# Patient Record
Sex: Male | Born: 1935 | Race: White | Hispanic: No | Marital: Married | Smoking: Former smoker
Health system: Southern US, Community
[De-identification: ages and names within clinical notes are randomized; demographics above are authoritative.]

## PROBLEM LIST (undated history)

## (undated) DIAGNOSIS — I2699 Other pulmonary embolism without acute cor pulmonale: Secondary | ICD-10-CM

## (undated) DIAGNOSIS — J449 Chronic obstructive pulmonary disease, unspecified: Secondary | ICD-10-CM

## (undated) DIAGNOSIS — E119 Type 2 diabetes mellitus without complications: Secondary | ICD-10-CM

## (undated) DIAGNOSIS — I2510 Atherosclerotic heart disease of native coronary artery without angina pectoris: Secondary | ICD-10-CM

## (undated) DIAGNOSIS — I82409 Acute embolism and thrombosis of unspecified deep veins of unspecified lower extremity: Secondary | ICD-10-CM

## (undated) DIAGNOSIS — N289 Disorder of kidney and ureter, unspecified: Secondary | ICD-10-CM

## (undated) DIAGNOSIS — E785 Hyperlipidemia, unspecified: Secondary | ICD-10-CM

## (undated) DIAGNOSIS — I10 Essential (primary) hypertension: Secondary | ICD-10-CM

## (undated) DIAGNOSIS — E663 Overweight: Secondary | ICD-10-CM

## (undated) DIAGNOSIS — R413 Other amnesia: Secondary | ICD-10-CM

## (undated) DIAGNOSIS — K219 Gastro-esophageal reflux disease without esophagitis: Secondary | ICD-10-CM

## (undated) DIAGNOSIS — R42 Dizziness and giddiness: Secondary | ICD-10-CM

## (undated) DIAGNOSIS — C3400 Malignant neoplasm of unspecified main bronchus: Secondary | ICD-10-CM

## (undated) DIAGNOSIS — E538 Deficiency of other specified B group vitamins: Secondary | ICD-10-CM

## (undated) DIAGNOSIS — Z9289 Personal history of other medical treatment: Secondary | ICD-10-CM

## (undated) HISTORY — DX: Overweight: E66.3

## (undated) HISTORY — PX: CORONARY ANGIOPLASTY WITH STENT PLACEMENT: SHX49

## (undated) HISTORY — DX: Atherosclerotic heart disease of native coronary artery without angina pectoris: I25.10

## (undated) HISTORY — DX: Malignant neoplasm of unspecified main bronchus: C34.00

## (undated) HISTORY — DX: Gastro-esophageal reflux disease without esophagitis: K21.9

## (undated) HISTORY — PX: CATARACT EXTRACTION W/ INTRAOCULAR LENS  IMPLANT, BILATERAL: SHX1307

## (undated) HISTORY — DX: Other amnesia: R41.3

## (undated) HISTORY — DX: Deficiency of other specified B group vitamins: E53.8

## (undated) HISTORY — DX: Hyperlipidemia, unspecified: E78.5

## (undated) HISTORY — DX: Essential (primary) hypertension: I10

## (undated) HISTORY — DX: Disorder of kidney and ureter, unspecified: N28.9

## (undated) HISTORY — DX: Chronic obstructive pulmonary disease, unspecified: J44.9

## (undated) HISTORY — DX: Dizziness and giddiness: R42

## (undated) SURGERY — EGD (ESOPHAGOGASTRODUODENOSCOPY)
Anesthesia: Moderate Sedation | Laterality: Left

---

## 1998-01-13 ENCOUNTER — Ambulatory Visit (HOSPITAL_COMMUNITY): Admission: RE | Admit: 1998-01-13 | Discharge: 1998-01-13 | Payer: Self-pay | Admitting: Family Medicine

## 2001-11-09 ENCOUNTER — Encounter: Payer: Self-pay | Admitting: Interventional Cardiology

## 2001-11-10 ENCOUNTER — Inpatient Hospital Stay (HOSPITAL_COMMUNITY): Admission: AD | Admit: 2001-11-10 | Discharge: 2001-11-11 | Payer: Self-pay | Admitting: Interventional Cardiology

## 2002-07-20 ENCOUNTER — Ambulatory Visit (HOSPITAL_COMMUNITY): Admission: RE | Admit: 2002-07-20 | Discharge: 2002-07-20 | Payer: Self-pay | Admitting: Interventional Cardiology

## 2004-09-11 ENCOUNTER — Ambulatory Visit: Payer: Self-pay | Admitting: Internal Medicine

## 2004-09-11 ENCOUNTER — Encounter: Payer: Self-pay | Admitting: Endocrinology

## 2004-09-11 LAB — CONVERTED CEMR LAB: PSA: 3.94 ng/mL

## 2004-09-13 ENCOUNTER — Ambulatory Visit (HOSPITAL_COMMUNITY): Admission: RE | Admit: 2004-09-13 | Discharge: 2004-09-13 | Payer: Self-pay | Admitting: Internal Medicine

## 2004-09-13 ENCOUNTER — Ambulatory Visit: Payer: Self-pay | Admitting: Pulmonary Disease

## 2004-10-09 ENCOUNTER — Ambulatory Visit: Payer: Self-pay | Admitting: Internal Medicine

## 2004-10-16 ENCOUNTER — Encounter: Admission: RE | Admit: 2004-10-16 | Discharge: 2005-01-14 | Payer: Self-pay | Admitting: Internal Medicine

## 2005-02-05 ENCOUNTER — Ambulatory Visit: Payer: Self-pay | Admitting: Internal Medicine

## 2005-02-12 ENCOUNTER — Ambulatory Visit: Payer: Self-pay | Admitting: Internal Medicine

## 2005-04-30 ENCOUNTER — Ambulatory Visit: Payer: Self-pay | Admitting: Internal Medicine

## 2005-05-09 ENCOUNTER — Ambulatory Visit: Payer: Self-pay | Admitting: Internal Medicine

## 2005-08-15 ENCOUNTER — Ambulatory Visit: Payer: Self-pay | Admitting: Internal Medicine

## 2006-01-10 ENCOUNTER — Ambulatory Visit: Payer: Self-pay | Admitting: Internal Medicine

## 2006-02-13 ENCOUNTER — Ambulatory Visit: Payer: Self-pay | Admitting: Internal Medicine

## 2006-03-27 ENCOUNTER — Ambulatory Visit: Payer: Self-pay | Admitting: Internal Medicine

## 2006-04-30 ENCOUNTER — Ambulatory Visit: Payer: Self-pay | Admitting: Internal Medicine

## 2006-06-24 ENCOUNTER — Ambulatory Visit: Payer: Self-pay | Admitting: Internal Medicine

## 2006-08-06 ENCOUNTER — Ambulatory Visit: Payer: Self-pay | Admitting: Internal Medicine

## 2006-08-06 LAB — CONVERTED CEMR LAB
ALT: 18 units/L (ref 0–40)
AST: 23 units/L (ref 0–37)
Alkaline Phosphatase: 78 units/L (ref 39–117)
BUN: 16 mg/dL (ref 6–23)
CO2: 28 meq/L (ref 19–32)
Calcium: 9.5 mg/dL (ref 8.4–10.5)
Chloride: 105 meq/L (ref 96–112)
Cholesterol: 252 mg/dL (ref 0–200)
Creatinine, Ser: 1.7 mg/dL — ABNORMAL HIGH (ref 0.4–1.5)
Direct LDL: 186.8 mg/dL
GFR calc Af Amer: 51 mL/min
GFR calc non Af Amer: 42 mL/min
Glucose, Bld: 123 mg/dL — ABNORMAL HIGH (ref 70–99)
HDL: 36.6 mg/dL — ABNORMAL LOW (ref >39.0)
Hgb A1c MFr Bld: 6.5 % — ABNORMAL HIGH (ref 4.6–6.0)
Potassium: 4.5 meq/L (ref 3.5–5.1)
Sodium: 140 meq/L (ref 135–145)
Total CHOL/HDL Ratio: 6.9
Triglycerides: 150 mg/dL — ABNORMAL HIGH (ref 0–149)
VLDL: 30 mg/dL (ref 0–40)

## 2006-11-06 ENCOUNTER — Ambulatory Visit: Payer: Self-pay | Admitting: Internal Medicine

## 2006-11-06 LAB — CONVERTED CEMR LAB
ALT: 14 units/L (ref 0–40)
AST: 17 units/L (ref 0–37)
Albumin: 3.7 g/dL (ref 3.5–5.2)
Alkaline Phosphatase: 77 units/L (ref 39–117)
Bilirubin, Direct: 0.2 mg/dL (ref 0.0–0.3)
Cholesterol: 135 mg/dL (ref 0–200)
HDL: 37.4 mg/dL — ABNORMAL LOW (ref >39.0)
LDL Cholesterol: 78 mg/dL (ref 0–99)
Total Bilirubin: 0.9 mg/dL (ref 0.3–1.2)
Total CHOL/HDL Ratio: 3.6
Total Protein: 6.9 g/dL (ref 6.0–8.3)
Triglycerides: 96 mg/dL (ref 0–149)
VLDL: 19 mg/dL (ref 0–40)

## 2007-02-04 ENCOUNTER — Encounter: Payer: Self-pay | Admitting: Endocrinology

## 2007-02-04 DIAGNOSIS — I2510 Atherosclerotic heart disease of native coronary artery without angina pectoris: Secondary | ICD-10-CM

## 2007-02-04 DIAGNOSIS — J439 Emphysema, unspecified: Secondary | ICD-10-CM

## 2007-02-04 DIAGNOSIS — E785 Hyperlipidemia, unspecified: Secondary | ICD-10-CM

## 2007-02-04 DIAGNOSIS — I10 Essential (primary) hypertension: Secondary | ICD-10-CM

## 2007-02-04 DIAGNOSIS — J45909 Unspecified asthma, uncomplicated: Secondary | ICD-10-CM | POA: Insufficient documentation

## 2007-02-04 DIAGNOSIS — E119 Type 2 diabetes mellitus without complications: Secondary | ICD-10-CM

## 2007-02-04 DIAGNOSIS — N259 Disorder resulting from impaired renal tubular function, unspecified: Secondary | ICD-10-CM | POA: Insufficient documentation

## 2007-03-12 ENCOUNTER — Ambulatory Visit: Payer: Self-pay | Admitting: Internal Medicine

## 2007-03-12 LAB — CONVERTED CEMR LAB
Calcium: 9.3 mg/dL (ref 8.4–10.5)
Cholesterol: 140 mg/dL (ref 0–200)
Eosinophils Absolute: 0.4 10*3/uL (ref 0.0–0.6)
Eosinophils Relative: 5 % (ref 0.0–5.0)
GFR calc Af Amer: 48 mL/min
GFR calc non Af Amer: 40 mL/min
Glucose, Bld: 126 mg/dL — ABNORMAL HIGH (ref 70–99)
HDL: 33.9 mg/dL — ABNORMAL LOW (ref >39.0)
Lymphocytes Relative: 19.3 % (ref 12.0–46.0)
MCV: 95.2 fL (ref 78.0–100.0)
Microalb Creat Ratio: 9.2 mg/g (ref 0.0–30.0)
Neutro Abs: 5.2 10*3/uL (ref 1.4–7.7)
Platelets: 217 10*3/uL (ref 150–400)
Sodium: 144 meq/L (ref 135–145)
TSH: 1.11 microintl units/mL (ref 0.35–5.50)
Triglycerides: 126 mg/dL (ref 0–149)
WBC: 7.5 10*3/uL (ref 4.5–10.5)

## 2007-03-25 ENCOUNTER — Ambulatory Visit: Payer: Self-pay | Admitting: Cardiology

## 2007-04-02 ENCOUNTER — Ambulatory Visit: Payer: Self-pay

## 2007-04-13 ENCOUNTER — Ambulatory Visit: Payer: Self-pay | Admitting: Internal Medicine

## 2007-05-19 ENCOUNTER — Inpatient Hospital Stay (HOSPITAL_COMMUNITY): Admission: RE | Admit: 2007-05-19 | Discharge: 2007-05-21 | Payer: Self-pay | Admitting: Surgery

## 2007-05-27 ENCOUNTER — Encounter: Payer: Self-pay | Admitting: Internal Medicine

## 2007-07-20 ENCOUNTER — Ambulatory Visit: Payer: Self-pay | Admitting: Internal Medicine

## 2007-07-20 DIAGNOSIS — R42 Dizziness and giddiness: Secondary | ICD-10-CM | POA: Insufficient documentation

## 2007-07-25 ENCOUNTER — Encounter: Payer: Self-pay | Admitting: Internal Medicine

## 2007-07-28 ENCOUNTER — Telehealth: Payer: Self-pay | Admitting: Internal Medicine

## 2007-08-03 ENCOUNTER — Encounter: Admission: RE | Admit: 2007-08-03 | Discharge: 2007-08-03 | Payer: Self-pay | Admitting: Internal Medicine

## 2007-08-06 ENCOUNTER — Encounter: Admission: RE | Admit: 2007-08-06 | Discharge: 2007-08-25 | Payer: Self-pay | Admitting: Internal Medicine

## 2007-08-11 ENCOUNTER — Ambulatory Visit: Payer: Self-pay | Admitting: Internal Medicine

## 2007-08-19 ENCOUNTER — Encounter: Payer: Self-pay | Admitting: Internal Medicine

## 2007-08-20 ENCOUNTER — Encounter: Payer: Self-pay | Admitting: Internal Medicine

## 2007-08-24 ENCOUNTER — Encounter: Payer: Self-pay | Admitting: Internal Medicine

## 2007-10-12 ENCOUNTER — Encounter: Payer: Self-pay | Admitting: Internal Medicine

## 2007-10-27 ENCOUNTER — Encounter: Payer: Self-pay | Admitting: Internal Medicine

## 2007-12-09 ENCOUNTER — Ambulatory Visit: Payer: Self-pay | Admitting: Internal Medicine

## 2007-12-09 LAB — CONVERTED CEMR LAB
BUN: 27 mg/dL — ABNORMAL HIGH (ref 6–23)
Calcium: 10 mg/dL (ref 8.4–10.5)
Cholesterol: 160 mg/dL (ref 0–200)
Creatinine, Ser: 1.7 mg/dL — ABNORMAL HIGH (ref 0.4–1.5)
GFR calc Af Amer: 51 mL/min
Glucose, Bld: 149 mg/dL — ABNORMAL HIGH (ref 70–99)
HDL: 38.5 mg/dL — ABNORMAL LOW (ref >39.0)
Triglycerides: 127 mg/dL (ref 0–149)
VLDL: 25 mg/dL (ref 0–40)

## 2007-12-15 ENCOUNTER — Ambulatory Visit: Payer: Self-pay | Admitting: Internal Medicine

## 2007-12-24 ENCOUNTER — Encounter: Payer: Self-pay | Admitting: Internal Medicine

## 2007-12-25 ENCOUNTER — Telehealth (INDEPENDENT_AMBULATORY_CARE_PROVIDER_SITE_OTHER): Payer: Self-pay | Admitting: *Deleted

## 2008-01-06 ENCOUNTER — Encounter: Payer: Self-pay | Admitting: Internal Medicine

## 2008-01-28 ENCOUNTER — Ambulatory Visit: Payer: Self-pay | Admitting: Internal Medicine

## 2008-01-28 DIAGNOSIS — R413 Other amnesia: Secondary | ICD-10-CM

## 2008-01-29 ENCOUNTER — Ambulatory Visit: Payer: Self-pay | Admitting: Internal Medicine

## 2008-02-01 ENCOUNTER — Telehealth: Payer: Self-pay | Admitting: Internal Medicine

## 2008-02-03 ENCOUNTER — Encounter: Payer: Self-pay | Admitting: Internal Medicine

## 2008-02-08 ENCOUNTER — Telehealth: Payer: Self-pay | Admitting: Internal Medicine

## 2008-02-17 ENCOUNTER — Ambulatory Visit: Payer: Self-pay | Admitting: Internal Medicine

## 2008-02-17 DIAGNOSIS — D510 Vitamin B12 deficiency anemia due to intrinsic factor deficiency: Secondary | ICD-10-CM

## 2008-02-24 ENCOUNTER — Ambulatory Visit: Payer: Self-pay | Admitting: Internal Medicine

## 2008-02-24 LAB — CONVERTED CEMR LAB
Chloride: 115 meq/L — ABNORMAL HIGH (ref 96–112)
GFR calc Af Amer: 51 mL/min
GFR calc non Af Amer: 42 mL/min
Potassium: 4.8 meq/L (ref 3.5–5.1)

## 2008-02-25 ENCOUNTER — Telehealth: Payer: Self-pay | Admitting: Internal Medicine

## 2008-02-29 ENCOUNTER — Ambulatory Visit: Payer: Self-pay | Admitting: Internal Medicine

## 2008-03-09 ENCOUNTER — Ambulatory Visit: Payer: Self-pay | Admitting: Internal Medicine

## 2008-03-10 ENCOUNTER — Telehealth: Payer: Self-pay | Admitting: Internal Medicine

## 2008-03-15 ENCOUNTER — Ambulatory Visit: Payer: Self-pay | Admitting: Internal Medicine

## 2008-03-15 LAB — CONVERTED CEMR LAB
CO2: 24 meq/L (ref 19–32)
Calcium: 9.5 mg/dL (ref 8.4–10.5)
Chloride: 116 meq/L — ABNORMAL HIGH (ref 96–112)
Creatinine,U: 166.9 mg/dL
GFR calc non Af Amer: 40 mL/min
Microalb, Ur: 0.6 mg/dL (ref 0.0–1.9)
Sodium: 142 meq/L (ref 135–145)

## 2008-03-16 ENCOUNTER — Telehealth: Payer: Self-pay | Admitting: Internal Medicine

## 2008-03-16 DIAGNOSIS — E875 Hyperkalemia: Secondary | ICD-10-CM

## 2008-03-22 ENCOUNTER — Ambulatory Visit: Payer: Self-pay | Admitting: Internal Medicine

## 2008-03-22 ENCOUNTER — Telehealth: Payer: Self-pay | Admitting: Internal Medicine

## 2008-03-22 LAB — CONVERTED CEMR LAB
Calcium: 9.5 mg/dL (ref 8.4–10.5)
Creatinine, Ser: 1.8 mg/dL — ABNORMAL HIGH (ref 0.4–1.5)
GFR calc Af Amer: 48 mL/min
Glucose, Bld: 143 mg/dL — ABNORMAL HIGH (ref 70–99)
Sodium: 142 meq/L (ref 135–145)

## 2008-04-06 ENCOUNTER — Ambulatory Visit: Payer: Self-pay | Admitting: Cardiology

## 2008-04-06 LAB — CONVERTED CEMR LAB
ALT: 35 units/L (ref 0–53)
AST: 28 units/L (ref 0–37)
Alkaline Phosphatase: 89 units/L (ref 39–117)
Bilirubin, Direct: 0.2 mg/dL (ref 0.0–0.3)
Direct LDL: 85.4 mg/dL
Total Bilirubin: 0.8 mg/dL (ref 0.3–1.2)
Total CHOL/HDL Ratio: 3.5
Triglycerides: 227 mg/dL (ref 0–149)

## 2008-04-19 ENCOUNTER — Ambulatory Visit: Payer: Self-pay | Admitting: Internal Medicine

## 2008-04-21 ENCOUNTER — Ambulatory Visit: Payer: Self-pay | Admitting: Cardiology

## 2008-04-21 LAB — CONVERTED CEMR LAB
HDL: 38.5 mg/dL — ABNORMAL LOW (ref >39.0)
LDL Cholesterol: 83 mg/dL (ref 0–99)
Total CHOL/HDL Ratio: 3.7
VLDL: 20 mg/dL (ref 0–40)

## 2008-06-28 ENCOUNTER — Encounter: Payer: Self-pay | Admitting: Internal Medicine

## 2008-07-11 ENCOUNTER — Ambulatory Visit: Payer: Self-pay | Admitting: Internal Medicine

## 2008-07-11 LAB — CONVERTED CEMR LAB
ALT: 20 units/L (ref 0–53)
BUN: 30 mg/dL — ABNORMAL HIGH (ref 6–23)
CO2: 25 meq/L (ref 19–32)
Chloride: 113 meq/L — ABNORMAL HIGH (ref 96–112)
Cholesterol: 153 mg/dL (ref 0–200)
Creatinine, Ser: 1.8 mg/dL — ABNORMAL HIGH (ref 0.4–1.5)
Glucose, Bld: 128 mg/dL — ABNORMAL HIGH (ref 70–99)
Potassium: 4.7 meq/L (ref 3.5–5.1)
Triglycerides: 109 mg/dL (ref 0–149)

## 2008-07-15 HISTORY — PX: INGUINAL HERNIA REPAIR: SUR1180

## 2008-07-22 ENCOUNTER — Ambulatory Visit: Payer: Self-pay | Admitting: Internal Medicine

## 2008-08-03 ENCOUNTER — Encounter: Payer: Self-pay | Admitting: Internal Medicine

## 2008-08-03 LAB — HM DIABETES EYE EXAM: HM Diabetic Eye Exam: NORMAL

## 2008-08-16 ENCOUNTER — Encounter: Payer: Self-pay | Admitting: Internal Medicine

## 2008-11-15 ENCOUNTER — Ambulatory Visit: Payer: Self-pay | Admitting: Internal Medicine

## 2008-11-15 LAB — CONVERTED CEMR LAB
Cholesterol: 149 mg/dL (ref 0–200)
LDL Cholesterol: 88 mg/dL (ref 0–99)
Triglycerides: 113 mg/dL (ref 0.0–149.0)
VLDL: 22.6 mg/dL (ref 0.0–40.0)

## 2008-11-21 ENCOUNTER — Ambulatory Visit: Payer: Self-pay | Admitting: Internal Medicine

## 2008-11-21 DIAGNOSIS — R5383 Other fatigue: Secondary | ICD-10-CM

## 2008-11-21 DIAGNOSIS — K219 Gastro-esophageal reflux disease without esophagitis: Secondary | ICD-10-CM

## 2008-11-21 DIAGNOSIS — R5381 Other malaise: Secondary | ICD-10-CM

## 2009-02-06 ENCOUNTER — Encounter (INDEPENDENT_AMBULATORY_CARE_PROVIDER_SITE_OTHER): Payer: Self-pay | Admitting: *Deleted

## 2009-03-08 ENCOUNTER — Encounter (INDEPENDENT_AMBULATORY_CARE_PROVIDER_SITE_OTHER): Payer: Self-pay | Admitting: *Deleted

## 2009-03-14 ENCOUNTER — Ambulatory Visit: Payer: Self-pay | Admitting: Internal Medicine

## 2009-03-14 LAB — CONVERTED CEMR LAB
ALT: 15 units/L (ref 0–53)
Albumin: 4.1 g/dL (ref 3.5–5.2)
Alkaline Phosphatase: 92 units/L (ref 39–117)
Basophils Relative: 1 % (ref 0–1)
Eosinophils Absolute: 0.3 10*3/uL (ref 0.0–0.7)
Eosinophils Relative: 4 % (ref 0–5)
HCT: 41.8 % (ref 39.0–52.0)
Indirect Bilirubin: 0.5 mg/dL (ref 0.0–0.9)
Lymphs Abs: 1.3 10*3/uL (ref 0.7–4.0)
MCHC: 33.7 g/dL (ref 30.0–36.0)
MCV: 92.5 fL (ref 78.0–100.0)
Monocytes Relative: 11 % (ref 3–12)
Neutrophils Relative %: 65 % (ref 43–77)
Platelets: 179 10*3/uL (ref 150–400)
Total Protein: 6.9 g/dL (ref 6.0–8.3)
WBC: 6.7 10*3/uL (ref 4.0–10.5)

## 2009-03-24 ENCOUNTER — Ambulatory Visit: Payer: Self-pay | Admitting: Internal Medicine

## 2009-03-28 ENCOUNTER — Encounter: Payer: Self-pay | Admitting: Internal Medicine

## 2009-05-26 ENCOUNTER — Telehealth: Payer: Self-pay | Admitting: Internal Medicine

## 2009-07-18 ENCOUNTER — Ambulatory Visit: Payer: Self-pay | Admitting: Internal Medicine

## 2009-07-18 LAB — CONVERTED CEMR LAB
AST: 15 units/L (ref 0–37)
Albumin: 4 g/dL (ref 3.5–5.2)
Alkaline Phosphatase: 103 units/L (ref 39–117)
BUN: 23 mg/dL (ref 6–23)
Bilirubin, Direct: 0.1 mg/dL (ref 0.0–0.3)
CO2: 20 meq/L (ref 19–32)
Calcium: 9.6 mg/dL (ref 8.4–10.5)
Chloride: 111 meq/L (ref 96–112)
Creatinine, Ser: 1.65 mg/dL — ABNORMAL HIGH (ref 0.40–1.50)
Glucose, Bld: 114 mg/dL — ABNORMAL HIGH (ref 70–99)
Indirect Bilirubin: 0.5 mg/dL (ref 0.0–0.9)
LDL Cholesterol: 100 mg/dL — ABNORMAL HIGH (ref 0–99)
Total Bilirubin: 0.6 mg/dL (ref 0.3–1.2)

## 2009-08-01 ENCOUNTER — Ambulatory Visit: Payer: Self-pay | Admitting: Internal Medicine

## 2009-08-02 ENCOUNTER — Encounter: Payer: Self-pay | Admitting: Cardiology

## 2009-08-02 ENCOUNTER — Ambulatory Visit: Payer: Self-pay | Admitting: Cardiology

## 2009-08-02 DIAGNOSIS — R0602 Shortness of breath: Secondary | ICD-10-CM

## 2009-11-02 ENCOUNTER — Encounter: Payer: Self-pay | Admitting: Internal Medicine

## 2009-11-02 LAB — CONVERTED CEMR LAB
Chloride: 110 meq/L (ref 96–112)
Creatinine, Ser: 2.01 mg/dL — ABNORMAL HIGH (ref 0.40–1.50)
Creatinine, Urine: 191.4 mg/dL
Hgb A1c MFr Bld: 7.1 % — ABNORMAL HIGH (ref <5.7)
Microalb Creat Ratio: 3.9 mg/g (ref 0.0–30.0)
Potassium: 4.8 meq/L (ref 3.5–5.3)
Sodium: 139 meq/L (ref 135–145)

## 2009-11-05 ENCOUNTER — Telehealth: Payer: Self-pay | Admitting: Internal Medicine

## 2009-11-09 ENCOUNTER — Ambulatory Visit: Payer: Self-pay | Admitting: Internal Medicine

## 2009-11-09 ENCOUNTER — Ambulatory Visit: Payer: Self-pay | Admitting: Radiology

## 2009-11-09 ENCOUNTER — Ambulatory Visit (HOSPITAL_BASED_OUTPATIENT_CLINIC_OR_DEPARTMENT_OTHER): Admission: RE | Admit: 2009-11-09 | Discharge: 2009-11-09 | Payer: Self-pay | Admitting: Internal Medicine

## 2009-11-10 ENCOUNTER — Telehealth: Payer: Self-pay | Admitting: Internal Medicine

## 2009-11-13 ENCOUNTER — Encounter (INDEPENDENT_AMBULATORY_CARE_PROVIDER_SITE_OTHER): Payer: Self-pay | Admitting: *Deleted

## 2009-11-23 ENCOUNTER — Encounter: Payer: Self-pay | Admitting: Internal Medicine

## 2009-11-23 LAB — CONVERTED CEMR LAB
CO2: 27 meq/L (ref 19–32)
Calcium: 9.3 mg/dL (ref 8.4–10.5)
Potassium: 4.9 meq/L (ref 3.5–5.3)
Sodium: 140 meq/L (ref 135–145)

## 2009-11-24 ENCOUNTER — Encounter: Payer: Self-pay | Admitting: Internal Medicine

## 2009-12-07 ENCOUNTER — Encounter: Payer: Self-pay | Admitting: Internal Medicine

## 2009-12-07 ENCOUNTER — Ambulatory Visit: Payer: Self-pay

## 2009-12-13 ENCOUNTER — Telehealth: Payer: Self-pay | Admitting: Internal Medicine

## 2010-01-30 LAB — CONVERTED CEMR LAB
CO2: 25 meq/L (ref 19–32)
Chloride: 104 meq/L (ref 96–112)
Creatinine, Ser: 1.83 mg/dL — ABNORMAL HIGH (ref 0.40–1.50)
Glucose, Bld: 129 mg/dL — ABNORMAL HIGH (ref 70–99)

## 2010-02-09 ENCOUNTER — Ambulatory Visit: Payer: Self-pay | Admitting: Internal Medicine

## 2010-02-13 ENCOUNTER — Telehealth: Payer: Self-pay | Admitting: Internal Medicine

## 2010-02-27 ENCOUNTER — Telehealth: Payer: Self-pay | Admitting: Internal Medicine

## 2010-03-02 ENCOUNTER — Encounter: Payer: Self-pay | Admitting: Internal Medicine

## 2010-03-05 ENCOUNTER — Encounter: Payer: Self-pay | Admitting: Internal Medicine

## 2010-03-09 ENCOUNTER — Ambulatory Visit: Payer: Self-pay | Admitting: Internal Medicine

## 2010-03-22 ENCOUNTER — Ambulatory Visit: Payer: Self-pay | Admitting: Cardiology

## 2010-03-22 ENCOUNTER — Ambulatory Visit: Payer: Self-pay

## 2010-03-22 ENCOUNTER — Encounter: Payer: Self-pay | Admitting: Internal Medicine

## 2010-03-22 ENCOUNTER — Ambulatory Visit (HOSPITAL_COMMUNITY): Admission: RE | Admit: 2010-03-22 | Discharge: 2010-03-22 | Payer: Self-pay | Admitting: Internal Medicine

## 2010-04-13 ENCOUNTER — Ambulatory Visit: Payer: Self-pay | Admitting: Internal Medicine

## 2010-04-16 ENCOUNTER — Telehealth: Payer: Self-pay | Admitting: Internal Medicine

## 2010-04-16 ENCOUNTER — Encounter: Payer: Self-pay | Admitting: Internal Medicine

## 2010-07-12 ENCOUNTER — Encounter: Payer: Self-pay | Admitting: Internal Medicine

## 2010-07-12 LAB — CONVERTED CEMR LAB
Calcium: 9.2 mg/dL (ref 8.4–10.5)
Glucose, Bld: 126 mg/dL — ABNORMAL HIGH (ref 70–99)
Hgb A1c MFr Bld: 7.1 % — ABNORMAL HIGH (ref <5.7)
Sodium: 141 meq/L (ref 135–145)

## 2010-07-20 ENCOUNTER — Ambulatory Visit
Admission: RE | Admit: 2010-07-20 | Discharge: 2010-07-20 | Payer: Self-pay | Source: Home / Self Care | Attending: Internal Medicine | Admitting: Internal Medicine

## 2010-08-12 LAB — CONVERTED CEMR LAB
Folate: 14.1 ng/mL
Vitamin B-12: 236 pg/mL (ref 211–911)

## 2010-08-14 NOTE — Miscellaneous (Signed)
Summary: [facility]  [facility]   Imported By: Lanelle Bal 03/09/2010 10:27:57  _____________________________________________________________________  External Attachment:    Type:   Image     Comment:   External Document

## 2010-08-14 NOTE — Miscellaneous (Signed)
Summary: Zostavax  Clinical Lists Changes  Observations: Added new observation of ZOSTAVAX: Zostavax (03/02/2010 16:22)      Immunization History:  Zostavax History:    Zostavax # 1:  zostavax (03/02/2010)

## 2010-08-14 NOTE — Letter (Signed)
Summary: Primary Care Consult Scheduled Letter  [place] [facility]  [address]   [place], [place] [address]   Phone: [phone]  Fax: [phone]      11/13/2009 MRN: [id number]  Gregory Farrell [address] [place], [place]  [address]    Dear Mr. [name],      We have scheduled an appointment for you.  At the recommendation of  Dr._YOO, we have scheduled you a DOPPLER @ [facility] on MAY 9,2011 at 8:30AM .  Their address [address], [place] The office phone number is [phone].  If this appointment day and time is not convenient for you, please feel free to call the office of the doctor you are being referred to at the number listed above and reschedule the appointment.    It is important for you to keep your scheduled appointments. We are here to make sure you are given good patient care. If you have questions or you have made changes to your appointment, please notify us at  [phone], ask for HELEN.    Thank you, Darral Dash Patient Care Coordinator [place] [facility]

## 2010-08-14 NOTE — Progress Notes (Signed)
Summary: BD Short Pen needles  Phone Note From Pharmacy Call back at [phone]   Caller: [facility] Summary of Call: [facility] and left [facility] message stating they do not stock the Relion short pen needles and wanted to know if it would okay to substitute the needles with BD short pen needles Initial call taken by: Glendell Docker CMA,  February 13, 2010 5:18 PM  Follow-up for Phone Call        Phone call completed Follow-up by: Glendell Docker CMA,  February 13, 2010 5:19 PM    New/Updated Medications: BD PEN NEEDLE SHORT U/F 31G X 8 MM MISC (INSULIN PEN NEEDLE) use for insulin injection two times a day Prescriptions: BD PEN NEEDLE SHORT U/F 31G X 8 MM MISC (INSULIN PEN NEEDLE) use for insulin injection two times a day  #100 x 1   Entered by:   Glendell Docker CMA   Authorized by:   D. Thomos Lemons DO   Signed by:   Glendell Docker CMA on 02/13/2010   Method used:   Electronically to        [facility] (retail)       [address]       [place], [place]  [address]       Ph: [phone]       Fax: [phone]   RxID:   [id number]

## 2010-08-14 NOTE — Assessment & Plan Note (Signed)
Summary: 3 month fu/dt   Vital Signs:  Patient profile:   77 year old male Weight:      195 pounds BMI:     28.90 O2 Sat:      95 % on Room air Temp:     97.8 degrees F oral Pulse rate:   86 / minute Pulse rhythm:   regular Resp:     16 per minute BP sitting:   110 / 70  (right arm) Cuff size:   large  Vitals Entered By: Glendell Docker CMA (February 09, 2010 3:51 PM)  O2 Flow:  Room air CC: Rm 3- Follow up disease management, Type 2 diabetes mellitus follow-up Is Patient Diabetic? Yes Did you bring your meter with you today? No Pain Assessment Patient in pain? no       Does patient need assistance? Functional Status Cook/clean   Primary Care Provider:  DThomos Lemons DO  CC:  Rm 3- Follow up disease management and Type 2 diabetes mellitus follow-up.  History of Present Illness:  Type 2 Diabetes Mellitus Follow-Up      This is a 77 year old man who presents for Type 2 diabetes mellitus follow-up.  The patient reports weight gain.  The patient denies the following symptoms: chest pain.  Since the last visit the patient reports poor dietary compliance, compliance with medications,  and not exercising regularly.  he notes difficulty with appetite control.    Preventive Screening-Counseling & Management  Alcohol-Tobacco     Smoking Status: quit  Allergies (verified): No Known Drug Allergies  Past History:  Past Medical History: Current Problems:  HYPERTENSION (ICD-401.9) HYPERLIPIDEMIA (ICD-272.4)  CORONARY ARTERY DISEASE (ICD-414.00) COPD (ICD-496)   RENAL INSUFFICIENCY (ICD-588.9) GERD (ICD-530.81) FATIGUE, CHRONIC (ICD-780.79) B12 DEFICIENCY (ICD-266.2) MEMORY LOSS (ICD-780.93) VERTIGO (ICD-780.4) DIABETES MELLITUS, TYPE II (ICD-250.00) ASTHMA (ICD-493.90)   Positional vertigo    Polyneuropathy - diabetes  Past Surgical History: PTCA/stent          Family History: Family History of CAD Male 1st degree relative <60 Family History Breast cancer 1st  degree relative <50 Family History Diabetes 1st degree relative         Social History:  Married Alcohol use-no   Former Smoker - smokeed  approximately 1 pack per day x 10 to 15 years  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   Lungs:  normal respiratory effort and normal breath sounds.   Heart:  normal rate, regular rhythm, and no gallop.   Extremities:  No lower extremity edema    Impression & Recommendations:  Problem # 1:  DIABETES MELLITUS, TYPE II (ICD-250.00) Assessment Deteriorated pt having difficulty with appetite control.  change to byetta  The following medications were removed from the medication list:    Januvia 50 Mg Tabs (Sitagliptin phosphate) ..... One by mouth qd His updated medication list for this problem includes:    Low-dose Aspirin 81 Mg Tabs (Aspirin) ..... One by mouth once daily    Lisinopril 10 Mg Tabs (Lisinopril) ..... One by mouth once daily    Byetta 5 Mcg Pen 5 Mcg/0.62ml Soln (Exenatide) ..... Inject two times a day before meals  Labs Reviewed: Creat: 1.83 (01/30/2010)     Last Eye Exam: normal (08/03/2008) Reviewed HgBA1c results: 7.3 (01/30/2010)  7.1 (11/02/2009)  Complete Medication List: 1)  Low-dose Aspirin 81 Mg Tabs (Aspirin) .... One by mouth once daily  2)  Amlodipine Besylate 10 Mg Tabs (Amlodipine besylate) .... One by mouth once daily 3)  Crestor 20 Mg Tabs (Rosuvastatin calcium) .... One by mouth once daily 4)  Omeprazole 20 Mg Tbec (Omeprazole) .... Take 1 tablet by mouth once a day 5)  Lisinopril 10 Mg Tabs (Lisinopril) .... One by mouth once daily 6)  Byetta 5 Mcg Pen 5 Mcg/0.52ml Soln (Exenatide) .... Inject two times a day before meals 7)  Bd Pen Needle Short U/f 31g X 8 Mm Misc (Insulin pen needle) .... Use for insulin injection two times a day  Patient Instructions: 1)  Please schedule a follow-up appointment in 1  month. Prescriptions: RELION PEN NEEDLES 31G X 8 MM MISC (INSULIN PEN NEEDLE) use two times a day as directed  #100 x 1   Entered and Authorized by:   D. Thomos Lemons DO   Signed by:   D. Thomos Lemons DO on 02/09/2010   Method used:   Electronically to        [facility] (retail)       [address]       [place], [place]  [address]       Ph: [phone]       Fax: [phone]   RxID:   [id number] CRESTOR 20 MG TABS (ROSUVASTATIN CALCIUM) one by mouth once daily  #30 x 5   Entered and Authorized by:   D. Thomos Lemons DO   Signed by:   D. Thomos Lemons DO on 02/09/2010   Method used:   Electronically to        [facility] (retail)       [address]       [place], [place]  [address]       Ph: [phone]       Fax: [phone]   RxID:   [id number]   Current Allergies (reviewed today): No known allergies

## 2010-08-14 NOTE — Miscellaneous (Signed)
Summary: Orders Update  Clinical Lists Changes  Orders: Added new Test order of Renal Artery Duplex (Renal Artery Duplex) - Signed 

## 2010-08-14 NOTE — Miscellaneous (Signed)
Summary: Orders Update  Clinical Lists Changes  Orders: Added new Test order of T-Basic Metabolic Panel ([id number]) - Signed  

## 2010-08-14 NOTE — Assessment & Plan Note (Signed)
Summary: 3 month follow up/mhf   Vital Signs:  Patient profile:   77 year old male Height:      69 inches Weight:      185 pounds BMI:     27.42 O2 Sat:      97 % on Room air Pulse rate:   97 / minute BP sitting:   118 / 70  (left arm) Cuff size:   regular  Vitals Entered By: Payton Spark CMA (November 09, 2009 4:05 PM)  O2 Flow:  Room air CC: f/u. Pt is taking lisinopril.   Primary Care Provider:  Dondra Spry DO  CC:  f/u. Pt is taking lisinopril.Marland Kitchen  History of Present Illness: 77 y/o white male with hx of DM II, htn and CRI for f/u.  recent Cr slightly worse than baseline 1 to 2 episodes of loose stools per week no dizziness normal urination he stopped acetazolmide as directed  DM II - stable.  A1c 7.1.   good compliance  Current Medications (verified): 1)  Low-Dose Aspirin 81 Mg  Tabs (Aspirin) .... One By Mouth Once Daily 2)  Norvasc 10 Mg  Tabs (Amlodipine Besylate) .... One Half Tab  By Mouth Once Daily 3)  Simvastatin 40 Mg Tabs (Simvastatin) .... One By Mouth Qpm 4)  Onetouch Ultra Test   Strp (Glucose Blood) .... For Once Daily Testing Ac Am Meal 5)  Onetouch Ultra Test   Strp (Glucose Blood) .... Test Blood Sugar Once Daily 6)  Januvia 50 Mg  Tabs (Sitagliptin Phosphate) .... One By Mouth Qd 7)  Omeprazole 20 Mg Tbec (Omeprazole) .... Take 1 Tablet By Mouth Once A Day 8)  Lisinopril 20 Mg Tabs (Lisinopril) .... Take 1/2 Tab By Mouth Once Daily  Allergies (verified): No Known Drug Allergies  Past History:  Past Medical History: Current Problems:  HYPERTENSION (ICD-401.9) HYPERLIPIDEMIA (ICD-272.4)  CORONARY ARTERY DISEASE (ICD-414.00) COPD (ICD-496)  RENAL INSUFFICIENCY (ICD-588.9) GERD (ICD-530.81) FATIGUE, CHRONIC (ICD-780.79) B12 DEFICIENCY (ICD-266.2) MEMORY LOSS (ICD-780.93) VERTIGO (ICD-780.4) DIABETES MELLITUS, TYPE II (ICD-250.00) ASTHMA (ICD-493.90)   Positional vertigo    Polyneuropathy - diabetes  Past Surgical  History: PTCA/stent         Family History: Family History of CAD Male 1st degree relative <60 Family History Breast cancer 1st degree relative <50 Family History Diabetes 1st degree relative        Social History:  Married Alcohol use-no  Former Smoker - smokeed  approximately 1 pack per day x 10 to 15 years  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    Physical Exam  General:  alert, well-developed, and well-nourished.   Lungs:  normal respiratory effort and normal breath sounds.   Heart:  normal rate, regular rhythm, and no gallop.   Extremities:  No lower extremity edema   Diabetes Management Exam:    Foot Exam (with socks and/or shoes not present):       Inspection:          Left foot: normal          Right foot: normal   Impression & Recommendations:  Problem # 1:  RENAL INSUFFICIENCY (ICD-588.9) 77 y/o with hx of CRI presumed secondary to Htn and DM II , had recent slight worsening of Cr.  no sign of dehydration.  DC acetazolamide check renal u/s  Orders: Ultrasound (Ultrasound)  Problem # 2:  DIABETES  MELLITUS, TYPE II (ICD-250.00) stable.  Maintain current medication regimen.  His updated medication list for this problem includes:    Low-dose Aspirin 81 Mg Tabs (Aspirin) ..... One by mouth once daily    Januvia 50 Mg Tabs (Sitagliptin phosphate) ..... One by mouth qd    Lisinopril 10 Mg Tabs (Lisinopril) ..... One by mouth once daily  Future Orders: T- Hemoglobin A1C ([id number]) ... 02/08/2010  Labs Reviewed: Creat: 2.01 (11/02/2009)     Last Eye Exam: normal (08/03/2008) Reviewed HgBA1c results: 7.1 (11/02/2009)  6.8 (07/18/2009)  Problem # 3:  HYPERTENSION (ICD-401.9) Pt will take 1/2 dose of lisinopril and 1/2 dose of amlodipine His updated medication list for this problem includes:    Amlodipine Besylate 10 Mg Tabs (Amlodipine besylate) .....  One by mouth once daily    Lisinopril 10 Mg Tabs (Lisinopril) ..... One by mouth once daily  Future Orders: T-Basic Metabolic Panel [id number]) ... 11/23/2009 T-Basic Metabolic Panel [id number]) ... 02/08/2010  Complete Medication List: 1)  Low-dose Aspirin 81 Mg Tabs (Aspirin) .... One by mouth once daily 2)  Amlodipine Besylate 10 Mg Tabs (Amlodipine besylate) .... One by mouth once daily 3)  Simvastatin 40 Mg Tabs (Simvastatin) .... One by mouth qpm 4)  Onetouch Ultra Test Strp (Glucose blood) .... For once daily testing ac am meal 5)  Onetouch Ultra Test Strp (Glucose blood) .... Test blood sugar once daily 6)  Januvia 50 Mg Tabs (Sitagliptin phosphate) .... One by mouth qd 7)  Omeprazole 20 Mg Tbec (Omeprazole) .... Take 1 tablet by mouth once a day 8)  Lisinopril 10 Mg Tabs (Lisinopril) .... One by mouth once daily   Patient Instructions: 1)  Please schedule a follow-up appointment in 3 months. Prescriptions: JANUVIA 50 MG  TABS (SITAGLIPTIN PHOSPHATE) one by mouth qd  #90 x 3   Entered and Authorized by:   D. Thomos Lemons DO   Signed by:   D. Thomos Lemons DO on 11/09/2009   Method used:   Electronically to        [facility] (retail)       [address]       [place], [place]  [address]       Ph: [phone]       Fax: [phone]   RxID:   [id number] SIMVASTATIN 40 MG TABS (SIMVASTATIN) one by mouth qpm  #90 x 3   Entered and Authorized by:   D. Thomos Lemons DO   Signed by:   D. Thomos Lemons DO on 11/09/2009   Method used:   Electronically to        [facility] (retail)       [address]       [place], [place]  [address]       Ph: [phone]       Fax: [phone]   RxID:   [id number] AMLODIPINE BESYLATE 10 MG TABS (AMLODIPINE BESYLATE) one by mouth once daily  #90 x 3   Entered and Authorized by:   D. Thomos Lemons DO   Signed by:   D. Thomos Lemons DO on 11/09/2009   Method used:    Electronically to        [facility] (retail)       [address]       [place], [place]  [address]       Ph: [phone]       Fax: [phone]   RxID:   [id number] LISINOPRIL 10 MG TABS (LISINOPRIL) one by mouth once daily  #90 x 3   Entered and Authorized by:   D. Thomos Lemons DO   Signed by:   D. Thomos Lemons DO on 11/09/2009   Method used:   Electronically to        [facility] (retail)       [address]       [place], [place]  [address]       Ph: [phone]       Fax: [phone]   RxID:   [id number]

## 2010-08-14 NOTE — Miscellaneous (Signed)
Summary: Lab Orders  Clinical Lists Changes  Orders: Added new Test order of T-Basic Metabolic Panel [id number]) - Signed Added new Test order of T- Hemoglobin A1C ([id number]) - Signed Added new Test order of T-Urine Microalbumin w/creat. ratio [id number]) - Signed Added new Test order of T-Vitamin B12 [id number]) - Signed

## 2010-08-14 NOTE — Medication Information (Signed)
Summary: Victoza Approved/[facility]  [facility]   Imported By: Sherian Rein 04/25/2010 14:51:09  _____________________________________________________________________  External Attachment:    Type:   Image     Comment:   External Document

## 2010-08-14 NOTE — Progress Notes (Signed)
Summary: Lab Results  Phone Note Outgoing Call   Summary of Call: call pt - kidney function is slightly worse.   I advise pt stop zestril and acetazolimide Initial call taken by: D. Thomos Lemons DO,  November 05, 2009 4:24 PM  Follow-up for Phone Call        LM to [facility] on home # Diane Tomerlin  November 06, 2009 8:36 AM  SW pt, he states he is not taking zestril Diane Tomerlin  November 06, 2009 11:22 AM  Additional Follow-up for Phone Call Additional follow up Details #1::        is pt taking acetazolamide? has pt been taking otc nsaids? Additional Follow-up by: D. Thomos Lemons DO,  November 06, 2009 2:31 PM    Additional Follow-up for Phone Call Additional follow up Details #2::    call placed to patient at [phone] for medication clarification, no anwer, voice message left to return call Follow-up by: Glendell Docker CMA,  November 06, 2009 2:59 PM  Additional Follow-up for Phone Call Additional follow up Details #3:: Details for Additional Follow-up Action Taken: Pt called back, pls call him at [phone] Diane Tomerlin  November 06, 2009 3:27 PM  call returned to patient at [phone], wife states patient is at work, she has provided the work number [phone], call placed to patients work number, patient states that he has been taking the Acetazolamide, however he has not been taking any anti inflammatory medication. He was advised to stop taking the Acetazolamide per Dr Artist Pais. Patient has a follow up appointment on 11/09/2009 @ 3:45p Glendell Docker CMA  November 07, 2009 9:37 AM

## 2010-08-14 NOTE — Progress Notes (Signed)
Summary: renal u/s  Phone Note Outgoing Call   Summary of Call: call pt - renal doppler showed normal renal arteries.  I will discuss renal u/s study in detail at next OV Initial call taken by: D. Thomos Lemons DO,  December 13, 2009 12:24 PM  Follow-up for Phone Call        Pt advised of results per Dr. Olegario Messier instructions.  Mervin Kung CMA  December 13, 2009 4:19 PM

## 2010-08-14 NOTE — Assessment & Plan Note (Signed)
Summary: 1 MONTH FU/DT   Vital Signs:  Patient profile:   77 year old male Height:      69 inches Weight:      188.75 pounds BMI:     27.97 Temp:     97.7 degrees F oral Pulse rate:   78 / minute Pulse rhythm:   regular Resp:     18 per minute BP sitting:   130 / 67  (left arm) Cuff size:   regular  Vitals Entered By: Glendell Docker CMA (April 13, 2010 3:44 PM) CC: 1 month follow up Is Patient Diabetic? Yes Did you bring your meter with you today? Yes Pain Assessment Patient in pain? no      Comments Victoza is working well, wouldl like to continue taking   Primary Care Provider:  D. Thomos Lemons DO  CC:  1 month follow up.  History of Present Illness:  Type 1 Diabetes Mellitus Follow-Up      This is a 77 year old man who presents for Type 1 diabetes mellitus follow-up.  The patient denies polyuria, polydipsia, self managed hypoglycemia, hypoglycemia requiring help, and weight gain.  The patient denies the following symptoms: chest pain.  Since the last visit the patient reports good dietary compliance and compliance with medications.  good response to victoza.    Preventive Screening-Counseling & Management  Alcohol-Tobacco     Smoking Status: quit  Allergies: 1)  Byetta 5 Mcg Pen (Exenatide)  Past History:  Past Medical History: Current Problems:  HYPERTENSION (ICD-401.9)  HYPERLIPIDEMIA (ICD-272.4)  CAD    COPD (ICD-496)   RENAL INSUFFICIENCY (ICD-588.9) GERD (ICD-530.81) FATIGUE, CHRONIC (ICD-780.79) B12 DEFICIENCY (ICD-266.2) MEMORY LOSS (ICD-780.93) VERTIGO (ICD-780.4) DIABETES MELLITUS, TYPE II (ICD-250.00) ASTHMA (ICD-493.90)   Positional vertigo    Polyneuropathy - diabetes  Past Surgical History: PTCA/stent            Family History: Family History of CAD Male 1st degree relative <60 Family History Breast cancer 1st degree relative <50 Family History Diabetes 1st degree relative           Social History:  Married  Alcohol  use-no     Former Smoker - smoked  approximately 1 pack per day x 10 to 15 years  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   Neck:  supple and no masses.  no carotid bruits.   Lungs:  normal respiratory effort and normal breath sounds.   Heart:  normal rate, regular rhythm, and no gallop.     Impression & Recommendations:  Problem # 1:  DIABETES MELLITUS, TYPE II (ICD-250.00) Assessment Improved good reponse to victoza.  Maintain current medication regimen.  His updated medication list for this problem includes:    Low-dose Aspirin 81 Mg Tabs (Aspirin) ..... One by mouth once daily    Lisinopril 10 Mg Tabs (Lisinopril) ..... One by mouth once daily    Victoza 18 Mg/65ml Soln (Liraglutide) .Marland Kitchen... 1.2 mg once daily  Labs Reviewed: Creat: 1.83 (01/30/2010)     Last Eye Exam: normal (08/03/2008) Reviewed HgBA1c results: 7.3 (01/30/2010)  7.1 (11/02/2009)  Complete Medication List: 1)  Low-dose Aspirin 81 Mg Tabs (Aspirin) .... One by mouth once daily 2)  Amlodipine Besylate 10 Mg Tabs (Amlodipine besylate) .... One by mouth once daily 3)  Crestor 20 Mg Tabs (Rosuvastatin calcium) .... One by mouth once daily 4)  Omeprazole 20 Mg Tbec (  Omeprazole) .... Take 1 tablet by mouth once a day 5)  Lisinopril 10 Mg Tabs (Lisinopril) .... One by mouth once daily 6)  Bd Pen Needle Short U/f 31g X 8 Mm Misc (Insulin pen needle) .... Use for insulin injection two times a day 7)  Victoza 18 Mg/52ml Soln (Liraglutide) .... 1.2 mg once daily  Patient Instructions: 1)  Please schedule a follow-up appointment in 3 months. 2)  BMP prior to visit, ICD-9: 401.9 3)  HbgA1C prior to visit, ICD-9: 250.00 4)  Please return for lab work one (1) week before your next appointment.  Prescriptions: VICTOZA 18 MG/3ML SOLN (LIRAGLUTIDE) 1.2 mg once daily  #1 x 3   Entered and Authorized by:   D. Thomos Lemons DO    Signed by:   D. Thomos Lemons DO on 04/13/2010   Method used:   Electronically to        [facility] (retail)       [address]       [place], [place]  [address]       Ph: [phone]       Fax: [phone]   RxID:   [id number]     Current Allergies (reviewed today): BYETTA 5 MCG PEN (EXENATIDE)

## 2010-08-14 NOTE — Assessment & Plan Note (Signed)
Summary: [facility]  Medications Added OMEPRAZOLE 20 MG TBEC (OMEPRAZOLE) Take 1 tablet by mouth once a day      Allergies Added: NKDA  Visit Type:  Follow-up Primary Provider:  Dondra Spry Farrell  CC:  Sob.  History of Present Illness: Gregory Farrell is a very pleasant  gentleman who I have followed in the past for coronary artery disease.  His last catheterization was performed in January 2004, his ejection fraction was 65%.  There was a 30-50% left main.  The Cypher stent in the LAD was widely patent.  There was an eccentric area of stenosis of 50-70% in the LAD that was unchanged from previous.  The distal obtuse marginal at its origin from a bifurcation with his second obtuse marginal was totally occluded, both filled via left-to-left collaterals and there was also right-to-left collaterals.  The previous OM at the site of PCI was total.  The right coronary artery had a 50% lesion.  His last Myoview was performed on April 02, 2007.  At that time, his ejection fraction was 65%.  There was very mild ischemia in the inferior wall and probable soft tissue attenuation as well.  Also note, we did perform an abdominal ultrasound on April 02, 2007, that showed no significant aneurysm.  Since I saw him in September of 2009  he does have dyspnea at the more extreme activities, but not with routine activities.  The dyspnea is relieved with rest. It is not associated with chest pain.  There was no orthopnea, PND, pedal edema, palpitations, presyncope, syncope or exertional chest pain.   Current Medications (verified): 1)  Low-Dose Aspirin 81 Mg  Tabs (Aspirin) .... One By Mouth Once Daily 2)  Norvasc 10 Mg  Tabs (Amlodipine Besylate) .... One Half Tab  By Mouth Once Daily 3)  Simvastatin 40 Mg Tabs (Simvastatin) .... One By Mouth Qpm 4)  Onetouch Ultra Test   Strp (Glucose Blood) .... For Once Daily Testing Ac Am Meal 5)  Onetouch Ultra Test   Strp (Glucose Blood) .... Test Blood Sugar Once  Daily 6)  Zestril 20 Mg  Tabs (Lisinopril) .... Take 1/2  Tablet By Mouth Once A Day 7)  Januvia 50 Mg  Tabs (Sitagliptin Phosphate) .... One By Mouth Qd 8)  Acetazolamide 125 Mg  Tabs (Acetazolamide) .... One By Mouth Two Times A Day 9)  Omeprazole 20 Mg Tbec (Omeprazole) .... Take 1 Tablet By Mouth Once A Day  Allergies (verified): No Known Drug Allergies  Past History:  Past Medical History: Current Problems:  HYPERTENSION (ICD-401.9) HYPERLIPIDEMIA (ICD-272.4) CORONARY ARTERY DISEASE (ICD-414.00) COPD (ICD-496)  RENAL INSUFFICIENCY (ICD-588.9) GERD (ICD-530.81) FATIGUE, CHRONIC (ICD-780.79) B12 DEFICIENCY (ICD-266.2) MEMORY LOSS (ICD-780.93) VERTIGO (ICD-780.4) DIABETES MELLITUS, TYPE II (ICD-250.00) ASTHMA (ICD-493.90)   Positional vertigo    Polyneuropathy - diabetes  Social History: Reviewed history from 08/01/2009 and no changes required.  Married Alcohol use-no Former Smoker - smokeed  approximately 1 pack per day x 10 to 15 years  Retired - worked in Teaching laboratory technician for [employer]       Review of Systems       no fevers or chills, productive cough, hemoptysis, dysphasia, odynophagia, melena, hematochezia, dysuria, hematuria, rash, seizure activity, orthopnea, PND, pedal edema, claudication. Remaining systems are negative.   Vital Signs:  Patient profile:   77 year old male Height:      69 inches Weight:      189.25 pounds BMI:     28.05 Pulse rate:  74 / minute Pulse rhythm:   regular Resp:     18 per minute BP sitting:   110 / 64  (left arm) Cuff size:   regular  Vitals Entered By: Vikki Ports (August 02, 2009 12:30 PM)  Physical Exam  General:  Well-developed well-nourished in no acute distress.  Skin is warm and dry.  HEENT is normal.  Neck is supple. No thyromegaly.  Chest is clear to auscultation with normal expansion.  Cardiovascular exam is regular rate and rhythm.  Abdominal exam nontender or distended. No masses  palpated. Extremities show no edema. neuro grossly intact    EKG  Procedure date:  08/02/2009  Findings:      Sinus rhythm at a rate of 74. Right bundle branch block. Possible prior lateral infarct. First degree AV block.  Impression & Recommendations:  Problem # 1:  DYSPNEA (ICD-786.05) This may be deconditioning. However it has been almost 2-1/2 years since his last functional study. We'll plan a stress Myoview for risk stratification. His updated medication list for this problem includes:    Low-dose Aspirin 81 Mg Tabs (Aspirin) ..... One by mouth once daily    Norvasc 10 Mg Tabs (Amlodipine besylate) ..... One half tab  by mouth once daily    Zestril 20 Mg Tabs (Lisinopril) .Marland Kitchen... Take 1/2  tablet by mouth once a day    Acetazolamide 125 Mg Tabs (Acetazolamide) ..... One by mouth two times a day  Problem # 2:  HYPERTENSION (ICD-401.9) Blood pressure controlled on present medications. Will continue. Renal function is monitored by his primary care. His updated medication list for this problem includes:    Low-dose Aspirin 81 Mg Tabs (Aspirin) ..... One by mouth once daily    Norvasc 10 Mg Tabs (Amlodipine besylate) ..... One half tab  by mouth once daily    Zestril 20 Mg Tabs (Lisinopril) .Marland Kitchen... Take 1/2  tablet by mouth once a day    Acetazolamide 125 Mg Tabs (Acetazolamide) ..... One by mouth two times a day  Problem # 3:  HYPERLIPIDEMIA (ICD-272.4) Continue statin. Note recent LDL not at goal (100). However due to financial concerns he would prefer to remain on Zocor. Recent liver functions normal. His updated medication list for this problem includes:    Simvastatin 40 Mg Tabs (Simvastatin) ..... One by mouth qpm  Problem # 4:  CORONARY ARTERY DISEASE (ICD-414.00) Continue aspirin, ACE inhibitor and statin. His updated medication list for this problem includes:    Low-dose Aspirin 81 Mg Tabs (Aspirin) ..... One by mouth once daily    Norvasc 10 Mg Tabs (Amlodipine  besylate) ..... One half tab  by mouth once daily    Zestril 20 Mg Tabs (Lisinopril) .Marland Kitchen... Take 1/2  tablet by mouth once a day  Orders: Nuclear Stress Test (Nuc Stress Test)  Problem # 5:  RENAL INSUFFICIENCY (ICD-588.9) Renal function monitored by his primary care.  Problem # 6:  COPD (ICD-496)  Problem # 7:  GERD (ICD-530.81)  His updated medication list for this problem includes:    Omeprazole 20 Mg Tbec (Omeprazole) .Marland Kitchen... Take 1 tablet by mouth once a day  Problem # 8:  DIABETES MELLITUS, TYPE II (ICD-250.00)  His updated medication list for this problem includes:    Low-dose Aspirin 81 Mg Tabs (Aspirin) ..... One by mouth once daily    Zestril 20 Mg Tabs (Lisinopril) .Marland Kitchen... Take 1/2  tablet by mouth once a day    Januvia 50 Mg Tabs (Sitagliptin phosphate) .Marland KitchenMarland KitchenMarland KitchenMarland Kitchen  One by mouth qd  Patient Instructions: 1)  Your physician recommends that you schedule a follow-up appointment in: ONE YEAR 2)  Your physician has requested that you have an LEXISCAN myoview.  For further information please visit [email or web address]  Please follow instruction sheet, as given.    VITAL SIGNS:  Patient Profile:   77 Years Old Male Height:     69 inches Weight:      189.25 pounds BMI:     28.05 BP sitting:   110 / 64 Pulse rate:   74 Pulse rhythm:   regular Resp:     18 per minute

## 2010-08-14 NOTE — Progress Notes (Signed)
Summary: Victoza approved for 1 yr by [facility]  Phone Note Outgoing Call   Call placed by: Darral Dash,  April 16, 2010 3:01 PM Call placed to: [facility] Summary of Call: Called [facility] for Prior Auth   on [place] ,Fred[place] approved med for 21yr faxed form to [employer]  Initial call taken by: Darral Dash,  April 16, 2010 3:03 PM

## 2010-08-14 NOTE — Progress Notes (Signed)
  Phone Note Other Incoming   Summary of Call: pt requesting new rx for shingles vaccine Initial call taken by: D. Thomos Lemons DO,  February 27, 2010 1:52 PM    New/Updated Medications: ZOSTAVAX 83419 UNT/0.65ML SOLR (ZOSTER VACCINE LIVE) administer vaccine x 1 Prescriptions: ZOSTAVAX 62229 UNT/0.65ML SOLR (ZOSTER VACCINE LIVE) administer vaccine x 1  #1 x 0   Entered and Authorized by:   D. Thomos Lemons DO   Signed by:   D. Thomos Lemons DO on 02/27/2010   Method used:   Print then Give to Patient   RxID:   [id number]

## 2010-08-14 NOTE — Assessment & Plan Note (Signed)
Summary: 1 month fu/dt   Vital Signs:  Patient profile:   77 year old male Height:      69 inches Weight:      196.50 pounds BMI:     29.12 O2 Sat:      96 % on Room air Temp:     98.0 degrees F oral Pulse rate:   76 / minute Pulse rhythm:   regular Resp:     16 per minute BP sitting:   100 / 60  (right arm) Cuff size:   large  Vitals Entered By: Glendell Docker CMA (March 09, 2010 3:06 PM)  O2 Flow:  Room air CC: 1 Month Follow up  Is Patient Diabetic? Yes Pain Assessment Patient in pain? no        Primary Care Provider:  Dondra Spry DO  CC:  1 Month Follow up .  History of Present Illness: 77 y/o white male with diabetes and wt gain for f/u he tried byetta but had to stop due to severe nausea  pt also c/o dyspnea. gets winded with walking short distances no chest pain    Preventive Screening-Counseling & Management  Alcohol-Tobacco     Smoking Status: quit  Allergies (verified): 1)  Byetta 5 Mcg Pen (Exenatide)  Past History:  Past Medical History: Current Problems:  HYPERTENSION (ICD-401.9)  HYPERLIPIDEMIA (ICD-272.4) CAD  COPD (ICD-496)   RENAL INSUFFICIENCY (ICD-588.9) GERD (ICD-530.81) FATIGUE, CHRONIC (ICD-780.79) B12 DEFICIENCY (ICD-266.2) MEMORY LOSS (ICD-780.93) VERTIGO (ICD-780.4) DIABETES MELLITUS, TYPE II (ICD-250.00) ASTHMA (ICD-493.90)   Positional vertigo    Polyneuropathy - diabetes  Family History: Family History of CAD Male 1st degree relative <60 Family History Breast cancer 1st degree relative <50 Family History Diabetes 1st degree relative          Social History:  Married Alcohol use-no    Former Smoker - smoked  approximately 1 pack per day x 10 to 15 years  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   Lungs:  normal respiratory effort and normal breath sounds.   Heart:  normal rate, regular rhythm, and no  gallop.   Extremities:  No lower extremity edema    Impression & Recommendations:  Problem # 1:  DYSPNEA (ICD-786.05) 77 y/o with hx of CAD with cypher stent to LAD reports worsening dyspnea.   his Myoview was performed on April 02, 2007.  At that time, his ejection fraction was 65%.   no chest pain.  question diastolic dysfuction.  check 2 D Echo Orders: Echo Referral (Echo)  Problem # 2:  DIABETES MELLITUS, TYPE II (ICD-250.00) he could not tolerate byetta.  trial of victoza The following medications were removed from the medication list:    Byetta 5 Mcg Pen 5 Mcg/0.84ml Soln (Exenatide) ..... Inject two times a day before meals His updated medication list for this problem includes:    Low-dose Aspirin 81 Mg Tabs (Aspirin) ..... One by mouth once daily    Lisinopril 10 Mg Tabs (Lisinopril) ..... One by mouth once daily    Victoza 18 Mg/73ml Soln (Liraglutide) ..... Marland Kitchen6 mg once daily  Labs Reviewed: Creat: 1.83 (01/30/2010)     Last Eye Exam: normal (08/03/2008) Reviewed HgBA1c results: 7.3 (01/30/2010)  7.1 (11/02/2009)  Complete Medication List: 1)  Low-dose Aspirin 81 Mg Tabs (Aspirin) .... One by mouth once daily 2)  Amlodipine Besylate 10 Mg Tabs (Amlodipine besylate) .Marland KitchenMarland KitchenMarland Kitchen  One by mouth once daily 3)  Crestor 20 Mg Tabs (Rosuvastatin calcium) .... One by mouth once daily 4)  Omeprazole 20 Mg Tbec (Omeprazole) .... Take 1 tablet by mouth once a day 5)  Lisinopril 10 Mg Tabs (Lisinopril) .... One by mouth once daily 6)  Bd Pen Needle Short U/f 31g X 8 Mm Misc (Insulin pen needle) .... Use for insulin injection two times a day 7)  Victoza 18 Mg/76ml Soln (Liraglutide) .... Marland Kitchen6 mg once daily  Other Orders: Influenza Vaccine MCR ([id number])  Patient Instructions: 1)  Please schedule a follow-up appointment in 2 months.  Current Allergies (reviewed today): BYETTA 5 MCG PEN (EXENATIDE)   Immunizations Administered:  Influenza Vaccine # 1:    Vaccine Type: Fluvax MCR     Site: left deltoid    Mfr: GlaxoSmithKline    Dose: 0.5 ml    Route: IM    Given by: Glendell Docker CMA    Exp. Date: 01/12/2011    Lot #: [id number]    VIS given: 02/06/2010  Flu Vaccine Consent Questions:    Do you have a history of severe allergic reactions to this vaccine? no    Any prior history of allergic reactions to egg and/or gelatin? no    Do you have a sensitivity to the preservative Thimersol? no    Do you have a past history of Guillan-Barre Syndrome? no    Do you currently have an acute febrile illness? no    Have you ever had a severe reaction to latex? no    Vaccine information given and explained to patient? yes

## 2010-08-14 NOTE — Progress Notes (Signed)
Summary: Results  Phone Note Outgoing Call   Summary of Call: call pt - renal ultrasound shows smaller right kidney.  this may indicate right renal artery stenosis (blockage).  I suggest renal artery doppler.  see order Initial call taken by: D. Thomos Lemons DO,  November 10, 2009 6:00 PM  Follow-up for Phone Call        Patient notified. Follow-up by: Lucious Groves,  Nov 13, 2009 12:06 PM

## 2010-08-14 NOTE — Assessment & Plan Note (Signed)
Summary: 4 mon f/u/hea   Vital Signs:  Patient profile:   77 year old male Weight:      187.50 pounds BMI:     27.79 O2 Sat:      99 % on Room air Temp:     97.7 degrees F oral Pulse rate:   80 / minute Pulse rhythm:   regular Resp:     18 per minute BP sitting:   124 / 70  (right arm) Cuff size:   regular  Vitals Entered By: Glendell Docker CMA (August 01, 2009 3:51 PM)  O2 Flow:  Room air  Primary Care Provider:  D. Thomos Lemons DO  CC:  4 Month Follow Up  and Type 2 diabetes mellitus follow-up.  History of Present Illness: 4 Month Follow up disease management  Type 2 Diabetes Mellitus Follow-Up      This is a 77 year old man who presents for Type 2 diabetes mellitus follow-up.  The patient reports weight gain, but denies self managed hypoglycemia and hypoglycemia requiring help.  The patient denies the following symptoms: chest pain.  Since the last visit the patient reports poor dietary compliance, compliance with medications, and not monitoring blood glucose.    hyperlipidemia - good compliance.  simvastatin keep him out of donut hole  htn - stable.  Allergies (verified): No Known Drug Allergies  Past History:  Past Medical History: Current Problems:  HYPERTENSION (ICD-401.9) HYPERLIPIDEMIA (ICD-272.4) CORONARY ARTERY DISEASE (ICD-414.00) COPD (ICD-496)  RENAL INSUFFICIENCY (ICD-588.9) GERD (ICD-530.81) FATIGUE, CHRONIC (ICD-780.79) HYPERKALEMIA (ICD-276.7) B12 DEFICIENCY (ICD-266.2) MEMORY LOSS (ICD-780.93) FAMILY HISTORY DIABETES 1ST DEGREE RELATIVE (ICD-V18.0) FAMILY HISTORY BREAST CANCER 1ST DEGREE RELATIVE <50 (ICD-V16.3) FAMILY HISTORY OF CAD MALE 1ST DEGREE RELATIVE <60 (ICD-V16.49) VERTIGO (ICD-780.4) DIABETES MELLITUS, TYPE II (ICD-250.00) ASTHMA (ICD-493.90)   Positional vertigo    Polyneuropathy - diabetes  Past Surgical History: PTCA/stent        Family History: Family History of CAD Male 1st degree relative <60 Family History  Breast cancer 1st degree relative <50 Family History Diabetes 1st degree relative       Social History:  Married Alcohol use-no Former Smoker - smokes approximately 1 pack per day x 10 to 15 years  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   Neck:  supple and no masses.  no carotid bruits.   Lungs:  normal respiratory effort and normal breath sounds.   Heart:  normal rate, regular rhythm, and no gallop.   Abdomen:  soft, non-tender, and normal bowel sounds.   Extremities:  No lower extremity edema  Neurologic:  cranial nerves II-XII intact and gait normal.    Diabetes Management Exam:    Foot Exam (with socks and/or shoes not present):       Inspection:          Left foot: abnormal             Comments: callus ball of foot          Right foot: abnormal             Comments: callus ball of foot   Impression & Recommendations:  Problem # 1:  DIABETES MELLITUS, TYPE II (ICD-250.00) A1c improved but wt gain.   he has had difficulty controling appetite over the holidays.  he tried byetta in the past but it caused nausea and he did not like injecting himself.  he  defers trial of victoza.  rx for diabetic shoes provided due to callus formation.  he has normal circulation.  no sign neuropathy.    His updated medication list for this problem includes:    Low-dose Aspirin 81 Mg Tabs (Aspirin) ..... One by mouth once daily    Zestril 20 Mg Tabs (Lisinopril) .Marland Kitchen... Take 1/2  tablet by mouth once a day    Januvia 50 Mg Tabs (Sitagliptin phosphate) ..... One by mouth qd  Labs Reviewed: Creat: 1.65 (07/18/2009)     Last Eye Exam: normal (08/03/2008) Reviewed HgBA1c results: 6.8 (07/18/2009)  7.1 (11/15/2008)  Problem # 2:  HYPERTENSION (ICD-401.9) well controlled.  Maintain current medication regimen.  His updated medication list for this problem includes:    Norvasc 10 Mg Tabs  (Amlodipine besylate) ..... One half tab  by mouth once daily    Zestril 20 Mg Tabs (Lisinopril) .Marland Kitchen... Take 1/2  tablet by mouth once a day    Acetazolamide 125 Mg Tabs (Acetazolamide) ..... One by mouth two times a day  BP today: 124/70 Prior BP: 122/68 (03/24/2009)  Labs Reviewed: K+: 4.7 (07/18/2009) Creat: : 1.65 (07/18/2009)   Chol: 167 (07/18/2009)   HDL: 38 (07/18/2009)   LDL: 100 (07/18/2009)   TG: 144 (07/18/2009)  Problem # 3:  HYPERLIPIDEMIA (ICD-272.4) LDL slightly higher since changed from lipitor.  additional dietary measures discussed.  he would like to continue simvastatin due to cost reasons His updated medication list for this problem includes:    Simvastatin 40 Mg Tabs (Simvastatin) ..... One by mouth qpm  Labs Reviewed: SGOT: 15 (07/18/2009)   SGPT: 14 (07/18/2009)   HDL:38 (07/18/2009), 38.00 (11/15/2008)  LDL:100 (07/18/2009), 88 (16/04/9603)  Chol:167 (07/18/2009), 149 (11/15/2008)  Trig:144 (07/18/2009), 113.0 (11/15/2008)  Complete Medication List: 1)  Low-dose Aspirin 81 Mg Tabs (Aspirin) .... One by mouth once daily 2)  Norvasc 10 Mg Tabs (Amlodipine besylate) .... One half tab  by mouth once daily 3)  Simvastatin 40 Mg Tabs (Simvastatin) .... One by mouth qpm 4)  Onetouch Ultra Test Strp (Glucose blood) .... For once daily testing ac am meal 5)  Onetouch Ultra Test Strp (Glucose blood) .... Test blood sugar once daily 6)  Zestril 20 Mg Tabs (Lisinopril) .... Take 1/2  tablet by mouth once a day 7)  Januvia 50 Mg Tabs (Sitagliptin phosphate) .... One by mouth qd 8)  Acetazolamide 125 Mg Tabs (Acetazolamide) .... One by mouth two times a day  Patient Instructions: 1)  Please schedule a follow-up appointment in 4 months. 2)  BMP prior to visit, ICD-9: 401.9 3)  HbgA1C prior to visit, ICD-9: 250.00 4)  Urine Microalbumin prior to visit, ICD-9: 250.00 5)  Use sugar free citracelle once daily 6)  Decrease your intake of saturated fats (ie red meat, cheese,  ice cream etc)    Current Allergies (reviewed today): No known allergies

## 2010-08-14 NOTE — Letter (Signed)
   [place] [facility] [address] [place], [place]  [address]  [place] Phone: [phone]      Nov 24, 2009   Karin POLLITT [address] [place], [place] [address]  RE:  LAB RESULTS  Dear  Mr. Amir,  The following is an interpretation of your most recent lab tests.  Please take note of any instructions provided or changes to medications that have resulted from your lab work.  ELECTROLYTES:  Good - no changes needed  KIDNEY FUNCTION TESTS:  Good - no changes needed, Stable - no changes needed          Sincerely Yours,    Dr. Thomos Lemons

## 2010-08-16 NOTE — Miscellaneous (Signed)
Summary: lab orders  Clinical Lists Changes  Orders: Added new Test order of T-Basic Metabolic Panel [phone]) - Signed Added new Test order of T- Hemoglobin A1C ([id number]) - Signed

## 2010-08-16 NOTE — Assessment & Plan Note (Signed)
Summary: 3 month fu/dt   Vital Signs:  Patient profile:   77 year old male Height:      69 inches Weight:      196 pounds BMI:     29.05 O2 Sat:      97 % on Room air Temp:     97.8 degrees F oral Pulse rate:   79 / minute Resp:     18 per minute BP sitting:   110 / 80  (left arm) Cuff size:   large  Vitals Entered By: Glendell Docker CMA (July 20, 2010 3:48 PM)  O2 Flow:  Room air CC: 3 Month follow, Type 2 diabetes mellitus follow-up Is Patient Diabetic? Yes Pain Assessment Patient in pain? no      Comments requesting a 90 day supply of omperazole to [facility], no concerns   Primary Care Provider:  DThomos Lemons DO  CC:  3 Month follow and Type 2 diabetes mellitus follow-up.  History of Present Illness:  Type 2 Diabetes Mellitus Follow-Up      This is a 77 year old man who presents for Type 2 diabetes mellitus follow-up.  The patient reports weight gain.  The patient denies the following symptoms: chest pain.  Since the last visit the patient reports poor dietary compliance and not exercising regularly.     Preventive Screening-Counseling & Management  Alcohol-Tobacco     Smoking Status: quit  Allergies: 1)  Byetta 5 Mcg Pen (Exenatide)  Past History:  Past Medical History: Current Problems:  HYPERTENSION (ICD-401.9)  HYPERLIPIDEMIA (ICD-272.4)  CAD    COPD (ICD-496)    RENAL INSUFFICIENCY (ICD-588.9) GERD (ICD-530.81) FATIGUE, CHRONIC (ICD-780.79) B12 DEFICIENCY (ICD-266.2) MEMORY LOSS (ICD-780.93) VERTIGO (ICD-780.4) DIABETES MELLITUS, TYPE II (ICD-250.00) ASTHMA (ICD-493.90)   Positional vertigo    Polyneuropathy - diabetes  Past Surgical History: PTCA/stent             Family History: Family History of CAD Male 1st degree relative <60 Family History Breast cancer 1st degree relative <50 Family History Diabetes 1st degree relative            Social History:  Married  Alcohol use-no     Former Smoker - smoked  approximately 1 pack  per day x 10 to 15 years   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   Lungs:  normal respiratory effort and normal breath sounds.   Heart:  normal rate, regular rhythm, and no gallop.   Extremities:  No lower extremity edema    Impression & Recommendations:  Problem # 1:  DIABETES MELLITUS, TYPE II (ICD-250.00) Assessment Unchanged victoza cost prohibitive wt gain over holidays trial of tradjenta  The following medications were removed from the medication list:    Victoza 18 Mg/46ml Soln (Liraglutide) .Marland Kitchen... 1.2 mg once daily His updated medication list for this problem includes:    Low-dose Aspirin 81 Mg Tabs (Aspirin) ..... One by mouth once daily    Lisinopril 10 Mg Tabs (Lisinopril) ..... One by mouth once daily    Tradjenta 5 Mg Tabs (Linagliptin) ..... One by mouth once daily  Complete Medication List: 1)  Low-dose Aspirin 81 Mg Tabs (Aspirin) .... One by mouth once daily 2)  Amlodipine Besylate 10 Mg Tabs (Amlodipine besylate) .... One by mouth once daily 3)  Crestor 20 Mg Tabs (Rosuvastatin calcium) .... One by mouth once daily 4)  Omeprazole  20 Mg Tbec (Omeprazole) .... Take 1 tablet by mouth once a day 5)  Lisinopril 10 Mg Tabs (Lisinopril) .... One by mouth once daily 6)  Bd Pen Needle Short U/f 31g X 8 Mm Misc (Insulin pen needle) .... Use for insulin injection two times a day 7)  Tradjenta 5 Mg Tabs (Linagliptin) .... One by mouth once daily  Patient Instructions: 1)  Please schedule a follow-up appointment in 4 months. 2)  BMP prior to visit, ICD-9: 250.00 3)  HbgA1C prior to visit, ICD-9: 250.00 4)  Hepatic Panel prior to visit, ICD-9: 272.4 5)  Lipid Panel prior to visit, ICD-9: 272.4 6)  Please return for lab work one (1) week before your next appointment.  Prescriptions: OMEPRAZOLE 20 MG TBEC (OMEPRAZOLE) Take 1 tablet by mouth once a day  #90 x 1   Entered  and Authorized by:   D. Thomos Lemons DO   Signed by:   D. Thomos Lemons DO on 07/20/2010   Method used:   Electronically to        [facility] (retail)       [address]       [place], [place]  [address]       Ph: [phone]       Fax: [phone]   RxID:   [id number] TRADJENTA 5 MG TABS (LINAGLIPTIN) one by mouth once daily  #30 x 5   Entered and Authorized by:   D. Thomos Lemons DO   Signed by:   D. Thomos Lemons DO on 07/20/2010   Method used:   Print then Give to Patient   RxID:   [id number]    Orders Added: 1)  Est. Patient Level III [64403]     Current Allergies (reviewed today): BYETTA 5 MCG PEN (EXENATIDE)

## 2010-08-23 ENCOUNTER — Encounter: Payer: Self-pay | Admitting: Internal Medicine

## 2010-10-26 ENCOUNTER — Ambulatory Visit: Payer: Self-pay | Admitting: Internal Medicine

## 2010-10-29 ENCOUNTER — Other Ambulatory Visit: Payer: Self-pay | Admitting: Internal Medicine

## 2010-10-29 DIAGNOSIS — E785 Hyperlipidemia, unspecified: Secondary | ICD-10-CM

## 2010-10-29 DIAGNOSIS — E119 Type 2 diabetes mellitus without complications: Secondary | ICD-10-CM

## 2010-10-29 LAB — BASIC METABOLIC PANEL
BUN: 25 mg/dL — ABNORMAL HIGH (ref 6–23)
Creat: 1.66 mg/dL — ABNORMAL HIGH (ref 0.40–1.50)
Potassium: 4.7 mEq/L (ref 3.5–5.3)

## 2010-10-29 LAB — HEPATIC FUNCTION PANEL
Albumin: 4.1 g/dL (ref 3.5–5.2)
Total Protein: 6.9 g/dL (ref 6.0–8.3)

## 2010-10-29 LAB — LIPID PANEL
Cholesterol: 212 mg/dL — ABNORMAL HIGH (ref 0–200)
HDL: 43 mg/dL (ref >39)
Triglycerides: 232 mg/dL — ABNORMAL HIGH (ref <150)

## 2010-10-29 LAB — HEMOGLOBIN A1C
Hgb A1c MFr Bld: 8.4 % — ABNORMAL HIGH (ref <5.7)
Mean Plasma Glucose: 194 mg/dL — ABNORMAL HIGH (ref <117)

## 2010-11-04 ENCOUNTER — Other Ambulatory Visit: Payer: Self-pay | Admitting: Internal Medicine

## 2010-11-05 ENCOUNTER — Telehealth: Payer: Self-pay | Admitting: Internal Medicine

## 2010-11-05 NOTE — Telephone Encounter (Signed)
Refill- januvia 50mg  tab [facility]. Take one tablet by mouth one time daily. Qty 90. Last fill 1.26.12

## 2010-11-05 NOTE — Telephone Encounter (Signed)
Rx denial sent to pharmacy. Sent refill request for 4/22. Januvia not on list for of patients current medication

## 2010-11-05 NOTE — Telephone Encounter (Signed)
Medication refill for Januvia denied, not on patients current list of medications as taking

## 2010-11-07 ENCOUNTER — Ambulatory Visit (INDEPENDENT_AMBULATORY_CARE_PROVIDER_SITE_OTHER): Payer: Medicare Other | Admitting: Internal Medicine

## 2010-11-07 ENCOUNTER — Encounter: Payer: Self-pay | Admitting: Internal Medicine

## 2010-11-07 VITALS — BP 118/72 | HR 91 | Temp 97.7°F | Resp 18 | Ht 69.0 in | Wt 200.0 lb

## 2010-11-07 DIAGNOSIS — E119 Type 2 diabetes mellitus without complications: Secondary | ICD-10-CM

## 2010-11-07 DIAGNOSIS — F4321 Adjustment disorder with depressed mood: Secondary | ICD-10-CM

## 2010-11-07 DIAGNOSIS — I10 Essential (primary) hypertension: Secondary | ICD-10-CM

## 2010-11-07 MED ORDER — SITAGLIPTIN PHOSPHATE 50 MG PO TABS: 50.0000 mg | ORAL_TABLET | Freq: Every day | ORAL | Status: DC

## 2010-11-07 MED ORDER — CITALOPRAM HYDROBROMIDE 10 MG PO TABS: 10.0000 mg | ORAL_TABLET | Freq: Every day | ORAL | Status: DC

## 2010-11-08 ENCOUNTER — Telehealth: Payer: Self-pay | Admitting: Internal Medicine

## 2010-11-08 NOTE — Telephone Encounter (Signed)
Gregory Farrell in [employer] called pt he does not want referral

## 2010-11-08 NOTE — Telephone Encounter (Signed)
Noted  

## 2010-11-27 NOTE — Op Note (Signed)
NAME:  Gregory Farrell, Gregory Farrell NO.:  [id number]   MEDICAL RECORD NO.:  [id number]          PATIENT TYPE:  AMB   LOCATION:  DAY                          FACILITY:  [facility]   PHYSICIAN:  Wilmon Arms. Corliss Skains, M.D. DATE OF BIRTH:  1932/12/21   DATE OF PROCEDURE:  05/19/2007  DATE OF DISCHARGE:                               OPERATIVE REPORT   PREOPERATIVE DIAGNOSIS:  Massive right inguinal hernia.   POSTOPERATIVE DIAGNOSIS:  Massive right inguinal hernia.   PROCEDURE PERFORMED:  Right inguinal hernia repair with mesh.   SURGEON:  Wilmon Arms. Tsuei, M.D.   ANESTHESIA:  General endotracheal.   INDICATIONS:  The patient is a 77 year old male who has had a right  inguinal hernia for many years.  He was originally scheduled for surgery  with Dr. Carolynne Edouard in 2005.  For some reason, he decided to cancel this.  Since that time, the hernia has gotten much larger and protrudes all the  way down into his scrotum.  His main complaint is that his testicle is  being pushed farther and father out due to the hernia.  He denies any  obstructive symptoms.  He received cardiac clearance by Dr. Jens Som at  [facility].   DESCRIPTION OF PROCEDURE:  The patient is brought to the operating room  and placed in the supine position on the operating table.  After an  adequate level of general anesthesia was obtained, the patient's right  groin was shaved, prepped with Betadine, and draped in a sterile  fashion.  Prep included his entire penis and scrotum.  The area above  the right inguinal ligament was infiltrated with 0.25% Marcaine.  A time  out was taken to ensure the proper patient and proper procedure.  We  made a long oblique incision above the inguinal ligament.  Dissection  was carried down to the subcutaneous tissues to a very large hernia sac.  We were able to bluntly dissect the hernia sac off of the right  testicle.  The spermatic cord was circumferentially retracted with a  Penrose  drain.  We were able to identify some external oblique fascia.  This was opened along the direction of its fibers down to a very loose  external ring.  Blunt dissection was used to dissect around the hernia  sac and spermatic cord.  The hernia sac was dissected free from the  spermatic cord back to the internal ring.  The internal ring and floor  of the inguinal canal were completely obliterated due to the large size  of the hernia.  We were able to reduce the hernia sac back into the  preperitoneal space.  The internal ring was then tightened with a  running 2-0 Vicryl suture.  The floor of the inguinal canal was  reapproximated with a 0 PDS suture.  A 3 inch by 6 inch Ultrapro mesh  was cut into a keyhole shape and secured to the floor of the inguinal  canal with 2-0 Prolene sutures.  The tails were tucked around the  spermatic cord and sewn together with a 2-0  Prolene.  The mesh was then  tucked underneath the external oblique fascia.  The  external oblique fascia was reapproximated with 2-0 Vicryl.  3-0 Vicryl  was used to close the subcutaneous tissues and 4-0 Monocryl was used to  close the skin.  Steri-Strips and clean dressings were applied.  The  patient was then extubated and brought to recovery in stable condition.  All sponge, instrument, and needle counts were correct.      Wilmon Arms. Tsuei, M.D.  Electronically Signed     MKT/MEDQ  D:  05/19/2007  T:  05/19/2007  Job:  [id number]

## 2010-11-27 NOTE — Assessment & Plan Note (Signed)
[facility]                            CARDIOLOGY OFFICE NOTE   SUHEYB, RAUCCI                          MRN:          [id number]  DATE:04/06/2008                            DOB:          01/21/1933    [name] is a very pleasant 77 year old gentleman who I have followed  in the past for coronary artery disease.  His last catheterization was  performed in January 2004, his ejection fraction was 65%.  There was a  30-50% left main.  The Cypher stent in the LAD was widely patent.  There  was an eccentric area of stenosis of 50-70% in the LAD that was  unchanged from previous.  The distal obtuse marginal at its origin from  a bifurcation with his second obtuse marginal was totally occluded, both  filled via left-to-left collaterals and there was also right-to-left  collaterals.  The previous OM at the site of PCI was total.  The right  coronary artery had a 50% lesion.  His last Myoview was performed on  April 02, 2007.  At that time, his ejection fraction was 65%.  There  was very mild ischemia in the inferior wall and probable soft tissue  attenuation as well.  Also note, we did perform an abdominal ultrasound  on April 02, 2007, that showed no significant aneurysm.  Since I saw  him before he does have dyspnea at the more extreme activities, but now  with routine activities.  This has been a longstanding issue.  There was  no orthopnea, PND, pedal edema, palpitations, presyncope, syncope or  chest pain.   MEDICATIONS:  Include,  1. Lipitor 40 mg p.o. daily.  2. Aspirin 325 mg p.o. daily.  3. Lisinopril 10 mg p.o. daily.  4. Amlodipine 5 mg p.o. daily.  5. Januvia 50 daily.  6. Acetazolamide 125 mg p.o. daily.   PHYSICAL EXAMINATION:  VITAL SIGNS:  Blood pressure of 100/64 and his  pulse of 62.  Weighs 178 pounds.  HEENT:  Normal.  NECK:  Supple and no bruits.  CHEST:  Clear.  CARDIOVASCULAR:  Regular rate and rhythm.  ABDOMEN:  No  tenderness.  EXTREMITIES:  Showed no edema.   Electrocardiogram shows a sinus rhythm at a rate of 62.  There is right  bundle branch block.  A prior lateral infarct is also evident.   DIAGNOSES:  1. Coronary artery disease - the patient has had no chest pain and his      Myoview approximately 1 year ago with low risk.  We will continue      with medical therapy to include his aspirin, statin, ACE inhibitor.      He will also continue with lifestyle modification including diet      and exercise.  He does not smoke.  2. Hypertension - His blood pressure adequately controlled.  3. Hyperlipidemia - He will continue his Lipitor.  We will check      lipids and liver and adjust the goal LDL of less than 70.  4. History of mild renal insufficiency - this has  been followed by Dr.      Radene Knee.  5. Remote history of tobacco abuse.   We will see him back in 1 year.     Madolyn Frieze Jens Som, MD, [facility]  Electronically Signed    BSC/MedQ  DD: 04/06/2008  DT: 04/07/2008  Job #: [id number]

## 2010-11-27 NOTE — Assessment & Plan Note (Signed)
[facility]                            CARDIOLOGY OFFICE NOTE   [name], [name]                          MRN:          [id number]  DATE:03/25/2007                            DOB:          Jan 24, 1933    [name] is an extremely pleasant 77 year old male with past medical  history of coronary disease, diabetes mellitus, hypertension,  hyperlipidemia who presents for establishment.  The patient's cardiac  history dates back to April 2003.  At that time the patient had cardiac  catheterization and revealed an 80-90% proximal LAD involving the first  ostial diagonal branch which was also 80-90%.  There was a 95% second  diagonal.  Distal circumflex had a 70% lesion.  The ejection fraction  was 50-55% with mild anterior hypokinesis.  At that time it was felt  that coronary artery bypass graft was the best long-term option, but  patient instead preferred PCI.  Therefore on November 10, 2001 the patient  underwent a Cipher stent to the proximal LAD and PTCA to the mid  circumflex lesion.  Note there apparently was still moderately residual  mid LAD lesion.  Note the patient's most recent cardiac catheterization  was around July 20, 2002.  At that time he was found to have an  ejection fraction of 65%.  There was a 30-50% left main.  The Cipher  stent in the LAD was wildly patent.  There was an eccentric area of  stenosis of 50-70% in the LAD that was unchanged from previous.  The  distal obtuse marginal at its origin from a bifurcation with the second  obtuse marginal is totally occluded, but filled via left to left and  also right to left collaterals.  Previous OM at the site of PCI was  subtotal.  The right coronary artery had a 50% ostial lesion.  It was  felt that medical therapy was recommended.  The patient recently changed  primary care physicians and wanted his care here at [facility].  Note he  does have dyspnea with more extreme activities but not with  routine  activities.  There is no orthopnea, PND, pedal edema, palpitations,  presyncope, syncope or exertional chest pain.   PRESENT MEDICATIONS:  1. Lisinopril 20 mg p.o. daily.  2. Amlodipine 10 mg p.o. daily.  3. Lipitor 40 mg p.o. daily.  4. Aspirin 325 mg p.o. daily.   ALLERGIES:  He has no known drug allergies.   SOCIAL HISTORY:  He has remote history of tobacco use, but has not  smoked since April 2003.  He does not consume alcohol.   FAMILY HISTORY:  Positive for coronary artery disease in his brother who  had stents placed at age 28.   PAST MEDICAL HISTORY:  Significant for diabetes mellitus.  However, this  has improved with weight loss and is now apparently diet-controlled.  He  also has a history of hypertension and hyperlipidemia.  He has a history  of coronary disease as outlined in the HPI.  He has had no other  surgeries.   REVIEW OF SYSTEMS:  He  denies any headaches, fever or chills.  There is  no productive cough or hemoptysis.  There is no dysphagia, odynophagia,  melena or hematochezia.  There is no dysuria or hematuria.  __________  .  There is no orthopnea, PND or pedal edema.  There is no claudication.  The remaining systems are negative.   PHYSICAL EXAMINATION:  VITAL SIGNS:  Shows blood pressure 124/73, pulse  80, weighs 173 pounds.  GENERAL:  He is well-developed and well-nourished in no acute distress.  SKIN:  Warm and dry.  NEUROLOGICAL:  He is not depressed.  EXTREMITIES:  There is no peripheral clubbing.  BACK:  Normal.  HEENT:  Normal with normal eye lids.  NECK:  Supple with a normal upstroke bilaterally.  There are no bruits  noted.  There is no jugular venous distention, and no thyromegaly is  noted.  CHEST:  Clear to auscultation, __________  expansion.  CARDIOVASCULAR:  Regular rate and rhythm, normal S1, S2.  He had no  murmurs, rubs or gallops noted.  His PMI is nondisplaced.  ABDOMEN:  Nontender, positive bowel sounds, no  hepatosplenomegaly.  There is a question of a pulsatile mass on deep palpation of the mid  abdomen, and there is also a bruit.  He has 2+ normal pulses  bilaterally, no bruits.  EXTREMITIES:  Show no edema that I could palpate, no cords.  He has 1+  dorsalis pedis pulses bilaterally.  NEUROLOGIC:  Grossly intact.   Electrocardiogram shows sinus rhythm at a rate of 72.  There is a right  bundle branch block.  There is a __________  infarct.   DIAGNOSES:  1. Coronary artery disease - the patient does have some dyspnea and is      also apparently scheduled for hernia surgery.  We will plan to      proceed with an adenosine Myoview.  It shows normal perfusion.  We      will not proceed with further cardiac evaluation.  He will continue      on his aspirin, statin, and ACE inhibitor.  2. Hypertension - his blood pressure appears to be adequately      controlled on his present medications.  3. Hyperlipidemia.  The patient did have laboratories drawn recently      on March 12, 2007 that showed an LDL 81.  His liver functions were      normal.  I will not increase his Lipitor as apparently he has had      myalgias on other statins in the past and has minimal myalgias at      present on Lipitor.  Advancing may cause worsening symptoms.  4. History of mild renal insufficiency.  5. Remote tobacco abuse.  6. Question of pulsatile mass and bruit in the mid abdomen on exam -      we will schedule him to have abdominal ultrasound to exclude      aneurysm.   We will see him back in one year.     Madolyn Frieze Jens Som, MD, [facility]  Electronically Signed    BSC/MedQ  DD: 03/25/2007  DT: 03/26/2007  Job #: [id number]

## 2010-11-28 DIAGNOSIS — F4321 Adjustment disorder with depressed mood: Secondary | ICD-10-CM | POA: Insufficient documentation

## 2010-11-28 NOTE — Assessment & Plan Note (Signed)
BP: 118/72 mmHg  Well controlled.  Continue current medication regimen.  Lab Results  Component Value Date   CREATININE 1.66* 10/29/2010

## 2010-11-28 NOTE — Assessment & Plan Note (Signed)
Pt has poor relationship with wife.  I suggest marriage counseling Use SSRI as directed Patient advised to call office if symptoms persist or worsen.

## 2010-11-28 NOTE — Progress Notes (Signed)
Subjective:    Patient ID: Gregory Farrell, male    DOB: 08-15-1932, 77 y.o.   MRN: [id number]  HPI 77 y/o white male with hx of DM II, Htn, and CRI for follow up.   DM II - dietary compliance worse.  Pt attributes to poor relationship with wife.  He feels depressed.  They don't enjoy spending time with each other.  He also has poor self image.  He is very self conscious about his self image.  He feels like he is not attractive due his obesity.  Htn - stable  Review of Systems Negative for chest pain.  Denies suicidal ideation  Past Medical History  Diagnosis Date  . Hypertension   . COPD (chronic obstructive pulmonary disease)   . GERD (gastroesophageal reflux disease)   . Renal insufficiency   . Fatigue   . Asthma   . Hyperlipidemia   . CAD (coronary artery disease)   . B12 deficiency   . Memory loss   . Vertigo   . Diabetes mellitus     type II, poly neuropathy    History   Social History  . Marital Status: Married    Spouse Name: N/A    Number of Children: N/A  . Years of Education: N/A   Occupational History  . Retired    Social History Main Topics  . Smoking status: Former Smoker -- 1.0 packs/day for 10 years  . Smokeless tobacco: Not on file  . Alcohol Use: No  . Drug Use:   . Sexually Active:    Other Topics Concern  . Not on file   Social History Narrative  . No narrative on file    Past Surgical History  Procedure Date  . Coronary angioplasty with stent placement     Family History  Problem Relation Age of Onset  . Coronary artery disease    . Breast cancer    . Diabetes      Allergies  Allergen Reactions  . Exenatide     REACTION: nausea    Current Outpatient Prescriptions on File Prior to Visit  Medication Sig Dispense Refill  . amLODipine (NORVASC) 10 MG tablet Take 10 mg by mouth daily.        Marland Kitchen aspirin 81 MG tablet Take 81 mg by mouth daily.        . Insulin Pen Needle (B-D ULTRAFINE III SHORT PEN) 31G X 8 MM MISC Use for  insulin injection two times a day.       . lisinopril (PRINIVIL,ZESTRIL) 10 MG tablet Take 10 mg by mouth daily.        Marland Kitchen omeprazole (PRILOSEC) 20 MG capsule Take 20 mg by mouth daily.        . rosuvastatin (CRESTOR) 20 MG tablet Take 20 mg by mouth daily. 1/2 tablet by mouth        BP 118/72  Pulse 91  Temp(Src) 97.7 F (36.5 C) (Oral)  Resp 18  Ht 5\' 9"  (1.753 m)  Wt 200 lb (90.719 kg)  BMI 29.53 kg/m2  SpO2 100%       Objective:   Physical Exam  Constitutional: Appears well-developed and well-nourished. No distress.  HENT:  Eyes: Conjunctivae are normal. Pupils are equal, round, and reactive to light.  Neck: Normal range of motion. Neck supple. No thyromegaly present.       No carotid bruit  Cardiovascular: Normal rate, regular rhythm and normal heart sounds.  Exam reveals no gallop and no friction  rub.   No murmur heard. Pulmonary/Chest: Effort normal and breath sounds normal.  No wheezes. No rales.  Abdominal: Soft. Bowel sounds are normal. No mass. There is no tenderness.  Neurological: Alert. No cranial nerve deficit.  Skin: Skin is warm and dry.  Psychiatric: labile affect, normal judgement    Assessment & Plan:

## 2010-11-28 NOTE — Assessment & Plan Note (Signed)
Deteriorated due to poor dietary compliance.  Lab Results  Component Value Date   HGBA1C 8.4* 10/29/2010   Continue Januvia.  I urged daily walking program.

## 2010-11-30 NOTE — Letter (Signed)
March 24, 2006     Richard C. Leeanne Deed, D.P.M.  [address] [place], [place] [address]   RE:  Gregory Farrell, Gregory Farrell  MRN:  [id number]  /  DOB:  1933/03/05   Dear Dr. Leeanne Deed:   This letter is in reference to medical clearance for patient Gregory Farrell's  upcoming toenail removal.  The patient is a 77 year old white male with  history of coronary artery disease, hyperlipidemia, hypertension and chronic  renal insufficiency.  From a cardiac standpoint, the patient had his last  cardiac catheterization in January 2004, which revealed occlusion of the  distal circumflex, which was treated with angioplasty and a Cypher stent at  LAD.  The patient has had follow-up with his cardiologist, Dr. Mayford Knife, in  early 2007.  The patient has no symptoms of angina and has been doing well,  controlling his risk factors, which were dyslipidemia, hypertension and type  2 diabetes.  Review of chart notes that he had a normal Cardiolite in March  2005 with EF of 68%.   In terms of his diabetes, his blood sugars have been very well-controlled  and his last hemoglobin A1c noted in August 2007 was 6.4, and his lipids  were at goal.   He does have a remote history of tobacco abuse and was sent for pulmonary  function tests in March 2006.  The patient had mild air flow obstruction and  some air trapping; however, he does not currently have any issues with  wheezing or active pulmonary issues.   CURRENT MEDICATIONS:  1. Aspirin 325 mg once a day.  2. Lisinopril 20 mg once a day.  3. Flovent 110 mcg inhaled b.i.d.  4. Actos 15 mg once a day.  5. Crestor 10 mg at bedtime.  6. Norvasc 10 mg once a day.   In summary, Gregory Farrell is relatively low-risk from a cardiac standpoint for  his upcoming surgery.  Would not recommend any further cardiovascular  testing.  If need be, his aspirin most likely could be held safely during  his procedure.  If you have any other questions or concerns, please feel  free to contact me  at [phone].    Sincerely,      Barbette Hair. Artist Pais, DO   RDY/MedQ  DD:  03/24/2006  DT:  03/25/2006  Job #:  [id number]

## 2010-11-30 NOTE — Consult Note (Signed)
[place]. [facility]  Patient:    BOWYN, MERCIER Visit Number: [id number] MRN: [id number]          Service Type: CAT Location: 2000 2002 01 Attending Physician:  Lyn Records. Iii Dictated by:   Mikey Bussing, M.D. Proc. Date: 11/09/01 Admit Date:  11/09/2001   CC:         [facility]   Consultation Report  CONSULTANT:  Kerin Perna, M.D.  PHYSICIAN REQUESTING CONSULTATION:  Verdis Prime, M.D.  PRIMARY CARE PHYSICIAN:  Francesca Oman, M.D.  REASON FOR CONSULTATION:  Severe three-vessel coronary artery disease with class IV angina.  CHIEF COMPLAINT:  Chest pain.  HISTORY OF PRESENT ILLNESS:  I was asked to see this 77 year old white male for evaluation of potential coronary artery bypass surgery for treatment of his recently diagnosed severe three-vessel coronary artery disease.  The patient has had no prior cardiac history but had a recent episode of nocturnal chest pain which was substernal squeezing and severe in nature with radiation to the left shoulder and resolving after 15 minutes of rest.  Patient has had subsequent exertional chest pain while doing yard work which has been associated with dyspnea on exertion but no diaphoresis, or sweating, or syncope, or palpitation.  Because of the patients new onset of angina, he was evaluated by Dr. Armanda Magic.  Dr. Malachy Mood exam noted a blood pressure of 170/110 and she placed him on Toprol and aspirin and scheduled a cardiac catheterization.  This was performed today.  This demonstrated a 90% proximal LAD diagonal stenosis, a 40% stenosis of the main circumflex with a 70% stenosis of the distal obtuse marginal, and probable right ostial coronary artery disease.  Left ventricular end diastolic pressure was 22 mmHg and the ejection fraction was 55%.  The patient was evaluated by Dr. Verdis Prime for angioplasty and felt to be a potential angioplasty candidate although Dr. Katrinka Blazing felt  that coronary bypass grafting would be the preferable therapy for his three-vessel disease.  The patient is currently stable on IV heparin following cardiac catheterization.  His left main was without disease.  PAST MEDICAL HISTORY: 1. Hyperlipidemia. 2. Hypertension. 3. No known drug allergies.  CURRENT MEDICATIONS:  None.  PAST SURGICAL HISTORY:  No surgical procedures.  SOCIAL HISTORY:  The patient is semiretired, is a Production designer, theatre/television/film for [employer]. He does not smoke cigarettes currently but smoked one-half pack a day for 10 years.  He is married with one child.  FAMILY HISTORY:  Positive for diabetes, cancer, and coronary artery disease. One brother had bypass surgery in his 48s.  REVIEW OF SYSTEMS:  No constitutional symptoms of weight loss, fever, or night sweats.  No pulmonary symptoms of productive cough, hemoptysis, pneumonia, or thoracic trauma.  Cardiac review negative for orthopnea, PND, or ankle edema, positive for exertional chest pain and one episode of nocturnal angina, negative for prior history of MI.  GI history is negative for blood per rectum, hepatitis, jaundice, or abdominal pain.  GU history is negative for hematuria, kidney stones, or prostatism.  Skin is negative for skin cancer or chronic rash.  Neurologic history is negative for TIA, CVA, seizure, or syncope.  Vascular review is negative for DVT, claudication.  Psychiatric review is negative for depression, insomnia, or reduced appetite.  He is right-hand dominant and denies any hematologic problems of easy bruising or easy bleeding.  PHYSICAL EXAMINATION:  VITAL SIGNS:  Patient is 175 pounds.  Blood pressure is 160/80.  Heart rate is 80 and regular, respirations 18 and unlabored.  Temperature is afebrile.  GENERAL APPEARANCE:  A pleasant, elderly white male in his hospital room following cardiac catheterization in no distress.  He is on IV heparin.  HEENT:  ENT is normocephalic, full EOMs.  Pharynx  clear.  Dentition under adequate repair.  NECK:  Supple without JVD. thyromegaly, mass, or carotid bruit.  LYMPHATICS:  Reveal no palpable adenopathy in the cervical, supraclavicular, or axillary regions.  LUNGS:  Clear to auscultation and there is no thoracic deformity.  HEART:  Exam reveals regular rate and rhythm without S3, gallop, murmur, or rub.  ABDOMEN:  Exam is soft, nontender, without organomegaly, mass, or abdominal bruit.  EXTREMITIES:  Reveal no clubbing, edema, cyanosis.  There is a compression dressing in the groin following cardiac catheterization.  VASCULAR:  Exam reveals 2+ pulses bilaterally in the radial and femoral, 1+ pedal pulses.  No venous insufficiency of the lower extremities.  SKIN:  Warm, clear, and dry without rash or lesion.  RECTAL:  Exam is deferred.  NEUROLOGIC:  He is alert and oriented x 3 with full motor function.  No focal motor deficit.  IMPRESSION:  I reviewed the arteriograms performed at cardiac catheterization today and agree with the interpretation of significant three-vessel disease. Because of ostia right and disease at the left anterior descending diagonal bifurcation, he is a suboptimal candidate for angioplasty; however, the procedure is not precluded by the anatomy and extent of his coronary disease. I discussed the coronary bypass operation in detail with the patient including the expected benefits, the major aspects of the procedure including the incisions, the use of general anesthesia in cardiopulmonary bypass, the choice of a conduit for grafting, and the expected hospital recovery period.  He understands the risks of the surgery is 2% or less and that coronary bypass surgery in the setting of three-vessel disease and the symptoms similar to his is the best long-term therapy.  He, however, has decided to proceed with percutaneous intervention and he understands that he may need surgery at a later date.  All questions  were answered.  Dictated by:   Mikey Bussing, M.D. Attending Physician:  Lyn Records. Iii DD:  11/09/01 TD:  11/09/01 Job: [id number]

## 2010-11-30 NOTE — Discharge Summary (Signed)
[place]. [facility]  Patient:    KELIJAH, TOWRY Visit Number: [id number] MRN: [id number]          Service Type: MED Location: [phone] Attending Physician:  Lyn Records. Iii Dictated by:   Anselm Lis, N.P. Admit Date:  11/09/2001 Discharge Date: 11/11/2001   CC:         Mikey Bussing, M.D.   Discharge Summary  CONSULTING PHYSICIANS:  Darci Needle, M.D. and Kathlee Nations Suann Larry, M.D., CVTS surgeons.  PROCEDURE: 1. November 09, 2001, cardiac catheterization by Armanda Magic, M.D. revealing    80 to 90% proximal LAD involving the first ostial diagonal branch 80 to    90%.  Diagonal 2 with a 95% ostial stenosis.  Distal circumflex 70%.  EF    50 to 55%.  Mild anterior hypokinesis.  Mild mitral regurgitation. 2. November 10, 2001, Darci Needle, M.D., spot/culprit angioplasty and    stent (Cypher) proximal LAD, culprit angioplasty of distal circumflex.  DISCHARGE DIAGNOSES: 1. Two vessel obstructive coronary atherosclerotic heart disease.    a. November 09, 2001, cardiac catheterization revealing proximal 80 to 90%       LAD stenosis involving ostium of diagonal 1.  95% diagonal 2.  70%       distal circumflex.  RCA with luminal irregularities to 30 to 40%.       LV gram revealed mild anterior hypokinesis, mild mitral regurgitation,       EF 50 to 55%.    b. Darci Needle, M.D. was consulted; although he thought PCI was an       option, Dr. Katrinka Blazing believed best longterm option would be CABG with       LIMA to the LAD.  CVTS consult was obtained from Kathlee Nations Trigt III,       M.D. who recommended CABG for best longterm option. The patient,       however, deferred deciding to proceed with percutaneous intervention       even though he understood he may need surgery at a later date.  On November 10, 2001, the patient underwent a spot stent (Cypher) proximal LAD and       PTCA to the mid circumflex bifurcation lesion.  Dr. Katrinka Blazing noted  there       was still a moderately severe residual mid LAD stenosis remaining, but       it was not felt to be critical and certainly was not the cause of the       patients presenting symptoms. The patient will be continued on three       months of Plavix as well as aspirin. Altace was added to regimen as well       as Lopressor b.i.d. 2. Risk factor modification secondary to coronary artery disease.    a. Long history of tobacco use; met with smoking cessation counselor and       appears committed to smoking cessation.    b. Dyslipidemia.  The patient was started on Lipitor prior to admission       by Dr. Dayton Scrape.  Dr. Mayford Knife is considering changing to Zocor 40 mg       per day as she has seen some instances of Lipitor increasing       triglycerides further.  She will recheck fasting statin profile in six       weeks. 3.  Thrombocytopenia with platelets post cardiac procedure as low at 77; 80 at    time of discharge.  His admission platelets were 171.  Thrombocytopenia    thought secondary to heparin.  He will have a follow-up CBC in clinic post    discharge. 4. History of hypertension; good control on current medical regimen.  PLAN:  The patient is discharged home in stable condition.  DISCHARGE MEDICATIONS: 1. (New) Plavix 75 mg one p.o. q.d. for three months take with food. 2. Enteric-coated aspirin 325 mg once daily. 3. (New) Altace 2.5 mg p.o. q.d. 4. (New) Lopressor 50 mg 1/2 tablet p.o. b.i.d. 5. Nitroglycerin tablet 0.4 mg sublingual p.r.n. chest pain, up to three    tablets in 15 minutes. 6. Lipitor 20 mg p.o. q.d. (followed by Dr. Dayton Scrape).  ACTIVITY:  No heavy lifting, pushing, or driving day of discharge or day after discharge, then activity as before.  DIET:  Low fat, low cholesterol, no added salt.  WOUND CARE:  May shower.  DISCHARGE INSTRUCTIONS:  The patient is to call our clinic if he develops large amount of swelling or bruising in groin area.  FOLLOW-UP:   Lab work on Friday, May 2, between 8 a.m. and noon, third floor [address].  Will follow up with Armanda Magic, M.D. on Monday, May 19, at 2:30 p.m.  Statin profile on Monday, May 19.  HISTORY OF PRESENT ILLNESS:  Mr. Mcwhirter is a very pleasant 77 year old with no prior cardiac history except for some diet-controlled hyperlipidemia who developed several episodes of substernal chest discomfort exertion related with associated shortness of breath and radiation to the shoulder.  He was seen in clinic with this presentation, November 05, 2001, and was scheduled for cardiac catheterization as well as initiation of low dose beta blocker. Further details of admission as above.  LABORATORY DATA:  WBC 7.5, hemoglobin 15.9, hematocrit 45.3, platelets 171; as low as 77, 88 at the time of discharge.  Sodium 137, potassium 3.6 and 4.1 at the time of discharge, chloride 105, CO2 28, glucose 118 and 125 at time of discharge, BUN 11, creatinine 1.4 and 13 and 1.6 at time of discharge, calcium 9.2.  Chest x-ray from November 09, 2001, revealed lingular scarring, no evidence of acute disease.  EKG from November 10, 2001, reveals NSR with incomplete right bundle branch block.  ST downsloping with T wave inversion V1 through V4; T wave abnormalities V3. Dictated by:   Anselm Lis, N.P. Attending Physician:  Lyn Records. Iii DD:  12/22/01 TD:  12/24/01 Job: [id number]

## 2010-11-30 NOTE — Discharge Summary (Signed)
NAME:  Gregory Farrell, Gregory Farrell NO.:  [id number]   MEDICAL RECORD NO.:  [id number]          PATIENT TYPE:  INP   LOCATION:  1339                         FACILITY:  [facility]   PHYSICIAN:  Wilmon Arms. Corliss Skains, M.D. DATE OF BIRTH:  02-26-1933   DATE OF ADMISSION:  05/19/2007  DATE OF DISCHARGE:  05/21/2007                               DISCHARGE SUMMARY   ADMISSION DIAGNOSIS:  Massive right inguinal hernia.   DISCHARGE DIAGNOSES:  1. Massive right inguinal hernia.  2. Urinary retention.   OPERATION:  Open right inguinal hernia repair with mesh.   BRIEF HISTORY:  The patient is a 77 year old male who has had a right  inguinal hernia for many years.  He neglected to have this repaired  until it got much larger and protruded all the way down into the  scrotum.  After receiving cardiac clearance he was taken to the  operating room for repair of his massive right inguinal hernia.  Postoperatively the patient did reasonably well.  He had some urinary  retention and required replacement of a Foley catheter.  He was then  started on Flomax.  He is still unable to void so he is being sent home  with a Foley and leg bag.  From a surgical standpoint his wound is doing  fine.  He has a little bit of bruising around the incision.  He has  minimal pain.   DISCHARGE INSTRUCTIONS:  Follow up on Monday for Foley removal in Dr.  Fatima Sanger office and follow up with to 2-3 weeks for wound check.  Refrain  from any heavy lifting.  He was given as needed pain medication.  He  should call the office if there are any problems.      Wilmon Arms. Tsuei, M.D.  Electronically Signed     MKT/MEDQ  D:  06/03/2007  T:  06/04/2007  Job:  [id number]   cc:   Barbette Hair. [place], DO  [address] [place], [place] [address]

## 2010-11-30 NOTE — Cardiovascular Report (Signed)
[place]. [facility]  Patient:    Gregory Farrell, Gregory Farrell Visit Number: [id number] MRN: [id number]          Service Type: CAT Location: 2000 2002 01 Attending Physician:  Lyn Records. Iii Dictated by:   Armanda Magic, M.D. Proc. Date: 11/09/01 Admit Date:  11/09/2001   CC:         Meredith Staggers, M.D.   Cardiac Catheterization  REFERRING PHYSICIAN: Meredith Staggers, M.D.  CHIEF COMPLAINT: Chest pain and shortness of breath.  HISTORY OF PRESENT ILLNESS: This is a 77 year old white male with newly diagnosed hyperlipidemia, otherwise no other medical problems who presents with episodes of exertional chest pain, very concerning for angina. He now presents for cardiac catheterization.  PROCEDURES: Left heart catheterization, coronary angiography, left ventriculography.  OPERATOR: Armanda Magic, M.D.  INDICATIONS: Chest pain.  COMPLICATIONS: None.  INTERVENOUS ACCESS: Via right femoral artery, 6 French sheath.  DESCRIPTION OF PROCEDURE: The patient is brought to the cardiac catheterization laboratory in a fasting, nonsedated state.  Informed consent was obtained. The patient was connected to continuous heart rate and pulse oximetry monitoring, and intermittent blood pressure monitoring.  The right groin was prepped and draped in a sterile fashion. Xylocaine 1% was used for local anesthesia. Using the modified Seldinger technique, a 6 French sheath was placed in the right femoral artery. Under fluoroscopic guidance a 6 [place] JL4 catheter was placed in the left coronary artery. Multiple cine films were taken in a 30-degree RAO, 40-degree LAO views. This catheter was then exchanged out over a guide wire for a 6 [place] JR4 catheter, which was placed under fluoroscopic guidance in the right coronary artery. Multiple cine films were taken in a 30-degree RAO, 40-degree LAO views. This catheter was then exchanged out over a guide wire for a 6 French angled  pigtail catheter which was placed in the left ventricular cavity. Left ventriculography was performed in a 30-degree RAO view using a total of 30 cc of contrast at 13 cc/sec. The catheter was then pulled back across the aortic valve with no significant aortic valve gradient. At the end of the procedure, the catheters and sheaths were removed. Manual compression was performed until adequate hemostasis was obtained. The patient was transferred back to the room in stable condition.  RESULTS: 1. Left main coronary artery is widely patent and bifurcates into a    left anterior descending artery and left circumflex artery. 2. The left anterior descending artery has an 80%, very proximal,    almost into the ostium stenosis involving the first diagonal branch    of 80-90%. Just distal to this narrowing there was a second diagonal which    has a 95% ostial stenosis. There are two further diagonal branches which    are patent. The rest of the LAD is widely patent throughout its course at    the apex. 3. The left circumflex gives rise to one small high obtuse marginal branch.    There are luminal irregularities up to 30-40% in the proximal and    midportion of the left circumflex. There is clearly a 40-50% narrowing    in the mid circumflex before the takeoff of a second obtuse marginal branch    which is widely patent. The distal left circumflex has a 70% narrowing. 4. The right coronary artery is widely patent throughout its course with    luminal irregularities up to 30-40%. It bifurcates distally in the    posterior descending artery, and  posterolateral artery, both of which are    widely patent.  LEFT VENTRICULOGRAM: Left ventriculography performed in a 30-degree RAO view using a total of 30 cc of contrast at 13 cc/sec. showed mild anterior hypokinesis, mild MR. Ejection fraction 50-55%. LV pressure was 172/22 mmHg and aortic pressure was 172/86 mmHg.  ASSESSMENT: 1 Two-vessel obstructive  coronary disease. 2. Low-normal left ventricular function with anterior hypokinesis. 3. Hypertension with elevated blood pressure.  PLAN: Review of films with Dr. Katrinka Blazing. Probable CVTS consult. Start Toprol-XL 25 mg a day for blood pressure control. Continue aspirin 325 mg a day. Zocor 20 mg a day was started for hyperlipidemia. Dictated by:   Armanda Magic, M.D. Attending Physician:  Lyn Records. Iii DD:  11/09/01 TD:  11/09/01 Job: [id number]

## 2010-11-30 NOTE — Cardiovascular Report (Signed)
NAME:  Gregory Farrell, Gregory Farrell NO.:  [id number]   MEDICAL RECORD NO.:  [id number]                   PATIENT TYPE:  OIB   LOCATION:  2899                                 FACILITY:  [facility]   PHYSICIAN:  Lesleigh Noe, M.D.            DATE OF BIRTH:  Mar 03, 1933   DATE OF PROCEDURE:  07/20/2002  DATE OF DISCHARGE:                              CARDIAC CATHETERIZATION   INDICATIONS FOR PROCEDURE:  Recent abnormal stress Cardiolite suggesting  inferior ischemia.  The patient has a history of known coronary artery  disease.  He is status post stent of the proximal LAD and also angioplasty  of the distal circumflex.   PROCEDURE PERFORMED:  1. Left heart catheterization.  2. Selective coronary angiogram.  3. Left ventriculography.   DESCRIPTION OF PROCEDURE:  After informed consent, a 6 French sheath was  placed in the right femoral artery using a modified Seldinger technique. A 6  French A2 multipurpose catheter was used for hemodynamic recordings, left  ventriculography by hand injection, and attempted coronary angiography. We  ultimately used 6 [place] Judkins left and right #4 catheters for left and  right coronary angiography. The patient tolerated the procedure without  complications.   RESULTS:  I:  HEMODYNAMIC DATA:  a.  Aortic pressure 125/74.  b.  Left ventricular pressure 1234/10.   II:  LEFT VENTRICULOGRAPHY:  There is mild mid anterior wall hypokinesis.  EF is 65%.   III:  SELECTIVE CORONARY ANGIOGRAPHY:  Heavy calcification is noted  throughout the left and right coronary proximal segments.  a.  Left main coronary artery:  The left main is diffusely narrowed with  between 30 and 50% narrowing.  There is mild catheter damping with  engagement.  b.  Left anterior descending coronary:  The proximal LAD throughout the  implanted Cypher stent is widely patent. There is an intimal flap in the mid  LAD that was present at the time of the last  coronary angiogram that has not  changed.  This was an eccentric area of stenosis that obstructs the vessel  by 50-70%.  The LAD is otherwise free of any significant obstruction.  c.  Circumflex artery:  The circumflex artery is large.  It arises at a  right angle from the distal left main.  No significant obstruction is noted,  although multiple irregularities and regions of ectasia are noted in the  proximal and mid third.  The distal obtuse marginal at its origin from a  bifurcation with the second obtuse marginal is totally occluded but fills by  left to left and also right to left collaterals.  This obtuse marginal is  the site of previous angioplasty and therefore has subtotal clinical  occlusion.  d.  Right coronary:  The right coronary artery is moderate to large in size.  There is 50% ostial narrowing.  A large PDA and two left  ventricular  branches arise, and as mentioned before, collaterals to the distal  circumflex/obtuse marginal are noted.    CONCLUSIONS:  1. Significant coronary atherosclerotic heart disease with total occlusion     of the distal circumflex (the site of previous angioplasty) with the     distal obtuse marginal collateralized by both left to left and right to     left collaterals.  This accounts for the abnormalities noted on the     Cardiolite.  2. The Cypher stent placed in the proximal left anterior descending is     widely patent without evidence of re-stenosis.  There is moderate disease     in the mid left anterior descending.  3. The left main is diffusely narrowed with up to 50% narrowing but not     significantly changed from the previous study.  4. The right coronary artery contains ostial 50% narrowing and supplies     collaterals to the distal left circumflex.  5. The overall left ventricular function is normal.  There is still mild     anterior wall hypokinesis.   PLAN:  Continue medical therapy.  The patient needs serial nuclear testing   to rule out progression of disease in the mid LAD and progression of disease  in the right coronary.  Certainly, if the patient re-develops angina, given  his left main disease, I would consider coronary artery bypass grafting  rather than continued attempts at percutaneous intervention unless the right  coronary is primarily the only significantly involved vessel in which case  PCI would be possible.                                                  Lesleigh Noe, M.D.    HWS/MEDQ  D:  07/20/2002  T:  07/20/2002  Job:  [id number]   cc:   Armanda Magic, M.D.  [address], [address]  [place], [place] [address]  Fax: [phone]   Meredith Staggers, M.D.  [address]  [place]  [place] [address]  Fax: [phone]

## 2010-11-30 NOTE — Cardiovascular Report (Signed)
[facility]  Patient:    Gregory Farrell, Gregory Farrell Visit Number: [id number] MRN: [id number]          Service Type: CAT Location: [id number] [phone] Attending Physician:  Lyn Records. Iii Dictated by:   Darci Needle, M.D. Proc. Date: 11/10/01 Admit Date:  11/09/2001   CC:         Meredith Staggers, M.D.  Armanda Magic, M.D.  Mikey Bussing, M.D.   Cardiac Catheterization  PROCEDURES PERFORMED: 1. Spot/culprit angioplasty and stent of the left anterior descending. 2. Culprit angioplasty of the distal circumflex.  CARDIOLOGIST:  Darci Needle, M.D.  INDICATIONS FOR PROCEDURE: Severe coronary artery disease with relatively recent onset, rest angina consistent with unstable angina.  The patient has significant multivessel coronary disease but has refused to have coronary artery bypass grafting.  This procedure is being done to provide symptomatic relief from significant anginal symptoms.  DESCRIPTION OF PROCEDURE:  After informed consent, a 7 French sheath was started in the right femoral artery using a modified Seldinger technique.   A 6 French A2 multipurpose catheter was used for hemodynamic recordings.  We used a 7 [place] #4 left Judkins catheter to perform guiding shots of the left coronary.  With engagement of the left main there would be damping of the catheter with drop in blood pressure and chest discomfort.  We then switched to a side hole catheter and the intervention was started after he received 4000 units of IV heparin and double bolus followed by an infusion of Integrelin.  We used the BMW wire to enter the LAD.  We performed angioplasty using a 12 mm long Quantum 3.0 mm diameter balloon.  We predilated and then we deployed a 13 mm long 3.0 mm diameter Cypher stent to 17 atmospheres which was in effect a diameter of 3.25 mm.  We did not perform intervention on the mid LAD in the region where multiple large diagonal branches  arise because of stenosis in this region appeared to be moderately severe at most and probably not the cause of the patients presenting symptoms.  We then turned out attention to the circumflex.  This vessel has right angle origin from the left main.  The lesion is a bifurcation lesion involving a large second and third obtuse marginal.  We used two wires, one to protect the more proximal obtuse marginal branch and we performed angioplasty using a 3.0 mm diameter Quantum 20 mm long balloon.  This angioplasty was successful with brisk antegrade flow noted.  There was significant recoil and the final angiographic result though acceptable left the patient will still residual stenosis of up to 50%.  I did not stent this region because of the anticipated risks of occlusion or compromise of the previously uncompromised larger second large obtuse marginal branch.  The patient tolerated this interventional procedure without problems.  The ACT post procedure was 270 seconds.  CONCLUSIONS: 1. Successful culprit/spot angioplasty in the proximal left anterior    descending with reduction in stenosis from 95% to 0%.  There is still    moderate to moderately severe residual mid left anterior descending    disease that remains but it is not felt to be critical and certainly    was not the cause for the patients presenting symptoms. 2. Successful angioplasty of the mid/distal circumflex with reduction in    stenosis from 90% to less than 40% with brisk antegrade flow.  PLAN: 1. Aspirin.  2. Plavix x 3 months. 3. Integrelin x 18 hours. Dictated by:   Darci Needle, M.D. Attending Physician:  Lyn Records. Iii DD:  11/10/01 TD:  11/11/01 Job: [id number]

## 2010-12-06 ENCOUNTER — Ambulatory Visit: Payer: Medicare Other | Admitting: Internal Medicine

## 2010-12-21 ENCOUNTER — Ambulatory Visit: Payer: Medicare Other | Admitting: Internal Medicine

## 2011-01-07 ENCOUNTER — Ambulatory Visit: Payer: Medicare Other | Admitting: Internal Medicine

## 2011-02-04 ENCOUNTER — Telehealth: Payer: Self-pay | Admitting: Internal Medicine

## 2011-02-04 MED ORDER — ROSUVASTATIN CALCIUM 20 MG PO TABS: 20.0000 mg | ORAL_TABLET | Freq: Every day | ORAL | Status: DC

## 2011-02-04 NOTE — Telephone Encounter (Signed)
Rx refill sent to pharmacy. 

## 2011-02-04 NOTE — Telephone Encounter (Signed)
Refill- simvastatin 40mg  tab. Take one tablet by mouth one time daily in the pm. Qty 90. Last fill 4.22.12

## 2011-02-18 ENCOUNTER — Telehealth: Payer: Self-pay | Admitting: Internal Medicine

## 2011-02-18 NOTE — Telephone Encounter (Signed)
Refill- omeprazole 20mg  cap apot. Take one capsule by mouth one time daily. Qty 90. Last fill 5.3.12

## 2011-02-19 ENCOUNTER — Telehealth: Payer: Self-pay | Admitting: Internal Medicine

## 2011-02-19 DIAGNOSIS — E785 Hyperlipidemia, unspecified: Secondary | ICD-10-CM

## 2011-02-19 DIAGNOSIS — I10 Essential (primary) hypertension: Secondary | ICD-10-CM

## 2011-02-19 DIAGNOSIS — E119 Type 2 diabetes mellitus without complications: Secondary | ICD-10-CM

## 2011-02-19 MED ORDER — OMEPRAZOLE 20 MG PO CPDR: 20.0000 mg | DELAYED_RELEASE_CAPSULE | Freq: Every day | ORAL | Status: DC

## 2011-02-19 NOTE — Telephone Encounter (Signed)
Patient has appt on 02-25-11 and wants to know if he needs to come in early for labs?

## 2011-02-19 NOTE — Telephone Encounter (Signed)
Rx refill sent to pharmacy. 

## 2011-02-20 NOTE — Telephone Encounter (Signed)
Call placed to patient at [phone], he was informed of fasting blood work and stated that he will come on Thursday to have blood work drawn

## 2011-02-20 NOTE — Telephone Encounter (Signed)
Cbc, chem7, a1c, urine microalbumin 250.0 lipid/lft 272.4

## 2011-02-21 LAB — BASIC METABOLIC PANEL
CO2: 27 mEq/L (ref 19–32)
Chloride: 105 mEq/L (ref 96–112)
Sodium: 140 mEq/L (ref 135–145)

## 2011-02-21 LAB — LIPID PANEL
Cholesterol: 144 mg/dL (ref 0–200)
LDL Cholesterol: 73 mg/dL (ref 0–99)
Triglycerides: 127 mg/dL (ref <150)

## 2011-02-21 LAB — CBC
Platelets: 170 10*3/uL (ref 150–400)
RBC: 4.75 MIL/uL (ref 4.22–5.81)
RDW: 13.7 % (ref 11.5–15.5)
WBC: 6 10*3/uL (ref 4.0–10.5)

## 2011-02-21 LAB — HEPATIC FUNCTION PANEL
AST: 20 U/L (ref 0–37)
Albumin: 4.2 g/dL (ref 3.5–5.2)
Bilirubin, Direct: 0.1 mg/dL (ref 0.0–0.3)
Indirect Bilirubin: 0.5 mg/dL (ref 0.0–0.9)

## 2011-02-21 LAB — HEMOGLOBIN A1C: Mean Plasma Glucose: 151 mg/dL — ABNORMAL HIGH (ref <117)

## 2011-02-22 LAB — MICROALBUMIN / CREATININE URINE RATIO
Creatinine, Urine: 132.5 mg/dL
Microalb, Ur: 0.81 mg/dL (ref 0.00–1.89)

## 2011-02-25 ENCOUNTER — Ambulatory Visit (INDEPENDENT_AMBULATORY_CARE_PROVIDER_SITE_OTHER): Payer: Medicare Other | Admitting: Internal Medicine

## 2011-02-25 ENCOUNTER — Encounter: Payer: Self-pay | Admitting: Internal Medicine

## 2011-02-25 ENCOUNTER — Ambulatory Visit: Payer: Medicare Other | Admitting: Internal Medicine

## 2011-02-25 DIAGNOSIS — R5383 Other fatigue: Secondary | ICD-10-CM

## 2011-02-25 DIAGNOSIS — I2510 Atherosclerotic heart disease of native coronary artery without angina pectoris: Secondary | ICD-10-CM

## 2011-02-25 DIAGNOSIS — R5381 Other malaise: Secondary | ICD-10-CM

## 2011-02-25 DIAGNOSIS — E119 Type 2 diabetes mellitus without complications: Secondary | ICD-10-CM

## 2011-02-25 NOTE — Patient Instructions (Signed)
Please schedule b12 (fatigue), chem7, a1c (250.0) prior to next visit

## 2011-02-27 ENCOUNTER — Encounter: Payer: Self-pay | Admitting: Internal Medicine

## 2011-02-27 DIAGNOSIS — R5383 Other fatigue: Secondary | ICD-10-CM | POA: Insufficient documentation

## 2011-02-27 NOTE — Assessment & Plan Note (Signed)
Stable. Continue risk factor modification. Follow up with cardiology as well

## 2011-02-27 NOTE — Assessment & Plan Note (Signed)
Improved control. Continue current regimen. Obtain Chem-7 and A1c prior to next visit

## 2011-02-27 NOTE — Assessment & Plan Note (Signed)
Recommend out patient blood pressure log because of low-normal blood pressure. It remains persistently low normal will discuss decreasing Norvasc. Obtain B12 level prior to next visit.

## 2011-02-27 NOTE — Progress Notes (Signed)
  Subjective:    Patient ID: MONTREAL STEIDLE, male    DOB: 1932/12/26, 77 y.o.   MRN: [id number]  HPI patient presents to clinic for followup of multiple medical problems. Complains of generalized fatigue with climbing stairs since his heart attack. Denies chest pain or shortness of breath. History of BPV status post vestibular rehabilitation and sometimes notices dizziness with standing up. No syncope. Has improved his diabetic control with an A1c of 6.9 down from 8.4. States he feels improved since Celexa and this is allowed him to reduce his weight as well. History chronic renal insufficiency with stable creatinine. No clinical evidence of volume overload. No other complaints.  Reviewed past medical history, medications and allergies    Review of Systems see history of present illness     Objective:   Physical Exam Physical Exam  Vitals reviewed. Constitutional:  appears well-developed and well-nourished. No distress.  HENT:  Head: Normocephalic and atraumatic.  Right Ear: Tympanic membrane, external ear and ear canal normal.  Left Ear: Tympanic membrane, external ear and ear canal normal.  Nose: Nose normal.  Mouth/Throat: Oropharynx is clear and moist. No oropharyngeal exudate.  Eyes: Conjunctivae and EOM are normal. Pupils are equal, round, and reactive to light. Right eye exhibits no discharge. Left eye exhibits no discharge. No scleral icterus.  Neck: Neck supple. No thyromegaly present.  Cardiovascular: Normal rate, regular rhythm and normal heart sounds.  Exam reveals no gallop and no friction rub.   No murmur heard. Pulmonary/Chest: Effort normal and breath sounds normal. No respiratory distress.  has no wheezes.  has no rales.  Lymphadenopathy:   no cervical adenopathy.  Neurological:  is alert.  Skin: Skin is warm and dry.  not diaphoretic.  Psychiatric: normal mood and affect.        Assessment & Plan:

## 2011-03-07 ENCOUNTER — Other Ambulatory Visit: Payer: Self-pay | Admitting: Internal Medicine

## 2011-03-07 ENCOUNTER — Telehealth: Payer: Self-pay | Admitting: Internal Medicine

## 2011-03-07 DIAGNOSIS — R5383 Other fatigue: Secondary | ICD-10-CM

## 2011-03-07 DIAGNOSIS — E119 Type 2 diabetes mellitus without complications: Secondary | ICD-10-CM

## 2011-03-07 MED ORDER — CITALOPRAM HYDROBROMIDE 10 MG PO TABS: 10.0000 mg | ORAL_TABLET | Freq: Every day | ORAL | Status: DC

## 2011-03-07 NOTE — Telephone Encounter (Signed)
90 DAY REFILL ON CELEXA 10MG   TAKE 1 TABLET BY MOUTH DAILY

## 2011-03-07 NOTE — Telephone Encounter (Signed)
Refill for 90-day supply sent to pharmacy.

## 2011-03-19 ENCOUNTER — Telehealth: Payer: Self-pay | Admitting: Internal Medicine

## 2011-03-19 MED ORDER — OMEPRAZOLE 20 MG PO CPDR: 20.0000 mg | DELAYED_RELEASE_CAPSULE | Freq: Every day | ORAL | Status: DC

## 2011-03-19 NOTE — Telephone Encounter (Signed)
Rx refill qty changed to 90 and resubmitted to pharmacy.

## 2011-03-19 NOTE — Telephone Encounter (Signed)
Refill- prilosec 20mg  cap astr. Take one capsule by mouth one time daily. Qty 30. Last fill 9.4.12  Pharmacy comments: can patient get a 90 day supply?

## 2011-04-02 ENCOUNTER — Telehealth: Payer: Self-pay | Admitting: Internal Medicine

## 2011-04-02 NOTE — Telephone Encounter (Signed)
Refill- simvastatin 40mg  tab inte. Take one tablet by mouth one time daily in the pm. Qty 90 last fill 4.22.12

## 2011-04-02 NOTE — Telephone Encounter (Signed)
Call placed to patient at [phone], no answer. A detailed voice message was left for patient to return call with name of the cholesterol medication he is currently taking.

## 2011-04-02 NOTE — Telephone Encounter (Signed)
Ok 90 and 1 rf 

## 2011-04-04 NOTE — Telephone Encounter (Signed)
No return cal from patient regarding status of medication., No refills sent to pharmacy-clarification needed from patient on what he is currently taking.

## 2011-04-08 ENCOUNTER — Telehealth: Payer: Self-pay | Admitting: *Deleted

## 2011-04-08 NOTE — Telephone Encounter (Signed)
zocor interacts with norvasc. Highest recommended dose of zocor is 20mg  when using norvasc as well. Was changed from zocor 40mg  between April 2011 and 01/2010. Likely due to interaction potential (increases chances of side effects). crestor 20mg  much stronger than zocor 20 so don't recommend the change. lipitor would be the next closest stronger generic. Could try lipitor 40mg  qd (if hasn't had trouble with previously) with lipid/lft 6-8 wks after change 272.4

## 2011-04-08 NOTE — Telephone Encounter (Signed)
Received call from pt stating he used to take Simvastatin 40mg  daily in the past. Does not remember why he was changed to Crestor but would like to go back on Simvastatin because it was cheaper.  Pt requests rx be sent to [facility] for 90 day supply if ok to change.  Please advise.

## 2011-04-09 NOTE — Telephone Encounter (Signed)
Left message on machine to return my call. 

## 2011-04-09 NOTE — Telephone Encounter (Signed)
Patient returned phone call. Best # [phone]

## 2011-04-09 NOTE — Telephone Encounter (Signed)
Notified pt and he states he will continue with Crestor. Pt still has 2 refills left on current rx and he will contact pharmacy for refill.

## 2011-04-19 ENCOUNTER — Telehealth: Payer: Self-pay | Admitting: Internal Medicine

## 2011-04-19 MED ORDER — AMLODIPINE BESYLATE 10 MG PO TABS: 10.0000 mg | ORAL_TABLET | Freq: Every day | ORAL | Status: DC

## 2011-04-19 MED ORDER — LISINOPRIL 10 MG PO TABS: 10.0000 mg | ORAL_TABLET | Freq: Every day | ORAL | Status: DC

## 2011-04-19 NOTE — Telephone Encounter (Signed)
Refill- lisinopril 10mg  tab blup. Take one tablet by mouth one time daily. Qty 90. Last fill 4.22.12  Refill- amlodipine 10mg  tab asce. Take one tablet by mouth one time daily. Qty 90. Last fill 4.22.12

## 2011-04-19 NOTE — Telephone Encounter (Signed)
Rx refill sent to pharmacy. 

## 2011-04-24 LAB — COMPREHENSIVE METABOLIC PANEL
ALT: 25
AST: 22
Albumin: 3.8
Alkaline Phosphatase: 104
BUN: 23
Chloride: 104
GFR calc Af Amer: 53 — ABNORMAL LOW
Potassium: 5.1
Sodium: 140
Total Bilirubin: 1

## 2011-04-24 LAB — URINALYSIS, ROUTINE W REFLEX MICROSCOPIC
Glucose, UA: NEGATIVE
Hgb urine dipstick: NEGATIVE
Ketones, ur: NEGATIVE
Protein, ur: NEGATIVE

## 2011-04-24 LAB — DIFFERENTIAL
Basophils Absolute: 0
Basophils Relative: 1
Eosinophils Absolute: 0.4
Eosinophils Relative: 6 — ABNORMAL HIGH
Monocytes Absolute: 0.6

## 2011-04-24 LAB — CBC
HCT: 44.3
Platelets: 202
WBC: 6.3

## 2011-05-06 ENCOUNTER — Encounter: Payer: Self-pay | Admitting: Family

## 2011-05-06 ENCOUNTER — Ambulatory Visit (INDEPENDENT_AMBULATORY_CARE_PROVIDER_SITE_OTHER): Payer: Medicare Other | Admitting: Family

## 2011-05-06 VITALS — BP 100/60 | HR 72 | Temp 97.7°F | Resp 16 | Wt 169.1 lb

## 2011-05-06 DIAGNOSIS — H6090 Unspecified otitis externa, unspecified ear: Secondary | ICD-10-CM

## 2011-05-06 DIAGNOSIS — H60399 Other infective otitis externa, unspecified ear: Secondary | ICD-10-CM

## 2011-05-06 MED ORDER — CIPROFLOXACIN-HYDROCORTISONE 0.2-1 % OT SUSP: 3.0000 [drp] | Freq: Two times a day (BID) | OTIC | Status: AC

## 2011-05-06 NOTE — Progress Notes (Signed)
  Subjective:    Patient ID: Gregory Farrell, male    DOB: Mar 28, 1933, 77 y.o.   MRN: [id number]  HPI  Pt is a 77 yr old male who presents today for evaluation of his ears.  He note severe itching bilateral ears.  Not new.  Denies drainage. Was told by the audiologist to have his ears evaluated for ?infection, ? Fungus.  He was told that he has a light to moderate hearing problem- wears hearing aids.      Review of Systems See HPI  Past Medical History  Diagnosis Date  . Hypertension   . COPD (chronic obstructive pulmonary disease)   . GERD (gastroesophageal reflux disease)   . Renal insufficiency   . Fatigue   . Asthma   . Hyperlipidemia   . CAD (coronary artery disease)   . B12 deficiency   . Memory loss   . Vertigo   . Diabetes mellitus     type II, poly neuropathy    History   Social History  . Marital Status: Married    Spouse Name: N/A    Number of Children: N/A  . Years of Education: N/A   Occupational History  . Retired    Social History Main Topics  . Smoking status: Former Smoker -- 1.0 packs/day for 10 years  . Smokeless tobacco: Not on file  . Alcohol Use: No  . Drug Use:   . Sexually Active:    Other Topics Concern  . Not on file   Social History Narrative  . No narrative on file    Past Surgical History  Procedure Date  . Coronary angioplasty with stent placement     Family History  Problem Relation Age of Onset  . Coronary artery disease    . Breast cancer    . Diabetes      Allergies  Allergen Reactions  . Exenatide     REACTION: nausea    Current Outpatient Prescriptions on File Prior to Visit  Medication Sig Dispense Refill  . aspirin 81 MG tablet Take 81 mg by mouth daily.        . citalopram (CELEXA) 10 MG tablet Take 1 tablet (10 mg total) by mouth daily.  90 tablet  1  . omeprazole (PRILOSEC) 20 MG capsule Take 1 capsule (20 mg total) by mouth daily.  90 capsule  1  . rosuvastatin (CRESTOR) 20 MG tablet Take 1 tablet (20 mg  total) by mouth daily. 1/2 tablet by mouth  30 tablet  2  . sitaGLIPtan (JANUVIA) 50 MG tablet Take 1 tablet (50 mg total) by mouth daily.  30 tablet  5    BP 100/60  Pulse 72  Temp(Src) 97.7 F (36.5 C) (Oral)  Resp 16  Wt 169 lb 1.9 oz (76.712 kg)       Objective:   Physical Exam  Gen: pleasant elderly white male, NAD ENT: Bilateral ears- had cerumen which was removed by irrigation. Revealed normal TM's bilaterally without erythema.  Canals with scant amt of adherent white coating.   Psych A and O x 3, calm and pleasant.      Assessment & Plan:

## 2011-05-06 NOTE — Patient Instructions (Signed)
Use drops for the next 7 days.  Call if your symptoms worsen or if no improvement in 1 week.

## 2011-05-06 NOTE — Assessment & Plan Note (Signed)
Will treat with cipro HC otic.  Pt to contact us if symptoms worsen or if they do not improve.  Pt verbalizes understanding.

## 2011-05-07 ENCOUNTER — Telehealth: Payer: Self-pay | Admitting: Internal Medicine

## 2011-05-07 MED ORDER — SITAGLIPTIN PHOSPHATE 50 MG PO TABS: 50.0000 mg | ORAL_TABLET | Freq: Every day | ORAL | Status: DC

## 2011-05-07 NOTE — Telephone Encounter (Signed)
Rx refill sent to pharmacy. 

## 2011-05-07 NOTE — Telephone Encounter (Signed)
Refill-junuvia 50mg  tab merc. Take one tablet by mouth one time daily. Qty 30. Last fill 9.23.12

## 2011-05-15 ENCOUNTER — Other Ambulatory Visit: Payer: Self-pay | Admitting: *Deleted

## 2011-05-15 DIAGNOSIS — E119 Type 2 diabetes mellitus without complications: Secondary | ICD-10-CM

## 2011-05-15 DIAGNOSIS — R5383 Other fatigue: Secondary | ICD-10-CM

## 2011-05-15 LAB — BASIC METABOLIC PANEL
BUN: 24 mg/dL — ABNORMAL HIGH (ref 6–23)
CO2: 26 mEq/L (ref 19–32)
Chloride: 104 mEq/L (ref 96–112)
Creat: 1.79 mg/dL — ABNORMAL HIGH (ref 0.50–1.35)
Potassium: 4.8 mEq/L (ref 3.5–5.3)

## 2011-05-16 LAB — VITAMIN B12: Vitamin B-12: 287 pg/mL (ref 211–911)

## 2011-05-22 ENCOUNTER — Encounter: Payer: Self-pay | Admitting: Internal Medicine

## 2011-05-22 ENCOUNTER — Ambulatory Visit (INDEPENDENT_AMBULATORY_CARE_PROVIDER_SITE_OTHER): Payer: Medicare Other | Admitting: Internal Medicine

## 2011-05-22 ENCOUNTER — Telehealth: Payer: Self-pay | Admitting: Internal Medicine

## 2011-05-22 VITALS — BP 100/72 | HR 85 | Temp 97.7°F | Resp 18 | Wt 171.0 lb

## 2011-05-22 DIAGNOSIS — E119 Type 2 diabetes mellitus without complications: Secondary | ICD-10-CM

## 2011-05-22 DIAGNOSIS — E785 Hyperlipidemia, unspecified: Secondary | ICD-10-CM

## 2011-05-22 DIAGNOSIS — E538 Deficiency of other specified B group vitamins: Secondary | ICD-10-CM

## 2011-05-22 DIAGNOSIS — F4321 Adjustment disorder with depressed mood: Secondary | ICD-10-CM

## 2011-05-22 DIAGNOSIS — Z23 Encounter for immunization: Secondary | ICD-10-CM

## 2011-05-22 DIAGNOSIS — F329 Major depressive disorder, single episode, unspecified: Secondary | ICD-10-CM

## 2011-05-22 MED ORDER — CITALOPRAM HYDROBROMIDE 20 MG PO TABS: 20.0000 mg | ORAL_TABLET | Freq: Every day | ORAL | Status: DC

## 2011-05-22 NOTE — Progress Notes (Signed)
  Subjective:    Patient ID: Gregory Farrell, male    DOB: 1932-08-16, 77 y.o.   MRN: [id number]  HPI Pt presents to clinic for followup of multiple medical problems. Fatigue sx's improved. Reviewed low nl b12 level. Diabetic control improving with a1c 6.5. Depressive sx's improving with celexa. Tolerating statin tx without myalgias or abn lft's. No other complaints.  Past Medical History  Diagnosis Date  . Hypertension   . COPD (chronic obstructive pulmonary disease)   . GERD (gastroesophageal reflux disease)   . Renal insufficiency   . Fatigue   . Asthma   . Hyperlipidemia   . CAD (coronary artery disease)   . B12 deficiency   . Memory loss   . Vertigo   . Diabetes mellitus     type II, poly neuropathy   Past Surgical History  Procedure Date  . Coronary angioplasty with stent placement     reports that he has quit smoking. He does not have any smokeless tobacco history on file. He reports that he does not drink alcohol. His drug history not on file. family history includes Breast cancer in an unspecified family member; Coronary artery disease in an unspecified family member; and Diabetes in an unspecified family member. Allergies  Allergen Reactions  . Exenatide     REACTION: nausea     Review of Systems see hpi     Objective:   Physical Exam  Physical Exam  Nursing note and vitals reviewed. Constitutional: Appears well-developed and well-nourished. No distress.  HENT:  Head: Normocephalic and atraumatic.  Right Ear: External ear normal.  Left Ear: External ear normal.  Eyes: Conjunctivae are normal. No scleral icterus.  Neck: Neck supple. Carotid bruit is not present.  Cardiovascular: Normal rate, regular rhythm and normal heart sounds.  Exam reveals no gallop and no friction rub.   No murmur heard. Pulmonary/Chest: Effort normal and breath sounds normal. No respiratory distress. He has no wheezes. no rales.  Lymphadenopathy:    He has no cervical adenopathy.    Neurological:Alert.  Skin: Skin is warm and dry. Not diaphoretic.  Psychiatric: Has a normal mood and affect.        Assessment & Plan:

## 2011-05-22 NOTE — Patient Instructions (Signed)
Please take over the counter vitamin b12 one a day Please schedule cbc, chem7, a1c 250.0 and lipid/lft 272.4 prior to next visit

## 2011-05-22 NOTE — Telephone Encounter (Signed)
Lab order entered for January 2013.

## 2011-05-26 NOTE — Assessment & Plan Note (Signed)
Good control. Continue current regimen. 

## 2011-05-26 NOTE — Assessment & Plan Note (Signed)
Begin b12 po qd

## 2011-05-26 NOTE — Assessment & Plan Note (Signed)
Increase celexa 20mg  po qd.

## 2011-06-07 ENCOUNTER — Other Ambulatory Visit: Payer: Self-pay | Admitting: Internal Medicine

## 2011-06-07 NOTE — Telephone Encounter (Signed)
Rx refill sent to pharmacy. 

## 2011-08-20 ENCOUNTER — Ambulatory Visit: Payer: Medicare Other | Admitting: Internal Medicine

## 2011-08-21 LAB — CBC
HCT: 43.2 % (ref 39.0–52.0)
Hemoglobin: 13.9 g/dL (ref 13.0–17.0)
MCV: 98 fL (ref 78.0–100.0)
RDW: 12.9 % (ref 11.5–15.5)
WBC: 7.8 10*3/uL (ref 4.0–10.5)

## 2011-08-21 LAB — BASIC METABOLIC PANEL
BUN: 25 mg/dL — ABNORMAL HIGH (ref 6–23)
Chloride: 106 mEq/L (ref 96–112)
Creat: 1.86 mg/dL — ABNORMAL HIGH (ref 0.50–1.35)
Glucose, Bld: 113 mg/dL — ABNORMAL HIGH (ref 70–99)

## 2011-08-21 NOTE — Telephone Encounter (Signed)
Addended by: Mervin Kung A on: 08/21/2011 09:51 AM   Modules accepted: Orders

## 2011-08-22 LAB — HEPATIC FUNCTION PANEL
AST: 18 U/L (ref 0–37)
Albumin: 3.9 g/dL (ref 3.5–5.2)
Bilirubin, Direct: 0.1 mg/dL (ref 0.0–0.3)
Total Bilirubin: 0.4 mg/dL (ref 0.3–1.2)

## 2011-08-22 LAB — LIPID PANEL
HDL: 38 mg/dL — ABNORMAL LOW (ref >39)
LDL Cholesterol: 80 mg/dL (ref 0–99)
Total CHOL/HDL Ratio: 3.9 Ratio
VLDL: 30 mg/dL (ref 0–40)

## 2011-08-22 LAB — HEMOGLOBIN A1C: Mean Plasma Glucose: 154 mg/dL — ABNORMAL HIGH (ref <117)

## 2011-08-27 ENCOUNTER — Ambulatory Visit: Payer: Medicare Other | Admitting: Internal Medicine

## 2011-08-29 ENCOUNTER — Encounter: Payer: Self-pay | Admitting: Internal Medicine

## 2011-08-29 ENCOUNTER — Ambulatory Visit (INDEPENDENT_AMBULATORY_CARE_PROVIDER_SITE_OTHER): Payer: Medicare Other | Admitting: Internal Medicine

## 2011-08-29 VITALS — BP 100/70 | HR 91 | Temp 97.9°F | Resp 16 | Ht 69.0 in | Wt 183.0 lb

## 2011-08-29 DIAGNOSIS — E119 Type 2 diabetes mellitus without complications: Secondary | ICD-10-CM

## 2011-08-29 DIAGNOSIS — H9190 Unspecified hearing loss, unspecified ear: Secondary | ICD-10-CM

## 2011-08-29 DIAGNOSIS — E785 Hyperlipidemia, unspecified: Secondary | ICD-10-CM

## 2011-08-29 NOTE — Patient Instructions (Signed)
Please avoid high potassium foods such as bananas and tomatoes and return to lab in approximately one week for a potassium recheck.(potassium level-hyperkalemia) Schedule  labs prior to next visit: chem7, a1c 250.0

## 2011-09-05 ENCOUNTER — Other Ambulatory Visit: Payer: Self-pay | Admitting: Internal Medicine

## 2011-09-05 DIAGNOSIS — E875 Hyperkalemia: Secondary | ICD-10-CM

## 2011-09-05 DIAGNOSIS — E119 Type 2 diabetes mellitus without complications: Secondary | ICD-10-CM

## 2011-09-05 LAB — POTASSIUM: Potassium: 5.3 mEq/L (ref 3.5–5.3)

## 2011-09-08 DIAGNOSIS — H9190 Unspecified hearing loss, unspecified ear: Secondary | ICD-10-CM | POA: Insufficient documentation

## 2011-09-08 NOTE — Assessment & Plan Note (Signed)
Audiology referral.

## 2011-09-08 NOTE — Assessment & Plan Note (Signed)
At goal. Continue statin tx. 

## 2011-09-08 NOTE — Assessment & Plan Note (Signed)
Follow diabetic diet.

## 2011-09-08 NOTE — Progress Notes (Signed)
  Subjective:    Patient ID: Gregory Farrell, male    DOB: 11-Oct-1932, 78 y.o.   MRN: [id number]  HPI Pt presents to clinic for followup of multiple medical problems. Notes chronic decline in hearing without injury, dizziness, vertigo or tinnitus. a1c increased since last visit and admits to dietary indiscretion. Diabetic eye exam utd. No other complaints.   Past Medical History  Diagnosis Date  . Hypertension   . COPD (chronic obstructive pulmonary disease)   . GERD (gastroesophageal reflux disease)   . Renal insufficiency   . Fatigue   . Asthma   . Hyperlipidemia   . CAD (coronary artery disease)   . B12 deficiency   . Memory loss   . Vertigo   . Diabetes mellitus     type II, poly neuropathy   Past Surgical History  Procedure Date  . Coronary angioplasty with stent placement     reports that he has quit smoking. He has never used smokeless tobacco. He reports that he does not drink alcohol. His drug history not on file. family history includes Breast cancer in an unspecified family member; Coronary artery disease in an unspecified family member; and Diabetes in an unspecified family member. Allergies  Allergen Reactions  . Exenatide     REACTION: nausea     Review of Systems see hpi     Objective:   Physical Exam  Physical Exam  Nursing note and vitals reviewed. Constitutional: Appears well-developed and well-nourished. No distress.  HENT:  Head: Normocephalic and atraumatic.  Right Ear: External ear normal.  Left Ear: External ear normal.  Eyes: Conjunctivae are normal. No scleral icterus.  Neck: Neck supple. Carotid bruit is not present.  Cardiovascular: Normal rate, regular rhythm and normal heart sounds.  Exam reveals no gallop and no friction rub.   No murmur heard. Pulmonary/Chest: Effort normal and breath sounds normal. No respiratory distress. He has no wheezes. no rales.  Lymphadenopathy:    He has no cervical adenopathy.  Neurological:Alert.  Skin: Skin  is warm and dry. Not diaphoretic.  Psychiatric: Has a normal mood and affect.  Diabetic foot exam: +2 DP pulses, no diabetic wounds, ulcerations or significant callousing. Monofilament exam nl.       Assessment & Plan:

## 2011-09-12 ENCOUNTER — Other Ambulatory Visit: Payer: Self-pay | Admitting: Internal Medicine

## 2011-11-19 LAB — BASIC METABOLIC PANEL
CO2: 24 mEq/L (ref 19–32)
Chloride: 106 mEq/L (ref 96–112)
Potassium: 4.9 mEq/L (ref 3.5–5.3)
Sodium: 140 mEq/L (ref 135–145)

## 2011-11-19 LAB — HEMOGLOBIN A1C: Mean Plasma Glucose: 163 mg/dL — ABNORMAL HIGH (ref <117)

## 2011-11-19 NOTE — Progress Notes (Signed)
Addended by: Mervin Kung A on: 11/19/2011 09:54 AM   Modules accepted: Orders

## 2011-11-26 ENCOUNTER — Ambulatory Visit (INDEPENDENT_AMBULATORY_CARE_PROVIDER_SITE_OTHER): Payer: Medicare Other | Admitting: Internal Medicine

## 2011-11-26 ENCOUNTER — Telehealth: Payer: Self-pay | Admitting: Internal Medicine

## 2011-11-26 ENCOUNTER — Encounter: Payer: Self-pay | Admitting: Internal Medicine

## 2011-11-26 VITALS — BP 112/66 | HR 75 | Temp 97.5°F | Resp 18 | Ht 69.0 in | Wt 195.0 lb

## 2011-11-26 DIAGNOSIS — H6690 Otitis media, unspecified, unspecified ear: Secondary | ICD-10-CM

## 2011-11-26 DIAGNOSIS — E119 Type 2 diabetes mellitus without complications: Secondary | ICD-10-CM

## 2011-11-26 DIAGNOSIS — M549 Dorsalgia, unspecified: Secondary | ICD-10-CM

## 2011-11-26 DIAGNOSIS — E785 Hyperlipidemia, unspecified: Secondary | ICD-10-CM

## 2011-11-26 MED ORDER — METHYLPREDNISOLONE ACETATE 20 MG/ML IJ SUSP
20.0000 mg | Freq: Once | INTRAMUSCULAR | Status: AC
  Administered 2011-11-26: 20 mg via INTRAMUSCULAR

## 2011-11-26 MED ORDER — AMOXICILLIN 500 MG PO CAPS: 500.0000 mg | ORAL_CAPSULE | Freq: Three times a day (TID) | ORAL | Status: AC

## 2011-11-26 NOTE — Patient Instructions (Signed)
Please schedule fasting labs prior to next visit Cbc, chem7, a1c, urine microalbumin 250.0 and lipid/lft 272.4 

## 2011-11-26 NOTE — Telephone Encounter (Signed)
Lab orders entered for August 2013. 

## 2011-12-01 DIAGNOSIS — M549 Dorsalgia, unspecified: Secondary | ICD-10-CM | POA: Insufficient documentation

## 2011-12-01 DIAGNOSIS — H6690 Otitis media, unspecified, unspecified ear: Secondary | ICD-10-CM | POA: Insufficient documentation

## 2011-12-01 NOTE — Assessment & Plan Note (Signed)
Follow appropriate diabetic diet and attempt wt loss.

## 2011-12-01 NOTE — Assessment & Plan Note (Signed)
Begin abx course. Followup if no improvement or worsening. 

## 2011-12-01 NOTE — Assessment & Plan Note (Signed)
Avoid nsaids. Attempt depomedrol injxn

## 2011-12-01 NOTE — Progress Notes (Signed)
  Subjective:    Patient ID: Gregory Farrell, male    DOB: [birth year], 78 y.o.   MRN: [id number]  HPI Pt presents to clinic for followup of multiple medical problems. Notes dietary indiscretion. Has right lbp without radicular sx's. Occurs with bending. Avoiding nsaids due to cri. Has two week h/o right ear pain without drainage, fever or chills.  Past Medical History  Diagnosis Date  . Hypertension   . COPD (chronic obstructive pulmonary disease)   . GERD (gastroesophageal reflux disease)   . Renal insufficiency   . Fatigue   . Asthma   . Hyperlipidemia   . CAD (coronary artery disease)   . B12 deficiency   . Memory loss   . Vertigo   . Diabetes mellitus     type II, poly neuropathy   Past Surgical History  Procedure Date  . Coronary angioplasty with stent placement     reports that he has quit smoking. He has never used smokeless tobacco. He reports that he does not drink alcohol. His drug history not on file. family history includes Breast cancer in an unspecified family member; Coronary artery disease in an unspecified family member; and Diabetes in an unspecified family member. Allergies  Allergen Reactions  . Exenatide     REACTION: nausea      Review of Systems see hpi     Objective:   Physical Exam  Physical Exam  Nursing note and vitals reviewed. Constitutional: Appears well-developed and well-nourished. No distress.  HENT:  Head: Normocephalic and atraumatic.  Right Ear: External ear normal. TM mildly red. Left Ear: External ear normal.  Eyes: Conjunctivae are normal. No scleral icterus.  Neck: Neck supple. Carotid bruit is not present.  Cardiovascular: Normal rate, regular rhythm and normal heart sounds.  Exam reveals no gallop and no friction rub.   No murmur heard. Pulmonary/Chest: Effort normal and breath sounds normal. No respiratory distress. He has no wheezes. no rales.  Lymphadenopathy:    He has no cervical adenopathy.  Neurological:Alert.  Skin:  Skin is warm and dry. Not diaphoretic.  Psychiatric: Has a normal mood and affect.          Assessment & Plan:

## 2011-12-15 ENCOUNTER — Other Ambulatory Visit: Payer: Self-pay | Admitting: Internal Medicine

## 2012-01-18 ENCOUNTER — Other Ambulatory Visit: Payer: Self-pay | Admitting: Internal Medicine

## 2012-02-18 LAB — CBC
HCT: 39.8 % (ref 39.0–52.0)
Hemoglobin: 13.4 g/dL (ref 13.0–17.0)
MCV: 91.5 fL (ref 78.0–100.0)
RBC: 4.35 MIL/uL (ref 4.22–5.81)
RDW: 13.7 % (ref 11.5–15.5)
WBC: 7.6 10*3/uL (ref 4.0–10.5)

## 2012-02-18 LAB — HEPATIC FUNCTION PANEL
ALT: 12 U/L (ref 0–53)
AST: 14 U/L (ref 0–37)
Bilirubin, Direct: 0.1 mg/dL (ref 0.0–0.3)
Indirect Bilirubin: 0.4 mg/dL (ref 0.0–0.9)
Total Protein: 6.4 g/dL (ref 6.0–8.3)

## 2012-02-18 NOTE — Telephone Encounter (Signed)
Lab orders released/SLS 

## 2012-02-18 NOTE — Addendum Note (Signed)
Addended by: Regis Bill on: 02/18/2012 09:40 AM   Modules accepted: Orders

## 2012-02-19 LAB — BASIC METABOLIC PANEL
BUN: 25 mg/dL — ABNORMAL HIGH (ref 6–23)
CO2: 24 mEq/L (ref 19–32)
Chloride: 105 mEq/L (ref 96–112)
Creat: 1.55 mg/dL — ABNORMAL HIGH (ref 0.50–1.35)
Potassium: 4.5 mEq/L (ref 3.5–5.3)

## 2012-02-19 LAB — LIPID PANEL
Cholesterol: 146 mg/dL (ref 0–200)
Total CHOL/HDL Ratio: 3.2 Ratio
Triglycerides: 143 mg/dL (ref <150)
VLDL: 29 mg/dL (ref 0–40)

## 2012-02-19 LAB — HEMOGLOBIN A1C: Mean Plasma Glucose: 166 mg/dL — ABNORMAL HIGH (ref <117)

## 2012-02-19 LAB — MICROALBUMIN / CREATININE URINE RATIO
Microalb Creat Ratio: 11.7 mg/g (ref 0.0–30.0)
Microalb, Ur: 1.74 mg/dL (ref 0.00–1.89)

## 2012-02-27 ENCOUNTER — Encounter: Payer: Self-pay | Admitting: Internal Medicine

## 2012-02-27 ENCOUNTER — Ambulatory Visit (INDEPENDENT_AMBULATORY_CARE_PROVIDER_SITE_OTHER): Payer: Medicare Other | Admitting: Internal Medicine

## 2012-02-27 ENCOUNTER — Telehealth: Payer: Self-pay | Admitting: Internal Medicine

## 2012-02-27 ENCOUNTER — Ambulatory Visit (HOSPITAL_BASED_OUTPATIENT_CLINIC_OR_DEPARTMENT_OTHER)
Admission: RE | Admit: 2012-02-27 | Discharge: 2012-02-27 | Disposition: A | Discharge status: Home / Self Care | Payer: Medicare Other | Source: Ambulatory Visit | Attending: Internal Medicine | Admitting: Internal Medicine

## 2012-02-27 VITALS — BP 134/90 | HR 81 | Temp 98.1°F | Resp 16 | Ht 69.0 in | Wt 197.0 lb

## 2012-02-27 DIAGNOSIS — M5137 Other intervertebral disc degeneration, lumbosacral region: Secondary | ICD-10-CM | POA: Insufficient documentation

## 2012-02-27 DIAGNOSIS — I714 Abdominal aortic aneurysm, without rupture, unspecified: Secondary | ICD-10-CM | POA: Insufficient documentation

## 2012-02-27 DIAGNOSIS — M545 Low back pain, unspecified: Secondary | ICD-10-CM | POA: Insufficient documentation

## 2012-02-27 DIAGNOSIS — S32009A Unspecified fracture of unspecified lumbar vertebra, initial encounter for closed fracture: Secondary | ICD-10-CM | POA: Insufficient documentation

## 2012-02-27 DIAGNOSIS — M549 Dorsalgia, unspecified: Secondary | ICD-10-CM

## 2012-02-27 DIAGNOSIS — M949 Disorder of cartilage, unspecified: Secondary | ICD-10-CM | POA: Insufficient documentation

## 2012-02-27 DIAGNOSIS — M47817 Spondylosis without myelopathy or radiculopathy, lumbosacral region: Secondary | ICD-10-CM | POA: Insufficient documentation

## 2012-02-27 DIAGNOSIS — M899 Disorder of bone, unspecified: Secondary | ICD-10-CM | POA: Insufficient documentation

## 2012-02-27 DIAGNOSIS — X58XXXA Exposure to other specified factors, initial encounter: Secondary | ICD-10-CM | POA: Insufficient documentation

## 2012-02-27 DIAGNOSIS — E119 Type 2 diabetes mellitus without complications: Secondary | ICD-10-CM

## 2012-02-27 DIAGNOSIS — M51379 Other intervertebral disc degeneration, lumbosacral region without mention of lumbar back pain or lower extremity pain: Secondary | ICD-10-CM | POA: Insufficient documentation

## 2012-02-27 MED ORDER — SITAGLIPTIN PHOSPHATE 50 MG PO TABS: 50.0000 mg | ORAL_TABLET | Freq: Every day | ORAL | Status: DC

## 2012-02-27 MED ORDER — ROSUVASTATIN CALCIUM 20 MG PO TABS: 20.0000 mg | ORAL_TABLET | Freq: Every day | ORAL | Status: DC

## 2012-02-27 NOTE — Patient Instructions (Signed)
Please schedule fasting labs prior to next visit Chem7, a1c-250.00

## 2012-03-01 NOTE — Assessment & Plan Note (Signed)
Recommend fsbs checks and he declines. Recommend amaryl 1mg  po qd and pt declines. Advised need for appropriate diabetic diet, exercise and wt loss.

## 2012-03-01 NOTE — Progress Notes (Signed)
  Subjective:    Patient ID: Gregory Farrell, male    DOB: [birth year], 78 y.o.   MRN: [id number]  HPI Pt presents to clinic for followup of multiple medical problems. Notes intermittent right lbp that radiates right leg. No leg weakness or injury. Not checking fsbs. a1c remains elevated. Admits to dietary indiscretion.  Past Medical History  Diagnosis Date  . Hypertension   . COPD (chronic obstructive pulmonary disease)   . GERD (gastroesophageal reflux disease)   . Renal insufficiency   . Fatigue   . Asthma   . Hyperlipidemia   . CAD (coronary artery disease)   . B12 deficiency   . Memory loss   . Vertigo   . Diabetes mellitus     type II, poly neuropathy   Past Surgical History  Procedure Date  . Coronary angioplasty with stent placement     reports that he has quit smoking. He has never used smokeless tobacco. He reports that he does not drink alcohol. His drug history not on file. family history includes Breast cancer in an unspecified family member; Coronary artery disease in an unspecified family member; and Diabetes in an unspecified family member. Allergies  Allergen Reactions  . Exenatide     REACTION: nausea      Review of Systems see hpi     Objective:   Physical Exam  Nursing note and vitals reviewed. Constitutional: He appears well-developed and well-nourished. No distress.  HENT:  Head: Normocephalic and atraumatic.  Eyes: Conjunctivae are normal. No scleral icterus.  Cardiovascular: Normal rate, regular rhythm and normal heart sounds.  Exam reveals no gallop and no friction rub.   No murmur heard. Pulmonary/Chest: Effort normal and breath sounds normal. No respiratory distress. He has no wheezes. He has no rales.  Musculoskeletal:       Bilateral le strength 5/5. Modified slr neg. Gait nl  Neurological: He is alert.  Skin: He is not diaphoretic.  Psychiatric: He has a normal mood and affect.          Assessment & Plan:

## 2012-03-01 NOTE — Assessment & Plan Note (Signed)
Obtain ls xray. Schedule PT. Handicap parking placard provided. Followup if no improvement or worsening.

## 2012-03-02 ENCOUNTER — Telehealth: Payer: Self-pay | Admitting: *Deleted

## 2012-03-02 DIAGNOSIS — I714 Abdominal aortic aneurysm, without rupture, unspecified: Secondary | ICD-10-CM

## 2012-03-02 NOTE — Telephone Encounter (Signed)
Lumbar Spine Xray: IMPRESSION:  1. Moderate to severe L4-5 degenerative disc disease.  2. Mild bilateral lower lumbar facet DJD.  3. Osteopenia and old L2 superior endplate compression deformity.  4. Mild distal abdominal aortic aneurysm measuring approximately  3.0 cm.  Original Report Authenticated By: Danae Orleans, M.D.  US Abdomen Complete ordered per patient request/SLS P[facility]/Helen informed/SLS

## 2012-03-02 NOTE — Telephone Encounter (Signed)
Message copied by Regis Bill on Mon Mar 02, 2012 12:41 PM ------      Message from: Edwyna Perfect      Created: Fri Feb 28, 2012  4:54 PM       Back xray shows old compression fracture and moderately severe degenerative changes. Radiologist thinks he sees a small aneurysm in the abdomen-recommend evaluating that with abd [place]

## 2012-03-03 ENCOUNTER — Ambulatory Visit (HOSPITAL_BASED_OUTPATIENT_CLINIC_OR_DEPARTMENT_OTHER)
Admission: RE | Admit: 2012-03-03 | Discharge: 2012-03-03 | Disposition: A | Discharge status: Home / Self Care | Payer: Medicare Other | Source: Ambulatory Visit | Attending: Internal Medicine | Admitting: Internal Medicine

## 2012-03-03 DIAGNOSIS — R143 Flatulence: Secondary | ICD-10-CM | POA: Insufficient documentation

## 2012-03-03 DIAGNOSIS — R142 Eructation: Secondary | ICD-10-CM | POA: Insufficient documentation

## 2012-03-03 DIAGNOSIS — I714 Abdominal aortic aneurysm, without rupture, unspecified: Secondary | ICD-10-CM

## 2012-03-03 DIAGNOSIS — R141 Gas pain: Secondary | ICD-10-CM | POA: Insufficient documentation

## 2012-03-18 ENCOUNTER — Other Ambulatory Visit: Payer: Self-pay | Admitting: Internal Medicine

## 2012-03-19 NOTE — Telephone Encounter (Signed)
Please schedule fasting labs prior to next visit  Chem7, a1c-250.00  Future orders entered and copy given to the lab.

## 2012-04-24 ENCOUNTER — Telehealth: Payer: Self-pay | Admitting: *Deleted

## 2012-04-24 DIAGNOSIS — E119 Type 2 diabetes mellitus without complications: Secondary | ICD-10-CM

## 2012-04-24 DIAGNOSIS — E785 Hyperlipidemia, unspecified: Secondary | ICD-10-CM

## 2012-04-24 NOTE — Telephone Encounter (Signed)
Pt called requesting generic rx for his Crestor and Januvia. Pt states the medication is too expensive. Please advise.

## 2012-04-26 NOTE — Telephone Encounter (Signed)
Closest generic to crestor with enough strength would be lipitor 80mg  po qd. #30 rf6. If changes will need lipid/lft 272.4 4wks after change. [facility] has 2 other related drugs but none are generic. See if we have samples to give until he comes in for follow up to discuss. Insulin is always a possibility

## 2012-04-27 ENCOUNTER — Other Ambulatory Visit: Payer: Self-pay | Admitting: Internal Medicine

## 2012-04-27 MED ORDER — SITAGLIPTIN PHOSPHATE 50 MG PO TABS: 50.0000 mg | ORAL_TABLET | Freq: Every day | ORAL | Status: DC

## 2012-04-27 MED ORDER — ATORVASTATIN CALCIUM 80 MG PO TABS: 80.0000 mg | ORAL_TABLET | Freq: Every day | ORAL | Status: DC

## 2012-04-27 NOTE — Telephone Encounter (Signed)
Notified pt, he is agreeable to proceed with lipitor. Rx sent to [facility]. Pt will return to the lab around 4 weeks for lipid/lfts. Future orders placed. Copy given to the lab and mailed to pt. 2 boxes of Januvia left at front desk for pt to pick up.

## 2012-04-28 ENCOUNTER — Other Ambulatory Visit: Payer: Self-pay | Admitting: Internal Medicine

## 2012-04-28 NOTE — Telephone Encounter (Signed)
Please advise re: request below:  I do not see recent refill by [place]. Current med list says to take 5mg  by mouth daily.  Please advise.  Medication name:  Name from pharmacy:  amLODipine (NORVASC) 10 MG tablet  AMLODIPINE 10MG  TAB ASCE The source prescription has been discontinued. Sig: TAKE ONE TABLET BY MOUTH ONE TIME DAILY Dispense: 90 tablet Refills: 0 Start: 04/27/2012 Class: Normal Requested on: 04/19/2011 Originally ordered on: 08/23/2010 Last refill: 10/15/2011

## 2012-04-29 NOTE — Telephone Encounter (Signed)
Rx done/SLS 

## 2012-04-29 NOTE — Telephone Encounter (Signed)
Ok #90 rf1 

## 2012-05-01 MED ORDER — AMLODIPINE BESYLATE 10 MG PO TABS: 5.0000 mg | ORAL_TABLET | Freq: Every day | ORAL | Status: DC

## 2012-05-01 NOTE — Telephone Encounter (Signed)
Verified with pt that he currently takes 1/2 of a 10mg  tablet daily. Denied current request from pharmacy as instructions are for 10mg  1 tablet daily. Refill sent with updated instructions; #45 x 1 refill.

## 2012-05-19 LAB — HEPATIC FUNCTION PANEL
ALT: 17 U/L (ref 0–53)
AST: 19 U/L (ref 0–37)
Albumin: 3.8 g/dL (ref 3.5–5.2)
Alkaline Phosphatase: 91 U/L (ref 39–117)
Total Protein: 6.5 g/dL (ref 6.0–8.3)

## 2012-05-19 LAB — BASIC METABOLIC PANEL
BUN: 24 mg/dL — ABNORMAL HIGH (ref 6–23)
CO2: 24 mEq/L (ref 19–32)
Chloride: 105 mEq/L (ref 96–112)
Creat: 1.64 mg/dL — ABNORMAL HIGH (ref 0.50–1.35)

## 2012-05-19 NOTE — Telephone Encounter (Signed)
Pt presented to the lab, future orders released. 

## 2012-05-19 NOTE — Addendum Note (Signed)
Addended by: Mervin Kung A on: 05/19/2012 10:12 AM   Modules accepted: Orders

## 2012-05-27 ENCOUNTER — Telehealth: Payer: Self-pay | Admitting: *Deleted

## 2012-05-27 ENCOUNTER — Encounter: Payer: Self-pay | Admitting: Internal Medicine

## 2012-05-27 ENCOUNTER — Ambulatory Visit (INDEPENDENT_AMBULATORY_CARE_PROVIDER_SITE_OTHER): Payer: Medicare Other | Admitting: Internal Medicine

## 2012-05-27 VITALS — BP 126/82 | HR 88 | Temp 98.2°F | Resp 20 | Wt 198.2 lb

## 2012-05-27 DIAGNOSIS — I739 Peripheral vascular disease, unspecified: Secondary | ICD-10-CM

## 2012-05-27 DIAGNOSIS — M549 Dorsalgia, unspecified: Secondary | ICD-10-CM

## 2012-05-27 DIAGNOSIS — E119 Type 2 diabetes mellitus without complications: Secondary | ICD-10-CM

## 2012-05-27 MED ORDER — GLIMEPIRIDE 2 MG PO TABS: 2.0000 mg | ORAL_TABLET | Freq: Every day | ORAL | Status: DC

## 2012-05-27 NOTE — Assessment & Plan Note (Signed)
Suboptimal control and worsening. Patient willing to address additional medication. Add Amaryl 2 mg daily. Recommend daily fingerstick blood sugar and currently patient declines. Discussed potential for hypoglycemia and asked him to reconsider

## 2012-05-27 NOTE — Progress Notes (Signed)
  Subjective:    Patient ID: Gregory Farrell, male    DOB: 03-07-1933, 78 y.o.   MRN: [id number]  HPI Pt presents to clinic for followup of multiple medical problems. Reviewed increasing in suboptimal A1c down to 7.8. Continues to decline home monitoring of blood sugar with fingerstick blood sugar. Previously declined additional medication however is now willing to readdress. Blood pressure reviewed as normotensive. Crestor was changed to Lipitor due to cost consideration and LDL at goal with normal liver function tests. States his back pain is improved with physical therapy. Does have intermittent right leg pain and fatigability with exercise. Symptoms resolve with rest. No known history of PAD.  Past Medical History  Diagnosis Date  . Hypertension   . COPD (chronic obstructive pulmonary disease)   . GERD (gastroesophageal reflux disease)   . Renal insufficiency   . Fatigue   . Asthma   . Hyperlipidemia   . CAD (coronary artery disease)   . B12 deficiency   . Memory loss   . Vertigo   . Diabetes mellitus     type II, poly neuropathy   Past Surgical History  Procedure Date  . Coronary angioplasty with stent placement     reports that he has quit smoking. He has never used smokeless tobacco. He reports that he does not drink alcohol. His drug history not on file. family history includes Breast cancer in an unspecified family member; Coronary artery disease in an unspecified family member; and Diabetes in an unspecified family member. Allergies  Allergen Reactions  . Exenatide     REACTION: nausea      Review of Systems see hpi     Objective:   Physical Exam  Physical Exam  Nursing note and vitals reviewed. Constitutional: Appears well-developed and well-nourished. No distress.  HENT:  Head: Normocephalic and atraumatic.  Right Ear: External ear normal.  Left Ear: External ear normal.  Eyes: Conjunctivae are normal. No scleral icterus.  Neck: Neck supple. Carotid bruit is not  present.  Cardiovascular: Normal rate, regular rhythm and normal heart sounds.  Exam reveals no gallop and no friction rub.   No murmur heard. Pulmonary/Chest: Effort normal and breath sounds normal. No respiratory distress. He has no wheezes. no rales.  Lymphadenopathy:    He has no cervical adenopathy.  Neurological:Alert.  Skin: Skin is warm and dry. Not diaphoretic.  Psychiatric: Has a normal mood and affect.  Extremities: Trace bilateral DP      Assessment & Plan:

## 2012-05-27 NOTE — Assessment & Plan Note (Signed)
Improved

## 2012-05-27 NOTE — Patient Instructions (Signed)
Please schedule fasting labs prior to next visit Cbc, chem7, a1c-250.00

## 2012-05-27 NOTE — Telephone Encounter (Signed)
Received notification in EPIC that glimepiride escribe did not complete transmission to the pharmacy. Called rx to pharmacy voicemail per previous order;   # 30 x 6 refills.

## 2012-05-27 NOTE — Assessment & Plan Note (Signed)
Symptoms suggestive of claudication. Did discuss with patient may represent also radicular pain from his back. Proceed with lower extremity vascular imaging

## 2012-06-02 ENCOUNTER — Other Ambulatory Visit: Payer: Self-pay | Admitting: Internal Medicine

## 2012-06-02 DIAGNOSIS — I739 Peripheral vascular disease, unspecified: Secondary | ICD-10-CM

## 2012-06-08 ENCOUNTER — Other Ambulatory Visit: Payer: Self-pay | Admitting: Internal Medicine

## 2012-06-08 NOTE — Telephone Encounter (Signed)
#  90 rf2 

## 2012-06-08 NOTE — Telephone Encounter (Signed)
Celexa request [Last Rx 09.04.13 #90x0]/SLS Please advise.

## 2012-06-08 NOTE — Telephone Encounter (Signed)
Rx to pharmacy/SLS 

## 2012-06-12 ENCOUNTER — Encounter (INDEPENDENT_AMBULATORY_CARE_PROVIDER_SITE_OTHER): Payer: Medicare Other

## 2012-06-12 ENCOUNTER — Encounter: Payer: Self-pay | Admitting: Cardiology

## 2012-06-12 DIAGNOSIS — I739 Peripheral vascular disease, unspecified: Secondary | ICD-10-CM

## 2012-06-12 DIAGNOSIS — I70219 Atherosclerosis of native arteries of extremities with intermittent claudication, unspecified extremity: Secondary | ICD-10-CM

## 2012-07-21 ENCOUNTER — Telehealth: Payer: Self-pay | Admitting: *Deleted

## 2012-07-21 NOTE — Telephone Encounter (Signed)
Spoke with pt's wife [pt unavailable] about this matter & informed her that pt will need OV for A&E regarding pt request; spouse stated that when pt informed this by CAN [triage] that he stated that "he would just wait until his appointment next month in February". [[Patient has been non-compliant in DM management; previous decline of addt'l medication recommended by provider & still declines recommended fingerstick glucose check daily]]. Informed spouse that this would be pt's choice & he can call to schedule earlier appointment if he would like to; understood & agreed to inform pt when available/SLS

## 2012-07-21 NOTE — Telephone Encounter (Signed)
[facility] Triage Call Report Triage Record Num: [id number] Operator: Tomasita Crumble Patient Name: Gregory Farrell Call Date & Time: 07/20/2012 5:52:03PM Patient Phone: [phone] PCP: Patient Gender: Male PCP Fax : Patient DOB: June 20, 1933 Practice Name: [facility] Reason for Call: Caller: Hilda/Spouse; PCP: Marguarite Arbour (Adults only); CB#: [phone]; Call regarding Asks for Rx for diabetic shoes. Has follow up on February. Reports foot pain when he walks a lot. Emergent symptoms ruled out. Advised home care for the interim and parameters for callback per Diabetes: Foot Problems protocol. Follow up with provider within 2 weeks per protocol. Protocol(s) Used: Diabetes: Foot Problems Recommended Outcome per Protocol: See Provider within 2 Weeks Reason for Outcome: All other situations Care Advice: ~ While on a prolonged walk or daily activity, examine your feet periodically for any evidence of friction or irritation. Call provider if symptoms worsen or if evidence of infection occurs (such as redness, warmth, tenderness, swelling; may have red streaks or purulent drainage). ~ Do not apply excessive warmth or cold to the area as sensation may be impaired. Do not use a heating pad or hot water. ~ Diabetic Foot Care: * Do not walk barefooted at any time. * Avoid activities that put pressure on your feet or toes; especially those involving sudden stops that cause "toe jamming." * Wear well-fitted, padded walking or athletic shoes with good arch supports that protect feet from injury. * Shoes with extra width or depth may be necessary. * Wear non-restricting, clean socks that wick moisture away from skin such as a cotton/synthetic blend. Change socks daily or more often if your feet sweat. * Under provider supervision, use pads or inserts to help protect any sore areas and redistribute pressure. * Never use nonprescriptive liquid corn removers or medicated corn pads until discussing  with your provider (should not be used if you have a diagnosed circulatory problem). ~ Foot Health: * Wash feet every day and dry thoroughly between toes. * Lubricate dry skin using nongreasy lotion or cream to avoid cracking; do not lubricate between toes. *Check for any irritation, open area, discoloration, note any change in sensation. * Pay special attention to skin between toes and underside of toes/feet. Use mirror to see bottom of feet. * Trim toenails straight across, do not trim too short or cut down corners. * If your vision is poor, have someone examine your feet and trim your nails. * Have feet examined yearly by a provider. Always have any foot problem examined promptly. ~ Diabetes Action Plan: - Follow recommended action plan and diet - Check blood sugar as recommended - Take diabetic pills or insulin as ordered - Follow action plan for illness

## 2012-08-18 ENCOUNTER — Telehealth: Payer: Self-pay | Admitting: *Deleted

## 2012-08-18 DIAGNOSIS — E119 Type 2 diabetes mellitus without complications: Secondary | ICD-10-CM

## 2012-08-18 LAB — BASIC METABOLIC PANEL
BUN: 27 mg/dL — ABNORMAL HIGH (ref 6–23)
Chloride: 106 mEq/L (ref 96–112)
Creat: 1.6 mg/dL — ABNORMAL HIGH (ref 0.50–1.35)
Glucose, Bld: 135 mg/dL — ABNORMAL HIGH (ref 70–99)

## 2012-08-18 LAB — CBC WITH DIFFERENTIAL/PLATELET
Basophils Absolute: 0.1 10*3/uL (ref 0.0–0.1)
Basophils Relative: 1 % (ref 0–1)
Eosinophils Relative: 6 % — ABNORMAL HIGH (ref 0–5)
HCT: 40.6 % (ref 39.0–52.0)
MCHC: 34 g/dL (ref 30.0–36.0)
MCV: 89.6 fL (ref 78.0–100.0)
Monocytes Absolute: 0.6 10*3/uL (ref 0.1–1.0)
RDW: 14 % (ref 11.5–15.5)

## 2012-08-18 LAB — HEMOGLOBIN A1C
Hgb A1c MFr Bld: 8.3 % — ABNORMAL HIGH (ref <5.7)
Mean Plasma Glucose: 192 mg/dL — ABNORMAL HIGH (ref <117)

## 2012-08-18 NOTE — Telephone Encounter (Signed)
Pt presented to the lab. Orders entered per 05/2012 office note as below:  Please schedule fasting labs prior to next visit  Cbc, chem7, a1c-250.00

## 2012-08-25 ENCOUNTER — Ambulatory Visit (INDEPENDENT_AMBULATORY_CARE_PROVIDER_SITE_OTHER): Payer: Medicare Other | Admitting: Family Medicine

## 2012-08-25 ENCOUNTER — Encounter: Payer: Self-pay | Admitting: Family Medicine

## 2012-08-25 VITALS — BP 132/84 | HR 81 | Temp 97.6°F | Ht 69.0 in | Wt 207.0 lb

## 2012-08-25 DIAGNOSIS — I2510 Atherosclerotic heart disease of native coronary artery without angina pectoris: Secondary | ICD-10-CM

## 2012-08-25 DIAGNOSIS — I499 Cardiac arrhythmia, unspecified: Secondary | ICD-10-CM

## 2012-08-25 DIAGNOSIS — R0602 Shortness of breath: Secondary | ICD-10-CM

## 2012-08-25 DIAGNOSIS — I10 Essential (primary) hypertension: Secondary | ICD-10-CM

## 2012-08-25 DIAGNOSIS — E119 Type 2 diabetes mellitus without complications: Secondary | ICD-10-CM

## 2012-08-25 NOTE — Patient Instructions (Addendum)

## 2012-08-30 NOTE — Assessment & Plan Note (Signed)
hgba1c 8.3, encouraged minimize simple carbs and monitor sugars, no changes today

## 2012-08-30 NOTE — Assessment & Plan Note (Signed)
Is struggling with increased DOE and has not seen cardiology in years. With his multiple risk factors will refer for evaluation and he will seek care immediately if more symptoms develop.

## 2012-08-30 NOTE — Progress Notes (Signed)
Patient ID: Gregory Farrell, male   DOB: [birth year], 79 y.o.   MRN: [id number] BREK REECE [id number] May 12, 1933 08/30/2012      Progress Note-Follow Up  Subjective  Chief Complaint  Chief Complaint  Patient presents with  . Follow-up    3 month    HPI  Patient is a 79 year old Caucasian male who is in today for followup. His major complaint is of worsening dyspnea on exertion over several months. He denies any chest pain or palpitations. No orthopnea. No diaphoresis or nausea. Had some minor nasal congestion but no chest congestion or wheezing. No sore throat no headache no ear pain. He reports otherwise generally feeling well and offers no other acute complaints  Past Medical History  Diagnosis Date  . Hypertension   . COPD (chronic obstructive pulmonary disease)   . GERD (gastroesophageal reflux disease)   . Renal insufficiency   . Fatigue   . Asthma   . Hyperlipidemia   . CAD (coronary artery disease)   . B12 deficiency   . Memory loss   . Vertigo   . Diabetes mellitus     type II, poly neuropathy    Past Surgical History  Procedure Laterality Date  . Coronary angioplasty with stent placement      Family History  Problem Relation Age of Onset  . Coronary artery disease    . Breast cancer    . Diabetes      History   Social History  . Marital Status: Married    Spouse Name: N/A    Number of Children: N/A  . Years of Education: N/A   Occupational History  . Retired    Social History Main Topics  . Smoking status: Former Smoker -- 1.00 packs/day for 10 years  . Smokeless tobacco: Never Used  . Alcohol Use: No  . Drug Use: Not on file  . Sexually Active: Not on file   Other Topics Concern  . Not on file   Social History Narrative  . No narrative on file    Current Outpatient Prescriptions on File Prior to Visit  Medication Sig Dispense Refill  . amLODipine (NORVASC) 10 MG tablet Take 0.5 tablets (5 mg total) by mouth daily.  45 tablet  1  . aspirin  81 MG tablet Take 81 mg by mouth daily.        . citalopram (CELEXA) 20 MG tablet TAKE ONE TABLET BY MOUTH ONE TIME DAILY  90 tablet  2  . glimepiride (AMARYL) 2 MG tablet Take 1 tablet (2 mg total) by mouth daily before breakfast.  30 tablet  6  . lisinopril (PRINIVIL,ZESTRIL) 10 MG tablet Take 0.5 tablets (5 mg total) by mouth daily.  45 tablet  1  . omeprazole (PRILOSEC) 20 MG capsule TAKE ONE CAPSULE BY MOUTH ONE TIME DAILY  90 capsule  1  . sitaGLIPtin (JANUVIA) 50 MG tablet Take 1 tablet (50 mg total) by mouth daily.  28 tablet  0   No current facility-administered medications on file prior to visit.    Allergies  Allergen Reactions  . Exenatide     REACTION: nausea    Review of Systems  Review of Systems  Constitutional: Negative for fever and malaise/fatigue.  HENT: Positive for congestion. Negative for sore throat.   Eyes: Negative for discharge.  Respiratory: Positive for shortness of breath. Negative for wheezing.   Cardiovascular: Negative for chest pain, palpitations, orthopnea, claudication, leg swelling and PND.  Gastrointestinal: Negative for nausea,  abdominal pain and diarrhea.  Genitourinary: Negative for dysuria.  Musculoskeletal: Negative for falls.  Skin: Negative for rash.  Neurological: Negative for loss of consciousness and headaches.  Endo/Heme/Allergies: Negative for polydipsia.  Psychiatric/Behavioral: Negative for depression and suicidal ideas. The patient is not nervous/anxious and does not have insomnia.      Objective  BP 132/84  Pulse 81  Temp(Src) 97.6 F (36.4 C) (Oral)  Ht 5\' 9"  (1.753 m)  Wt 207 lb (93.895 kg)  BMI 30.55 kg/m2  SpO2 96%  Physical Exam  Physical Exam  Constitutional: He is oriented to person, place, and time and well-developed, well-nourished, and in no distress. No distress.  HENT:  Head: Normocephalic and atraumatic.  Eyes: Conjunctivae are normal.  Neck: Neck supple. No thyromegaly present.  Cardiovascular:  Normal rate, regular rhythm and normal heart sounds.   No murmur heard. Pulmonary/Chest: Effort normal and breath sounds normal. No respiratory distress.  Abdominal: He exhibits no distension and no mass. There is no tenderness.  Musculoskeletal: He exhibits no edema.  Neurological: He is alert and oriented to person, place, and time.  Skin: Skin is warm.  Psychiatric: Memory, affect and judgment normal.    Lab Results  Component Value Date   TSH 1.024 03/14/2009   Lab Results  Component Value Date   WBC 6.4 08/18/2012   HGB 13.8 08/18/2012   HCT 40.6 08/18/2012   MCV 89.6 08/18/2012   PLT 175 08/18/2012   Lab Results  Component Value Date   CREATININE 1.60* 08/18/2012   BUN 27* 08/18/2012   NA 139 08/18/2012   K 4.9 08/18/2012   CL 106 08/18/2012   CO2 27 08/18/2012   Lab Results  Component Value Date   ALT 17 05/19/2012   AST 19 05/19/2012   ALKPHOS 91 05/19/2012   BILITOT 0.7 05/19/2012   Lab Results  Component Value Date   CHOL 142 05/19/2012   Lab Results  Component Value Date   HDL 41 05/19/2012   Lab Results  Component Value Date   LDLCALC 70 05/19/2012   Lab Results  Component Value Date   TRIG 156* 05/19/2012   Lab Results  Component Value Date   CHOLHDL 3.5 05/19/2012     Assessment & Plan  HYPERTENSION Adequately controlled, no changes today  DYSPNEA Worsening gradually over several months.No significant URI symptoms  DIABETES MELLITUS, TYPE II hgba1c 8.3, encouraged minimize simple carbs and monitor sugars, no changes today  CORONARY ARTERY DISEASE Is struggling with increased DOE and has not seen cardiology in years. With his multiple risk factors will refer for evaluation and he will seek care immediately if more symptoms develop.

## 2012-08-30 NOTE — Assessment & Plan Note (Signed)
Worsening gradually over several months.No significant URI symptoms

## 2012-08-30 NOTE — Assessment & Plan Note (Signed)
Adequately controlled, no changes today 

## 2012-09-07 ENCOUNTER — Telehealth: Payer: Self-pay | Admitting: Family Medicine

## 2012-09-07 NOTE — Telephone Encounter (Signed)
So he can move his appt if he is feeling better til after he sees cardiology, if he is still feeling poorly maybe he should keep his appt with me

## 2012-09-07 NOTE — Telephone Encounter (Signed)
Please advise 

## 2012-09-07 NOTE — Telephone Encounter (Signed)
Caller: Gregory Farrell/Patient; Phone: [phone]; Reason for Call: Patient stats he has an appt scheduled with Dr Abner Greenspan on March 11th.  Patient states he thought Dr Abner Greenspan wanted him to see the cardiologist before his next appt but he cannot get into see Dr Jens Som until March 26th.  Patient wants to know does Dr Abner Greenspan still want to see him on 3/11 and if so, does he need to come in the week before for lab work?  OFFICE PLEASE FOLLOW UP WITH PATIENT

## 2012-09-08 NOTE — Telephone Encounter (Signed)
Pt states he feels better and call was transferred to [facility] to reschedule appt.

## 2012-09-22 ENCOUNTER — Ambulatory Visit: Payer: Medicare Other | Admitting: Family Medicine

## 2012-10-07 ENCOUNTER — Ambulatory Visit (HOSPITAL_BASED_OUTPATIENT_CLINIC_OR_DEPARTMENT_OTHER)
Admission: RE | Admit: 2012-10-07 | Discharge: 2012-10-07 | Disposition: A | Discharge status: Home / Self Care | Payer: Medicare Other | Source: Ambulatory Visit | Attending: Cardiology | Admitting: Cardiology

## 2012-10-07 ENCOUNTER — Encounter: Payer: Self-pay | Admitting: Cardiology

## 2012-10-07 ENCOUNTER — Ambulatory Visit (INDEPENDENT_AMBULATORY_CARE_PROVIDER_SITE_OTHER): Payer: Medicare Other | Admitting: Cardiology

## 2012-10-07 VITALS — BP 128/80 | HR 80 | Wt 208.0 lb

## 2012-10-07 DIAGNOSIS — R0609 Other forms of dyspnea: Secondary | ICD-10-CM

## 2012-10-07 DIAGNOSIS — E785 Hyperlipidemia, unspecified: Secondary | ICD-10-CM

## 2012-10-07 DIAGNOSIS — I10 Essential (primary) hypertension: Secondary | ICD-10-CM

## 2012-10-07 DIAGNOSIS — R0602 Shortness of breath: Secondary | ICD-10-CM | POA: Insufficient documentation

## 2012-10-07 DIAGNOSIS — I2510 Atherosclerotic heart disease of native coronary artery without angina pectoris: Secondary | ICD-10-CM

## 2012-10-07 DIAGNOSIS — R0989 Other specified symptoms and signs involving the circulatory and respiratory systems: Secondary | ICD-10-CM

## 2012-10-07 DIAGNOSIS — R0600 Dyspnea, unspecified: Secondary | ICD-10-CM

## 2012-10-07 NOTE — Assessment & Plan Note (Signed)
Continue aspirin and statin. 

## 2012-10-07 NOTE — Assessment & Plan Note (Signed)
Etiology unclear. Will schedule myoview to exclude anginal equivalent. Schedule echocardiogram to assess LV function. I will also check a PA and lateral chest x-ray. Patient does have a history of COPD.

## 2012-10-07 NOTE — Progress Notes (Signed)
HPI: 79 year old male with past medical history of coronary artery disease for evaluation of dyspnea. His last catheterization was performed in January 2004, his ejection fraction was 65%.  There was a 30-50% left main.  The Cypher stent in the LAD was widely patent.  There was an eccentric area of stenosis of 50-70% in the LAD that was unchanged from previous.  The distal obtuse marginal at its origin from a bifurcation with his second obtuse marginal was totally occluded, both filled via left-to-left collaterals and there was also right-to-left collaterals.  The previous OM at the site of PCI was total.  The right coronary artery had a 50% lesion.  His last Myoview was performed on April 02, 2007.  At that time, his ejection fraction was 65%.  There was very mild ischemia in the inferior wall and probable soft tissue attenuation as well. Renal Dopplers in may of 2011 showed no renal artery stenosis. Echocardiogram in September of 2011 was extremely limited. LV function appeared to be preserved but focal wall motion abnormality could not be excluded. Abdominal ultrasound in August of 2013 showed no aneurysm but technically difficult. ABIs in November of 2013 showed isolated right anterior tibial artery disease. BUN and creatinine in February of 2014 27/1.60. Patient describes increasing dyspnea on exertion for approximately one year. There is no orthopnea, PND, pedal edema, palpitations, syncope or exertional chest pain. He denies claudication but does have weakness with ambulation.  Current Outpatient Prescriptions  Medication Sig Dispense Refill  . amLODipine (NORVASC) 10 MG tablet Take 0.5 tablets (5 mg total) by mouth daily.  45 tablet  1  . aspirin 81 MG tablet Take 81 mg by mouth daily.        Marland Kitchen atorvastatin (LIPITOR) 80 MG tablet Take 80 mg by mouth daily.      . citalopram (CELEXA) 20 MG tablet TAKE ONE TABLET BY MOUTH ONE TIME DAILY  90 tablet  2  . glimepiride (AMARYL) 2 MG tablet Take 1  tablet (2 mg total) by mouth daily before breakfast.  30 tablet  6  . lisinopril (PRINIVIL,ZESTRIL) 10 MG tablet Take 0.5 tablets (5 mg total) by mouth daily.  45 tablet  1  . omeprazole (PRILOSEC) 20 MG capsule TAKE ONE CAPSULE BY MOUTH ONE TIME DAILY  90 capsule  1  . sitaGLIPtin (JANUVIA) 50 MG tablet Take 1 tablet (50 mg total) by mouth daily.  28 tablet  0   No current facility-administered medications for this visit.    Allergies  Allergen Reactions  . Exenatide     REACTION: nausea    Past Medical History  Diagnosis Date  . Hypertension   . COPD (chronic obstructive pulmonary disease)   . GERD (gastroesophageal reflux disease)   . Renal insufficiency   . Asthma   . Hyperlipidemia   . CAD (coronary artery disease)   . B12 deficiency   . Memory loss   . Vertigo   . Diabetes mellitus     type II, poly neuropathy    Past Surgical History  Procedure Laterality Date  . Coronary angioplasty with stent placement    . Inguinal hernia repair      History   Social History  . Marital Status: Married    Spouse Name: N/A    Number of Children: 1  . Years of Education: N/A   Occupational History  . Retired    Social History Main Topics  . Smoking status: Former Smoker -- 1.00 packs/day for 10  years  . Smokeless tobacco: Never Used  . Alcohol Use: No  . Drug Use: Not on file  . Sexually Active: Not on file   Other Topics Concern  . Not on file   Social History Narrative  . No narrative on file    Family History  Problem Relation Age of Onset  . Coronary artery disease    . Breast cancer    . Diabetes      ROS: no fevers or chills, productive cough, hemoptysis, dysphasia, odynophagia, melena, hematochezia, dysuria, hematuria, rash, seizure activity, orthopnea, PND, pedal edema, claudication. Remaining systems are negative.  Physical Exam:   Blood pressure 128/80, pulse 80, weight 208 lb (94.348 kg).  General:  Well developed/well nourished in NAD Skin  warm/dry Patient not depressed No peripheral clubbing Back-normal HEENT-normal/normal eyelids Neck supple/normal carotid upstroke bilaterally; no bruits; no JVD; no thyromegaly chest - CTA/ normal expansion CV - distant heart sounds, RRR/normal S1 and S2; no murmurs, rubs or gallops;  PMI nondisplaced Abdomen -NT/ND, no HSM, no mass, + bowel sounds, no bruit 2+ femoral pulses, no bruits Ext-no edema, chords; diminished distal pulses Neuro-grossly nonfocal  ECG 08/25/2012-sinus rhythm, first degree AV block, right bundle branch block, prior septal infarct, prior lateral infarct.

## 2012-10-07 NOTE — Assessment & Plan Note (Signed)
Blood pressure controlled. Continue present medications. 

## 2012-10-07 NOTE — Patient Instructions (Addendum)
Your physician recommends that you schedule a follow-up appointment in: 4 WEEKS WITH DR CRENSHAW IN [place]   Your physician has requested that you have a lexiscan myoview. For further information please visit [email or web address] Please follow instruction sheet, as given.AT THE [facility] OFFICE   Your physician has requested that you have an echocardiogram. Echocardiography is a painless test that uses sound waves to create images of your heart. It provides your doctor with information about the size and shape of your heart and how well your heart's chambers and valves are working. This procedure takes approximately one hour. There are no restrictions for this procedure.AT THE [place] OFFICE   A chest x-ray takes a picture of the organs and structures inside the chest, including the heart, lungs, and blood vessels. This test can show several things, including, whether the heart is enlarges; whether fluid is building up in the lungs; and whether pacemaker / defibrillator leads are still in place. IN [place] OFFICE

## 2012-10-07 NOTE — Assessment & Plan Note (Signed)
Continue statin. 

## 2012-10-08 ENCOUNTER — Ambulatory Visit: Payer: Medicare Other | Admitting: Cardiology

## 2012-10-13 ENCOUNTER — Ambulatory Visit (HOSPITAL_COMMUNITY): Payer: Medicare Other | Attending: Cardiology | Admitting: Radiology

## 2012-10-13 DIAGNOSIS — R0609 Other forms of dyspnea: Secondary | ICD-10-CM | POA: Insufficient documentation

## 2012-10-13 DIAGNOSIS — R0600 Dyspnea, unspecified: Secondary | ICD-10-CM

## 2012-10-13 DIAGNOSIS — R0989 Other specified symptoms and signs involving the circulatory and respiratory systems: Secondary | ICD-10-CM | POA: Insufficient documentation

## 2012-10-13 DIAGNOSIS — R0602 Shortness of breath: Secondary | ICD-10-CM

## 2012-10-13 NOTE — Progress Notes (Signed)
Echocardiogram performed.  

## 2012-10-19 ENCOUNTER — Ambulatory Visit (HOSPITAL_COMMUNITY): Payer: Medicare Other | Attending: Cardiology | Admitting: Radiology

## 2012-10-19 VITALS — BP 137/73 | Ht 69.0 in | Wt 206.0 lb

## 2012-10-19 DIAGNOSIS — E119 Type 2 diabetes mellitus without complications: Secondary | ICD-10-CM | POA: Insufficient documentation

## 2012-10-19 DIAGNOSIS — R0602 Shortness of breath: Secondary | ICD-10-CM

## 2012-10-19 DIAGNOSIS — R0609 Other forms of dyspnea: Secondary | ICD-10-CM | POA: Insufficient documentation

## 2012-10-19 DIAGNOSIS — I2510 Atherosclerotic heart disease of native coronary artery without angina pectoris: Secondary | ICD-10-CM | POA: Insufficient documentation

## 2012-10-19 DIAGNOSIS — I517 Cardiomegaly: Secondary | ICD-10-CM | POA: Insufficient documentation

## 2012-10-19 DIAGNOSIS — I739 Peripheral vascular disease, unspecified: Secondary | ICD-10-CM | POA: Insufficient documentation

## 2012-10-19 DIAGNOSIS — R0989 Other specified symptoms and signs involving the circulatory and respiratory systems: Secondary | ICD-10-CM | POA: Insufficient documentation

## 2012-10-19 DIAGNOSIS — I10 Essential (primary) hypertension: Secondary | ICD-10-CM | POA: Insufficient documentation

## 2012-10-19 DIAGNOSIS — Z87891 Personal history of nicotine dependence: Secondary | ICD-10-CM | POA: Insufficient documentation

## 2012-10-19 DIAGNOSIS — R0600 Dyspnea, unspecified: Secondary | ICD-10-CM

## 2012-10-19 DIAGNOSIS — J449 Chronic obstructive pulmonary disease, unspecified: Secondary | ICD-10-CM | POA: Insufficient documentation

## 2012-10-19 DIAGNOSIS — J4489 Other specified chronic obstructive pulmonary disease: Secondary | ICD-10-CM | POA: Insufficient documentation

## 2012-10-19 MED ORDER — REGADENOSON 0.4 MG/5ML IV SOLN
0.4000 mg | Freq: Once | INTRAVENOUS | Status: AC
  Administered 2012-10-19: 0.4 mg via INTRAVENOUS

## 2012-10-19 MED ORDER — TECHNETIUM TC 99M SESTAMIBI GENERIC - CARDIOLITE
11.0000 | Freq: Once | INTRAVENOUS | Status: AC | PRN
  Administered 2012-10-19: 11 via INTRAVENOUS

## 2012-10-19 MED ORDER — TECHNETIUM TC 99M SESTAMIBI GENERIC - CARDIOLITE
33.0000 | Freq: Once | INTRAVENOUS | Status: AC | PRN
  Administered 2012-10-19: 33 via INTRAVENOUS

## 2012-10-19 NOTE — Progress Notes (Signed)
[facility] SITE 3 NUCLEAR MED [address] [place], [place] [address] [phone]    Cardiology Nuclear Med Gregory Farrell is a 79 y.o. male     MRN : [id number]     DOB: 05-12-1933  Procedure Date: 10/19/2012  Nuclear Med Background Indication for Stress Test:  Evaluation for Ischemia History:  COPD and '03 Stent of LAD;'04 Heart Catheterization:EF=65%, Stent patent,Nonobstructive CAD,obtuse margin totally occluded with collaterals;'08 MPS: EF=65%,mild ischemia of inferior wall;10/13/12 Echo: EF=60% and mild to moderated LVH Cardiac Risk Factors: History of Smoking, Hypertension, Lipids, NIDDM and PVD  Symptoms:  DOE and Fatigue   Nuclear Pre-Procedure Caffeine/Decaff Intake:  None NPO After: 9:00pm   Lungs:  clear O2 Sat: 95% on room air. IV 0.9% NS with Angio Cath:  22g  IV Site: R Hand  IV Started by:  Cathlyn Parsons, RN  Chest Size (in):  44 Cup Size: n/a  Height: 5\' 9"  (1.753 m)  Weight:  206 lb (93.441 kg)  BMI:  Body mass index is 30.41 kg/(m^2). Tech Comments:  n/a    Nuclear Med Study 1 or 2 day study: 1 day  Stress Test Type:  Treadmill/Lexiscan  Reading MD: Charlton Haws, MD  Order Authorizing Provider:  Ripley Fraise  Resting Radionuclide: Technetium 42m Sestamibi  Resting Radionuclide Dose: 11.0 mCi   Stress Radionuclide:  Technetium 36m Sestamibi  Stress Radionuclide Dose: 33.0 mCi           Stress Protocol Rest HR: 62 Stress HR: 103  Rest BP: 137/73 Stress BP: 166/69  Exercise Time (min): 2:00 METS: 1.6   Predicted Max HR: 142 bpm % Max HR: 72.54 bpm Rate Pressure Product: 21308   Dose of Adenosine (mg):  n/a Dose of Lexiscan: 0.4 mg  Dose of Atropine (mg): n/a Dose of Dobutamine: n/a mcg/kg/min (at max HR)  Stress Test Technologist: Cathlyn Parsons, RN  Nuclear Technologist:  Domenic Polite, CNMT     Rest Procedure:  Myocardial perfusion imaging was performed at rest 45 minutes following the intravenous administration of  Technetium 64m Sestamibi. Rest ECG: NSR-LBBB and NSR-RBBB  Stress Procedure:  The patient received IV Lexiscan 0.4 mg over 15-seconds with concurrent low level exercise and then Technetium 68m Sestamibi was injected at 30-seconds while the patient continued walking one more minute.  Patient became moderate SOB with infusion. Quantitative spect images were obtained after a 45-minute delay. Stress ECG: No significant change from baseline ECG  QPS Raw Data Images:  Possible lung nodule in left lung field with uptake Stress Images:  Normal homogeneous uptake in all areas of the myocardium. Rest Images:  Normal homogeneous uptake in all areas of the myocardium. Subtraction (SDS):  Normal Transient Ischemic Dilatation (Normal <1.22):  0.90 Lung/Heart Ratio (Normal <0.45):  0.32  Quantitative Gated Spect Images QGS EDV:  49 ml QGS ESV:  16 ml  Impression Exercise Capacity:  Lexiscan with low level exercise. BP Response:  Normal blood pressure response. Clinical Symptoms:  No significant symptoms noted. ECG Impression:  No significant ST segment change suggestive of ischemia. Comparison with Prior Nuclear Study: No images to compare  Overall Impression:  Normal stress nuclear study.  Note there appears to be a "hot" left lung nodule seen best on RAW stress images Recent CXR with lingular scarring Suggest f/u PET or chest CT  LV Ejection Fraction: 69%.  LV Wall Motion:  NL LV Function; NL Wall Motion   Charlton Haws

## 2012-10-20 ENCOUNTER — Other Ambulatory Visit: Payer: Self-pay | Admitting: *Deleted

## 2012-10-20 DIAGNOSIS — R9439 Abnormal result of other cardiovascular function study: Secondary | ICD-10-CM

## 2012-10-21 ENCOUNTER — Ambulatory Visit (INDEPENDENT_AMBULATORY_CARE_PROVIDER_SITE_OTHER)
Admission: RE | Admit: 2012-10-21 | Discharge: 2012-10-21 | Disposition: A | Discharge status: Home / Self Care | Payer: Medicare Other | Source: Ambulatory Visit | Attending: Cardiology | Admitting: Cardiology

## 2012-10-21 DIAGNOSIS — R0989 Other specified symptoms and signs involving the circulatory and respiratory systems: Secondary | ICD-10-CM

## 2012-10-21 DIAGNOSIS — R9439 Abnormal result of other cardiovascular function study: Secondary | ICD-10-CM

## 2012-10-21 DIAGNOSIS — R0609 Other forms of dyspnea: Secondary | ICD-10-CM

## 2012-10-22 ENCOUNTER — Encounter: Payer: Self-pay | Admitting: Family Medicine

## 2012-10-22 ENCOUNTER — Ambulatory Visit (INDEPENDENT_AMBULATORY_CARE_PROVIDER_SITE_OTHER): Payer: Medicare Other | Admitting: Family Medicine

## 2012-10-22 VITALS — BP 112/72 | HR 82 | Temp 97.7°F | Ht 69.0 in | Wt 208.0 lb

## 2012-10-22 DIAGNOSIS — R0609 Other forms of dyspnea: Secondary | ICD-10-CM

## 2012-10-22 DIAGNOSIS — E119 Type 2 diabetes mellitus without complications: Secondary | ICD-10-CM

## 2012-10-22 DIAGNOSIS — J441 Chronic obstructive pulmonary disease with (acute) exacerbation: Secondary | ICD-10-CM

## 2012-10-22 DIAGNOSIS — J4489 Other specified chronic obstructive pulmonary disease: Secondary | ICD-10-CM

## 2012-10-22 DIAGNOSIS — E785 Hyperlipidemia, unspecified: Secondary | ICD-10-CM

## 2012-10-22 DIAGNOSIS — I10 Essential (primary) hypertension: Secondary | ICD-10-CM

## 2012-10-22 DIAGNOSIS — R911 Solitary pulmonary nodule: Secondary | ICD-10-CM

## 2012-10-22 DIAGNOSIS — J449 Chronic obstructive pulmonary disease, unspecified: Secondary | ICD-10-CM

## 2012-10-22 DIAGNOSIS — R0989 Other specified symptoms and signs involving the circulatory and respiratory systems: Secondary | ICD-10-CM

## 2012-10-22 MED ORDER — ALBUTEROL SULFATE HFA 108 (90 BASE) MCG/ACT IN AERS: 2.0000 | INHALATION_SPRAY | Freq: Four times a day (QID) | RESPIRATORY_TRACT | Status: DC | PRN

## 2012-10-22 NOTE — Patient Instructions (Addendum)
   Labs prior to visit, lipid, renal, hgba1c, tsh, hepatic and cbc   Chronic Obstructive Pulmonary Disease Chronic obstructive pulmonary disease (COPD) is a condition in which airflow from the lungs is restricted. The lungs can never return to normal, but there are measures you can take which will improve them and make you feel better. CAUSES   Smoking.  Exposure to secondhand smoke.  Breathing in irritants (pollution, cigarette smoke, strong smells, aerosol sprays, paint fumes).  History of lung infections. TREATMENT  Treatment focuses on making you comfortable (supportive care). Your caregiver may prescribe medications (inhaled or pills) to help improve your breathing. HOME CARE INSTRUCTIONS   If you smoke, stop smoking.  Avoid exposure to smoke, chemicals, and fumes that aggravate your breathing.  Take antibiotic medicines as directed by your caregiver.  Avoid medicines that dry up your system and slow down the elimination of secretions (antihistamines and cough syrups). This decreases respiratory capacity and may lead to infections.  Drink enough water and fluids to keep your urine clear or pale yellow. This loosens secretions.  Use humidifiers at home and at your bedside if they do not make breathing difficult.  Receive all protective vaccines your caregiver suggests, especially pneumococcal and influenza.  Use home oxygen as suggested.  Stay active. Exercise and physical activity will help maintain your ability to do things you want to do.  Eat a healthy diet. SEEK MEDICAL CARE IF:   You develop pus-like mucus (sputum).  Breathing is more labored or exercise becomes difficult to do.  You are running out of the medicine you take for your breathing. SEEK IMMEDIATE MEDICAL CARE IF:   You have a rapid heart rate.  You have agitation, confusion, tremors, or are in a stupor (family members may need to observe this).  It becomes difficult to breathe.  You develop  chest pain.  You have a fever. MAKE SURE YOU:   Understand these instructions.  Will watch your condition.  Will get help right away if you are not doing well or get worse. Document Released: 04/10/2005 Document Revised: 09/23/2011 Document Reviewed: 08/31/2010 [facility] Patient Information 2013 [place], [place].

## 2012-10-25 ENCOUNTER — Encounter: Payer: Self-pay | Admitting: Family Medicine

## 2012-10-25 NOTE — Progress Notes (Signed)
Patient ID: Gregory Farrell, male   DOB: July 24, 1932, 79 y.o.   MRN: [id number] [facility] [id number] 10-09-1932 10/25/2012      Progress Note-Follow Up  Subjective  Chief Complaint  Chief Complaint  Patient presents with  . Follow-up    4 week    HPI  Patient is a 79 year old male who is in today for followup. He is generally doing well. He reports his blisters and relatively well-controlled recently. Denies polyuria or polydipsia. No chest pain or palpitations. No recent illness fevers shortness of breath GI or GU concerns noted today. Taking his medications as prescribed  Past Medical History  Diagnosis Date  . Hypertension   . COPD (chronic obstructive pulmonary disease)   . GERD (gastroesophageal reflux disease)   . Renal insufficiency   . Asthma   . Hyperlipidemia   . CAD (coronary artery disease)   . B12 deficiency   . Memory loss   . Vertigo   . Diabetes mellitus     type II, poly neuropathy    Past Surgical History  Procedure Laterality Date  . Coronary angioplasty with stent placement    . Inguinal hernia repair      Family History  Problem Relation Age of Onset  . Coronary artery disease    . Breast cancer    . Diabetes      History   Social History  . Marital Status: Married    Spouse Name: N/A    Number of Children: 1  . Years of Education: N/A   Occupational History  . Retired    Social History Main Topics  . Smoking status: Former Smoker -- 1.00 packs/day for 10 years  . Smokeless tobacco: Never Used  . Alcohol Use: No  . Drug Use: Not on file  . Sexually Active: Not on file   Other Topics Concern  . Not on file   Social History Narrative  . No narrative on file    Current Outpatient Prescriptions on File Prior to Visit  Medication Sig Dispense Refill  . amLODipine (NORVASC) 10 MG tablet Take 0.5 tablets (5 mg total) by mouth daily.  45 tablet  1  . aspirin 81 MG tablet Take 81 mg by mouth daily.        Marland Kitchen atorvastatin (LIPITOR) 80 MG  tablet Take 80 mg by mouth daily.      . citalopram (CELEXA) 20 MG tablet TAKE ONE TABLET BY MOUTH ONE TIME DAILY  90 tablet  2  . glimepiride (AMARYL) 2 MG tablet Take 1 tablet (2 mg total) by mouth daily before breakfast.  30 tablet  6  . lisinopril (PRINIVIL,ZESTRIL) 10 MG tablet Take 0.5 tablets (5 mg total) by mouth daily.  45 tablet  1  . omeprazole (PRILOSEC) 20 MG capsule TAKE ONE CAPSULE BY MOUTH ONE TIME DAILY  90 capsule  1  . sitaGLIPtin (JANUVIA) 50 MG tablet Take 1 tablet (50 mg total) by mouth daily.  28 tablet  0   No current facility-administered medications on file prior to visit.    Allergies  Allergen Reactions  . Exenatide     REACTION: nausea    Review of Systems  Review of Systems  Constitutional: Negative for fever and malaise/fatigue.  HENT: Negative for congestion.   Eyes: Negative for discharge.  Respiratory: Negative for shortness of breath.   Cardiovascular: Negative for chest pain, palpitations and leg swelling.  Gastrointestinal: Negative for nausea, abdominal pain and diarrhea.  Genitourinary: Negative  for dysuria.  Musculoskeletal: Negative for falls.  Skin: Negative for rash.  Neurological: Negative for loss of consciousness and headaches.  Endo/Heme/Allergies: Negative for polydipsia.  Psychiatric/Behavioral: Negative for depression and suicidal ideas. The patient is not nervous/anxious and does not have insomnia.     Objective  BP 112/72  Pulse 82  Temp(Src) 97.7 F (36.5 C) (Oral)  Ht 5\' 9"  (1.753 m)  Wt 208 lb (94.348 kg)  BMI 30.7 kg/m2  SpO2 94%  Physical Exam  Physical Exam  Constitutional: He is oriented to person, place, and time and well-developed, well-nourished, and in no distress. No distress.  HENT:  Head: Normocephalic and atraumatic.  Eyes: Conjunctivae are normal.  Neck: Neck supple. No thyromegaly present.  Cardiovascular: Normal rate, regular rhythm and normal heart sounds.   No murmur heard. Pulmonary/Chest:  Effort normal and breath sounds normal. No respiratory distress.  Abdominal: He exhibits no distension and no mass. There is no tenderness.  Musculoskeletal: He exhibits no edema.  Neurological: He is alert and oriented to person, place, and time.  Skin: Skin is warm.  Psychiatric: Memory, affect and judgment normal.    Lab Results  Component Value Date   TSH 1.024 03/14/2009   Lab Results  Component Value Date   WBC 6.4 08/18/2012   HGB 13.8 08/18/2012   HCT 40.6 08/18/2012   MCV 89.6 08/18/2012   PLT 175 08/18/2012   Lab Results  Component Value Date   CREATININE 1.60* 08/18/2012   BUN 27* 08/18/2012   NA 139 08/18/2012   K 4.9 08/18/2012   CL 106 08/18/2012   CO2 27 08/18/2012   Lab Results  Component Value Date   ALT 17 05/19/2012   AST 19 05/19/2012   ALKPHOS 91 05/19/2012   BILITOT 0.7 05/19/2012   Lab Results  Component Value Date   CHOL 142 05/19/2012   Lab Results  Component Value Date   HDL 41 05/19/2012   Lab Results  Component Value Date   LDLCALC 70 05/19/2012   Lab Results  Component Value Date   TRIG 156* 05/19/2012   Lab Results  Component Value Date   CHOLHDL 3.5 05/19/2012     Assessment & Plan  HYPERTENSION Well controlled, no changes  DIABETES MELLITUS, TYPE II hgba1c at 8.3, minimize simple carbs, increase exercise continue current meds  HYPERLIPIDEMIA Tolerating Lipitor, TG up very slightly, minimize simple carbs, trans and saturated fats  COPD Doing well has Albuterol prn

## 2012-10-25 NOTE — Assessment & Plan Note (Signed)
Doing well has Albuterol prn

## 2012-10-25 NOTE — Assessment & Plan Note (Signed)
Well controlled, no changes 

## 2012-10-25 NOTE — Assessment & Plan Note (Signed)
Tolerating Lipitor, TG up very slightly, minimize simple carbs, trans and saturated fats

## 2012-10-25 NOTE — Assessment & Plan Note (Addendum)
hgba1c at 8.3, minimize simple carbs, increase exercise continue current meds

## 2012-10-26 ENCOUNTER — Other Ambulatory Visit: Payer: Self-pay | Admitting: Internal Medicine

## 2012-10-27 NOTE — Telephone Encounter (Signed)
Rx request to pharmacy/SLS  

## 2012-11-02 ENCOUNTER — Other Ambulatory Visit: Payer: Self-pay | Admitting: Internal Medicine

## 2012-11-10 ENCOUNTER — Telehealth: Payer: Self-pay | Admitting: *Deleted

## 2012-11-10 DIAGNOSIS — E119 Type 2 diabetes mellitus without complications: Secondary | ICD-10-CM

## 2012-11-10 DIAGNOSIS — E785 Hyperlipidemia, unspecified: Secondary | ICD-10-CM

## 2012-11-10 DIAGNOSIS — I10 Essential (primary) hypertension: Secondary | ICD-10-CM

## 2012-11-10 LAB — CBC WITH DIFFERENTIAL/PLATELET
Basophils Relative: 1 % (ref 0–1)
Eosinophils Absolute: 0.4 10*3/uL (ref 0.0–0.7)
Eosinophils Relative: 5 % (ref 0–5)
Hemoglobin: 14.4 g/dL (ref 13.0–17.0)
Lymphs Abs: 1.8 10*3/uL (ref 0.7–4.0)
MCH: 29.5 pg (ref 26.0–34.0)
MCHC: 33.3 g/dL (ref 30.0–36.0)
MCV: 88.7 fL (ref 78.0–100.0)
Monocytes Absolute: 0.7 10*3/uL (ref 0.1–1.0)
Monocytes Relative: 9 % (ref 3–12)
Neutrophils Relative %: 61 % (ref 43–77)
RBC: 4.88 MIL/uL (ref 4.22–5.81)

## 2012-11-10 LAB — HEMOGLOBIN A1C: Hgb A1c MFr Bld: 8 % — ABNORMAL HIGH (ref <5.7)

## 2012-11-10 NOTE — Telephone Encounter (Signed)
Pt presented to the lab. Orders entered per 10/22/12 AVS as below:  Labs prior to visit, lipid, renal, hgba1c, tsh, hepatic and cbc

## 2012-11-11 ENCOUNTER — Encounter: Payer: Self-pay | Admitting: *Deleted

## 2012-11-11 LAB — HEPATIC FUNCTION PANEL
Alkaline Phosphatase: 109 U/L (ref 39–117)
Bilirubin, Direct: 0.1 mg/dL (ref 0.0–0.3)
Indirect Bilirubin: 0.5 mg/dL (ref 0.0–0.9)
Total Protein: 6.4 g/dL (ref 6.0–8.3)

## 2012-11-11 LAB — RENAL FUNCTION PANEL
Albumin: 3.6 g/dL (ref 3.5–5.2)
BUN: 19 mg/dL (ref 6–23)
CO2: 27 mEq/L (ref 19–32)
Chloride: 103 mEq/L (ref 96–112)
Creat: 1.75 mg/dL — ABNORMAL HIGH (ref 0.50–1.35)
Glucose, Bld: 140 mg/dL — ABNORMAL HIGH (ref 70–99)

## 2012-11-11 LAB — LIPID PANEL
Cholesterol: 162 mg/dL (ref 0–200)
LDL Cholesterol: 83 mg/dL (ref 0–99)
Triglycerides: 177 mg/dL — ABNORMAL HIGH (ref <150)
VLDL: 35 mg/dL (ref 0–40)

## 2012-11-12 ENCOUNTER — Ambulatory Visit (INDEPENDENT_AMBULATORY_CARE_PROVIDER_SITE_OTHER): Payer: Medicare Other | Admitting: Critical Care Medicine

## 2012-11-12 ENCOUNTER — Encounter: Payer: Self-pay | Admitting: Critical Care Medicine

## 2012-11-12 VITALS — BP 126/74 | HR 98 | Ht 69.0 in | Wt 209.0 lb

## 2012-11-12 DIAGNOSIS — J449 Chronic obstructive pulmonary disease, unspecified: Secondary | ICD-10-CM

## 2012-11-12 DIAGNOSIS — R0602 Shortness of breath: Secondary | ICD-10-CM

## 2012-11-12 MED ORDER — TIOTROPIUM BROMIDE MONOHYDRATE 18 MCG IN CAPS: 18.0000 ug | ORAL_CAPSULE | Freq: Every day | RESPIRATORY_TRACT | Status: DC

## 2012-11-12 NOTE — Progress Notes (Signed)
Subjective:    Patient ID: Gregory Farrell, male    DOB: 02/20/1933, 79 y.o.   MRN: [id number]  HPI Comments: Notes wheezing and dyspnea x 1 year or more, progressing   Shortness of Breath This is a chronic problem. The current episode started more than 1 year ago. Episode frequency: exertional only, fast walking, to mailbox, grocery store. The problem has been gradually worsening. Associated symptoms include wheezing. Pertinent negatives include no abdominal pain, chest pain, claudication, coryza, ear pain, fever, headaches, hemoptysis, leg pain, leg swelling, neck pain, orthopnea, PND, rash, rhinorrhea, sore throat, sputum production, swollen glands, syncope or vomiting. The symptoms are aggravated by any activity. Risk factors include smoking. He has tried beta agonist inhalers for the symptoms. The treatment provided no relief. His past medical history is significant for CAD. There is no history of allergies, aspirin allergies, asthma, bronchiolitis, chronic lung disease, COPD, DVT, a heart failure, PE, pneumonia or a recent surgery.    Past Medical History  Diagnosis Date  . Hypertension   . COPD (chronic obstructive pulmonary disease)   . GERD (gastroesophageal reflux disease)   . Renal insufficiency   . Asthma   . Hyperlipidemia   . CAD (coronary artery disease)   . B12 deficiency   . Memory loss   . Vertigo   . Diabetes mellitus     type II, poly neuropathy     Family History  Problem Relation Age of Onset  . Coronary artery disease Mother   . Breast cancer Mother   . Diabetes Mother      History   Social History  . Marital Status: Married    Spouse Name: N/A    Number of Children: 1  . Years of Education: N/A   Occupational History  . Retired     [employer]   Social History Main Topics  . Smoking status: Former Smoker -- 1.00 packs/day for 15 years    Types: Cigarettes    Quit date: 07/15/2002  . Smokeless tobacco: Never Used  . Alcohol Use: No  . Drug Use: Not  on file  . Sexually Active: Not on file   Other Topics Concern  . Not on file   Social History Narrative  . No narrative on file     Allergies  Allergen Reactions  . Exenatide     REACTION: nausea     Outpatient Prescriptions Prior to Visit  Medication Sig Dispense Refill  . albuterol (PROVENTIL HFA;VENTOLIN HFA) 108 (90 BASE) MCG/ACT inhaler Inhale 2 puffs into the lungs every 6 (six) hours as needed for wheezing.  1 Inhaler  1  . amLODipine (NORVASC) 10 MG tablet TAKE HALF TABLET BY MOUTH DAILY  45 tablet  0  . aspirin 81 MG tablet Take 81 mg by mouth daily.        Marland Kitchen atorvastatin (LIPITOR) 80 MG tablet Take 80 mg by mouth daily.      . citalopram (CELEXA) 20 MG tablet TAKE ONE TABLET BY MOUTH ONE TIME DAILY  90 tablet  2  . glimepiride (AMARYL) 2 MG tablet Take 1 tablet (2 mg total) by mouth daily before breakfast.  30 tablet  6  . lisinopril (PRINIVIL,ZESTRIL) 10 MG tablet TAKE HALF TABLET BY MOUTH DAILY  45 tablet  1  . omeprazole (PRILOSEC) 20 MG capsule TAKE ONE CAPSULE BY MOUTH ONE TIME DAILY  90 capsule  1  . sitaGLIPtin (JANUVIA) 50 MG tablet Take 1 tablet (50 mg total) by mouth  daily.  28 tablet  0   No facility-administered medications prior to visit.      Review of Systems  Constitutional: Positive for appetite change, fatigue and unexpected weight change. Negative for fever, chills, diaphoresis and activity change.  HENT: Positive for neck stiffness. Negative for hearing loss, ear pain, nosebleeds, congestion, sore throat, facial swelling, rhinorrhea, sneezing, mouth sores, trouble swallowing, neck pain, dental problem, voice change, postnasal drip, sinus pressure, tinnitus and ear discharge.   Eyes: Negative for photophobia, discharge, itching and visual disturbance.  Respiratory: Positive for shortness of breath and wheezing. Negative for apnea, cough, hemoptysis, sputum production, choking, chest tightness and stridor.   Cardiovascular: Negative for chest pain,  palpitations, orthopnea, claudication, leg swelling, syncope and PND.  Gastrointestinal: Positive for constipation. Negative for nausea, vomiting, abdominal pain, blood in stool and abdominal distention.  Genitourinary: Negative for dysuria, urgency, frequency, hematuria, flank pain, decreased urine volume and difficulty urinating.  Musculoskeletal: Positive for myalgias, arthralgias and gait problem. Negative for back pain and joint swelling.  Skin: Negative for color change, pallor and rash.  Neurological: Negative for dizziness, tremors, seizures, syncope, speech difficulty, weakness, light-headedness, numbness and headaches.  Hematological: Negative for adenopathy. Bruises/bleeds easily.  Psychiatric/Behavioral: Positive for sleep disturbance. Negative for confusion and agitation. The patient is not nervous/anxious.        Objective:   Physical Exam Filed Vitals:   11/12/12 1138  BP: 126/74  Pulse: 98  Height: 5\' 9"  (1.753 m)  Weight: 209 lb (94.802 kg)  SpO2: 93%    Gen: Pleasant, well-nourished, in no distress,  normal affect  ENT: No lesions,  mouth clear,  oropharynx clear, no postnasal drip  Neck: No JVD, no TMG, no carotid bruits  Lungs: No use of accessory muscles, no dullness to percussion, distant breath sounds  Cardiovascular: RRR, heart sounds normal, no murmur or gallops, no peripheral edema  Abdomen: soft and NT, no HSM,  BS normal  Musculoskeletal: No deformities, no cyanosis or clubbing  Neuro: alert, non focal  Skin: Warm, no lesions or rashes  Spirometry 11/12/2012: FEV1 45% predicted FVC 60% predicted FEV1 FEC ratio 45% predicted  CT Chest 10/20/12 IMPRESSION:  1. Centrilobular emphysema with bibasilar scarring. No evidence  of left-sided pulmonary lesion to correspond to the reported  abnormal tracer uptake.  2. Superior segment right lower lobe pulmonary nodule. Given risk  factors for bronchogenic carcinoma, follow-up chest CT at 1 year is   recommended. This recommendation follows the consensus statement:  "Guidelines for Management of Small Pulmonary Nodules Detected on  CT Scans: A Statement from the Fleischner Society" as published in  Radiology 2005; 237:395-400. Available online at:  DietDisorder.cz.  3. Advanced atherosclerosis, including within the coronary  arteries.  4. Moderate hiatal hernia.  5. Left adrenal adenoma.        Assessment & Plan:   COPD gold stage C. Gold stage C. COPD with primary emphysematous component Plan Begin Spiriva one daily Continue albuterol as needed   Updated Medication List Outpatient Encounter Prescriptions as of 11/12/2012  Medication Sig Dispense Refill  . albuterol (PROVENTIL HFA;VENTOLIN HFA) 108 (90 BASE) MCG/ACT inhaler Inhale 2 puffs into the lungs every 6 (six) hours as needed for wheezing.  1 Inhaler  1  . amLODipine (NORVASC) 10 MG tablet TAKE HALF TABLET BY MOUTH DAILY  45 tablet  0  . aspirin 81 MG tablet Take 81 mg by mouth daily.        Marland Kitchen atorvastatin (LIPITOR) 80 MG tablet  Take 80 mg by mouth daily.      . citalopram (CELEXA) 20 MG tablet TAKE ONE TABLET BY MOUTH ONE TIME DAILY  90 tablet  2  . glimepiride (AMARYL) 2 MG tablet Take 1 tablet (2 mg total) by mouth daily before breakfast.  30 tablet  6  . lisinopril (PRINIVIL,ZESTRIL) 10 MG tablet TAKE HALF TABLET BY MOUTH DAILY  45 tablet  1  . omeprazole (PRILOSEC) 20 MG capsule TAKE ONE CAPSULE BY MOUTH ONE TIME DAILY  90 capsule  1  . sitaGLIPtin (JANUVIA) 50 MG tablet Take 1 tablet (50 mg total) by mouth daily.  28 tablet  0  . tiotropium (SPIRIVA HANDIHALER) 18 MCG inhalation capsule Place 1 capsule (18 mcg total) into inhaler and inhale daily.  30 capsule  11  . tiotropium (SPIRIVA HANDIHALER) 18 MCG inhalation capsule Place 1 capsule (18 mcg total) into inhaler and inhale daily.  20 capsule  0   No facility-administered encounter medications on file as of 11/12/2012.

## 2012-11-12 NOTE — Patient Instructions (Addendum)
Start Spiriva one capsule , two puff daily No other medication changes Return 3 months

## 2012-11-12 NOTE — Assessment & Plan Note (Signed)
Gold stage C. COPD with primary emphysematous component Plan Begin Spiriva one daily Continue albuterol as needed

## 2012-11-18 ENCOUNTER — Ambulatory Visit (INDEPENDENT_AMBULATORY_CARE_PROVIDER_SITE_OTHER): Payer: Medicare Other | Admitting: Cardiology

## 2012-11-18 ENCOUNTER — Encounter: Payer: Self-pay | Admitting: Cardiology

## 2012-11-18 VITALS — BP 122/80 | HR 73 | Ht 69.0 in | Wt 210.0 lb

## 2012-11-18 DIAGNOSIS — R918 Other nonspecific abnormal finding of lung field: Secondary | ICD-10-CM | POA: Insufficient documentation

## 2012-11-18 DIAGNOSIS — E785 Hyperlipidemia, unspecified: Secondary | ICD-10-CM

## 2012-11-18 DIAGNOSIS — I2510 Atherosclerotic heart disease of native coronary artery without angina pectoris: Secondary | ICD-10-CM

## 2012-11-18 DIAGNOSIS — R0602 Shortness of breath: Secondary | ICD-10-CM

## 2012-11-18 DIAGNOSIS — I10 Essential (primary) hypertension: Secondary | ICD-10-CM

## 2012-11-18 NOTE — Progress Notes (Signed)
HPI: Pleasant male for fu of CAD. His last catheterization was performed in January 2004, his ejection fraction was 65%. There was a 30-50% left main. The Cypher stent in the LAD was widely patent. There was an eccentric area of stenosis of 50-70% in the LAD that was unchanged from previous. The distal obtuse marginal at its origin from a bifurcation with his second obtuse marginal was totally occluded, both filled via left-to-left collaterals and there was also right-to-left collaterals. The previous OM at the site of PCI was total. The right coronary artery had a 50% lesion. Renal Dopplers in May of 2011 showed no renal artery stenosis. Abdominal ultrasound in August of 2013 showed no aneurysm but technically difficult. ABIs in November of 2013 showed isolated right anterior tibial artery disease. Echocardiogram in April of 2014 showed normal LV function, mild to moderate left ventricular hypertrophy, grade 1 diastolic dysfunction, mild right ventricular enlargement with mildly reduced RV function. Myoview in April of 2014 showed an ejection fraction of 69%. There was question of a left lung nodule. CT scan recommended. followup chest CT showed emphysema with no left-sided pulmonary lesions noted. There was a right lower lobe pulmonary nodule and followup recommended in one year. Patient has since been seen by pulmonary.  Patient does have dyspnea on exertion but there is no orthopnea, PND, pedal edema, chest pain or syncope.   Current Outpatient Prescriptions  Medication Sig Dispense Refill  . albuterol (PROVENTIL HFA;VENTOLIN HFA) 108 (90 BASE) MCG/ACT inhaler Inhale 2 puffs into the lungs every 6 (six) hours as needed for wheezing.  1 Inhaler  1  . amLODipine (NORVASC) 10 MG tablet TAKE HALF TABLET BY MOUTH DAILY  45 tablet  0  . aspirin 81 MG tablet Take 81 mg by mouth daily.        Marland Kitchen atorvastatin (LIPITOR) 80 MG tablet Take 80 mg by mouth daily.      . citalopram (CELEXA) 20 MG tablet TAKE ONE  TABLET BY MOUTH ONE TIME DAILY  90 tablet  2  . glimepiride (AMARYL) 2 MG tablet Take 1 tablet (2 mg total) by mouth daily before breakfast.  30 tablet  6  . lisinopril (PRINIVIL,ZESTRIL) 10 MG tablet TAKE HALF TABLET BY MOUTH DAILY  45 tablet  1  . omeprazole (PRILOSEC) 20 MG capsule TAKE ONE CAPSULE BY MOUTH ONE TIME DAILY  90 capsule  1  . sitaGLIPtin (JANUVIA) 50 MG tablet Take 1 tablet (50 mg total) by mouth daily.  28 tablet  0  . tiotropium (SPIRIVA HANDIHALER) 18 MCG inhalation capsule Place 1 capsule (18 mcg total) into inhaler and inhale daily.  30 capsule  11   No current facility-administered medications for this visit.     Past Medical History  Diagnosis Date  . Hypertension   . COPD (chronic obstructive pulmonary disease)   . GERD (gastroesophageal reflux disease)   . Renal insufficiency   . Asthma   . Hyperlipidemia   . CAD (coronary artery disease)   . B12 deficiency   . Memory loss   . Vertigo   . Diabetes mellitus     type II, poly neuropathy    Past Surgical History  Procedure Laterality Date  . Coronary angioplasty with stent placement    . Inguinal hernia repair      History   Social History  . Marital Status: Married    Spouse Name: N/A    Number of Children: 1  . Years of Education: N/A  Occupational History  . Retired     [employer]   Social History Main Topics  . Smoking status: Former Smoker -- 1.00 packs/day for 15 years    Types: Cigarettes    Quit date: 07/15/2002  . Smokeless tobacco: Never Used  . Alcohol Use: No  . Drug Use: Not on file  . Sexually Active: Not on file   Other Topics Concern  . Not on file   Social History Narrative  . No narrative on file    ROS: no fevers or chills, productive cough, hemoptysis, dysphasia, odynophagia, melena, hematochezia, dysuria, hematuria, rash, seizure activity, orthopnea, PND, pedal edema, claudication. Remaining systems are negative.  Physical Exam: Well-developed well-nourished  in no acute distress.  Skin is warm and dry.  HEENT is normal.  Neck is supple.  Chest is clear to auscultation with normal expansion.  Cardiovascular exam is regular rate and rhythm.  Abdominal exam nontender or distended. No masses palpated. Extremities show no edema. neuro grossly intact

## 2012-11-18 NOTE — Assessment & Plan Note (Signed)
Continue aspirin and statin.Myoview showed normal perfusion.

## 2012-11-18 NOTE — Assessment & Plan Note (Signed)
Cardiac workup negative. He does have COPD. Spiriva initiated by Dr. Delford Field. More likely pulmonary etiology.

## 2012-11-18 NOTE — Assessment & Plan Note (Signed)
Blood pressure controlled. Continue present medications. 

## 2012-11-18 NOTE — Assessment & Plan Note (Signed)
Continue statin. 

## 2012-11-18 NOTE — Assessment & Plan Note (Signed)
Plan followup chest CT April 2015.

## 2012-11-18 NOTE — Patient Instructions (Addendum)
Your physician wants you to follow-up in: ONE YEAR WITH DR CRENSHAW You will receive a reminder letter in the mail two months in advance. If you don't receive a letter, please call our office to schedule the follow-up appointment.  

## 2012-11-19 ENCOUNTER — Ambulatory Visit (INDEPENDENT_AMBULATORY_CARE_PROVIDER_SITE_OTHER): Payer: Medicare Other | Admitting: Family Medicine

## 2012-11-19 ENCOUNTER — Encounter: Payer: Self-pay | Admitting: Family Medicine

## 2012-11-19 VITALS — BP 126/82 | HR 86 | Temp 98.0°F | Ht 69.0 in | Wt 210.0 lb

## 2012-11-19 DIAGNOSIS — I10 Essential (primary) hypertension: Secondary | ICD-10-CM

## 2012-11-19 DIAGNOSIS — J449 Chronic obstructive pulmonary disease, unspecified: Secondary | ICD-10-CM

## 2012-11-19 DIAGNOSIS — F329 Major depressive disorder, single episode, unspecified: Secondary | ICD-10-CM

## 2012-11-19 DIAGNOSIS — R799 Abnormal finding of blood chemistry, unspecified: Secondary | ICD-10-CM

## 2012-11-19 DIAGNOSIS — N259 Disorder resulting from impaired renal tubular function, unspecified: Secondary | ICD-10-CM

## 2012-11-19 DIAGNOSIS — R7989 Other specified abnormal findings of blood chemistry: Secondary | ICD-10-CM

## 2012-11-19 DIAGNOSIS — E119 Type 2 diabetes mellitus without complications: Secondary | ICD-10-CM

## 2012-11-19 DIAGNOSIS — R350 Frequency of micturition: Secondary | ICD-10-CM

## 2012-11-19 LAB — RENAL FUNCTION PANEL
Albumin: 3.5 g/dL (ref 3.5–5.2)
CO2: 28 mEq/L (ref 19–32)
Chloride: 103 mEq/L (ref 96–112)
Creat: 1.75 mg/dL — ABNORMAL HIGH (ref 0.50–1.35)
Phosphorus: 3.3 mg/dL (ref 2.3–4.6)
Sodium: 138 mEq/L (ref 135–145)

## 2012-11-19 MED ORDER — CITALOPRAM HYDROBROMIDE 20 MG PO TABS: 40.0000 mg | ORAL_TABLET | Freq: Every day | ORAL | Status: DC

## 2012-11-19 NOTE — Patient Instructions (Addendum)
DASH Diet The DASH diet stands for "Dietary Approaches to Stop Hypertension." It is a healthy eating plan that has been shown to reduce high blood pressure (hypertension) in as little as 14 days, while also possibly providing other significant health benefits. These other health benefits include reducing the risk of breast cancer after menopause and reducing the risk of type 2 diabetes, heart disease, colon cancer, and stroke. Health benefits also include weight loss and slowing kidney failure in patients with chronic kidney disease.  DIET GUIDELINES  Limit salt (sodium). Your diet should contain less than 1500 mg of sodium daily.  Limit refined or processed carbohydrates. Your diet should include mostly whole grains. Desserts and added sugars should be used sparingly.  Include small amounts of heart-healthy fats. These types of fats include nuts, oils, and tub margarine. Limit saturated and trans fats. These fats have been shown to be harmful in the body. CHOOSING FOODS  The following food groups are based on a 2000 calorie diet. See your Registered Dietitian for individual calorie needs. Grains and Grain Products (6 to 8 servings daily)  Eat More Often: Whole-wheat bread, brown rice, whole-grain or wheat pasta, quinoa, popcorn without added fat or salt (air popped).  Eat Less Often: White bread, white pasta, white rice, cornbread. Vegetables (4 to 5 servings daily)  Eat More Often: Fresh, frozen, and canned vegetables. Vegetables may be raw, steamed, roasted, or grilled with a minimal amount of fat.  Eat Less Often/Avoid: Creamed or fried vegetables. Vegetables in a cheese sauce. Fruit (4 to 5 servings daily)  Eat More Often: All fresh, canned (in natural juice), or frozen fruits. Dried fruits without added sugar. One hundred percent fruit juice ( cup [237 mL] daily).  Eat Less Often: Dried fruits with added sugar. Canned fruit in light or heavy syrup. [employer], Fish, and Poultry (2  servings or less daily. One serving is 3 to 4 oz [85-114 g]).  Eat More Often: Ninety percent or leaner ground beef, tenderloin, sirloin. Round cuts of beef, chicken breast, [place] breast. All fish. Grill, bake, or broil your meat. Nothing should be fried.  Eat Less Often/Avoid: Fatty cuts of meat, [place], or chicken leg, thigh, or wing. Fried cuts of meat or fish. Dairy (2 to 3 servings)  Eat More Often: Low-fat or fat-free milk, low-fat plain or light yogurt, reduced-fat or part-skim cheese.  Eat Less Often/Avoid: Milk (whole, 2%).Whole milk yogurt. Full-fat cheeses. Nuts, Seeds, and Legumes (4 to 5 servings per week)  Eat More Often: All without added salt.  Eat Less Often/Avoid: Salted nuts and seeds, canned beans with added salt. Fats and Sweets (limited)  Eat More Often: Vegetable oils, tub margarines without trans fats, sugar-free gelatin. Mayonnaise and salad dressings.  Eat Less Often/Avoid: Coconut oils, palm oils, butter, stick margarine, cream, half and half, cookies, candy, pie. FOR MORE INFORMATION The Dash Diet Eating Plan: www.dashdiet.org Document Released: 06/20/2011 Document Revised: 03/11013 Document Reviewed: 06/20/2011 [facility] Patient Information 2013 [employer]2

## 2012-11-20 LAB — URINALYSIS
Bilirubin Urine: NEGATIVE
Ketones, ur: NEGATIVE mg/dL
Specific Gravity, Urine: 1.017 (ref 1.005–1.030)
Urobilinogen, UA: 0.2 mg/dL (ref 0.0–1.0)
pH: 5.5 (ref 5.0–8.0)

## 2012-11-20 NOTE — Progress Notes (Signed)
Quick Note:  Patient Informed and voiced understanding  Renal panel ordered ______ 

## 2012-11-21 LAB — URINE CULTURE
Colony Count: NO GROWTH
Organism ID, Bacteria: NO GROWTH

## 2012-11-22 ENCOUNTER — Encounter: Payer: Self-pay | Admitting: Family Medicine

## 2012-11-22 NOTE — Assessment & Plan Note (Signed)
Continue current meds for now avoid simple carbs and recheck in 2 months time

## 2012-11-22 NOTE — Assessment & Plan Note (Signed)
Well controlled, no changes 

## 2012-11-22 NOTE — Assessment & Plan Note (Signed)
Has had Spiriva added by Pulmonology recently, he thinks this is helpful

## 2012-11-22 NOTE — Progress Notes (Signed)
Patient ID: Gregory Farrell, male   DOB: [birth year], 79 y.o.   MRN: [id number] Gregory Farrell [id number] [birth year] 11/22/2012      Progress Note-Follow Up  Subjective  Chief Complaint  Chief Complaint  Patient presents with  . Follow-up    HPI  Patient is a 79 year old male in today for follow up. Has been seen by pulmonology and has had Spiriva added with some improvement in dyspnea noted by patient. No recent illness. No recent fevers, malaise, cp, palp, sob, gi or gu c/o.   Past Medical History  Diagnosis Date  . Hypertension   . COPD (chronic obstructive pulmonary disease)   . GERD (gastroesophageal reflux disease)   . Renal insufficiency   . Asthma   . Hyperlipidemia   . CAD (coronary artery disease)   . B12 deficiency   . Memory loss   . Vertigo   . Diabetes mellitus     type II, poly neuropathy    Past Surgical History  Procedure Laterality Date  . Coronary angioplasty with stent placement    . Inguinal hernia repair      Family History  Problem Relation Age of Onset  . Coronary artery disease Mother   . Breast cancer Mother   . Diabetes Mother     History   Social History  . Marital Status: Married    Spouse Name: N/A    Number of Children: 1  . Years of Education: N/A   Occupational History  . Retired     [employer]   Social History Main Topics  . Smoking status: Former Smoker -- 1.00 packs/day for 15 years    Types: Cigarettes    Quit date: 07/15/2002  . Smokeless tobacco: Never Used  . Alcohol Use: No  . Drug Use: Not on file  . Sexually Active: Not on file   Other Topics Concern  . Not on file   Social History Narrative  . No narrative on file    Current Outpatient Prescriptions on File Prior to Visit  Medication Sig Dispense Refill  . albuterol (PROVENTIL HFA;VENTOLIN HFA) 108 (90 BASE) MCG/ACT inhaler Inhale 2 puffs into the lungs every 6 (six) hours as needed for wheezing.  1 Inhaler  1  . amLODipine (NORVASC) 10 MG tablet TAKE HALF  TABLET BY MOUTH DAILY  45 tablet  0  . aspirin 81 MG tablet Take 81 mg by mouth daily.        Marland Kitchen atorvastatin (LIPITOR) 80 MG tablet Take 80 mg by mouth daily.      Marland Kitchen glimepiride (AMARYL) 2 MG tablet Take 1 tablet (2 mg total) by mouth daily before breakfast.  30 tablet  6  . lisinopril (PRINIVIL,ZESTRIL) 10 MG tablet TAKE HALF TABLET BY MOUTH DAILY  45 tablet  1  . omeprazole (PRILOSEC) 20 MG capsule TAKE ONE CAPSULE BY MOUTH ONE TIME DAILY  90 capsule  1  . sitaGLIPtin (JANUVIA) 50 MG tablet Take 1 tablet (50 mg total) by mouth daily.  28 tablet  0  . tiotropium (SPIRIVA HANDIHALER) 18 MCG inhalation capsule Place 1 capsule (18 mcg total) into inhaler and inhale daily.  30 capsule  11   No current facility-administered medications on file prior to visit.    Allergies  Allergen Reactions  . Exenatide     REACTION: nausea    Review of Systems  Review of Systems  Constitutional: Negative for fever and malaise/fatigue.  HENT: Negative for congestion.  Eyes: Negative for discharge.  Respiratory: Negative for shortness of breath.   Cardiovascular: Negative for chest pain, palpitations and leg swelling.  Gastrointestinal: Negative for nausea, abdominal pain and diarrhea.  Genitourinary: Negative for dysuria.  Musculoskeletal: Negative for falls.  Skin: Negative for rash.  Neurological: Negative for loss of consciousness and headaches.  Endo/Heme/Allergies: Negative for polydipsia.  Psychiatric/Behavioral: Negative for depression and suicidal ideas. The patient is not nervous/anxious and does not have insomnia.     Objective  BP 126/82  Pulse 86  Temp(Src) 98 F (36.7 C) (Oral)  Ht 5\' 9"  (1.753 m)  Wt 210 lb (95.255 kg)  BMI 31 kg/m2  SpO2 93%  Physical Exam  Physical Exam  Constitutional: He is oriented to person, place, and time and well-developed, well-nourished, and in no distress. No distress.  HENT:  Head: Normocephalic and atraumatic.  Eyes: Conjunctivae are  normal.  Neck: Neck supple. No thyromegaly present.  Cardiovascular: Normal rate, regular rhythm and normal heart sounds.   No murmur heard. Pulmonary/Chest: Effort normal and breath sounds normal. No respiratory distress.  Abdominal: He exhibits no distension and no mass. There is no tenderness.  Musculoskeletal: He exhibits no edema.  Neurological: He is alert and oriented to person, place, and time.  Skin: Skin is warm.  Psychiatric: Memory, affect and judgment normal.    Lab Results  Component Value Date   TSH 1.479 11/10/2012   Lab Results  Component Value Date   WBC 7.4 11/10/2012   HGB 14.4 11/10/2012   HCT 43.3 11/10/2012   MCV 88.7 11/10/2012   PLT 184 11/10/2012   Lab Results  Component Value Date   CREATININE 1.75* 11/19/2012   BUN 24* 11/19/2012   NA 138 11/19/2012   K 4.6 11/19/2012   CL 103 11/19/2012   CO2 28 11/19/2012   Lab Results  Component Value Date   ALT 10 11/10/2012   AST 10 11/10/2012   ALKPHOS 109 11/10/2012   BILITOT 0.6 11/10/2012   Lab Results  Component Value Date   CHOL 162 11/10/2012   Lab Results  Component Value Date   HDL 44 11/10/2012   Lab Results  Component Value Date   LDLCALC 83 11/10/2012   Lab Results  Component Value Date   TRIG 177* 11/10/2012   Lab Results  Component Value Date   CHOLHDL 3.7 11/10/2012     Assessment & Plan  HYPERTENSION Well controlled, no changes  DIABETES MELLITUS, TYPE II Continue current meds for now avoid simple carbs and recheck in 2 months time  COPD gold stage C. Has had Spiriva added by Pulmonology recently, he thinks this is helpful

## 2012-12-01 ENCOUNTER — Other Ambulatory Visit: Payer: Self-pay | Admitting: Internal Medicine

## 2012-12-02 ENCOUNTER — Other Ambulatory Visit: Payer: Self-pay | Admitting: Internal Medicine

## 2012-12-02 NOTE — Telephone Encounter (Signed)
Rx sent in to pharmacy. 

## 2013-01-06 ENCOUNTER — Other Ambulatory Visit: Payer: Self-pay | Admitting: Internal Medicine

## 2013-01-22 ENCOUNTER — Ambulatory Visit: Payer: Medicare Other | Admitting: Family Medicine

## 2013-01-27 ENCOUNTER — Ambulatory Visit: Payer: Medicare Other | Admitting: Family Medicine

## 2013-01-28 ENCOUNTER — Ambulatory Visit (INDEPENDENT_AMBULATORY_CARE_PROVIDER_SITE_OTHER): Payer: Medicare Other | Admitting: Family Medicine

## 2013-01-28 ENCOUNTER — Encounter: Payer: Self-pay | Admitting: Family Medicine

## 2013-01-28 VITALS — BP 120/80 | HR 92 | Temp 97.2°F | Ht 69.0 in | Wt 206.0 lb

## 2013-01-28 DIAGNOSIS — I10 Essential (primary) hypertension: Secondary | ICD-10-CM

## 2013-01-28 DIAGNOSIS — E785 Hyperlipidemia, unspecified: Secondary | ICD-10-CM

## 2013-01-28 DIAGNOSIS — E119 Type 2 diabetes mellitus without complications: Secondary | ICD-10-CM

## 2013-01-28 DIAGNOSIS — E663 Overweight: Secondary | ICD-10-CM

## 2013-01-28 NOTE — Patient Instructions (Addendum)
Consider Alli for weigh loss Labs prior to visit lipid, renal, cbc, tsh, hepatic, hgba1c  DASH Diet The DASH diet stands for "Dietary Approaches to Stop Hypertension." It is a healthy eating plan that has been shown to reduce high blood pressure (hypertension) in as little as 14 days, while also possibly providing other significant health benefits. These other health benefits include reducing the risk of breast cancer after menopause and reducing the risk of type 2 diabetes, heart disease, colon cancer, and stroke. Health benefits also include weight loss and slowing kidney failure in patients with chronic kidney disease.  DIET GUIDELINES  Limit salt (sodium). Your diet should contain less than 1500 mg of sodium daily.  Limit refined or processed carbohydrates. Your diet should include mostly whole grains. Desserts and added sugars should be used sparingly.  Include small amounts of heart-healthy fats. These types of fats include nuts, oils, and tub margarine. Limit saturated and trans fats. These fats have been shown to be harmful in the body. CHOOSING FOODS  The following food groups are based on a 2000 calorie diet. See your Registered Dietitian for individual calorie needs. Grains and Grain Products (6 to 8 servings daily)  Eat More Often: Whole-wheat bread, brown rice, whole-grain or wheat pasta, quinoa, popcorn without added fat or salt (air popped).  Eat Less Often: White bread, white pasta, white rice, cornbread. Vegetables (4 to 5 servings daily)  Eat More Often: Fresh, frozen, and canned vegetables. Vegetables may be raw, steamed, roasted, or grilled with a minimal amount of fat.  Eat Less Often/Avoid: Creamed or fried vegetables. Vegetables in a cheese sauce. Fruit (4 to 5 servings daily)  Eat More Often: All fresh, canned (in natural juice), or frozen fruits. Dried fruits without added sugar. One hundred percent fruit juice ( cup [237 mL] daily).  Eat Less Often: Dried fruits  with added sugar. Canned fruit in light or heavy syrup. [employer], Fish, and Poultry (2 servings or less daily. One serving is 3 to 4 oz [85-114 g]).  Eat More Often: Ninety percent or leaner ground beef, tenderloin, sirloin. Round cuts of beef, chicken breast, [place] breast. All fish. Grill, bake, or broil your meat. Nothing should be fried.  Eat Less Often/Avoid: Fatty cuts of meat, [place], or chicken leg, thigh, or wing. Fried cuts of meat or fish. Dairy (2 to 3 servings)  Eat More Often: Low-fat or fat-free milk, low-fat plain or light yogurt, reduced-fat or part-skim cheese.  Eat Less Often/Avoid: Milk (whole, 2%).Whole milk yogurt. Full-fat cheeses. Nuts, Seeds, and Legumes (4 to 5 servings per week)  Eat More Often: All without added salt.  Eat Less Often/Avoid: Salted nuts and seeds, canned beans with added salt. Fats and Sweets (limited)  Eat More Often: Vegetable oils, tub margarines without trans fats, sugar-free gelatin. Mayonnaise and salad dressings.  Eat Less Often/Avoid: Coconut oils, palm oils, butter, stick margarine, cream, half and half, cookies, candy, pie. FOR MORE INFORMATION The Dash Diet Eating Plan: www.dashdiet.org Document Released: 06/20/2011 Document Revised: 09/23/2011 Document Reviewed: 06/20/2011 [facility] Patient Information 2014 [place], [place].

## 2013-01-31 ENCOUNTER — Encounter: Payer: Self-pay | Admitting: Family Medicine

## 2013-01-31 DIAGNOSIS — E663 Overweight: Secondary | ICD-10-CM

## 2013-01-31 HISTORY — DX: Overweight: E66.3

## 2013-01-31 NOTE — Assessment & Plan Note (Signed)
Patient reports recent sugars have been tested infrequently but under 150, no change in meds today, continue same. Minimize simple carbs and recheck hgba1c prior to next visit

## 2013-01-31 NOTE — Assessment & Plan Note (Signed)
Avoid trans fats, take Krill oil caps, tolerating Lipitor 80 mg, no changes

## 2013-01-31 NOTE — Assessment & Plan Note (Signed)
Encouraged DASH diet and increased exercise, can try Alli if he is interested but is not a candidate for phentermine

## 2013-01-31 NOTE — Progress Notes (Signed)
Patient ID: Gregory Farrell, male   DOB: 03/06/1933, 79 y.o.   MRN: [id number] Gregory Farrell [id number] December 23, 1932 01/31/2013      Progress Note-Follow Up  Subjective  Chief Complaint  Chief Complaint  Patient presents with  . Follow-up    HPI  Patient is a 79 year old Caucasian male who is in today to discuss his ongoing medical concerns. Overall he feels well. He's not had any recent illness. Had any emergency room. No fevers, chills, headache, chest pain, palpitations, shortness of breath, GI or GU concerns. Denies polyuria or polydipsia. Is frustrated with difficulty losing weight. Taking medications as prescribed  Past Medical History  Diagnosis Date  . Hypertension   . COPD (chronic obstructive pulmonary disease)   . GERD (gastroesophageal reflux disease)   . Renal insufficiency   . Asthma   . Hyperlipidemia   . CAD (coronary artery disease)   . B12 deficiency   . Memory loss   . Vertigo   . Diabetes mellitus     type II, poly neuropathy  . Overweight 01/31/2013    Past Surgical History  Procedure Laterality Date  . Coronary angioplasty with stent placement    . Inguinal hernia repair      Family History  Problem Relation Age of Onset  . Coronary artery disease Mother   . Breast cancer Mother   . Diabetes Mother     History   Social History  . Marital Status: Married    Spouse Name: N/A    Number of Children: 1  . Years of Education: N/A   Occupational History  . Retired     [employer]   Social History Main Topics  . Smoking status: Former Smoker -- 1.00 packs/day for 15 years    Types: Cigarettes    Quit date: 07/15/2002  . Smokeless tobacco: Never Used  . Alcohol Use: No  . Drug Use: Not on file  . Sexually Active: Not on file   Other Topics Concern  . Not on file   Social History Narrative  . No narrative on file    Current Outpatient Prescriptions on File Prior to Visit  Medication Sig Dispense Refill  . albuterol (PROVENTIL HFA;VENTOLIN  HFA) 108 (90 BASE) MCG/ACT inhaler Inhale 2 puffs into the lungs every 6 (six) hours as needed for wheezing.  1 Inhaler  1  . amLODipine (NORVASC) 10 MG tablet TAKE HALF TABLET BY MOUTH DAILY  45 tablet  0  . aspirin 81 MG tablet Take 81 mg by mouth daily.        Marland Kitchen atorvastatin (LIPITOR) 80 MG tablet Take 1 tablet (80 mg total) by mouth daily.  30 tablet  5  . citalopram (CELEXA) 20 MG tablet Take 2 tablets (40 mg total) by mouth daily.  60 tablet  3  . glimepiride (AMARYL) 2 MG tablet TAKE ONE TABLET BY MOUTH ONE TIME DAILY BEFORE BREAKFAST  30 tablet  0  . lisinopril (PRINIVIL,ZESTRIL) 10 MG tablet TAKE HALF TABLET BY MOUTH DAILY  45 tablet  1  . omeprazole (PRILOSEC) 20 MG capsule TAKE ONE CAPSULE BY MOUTH ONE TIME DAILY  90 capsule  0  . sitaGLIPtin (JANUVIA) 50 MG tablet Take 1 tablet (50 mg total) by mouth daily.  28 tablet  0  . tiotropium (SPIRIVA HANDIHALER) 18 MCG inhalation capsule Place 1 capsule (18 mcg total) into inhaler and inhale daily.  30 capsule  11   No current facility-administered medications on file prior to  visit.    Allergies  Allergen Reactions  . Exenatide     REACTION: nausea    Review of Systems  Review of Systems  Constitutional: Negative for fever and malaise/fatigue.  HENT: Negative for congestion.   Eyes: Negative for discharge.  Respiratory: Negative for shortness of breath.   Cardiovascular: Negative for chest pain, palpitations and leg swelling.  Gastrointestinal: Negative for nausea, abdominal pain and diarrhea.  Genitourinary: Negative for dysuria.  Musculoskeletal: Negative for falls.  Skin: Negative for rash.  Neurological: Negative for loss of consciousness and headaches.  Endo/Heme/Allergies: Negative for polydipsia.  Psychiatric/Behavioral: Negative for depression and suicidal ideas. The patient is not nervous/anxious and does not have insomnia.     Objective  BP 120/80  Pulse 92  Temp(Src) 97.2 F (36.2 C) (Oral)  Ht 5\' 9"  (1.753  m)  Wt 206 lb (93.441 kg)  BMI 30.41 kg/m2  SpO2 94%  Physical Exam  Physical Exam  Constitutional: He is oriented to person, place, and time and well-developed, well-nourished, and in no distress. No distress.  HENT:  Head: Normocephalic and atraumatic.  Eyes: Conjunctivae are normal.  Neck: Neck supple. No thyromegaly present.  Cardiovascular: Normal rate, regular rhythm and normal heart sounds.   No murmur heard. Pulmonary/Chest: Effort normal and breath sounds normal. No respiratory distress.  Abdominal: He exhibits no distension and no mass. There is no tenderness.  Musculoskeletal: He exhibits no edema and no tenderness.  Neurological: He is alert and oriented to person, place, and time.  Skin: Skin is warm.  Psychiatric: Memory, affect and judgment normal.    Lab Results  Component Value Date   TSH 1.479 11/10/2012   Lab Results  Component Value Date   WBC 7.4 11/10/2012   HGB 14.4 11/10/2012   HCT 43.3 11/10/2012   MCV 88.7 11/10/2012   PLT 184 11/10/2012   Lab Results  Component Value Date   CREATININE 1.75* 11/19/2012   BUN 24* 11/19/2012   NA 138 11/19/2012   K 4.6 11/19/2012   CL 103 11/19/2012   CO2 28 11/19/2012   Lab Results  Component Value Date   ALT 10 11/10/2012   AST 10 11/10/2012   ALKPHOS 109 11/10/2012   BILITOT 0.6 11/10/2012   Lab Results  Component Value Date   CHOL 162 11/10/2012   Lab Results  Component Value Date   HDL 44 11/10/2012   Lab Results  Component Value Date   LDLCALC 83 11/10/2012   Lab Results  Component Value Date   TRIG 177* 11/10/2012   Lab Results  Component Value Date   CHOLHDL 3.7 11/10/2012     Assessment & Plan  DIABETES MELLITUS, TYPE II Patient reports recent sugars have been tested infrequently but under 150, no change in meds today, continue same. Minimize simple carbs and recheck hgba1c prior to next visit   HYPERTENSION Well controlled, no changes  HYPERLIPIDEMIA Avoid trans fats, take Krill oil caps,  tolerating Lipitor 80 mg, no changes  Overweight Encouraged DASH diet and increased exercise, can try Alli if he is interested but is not a candidate for phentermine

## 2013-01-31 NOTE — Assessment & Plan Note (Signed)
Well controlled, no changes 

## 2013-02-07 ENCOUNTER — Other Ambulatory Visit: Payer: Self-pay | Admitting: Family Medicine

## 2013-02-07 ENCOUNTER — Other Ambulatory Visit: Payer: Self-pay | Admitting: Internal Medicine

## 2013-02-08 NOTE — Telephone Encounter (Signed)
Rx request to pharmacy/SLS  

## 2013-02-11 ENCOUNTER — Ambulatory Visit: Payer: Medicare Other | Admitting: Critical Care Medicine

## 2013-03-01 ENCOUNTER — Ambulatory Visit (INDEPENDENT_AMBULATORY_CARE_PROVIDER_SITE_OTHER): Payer: Medicare Other | Admitting: Critical Care Medicine

## 2013-03-01 ENCOUNTER — Encounter: Payer: Self-pay | Admitting: Critical Care Medicine

## 2013-03-01 VITALS — BP 124/72 | HR 83 | Temp 97.8°F | Ht 69.0 in | Wt 201.0 lb

## 2013-03-01 DIAGNOSIS — J449 Chronic obstructive pulmonary disease, unspecified: Secondary | ICD-10-CM

## 2013-03-01 MED ORDER — MOMETASONE FURO-FORMOTEROL FUM 200-5 MCG/ACT IN AERO: 2.0000 | INHALATION_SPRAY | Freq: Two times a day (BID) | RESPIRATORY_TRACT | Status: DC

## 2013-03-01 NOTE — Patient Instructions (Addendum)
Stop spiriva Start Dulera 200 two puff twice daily, use spacer Return 6 weeks [place] office

## 2013-03-01 NOTE — Progress Notes (Signed)
Subjective:    Patient ID: Gregory Farrell, male    DOB: 08-21-1932, 79 y.o.   MRN: [id number]  HPI 03/01/2013 Chief Complaint  Patient presents with  . Follow-up    Pt reports breathing has unchanged-no better no worse. Denies any wheezing, no chest tx, no cough, no CP. Pt reports used the spiriva for about 2 months and did not help so he stopped taking this medication.   Pt unimproved on spiriva.  Stopped med after two months.  No real wheeze.  No real change in breathing.  No real cough or mucus production.  No real chest pain.    Past Medical History  Diagnosis Date  . Hypertension   . COPD (chronic obstructive pulmonary disease)   . GERD (gastroesophageal reflux disease)   . Renal insufficiency   . Asthma   . Hyperlipidemia   . CAD (coronary artery disease)   . B12 deficiency   . Memory loss   . Vertigo   . Diabetes mellitus     type II, poly neuropathy  . Overweight 01/31/2013     Family History  Problem Relation Age of Onset  . Coronary artery disease Mother   . Breast cancer Mother   . Diabetes Mother      History   Social History  . Marital Status: Married    Spouse Name: N/A    Number of Children: 1  . Years of Education: N/A   Occupational History  . Retired     [employer]   Social History Main Topics  . Smoking status: Former Smoker -- 1.00 packs/day for 15 years    Types: Cigarettes    Quit date: 07/15/2002  . Smokeless tobacco: Never Used  . Alcohol Use: No  . Drug Use: Not on file  . Sexual Activity: Not on file   Other Topics Concern  . Not on file   Social History Narrative  . No narrative on file     Allergies  Allergen Reactions  . Exenatide     REACTION: nausea     Outpatient Prescriptions Prior to Visit  Medication Sig Dispense Refill  . albuterol (PROVENTIL HFA;VENTOLIN HFA) 108 (90 BASE) MCG/ACT inhaler Inhale 2 puffs into the lungs every 6 (six) hours as needed for wheezing.  1 Inhaler  1  . amLODipine (NORVASC) 10 MG tablet  Take half tablet by mouth daily  45 tablet  2  . aspirin 81 MG tablet Take 81 mg by mouth daily.        Marland Kitchen atorvastatin (LIPITOR) 80 MG tablet Take 1 tablet (80 mg total) by mouth daily.  30 tablet  5  . citalopram (CELEXA) 20 MG tablet Take 2 tablets (40 mg total) by mouth daily.  60 tablet  3  . glimepiride (AMARYL) 2 MG tablet TAKE ONE TABLET BY MOUTH EVERY MORNING BEFORE BREAKFAST   30 tablet  2  . JANUVIA 50 MG tablet Take one tablet by mouth one time daily  30 tablet  5  . lisinopril (PRINIVIL,ZESTRIL) 10 MG tablet TAKE HALF TABLET BY MOUTH DAILY  45 tablet  1  . omeprazole (PRILOSEC) 20 MG capsule TAKE ONE CAPSULE BY MOUTH ONE TIME DAILY  90 capsule  0  . tiotropium (SPIRIVA HANDIHALER) 18 MCG inhalation capsule Place 1 capsule (18 mcg total) into inhaler and inhale daily.  30 capsule  11   No facility-administered medications prior to visit.      Review of Systems  Constitutional: Positive for  appetite change, fatigue and unexpected weight change. Negative for chills, diaphoresis and activity change.  HENT: Positive for neck stiffness. Negative for hearing loss, nosebleeds, congestion, facial swelling, sneezing, mouth sores, trouble swallowing, dental problem, voice change, postnasal drip, sinus pressure, tinnitus and ear discharge.   Eyes: Negative for photophobia, discharge, itching and visual disturbance.  Respiratory: Negative for apnea, cough, choking, chest tightness and stridor.   Cardiovascular: Negative for palpitations.  Gastrointestinal: Positive for constipation. Negative for nausea, blood in stool and abdominal distention.  Genitourinary: Negative for dysuria, urgency, frequency, hematuria, flank pain, decreased urine volume and difficulty urinating.  Musculoskeletal: Positive for myalgias, arthralgias and gait problem. Negative for back pain and joint swelling.  Skin: Negative for color change and pallor.  Neurological: Negative for dizziness, tremors, seizures, syncope,  speech difficulty, weakness, light-headedness and numbness.  Hematological: Negative for adenopathy. Bruises/bleeds easily.  Psychiatric/Behavioral: Positive for sleep disturbance. Negative for confusion and agitation. The patient is not nervous/anxious.        Objective:   Physical Exam  Filed Vitals:   03/01/13 1406  BP: 124/72  Pulse: 83  Temp: 97.8 F (36.6 C)  TempSrc: Oral  Height: 5\' 9"  (1.753 m)  Weight: 201 lb (91.173 kg)  SpO2: 95%    Gen: Pleasant, well-nourished, in no distress,  normal affect  ENT: No lesions,  mouth clear,  oropharynx clear, no postnasal drip  Neck: No JVD, no TMG, no carotid bruits  Lungs: No use of accessory muscles, no dullness to percussion, distant breath sounds  Cardiovascular: RRR, heart sounds normal, no murmur or gallops, no peripheral edema  Abdomen: soft and NT, no HSM,  BS normal  Musculoskeletal: No deformities, no cyanosis or clubbing  Neuro: alert, non focal  Skin: Warm, no lesions or rashes  Spirometry 11/12/2012: FEV1 45% predicted FVC 60% predicted FEV1 FEC ratio 45% predicted  CT Chest 10/20/12 IMPRESSION:  1. Centrilobular emphysema with bibasilar scarring. No evidence  of left-sided pulmonary lesion to correspond to the reported  abnormal tracer uptake.  2. Superior segment right lower lobe pulmonary nodule. Given risk  factors for bronchogenic carcinoma, follow-up chest CT at 1 year is  recommended. This recommendation follows the consensus statement:  "Guidelines for Management of Small Pulmonary Nodules Detected on  CT Scans: A Statement from the Fleischner Society" as published in  Radiology 2005; 237:395-400. Available online at:  DietDisorder.cz.  3. Advanced atherosclerosis, including within the coronary  arteries.  4. Moderate hiatal hernia.  5. Left adrenal adenoma.        Assessment & Plan:   COPD gold stage C. Gold stage C. COPD with primary emphysematous  component and failure to respond to Spiriva Plan Stop spiriva Start Dulera 200 two puff twice daily, use spacer Return 6 weeks [place] [facility]     Updated Medication List Outpatient Encounter Prescriptions as of 03/01/2013  Medication Sig Dispense Refill  . albuterol (PROVENTIL HFA;VENTOLIN HFA) 108 (90 BASE) MCG/ACT inhaler Inhale 2 puffs into the lungs every 6 (six) hours as needed for wheezing.  1 Inhaler  1  . amLODipine (NORVASC) 10 MG tablet Take half tablet by mouth daily  45 tablet  2  . aspirin 81 MG tablet Take 81 mg by mouth daily.        Marland Kitchen atorvastatin (LIPITOR) 80 MG tablet Take 1 tablet (80 mg total) by mouth daily.  30 tablet  5  . citalopram (CELEXA) 20 MG tablet Take 2 tablets (40 mg total) by mouth daily.  60 tablet  3  . glimepiride (AMARYL) 2 MG tablet TAKE ONE TABLET BY MOUTH EVERY MORNING BEFORE BREAKFAST   30 tablet  2  . JANUVIA 50 MG tablet Take one tablet by mouth one time daily  30 tablet  5  . lisinopril (PRINIVIL,ZESTRIL) 10 MG tablet TAKE HALF TABLET BY MOUTH DAILY  45 tablet  1  . omeprazole (PRILOSEC) 20 MG capsule TAKE ONE CAPSULE BY MOUTH ONE TIME DAILY  90 capsule  0  . mometasone-formoterol (DULERA) 200-5 MCG/ACT AERO Inhale 2 puffs into the lungs 2 (two) times daily.  1 Inhaler  6  . mometasone-formoterol (DULERA) 200-5 MCG/ACT AERO Inhale 2 puffs into the lungs 2 (two) times daily.  1 Inhaler  0  . [DISCONTINUED] tiotropium (SPIRIVA HANDIHALER) 18 MCG inhalation capsule Place 1 capsule (18 mcg total) into inhaler and inhale daily.  30 capsule  11   No facility-administered encounter medications on file as of 03/01/2013.

## 2013-03-01 NOTE — Assessment & Plan Note (Signed)
Gold stage C. COPD with primary emphysematous component and failure to respond to Spiriva Plan Stop spiriva Start Dulera 200 two puff twice daily, use spacer Return 6 weeks [place] [facility]

## 2013-03-13 ENCOUNTER — Other Ambulatory Visit: Payer: Self-pay | Admitting: Family

## 2013-03-30 ENCOUNTER — Telehealth: Payer: Self-pay

## 2013-03-30 DIAGNOSIS — E785 Hyperlipidemia, unspecified: Secondary | ICD-10-CM

## 2013-03-30 DIAGNOSIS — E119 Type 2 diabetes mellitus without complications: Secondary | ICD-10-CM

## 2013-03-30 DIAGNOSIS — I10 Essential (primary) hypertension: Secondary | ICD-10-CM

## 2013-03-30 LAB — RENAL FUNCTION PANEL
BUN: 23 mg/dL (ref 6–23)
Chloride: 103 mEq/L (ref 96–112)
Creat: 1.79 mg/dL — ABNORMAL HIGH (ref 0.50–1.35)
Glucose, Bld: 159 mg/dL — ABNORMAL HIGH (ref 70–99)
Potassium: 4.8 mEq/L (ref 3.5–5.3)

## 2013-03-30 LAB — LIPID PANEL
Cholesterol: 202 mg/dL — ABNORMAL HIGH (ref 0–200)
Total CHOL/HDL Ratio: 5.2 Ratio
Triglycerides: 177 mg/dL — ABNORMAL HIGH (ref <150)

## 2013-03-30 LAB — CBC
HCT: 42.9 % (ref 39.0–52.0)
Hemoglobin: 14.3 g/dL (ref 13.0–17.0)
MCH: 29.8 pg (ref 26.0–34.0)
MCHC: 33.3 g/dL (ref 30.0–36.0)
RDW: 14.5 % (ref 11.5–15.5)

## 2013-03-30 LAB — HEPATIC FUNCTION PANEL
ALT: 11 U/L (ref 0–53)
AST: 14 U/L (ref 0–37)
Albumin: 3.6 g/dL (ref 3.5–5.2)
Alkaline Phosphatase: 115 U/L (ref 39–117)
Total Protein: 6.5 g/dL (ref 6.0–8.3)

## 2013-03-30 NOTE — Telephone Encounter (Signed)
Lab order placed.

## 2013-04-07 ENCOUNTER — Ambulatory Visit (INDEPENDENT_AMBULATORY_CARE_PROVIDER_SITE_OTHER): Payer: Medicare Other | Admitting: Family Medicine

## 2013-04-07 ENCOUNTER — Encounter: Payer: Self-pay | Admitting: Family Medicine

## 2013-04-07 VITALS — BP 148/90 | HR 78 | Temp 97.4°F | Ht 69.0 in | Wt 206.0 lb

## 2013-04-07 DIAGNOSIS — E119 Type 2 diabetes mellitus without complications: Secondary | ICD-10-CM

## 2013-04-07 DIAGNOSIS — N259 Disorder resulting from impaired renal tubular function, unspecified: Secondary | ICD-10-CM

## 2013-04-07 DIAGNOSIS — I10 Essential (primary) hypertension: Secondary | ICD-10-CM

## 2013-04-07 DIAGNOSIS — E785 Hyperlipidemia, unspecified: Secondary | ICD-10-CM

## 2013-04-07 DIAGNOSIS — D696 Thrombocytopenia, unspecified: Secondary | ICD-10-CM

## 2013-04-07 DIAGNOSIS — R7989 Other specified abnormal findings of blood chemistry: Secondary | ICD-10-CM

## 2013-04-07 LAB — CBC
MCH: 30.9 pg (ref 26.0–34.0)
MCHC: 33.9 g/dL (ref 30.0–36.0)
MCV: 91.2 fL (ref 78.0–100.0)
Platelets: 105 10*3/uL — ABNORMAL LOW (ref 150–400)
RDW: 14.4 % (ref 11.5–15.5)

## 2013-04-07 MED ORDER — INSULIN PEN NEEDLE 32G X 6 MM MISC: Status: DC

## 2013-04-07 MED ORDER — INSULIN DETEMIR 100 UNIT/ML ~~LOC~~ SOLN: 10.0000 [IU] | Freq: Every day | SUBCUTANEOUS | Status: DC

## 2013-04-07 NOTE — Patient Instructions (Addendum)
Insulin Detemir injection What is this medicine? INSULIN DETEMIR (IN su lin DE te mir) is a human-made form of insulin. This drug lowers the amount of sugar in your blood. It is a long-acting insulin that is usually given once or twice a day. This medicine may be used for other purposes; ask your health care provider or pharmacist if you have questions. What should I tell my health care provider before I take this medicine? They need to know if you have any of these conditions: -episodes of hypoglycemia -kidney disease -liver disease -an unusual or allergic reaction to insulin, metacresol, other medicines, foods, dyes, or preservatives -pregnant or trying to get pregnant -breast-feeding How should I use this medicine? This medicine is for injection under the skin. Use exactly as directed. It is important to follow the directions given to you by your health care professional or doctor. Your doctor or health care professional will teach you how to give yourself injections. If you utilize an insulin injector device, you will be taught how to use it, prime it, and how to refill the device with the insulin cartridges. You will be taught how to adjust doses for activities and illness. Do not use more insulin than prescribed. Do not use more or less often than prescribed. Always check the appearance of your insulin before using it. This medicine should be clear and colorless like water. Do not use if it is cloudy, thickened, colored, or has solid particles in it. It is important that you put your used needles and syringes in a special sharps container. Do not put them in a trash can. If you do not have a sharps container, call your pharmacist or healthcare provider to get one. Talk to your pediatrician regarding the use of this medicine in children. While this drug may be prescribed for children as young as 2 years for selected conditions, precautions do apply. Overdosage: If you think you have taken too  much of this medicine contact a poison control center or emergency room at once. NOTE: This medicine is only for you. Do not share this medicine with others. What if I miss a dose? It is important not to miss a dose. Your health care professional or doctor should discuss a plan for missed doses with you. If you do miss a dose, follow their plan. Do not take double doses. What may interact with this medicine? -other medicines for diabetes Many medications may cause an increase or decrease in blood sugar, these include: -alcohol containing beverages -aspirin and aspirin-like drugs -chloramphenicol -chromium -diuretics -male hormones, like estrogens or progestins and birth control pills -heart medicines -isoniazid -MAOIs like Carbex, Eldepryl, Marplan, Nardil, and Parnate -male hormones or anabolic steroids -medicines for weight loss -medicines for allergies, asthma, cold, or cough -medicines for mental problems -niacin -NSAIDs, medicines for pain and inflammation, like ibuprofen or naproxen -pentamidine -phenytoin -probenecid -quinolone antibiotics like ciprofloxacin, levofloxacin, ofloxacin -some herbal dietary supplements -steroid medicines like prednisone or cortisone -thyroid medicine Some medications can hide the warning symptoms of low blood sugar. You may need to monitor your blood sugar more closely if you are taking one of these medications. These include: -beta-blockers such as atenolol, metoprolol, propranolol -clonidine -guanethidine -reserpine This list may not describe all possible interactions. Give your health care provider a list of all the medicines, herbs, non-prescription drugs, or dietary supplements you use. Also tell them if you smoke, drink alcohol, or use illegal drugs. Some items may interact with your medicine. What should  I watch for while using this medicine? Visit your health care professional or doctor for regular checks on your progress. To control  your diabetes you must use this medicine regularly and follow a diet and exercise schedule. Checking and recording your blood sugar and urine ketone levels regularly is important. Use a blood sugar measuring device before you treat high or low blood sugar. Always carry a quick-source of sugar with you in case you have symptoms of low blood sugar. Examples include hard sugar candy or glucose tablets. Make sure family members know that you can choke if you eat or drink when you develop serious symptoms of low blood sugar, such as seizures or unconsciousness. They must get medical help at once. Make sure that you have the right kind of syringe for the type of insulin you use. Try not to change the brand and type of insulin or syringe unless your health care professional or doctor tells you to. Switching insulin brand or type can cause dangerously high or low blood sugar. Always keep an extra supply of insulin, syringes, and needles on hand. Use a syringe one time only. Throw away syringe and needle in a closed container to prevent accidental needle sticks. Insulin pens and cartridges should never be shared. Sharing may result in passing of viruses like hepatitis or HIV. Wear a medical identification bracelet or chain to say you have diabetes, and carry a card that lists all your medications. Many nonprescription cough and cold products contain sugar or alcohol. These can affect diabetes control or can alter the results of tests used to monitor blood sugar. Avoid alcohol. Avoid products that contain alcohol or sugar. What side effects may I notice from receiving this medicine? Side effects that you should report to your health care professional or doctor as soon as possible: Symptoms of low blood sugar: -You may feel nervous, confused, dizzy, hungry, weak, sweaty, shaky, cold, and irritable. You may also experience headache, blurred vision, rapid heartbeat and loss of consciousness. Symptoms of high blood  sugar: -You may experience dizziness, dry mouth, dry skin, fruity breath, loss of appetite, nausea, stomach ache, unusual thirst, frequent urination Insulin also can cause rare but serious allergic reactions in some patients, including: -bad skin rash and itching -breathing problems Side effects that usually do not require medical attention (report to your health care professional or doctor if they continue or are bothersome): -increase or decrease in fatty tissue under the skin, through overuse of a particular injection location -itching, burning, swelling, or rash where injected This list may not describe all possible side effects. Call your doctor for medical advice about side effects. You may report side effects to FDA at 1-800-FDA-1088. Where should I keep my medicine? Keep out of the reach of children. Store unopened cartridges, FlexPens, or Levemir Innolet systems in a refrigerator between 2 and 8 degrees C (36 and 46 degrees F.) Do not freeze or use if the insulin has been frozen. Once opened, the Levemir Innolet system, FlexPen, and cartridges that are inserted into pens should be kept at room temperature, below 30 degrees C (86 degrees F). Do not store in the refrigerator once opened. Once opened, the insulin can be used for 42 days. After 42 days, the cartridge, Levemir Innolet system or FlexPen should be thrown away. Store unopened insulin vials in a refrigerator between 2 and 8 degrees C (36 and 46 degrees F). Do not freeze or use if the insulin has been frozen. Opened vials (vials currently  in use) should be stored in a refrigerator, never a freezer. If refrigeration is not possible, the opened vial can be stored unrefrigerated at room temperature, below 30 degrees C (86 degrees F) for up to 42 days. After 42 days, the vial of insulin should be thrown away. Keeping your insulin at room temperature decreases the amount of pain during injection. Protect from light and excessive heat. Throw  away any unused medicine after the expiration date or after the specified time for room temperature storage has passed. NOTE: This sheet is a summary. It may not cover all possible information. If you have questions about this medicine, talk to your doctor, pharmacist, or health care provider.  2012, Elsevier/Gold Standard. (12/06/2010 4:20:34 PM)

## 2013-04-11 DIAGNOSIS — D696 Thrombocytopenia, unspecified: Secondary | ICD-10-CM | POA: Insufficient documentation

## 2013-04-11 NOTE — Progress Notes (Signed)
Patient ID: [place] ANTOLIN, male   DOB: May 27, 1933, 79 y.o.   MRN: [id number] RUDIE SERMONS [id number] 02/10/1933 04/11/2013      Progress Note-Follow Up  Subjective  Chief Complaint  Chief Complaint  Patient presents with  . Follow-up    HPI  Patient is a 79 year old Caucasian male who is in today for followup. He notes his blood sugars have been running from behind. He's also been noting some mild leg cramps and decrease in his appetite recently but denies any other acute complaints. No chest pain or palpitations. No shortness of breath back pain or abdominal pain. No nausea, vomiting, diarrhea, anorexia. No headache or GU complaints noted.  Past Medical History  Diagnosis Date  . Hypertension   . COPD (chronic obstructive pulmonary disease)   . GERD (gastroesophageal reflux disease)   . Renal insufficiency   . Asthma   . Hyperlipidemia   . CAD (coronary artery disease)   . B12 deficiency   . Memory loss   . Vertigo   . Diabetes mellitus     type II, poly neuropathy  . Overweight 01/31/2013    Past Surgical History  Procedure Laterality Date  . Coronary angioplasty with stent placement    . Inguinal hernia repair      Family History  Problem Relation Age of Onset  . Coronary artery disease Mother   . Breast cancer Mother   . Diabetes Mother     History   Social History  . Marital Status: Married    Spouse Name: N/A    Number of Children: 1  . Years of Education: N/A   Occupational History  . Retired     [employer]   Social History Main Topics  . Smoking status: Former Smoker -- 1.00 packs/day for 15 years    Types: Cigarettes    Quit date: 07/15/2002  . Smokeless tobacco: Never Used  . Alcohol Use: No  . Drug Use: Not on file  . Sexual Activity: Not on file   Other Topics Concern  . Not on file   Social History Narrative  . No narrative on file    Current Outpatient Prescriptions on File Prior to Visit  Medication Sig Dispense Refill  . albuterol  (PROVENTIL HFA;VENTOLIN HFA) 108 (90 BASE) MCG/ACT inhaler Inhale 2 puffs into the lungs every 6 (six) hours as needed for wheezing.  1 Inhaler  1  . amLODipine (NORVASC) 10 MG tablet Take half tablet by mouth daily  45 tablet  2  . aspirin 81 MG tablet Take 81 mg by mouth daily.        Marland Kitchen atorvastatin (LIPITOR) 80 MG tablet Take 1 tablet (80 mg total) by mouth daily.  30 tablet  5  . citalopram (CELEXA) 20 MG tablet Take 2 tablets (40 mg total) by mouth daily.  60 tablet  3  . JANUVIA 50 MG tablet Take one tablet by mouth one time daily  30 tablet  5  . lisinopril (PRINIVIL,ZESTRIL) 10 MG tablet TAKE HALF TABLET BY MOUTH DAILY  45 tablet  1  . mometasone-formoterol (DULERA) 200-5 MCG/ACT AERO Inhale 2 puffs into the lungs 2 (two) times daily.  1 Inhaler  6  . mometasone-formoterol (DULERA) 200-5 MCG/ACT AERO Inhale 2 puffs into the lungs 2 (two) times daily.  1 Inhaler  0  . omeprazole (PRILOSEC) 20 MG capsule Take one capsule by mouth one time daily  90 capsule  0   No current facility-administered medications  on file prior to visit.    Allergies  Allergen Reactions  . Exenatide     REACTION: nausea    Review of Systems  Review of Systems  Constitutional: Negative for fever and malaise/fatigue.  HENT: Negative for congestion.   Eyes: Negative for discharge.  Respiratory: Negative for shortness of breath.   Cardiovascular: Negative for chest pain, palpitations and leg swelling.  Gastrointestinal: Negative for nausea, abdominal pain and diarrhea.  Genitourinary: Negative for dysuria.  Musculoskeletal: Negative for falls.  Skin: Negative for rash.  Neurological: Negative for loss of consciousness and headaches.  Endo/Heme/Allergies: Negative for polydipsia.  Psychiatric/Behavioral: Negative for depression and suicidal ideas. The patient is not nervous/anxious and does not have insomnia.     Objective  BP 148/90  Pulse 78  Temp(Src) 97.4 F (36.3 C) (Oral)  Ht 5\' 9"  (1.753 m)   Wt 206 lb (93.441 kg)  BMI 30.41 kg/m2  SpO2 94%  Physical Exam  Physical Exam  Constitutional: He is oriented to person, place, and time and well-developed, well-nourished, and in no distress. No distress.  HENT:  Head: Normocephalic and atraumatic.  Eyes: Conjunctivae are normal.  Neck: Neck supple. No thyromegaly present.  Cardiovascular: Normal rate, regular rhythm and normal heart sounds.   No murmur heard. Pulmonary/Chest: Effort normal and breath sounds normal. No respiratory distress.  Abdominal: He exhibits no distension and no mass. There is no tenderness.  Musculoskeletal: He exhibits no edema.  Neurological: He is alert and oriented to person, place, and time.  Skin: Skin is warm.  Psychiatric: Memory, affect and judgment normal.    Lab Results  Component Value Date   TSH 1.396 03/30/2013   Lab Results  Component Value Date   WBC 7.9 04/07/2013   HGB 14.7 04/07/2013   HCT 43.4 04/07/2013   MCV 91.2 04/07/2013   PLT 105* 04/07/2013   Lab Results  Component Value Date   CREATININE 1.79* 03/30/2013   BUN 23 03/30/2013   NA 138 03/30/2013   K 4.8 03/30/2013   CL 103 03/30/2013   CO2 26 03/30/2013   Lab Results  Component Value Date   ALT 11 03/30/2013   AST 14 03/30/2013   ALKPHOS 115 03/30/2013   BILITOT 0.7 03/30/2013   Lab Results  Component Value Date   CHOL 202* 03/30/2013   Lab Results  Component Value Date   HDL 39* 03/30/2013   Lab Results  Component Value Date   LDLCALC 128* 03/30/2013   Lab Results  Component Value Date   TRIG 177* 03/30/2013   Lab Results  Component Value Date   CHOLHDL 5.2 03/30/2013     Assessment & Plan  DIABETES MELLITUS, TYPE II hgba1c worsening and has tried numerous meds in past. Will start on Levemir, has already taken his Amaryl today so nursing instructs him on Pen use and gives him 5 units after checking his sugar, tomorrow he will increase to 10 units. Given samples. Return in 1 month for review of sugar log and  call if any concerns.  RENAL INSUFFICIENCY Stable, maintain adequate hydration. Will monitor  HYPERLIPIDEMIA Numbers up slightly but will agressively treat sugar and recheck before changing meds, aovid trans fats and simple carbs. Consider DASH diet with small meals every 4 hours or so  HYPERTENSION Mild elevation encouraged DASH diet and continue current meds for now  Thrombocytopenia, unspecified Mild but worsening, warned regarding signs of increased bleeding and repeat CBC prior to next visit

## 2013-04-11 NOTE — Assessment & Plan Note (Signed)
Mild but worsening, warned regarding signs of increased bleeding and repeat CBC prior to next visit

## 2013-04-11 NOTE — Assessment & Plan Note (Signed)
Mild elevation encouraged DASH diet and continue current meds for now

## 2013-04-11 NOTE — Assessment & Plan Note (Signed)
Stable, maintain adequate hydration. Will monitor

## 2013-04-11 NOTE — Assessment & Plan Note (Signed)
hgba1c worsening and has tried numerous meds in past. Will start on Levemir, has already taken his Amaryl today so nursing instructs him on Pen use and gives him 5 units after checking his sugar, tomorrow he will increase to 10 units. Given samples. Return in 1 month for review of sugar log and call if any concerns.

## 2013-04-11 NOTE — Assessment & Plan Note (Signed)
Numbers up slightly but will agressively treat sugar and recheck before changing meds, aovid trans fats and simple carbs. Consider DASH diet with small meals every 4 hours or so

## 2013-04-12 ENCOUNTER — Ambulatory Visit: Payer: Medicare Other | Admitting: Critical Care Medicine

## 2013-04-13 ENCOUNTER — Other Ambulatory Visit: Payer: Self-pay | Admitting: Family Medicine

## 2013-04-13 DIAGNOSIS — D696 Thrombocytopenia, unspecified: Secondary | ICD-10-CM

## 2013-04-13 LAB — CBC
Hemoglobin: 14.5 g/dL (ref 13.0–17.0)
MCH: 30.9 pg (ref 26.0–34.0)
MCHC: 33.7 g/dL (ref 30.0–36.0)
RDW: 14.3 % (ref 11.5–15.5)

## 2013-04-15 NOTE — Progress Notes (Signed)
Quick Note:  Patient Informed and voiced understanding.  Labs ordered ______

## 2013-04-15 NOTE — Addendum Note (Signed)
Addended by: Court Joy on: 04/15/2013 04:54 PM   Modules accepted: Orders

## 2013-04-16 LAB — CBC
Hemoglobin: 13.9 g/dL (ref 13.0–17.0)
MCH: 30.4 pg (ref 26.0–34.0)
MCHC: 33.2 g/dL (ref 30.0–36.0)
MCV: 91.7 fL (ref 78.0–100.0)
RBC: 4.57 MIL/uL (ref 4.22–5.81)
RDW: 14.5 % (ref 11.5–15.5)

## 2013-04-27 ENCOUNTER — Encounter: Payer: Self-pay | Admitting: Family Medicine

## 2013-04-27 ENCOUNTER — Telehealth: Payer: Self-pay | Admitting: Family Medicine

## 2013-04-27 ENCOUNTER — Ambulatory Visit (INDEPENDENT_AMBULATORY_CARE_PROVIDER_SITE_OTHER): Payer: Medicare Other | Admitting: Family Medicine

## 2013-04-27 VITALS — BP 118/72 | HR 87 | Temp 98.2°F | Ht 69.0 in | Wt 205.1 lb

## 2013-04-27 DIAGNOSIS — E538 Deficiency of other specified B group vitamins: Secondary | ICD-10-CM

## 2013-04-27 DIAGNOSIS — D696 Thrombocytopenia, unspecified: Secondary | ICD-10-CM

## 2013-04-27 DIAGNOSIS — N259 Disorder resulting from impaired renal tubular function, unspecified: Secondary | ICD-10-CM

## 2013-04-27 DIAGNOSIS — E119 Type 2 diabetes mellitus without complications: Secondary | ICD-10-CM

## 2013-04-27 DIAGNOSIS — I10 Essential (primary) hypertension: Secondary | ICD-10-CM

## 2013-04-27 DIAGNOSIS — Z23 Encounter for immunization: Secondary | ICD-10-CM

## 2013-04-27 DIAGNOSIS — E785 Hyperlipidemia, unspecified: Secondary | ICD-10-CM

## 2013-04-27 LAB — CBC
MCH: 29.9 pg (ref 26.0–34.0)
MCHC: 33 g/dL (ref 30.0–36.0)
MCV: 90.5 fL (ref 78.0–100.0)
Platelets: 163 10*3/uL (ref 150–400)
RBC: 4.72 MIL/uL (ref 4.22–5.81)
RDW: 14.5 % (ref 11.5–15.5)
WBC: 8.3 10*3/uL (ref 4.0–10.5)

## 2013-04-27 NOTE — Telephone Encounter (Signed)
Labs prior to visit hgba1c, cbc, tsh, lipid, renal hepatic   Patient has appointment 07/06/13 and will be going to [facility] lab prior.

## 2013-04-27 NOTE — Patient Instructions (Signed)

## 2013-04-29 ENCOUNTER — Telehealth: Payer: Self-pay

## 2013-04-29 NOTE — Telephone Encounter (Signed)
Patient left a message stating he was told that his platelets were normal now. Pt would like to know if MD still wants him to go to Dr Myna Hidalgo? Please advise?

## 2013-04-29 NOTE — Telephone Encounter (Signed)
Now that they are better he does not have to see Dr Myna Hidalgo for now. We will just monitor for now

## 2013-04-30 ENCOUNTER — Telehealth: Payer: Self-pay | Admitting: Hematology & Oncology

## 2013-04-30 NOTE — Telephone Encounter (Signed)
Notified pt and he voices understanding. 

## 2013-04-30 NOTE — Telephone Encounter (Signed)
New patient called to cancel his appt for 10/28, and said he will make another appt later, if he needs too.

## 2013-05-01 ENCOUNTER — Encounter: Payer: Self-pay | Admitting: Family Medicine

## 2013-05-01 NOTE — Assessment & Plan Note (Signed)
Improved

## 2013-05-01 NOTE — Assessment & Plan Note (Signed)
Well controlled. No changes. 

## 2013-05-01 NOTE — Assessment & Plan Note (Signed)
Tolerating Levemir and Januvia, avoid simple carbs check sugars tid and prn.

## 2013-05-01 NOTE — Assessment & Plan Note (Signed)
Stable no cahnges.

## 2013-05-01 NOTE — Progress Notes (Signed)
Patient ID: Gregory Farrell, male   DOB: [birth year], 79 y.o.   MRN: [id number] Gregory Farrell [id number] Nov 14, 1932 05/01/2013      Progress Note-Follow Up  Subjective  Chief Complaint  Chief Complaint  Patient presents with  . Follow-up    3 week  . Injections    prevnar    HPI  Patient is a 79 year old Caucasian male in today for followup. He is here today to discuss blood sugars. Feels well. Denies chest pain, palpitations, shortness of breath, GI or GU concerns at this time. Is taking medications as prescribed.  Past Medical History  Diagnosis Date  . Hypertension   . COPD (chronic obstructive pulmonary disease)   . GERD (gastroesophageal reflux disease)   . Renal insufficiency   . Asthma   . Hyperlipidemia   . CAD (coronary artery disease)   . B12 deficiency   . Memory loss   . Vertigo   . Diabetes mellitus     type II, poly neuropathy  . Overweight 01/31/2013    Past Surgical History  Procedure Laterality Date  . Coronary angioplasty with stent placement    . Inguinal hernia repair      Family History  Problem Relation Age of Onset  . Coronary artery disease Mother   . Breast cancer Mother   . Diabetes Mother     History   Social History  . Marital Status: Married    Spouse Name: N/A    Number of Children: 1  . Years of Education: N/A   Occupational History  . Retired     [employer]   Social History Main Topics  . Smoking status: Former Smoker -- 1.00 packs/day for 15 years    Types: Cigarettes    Quit date: 07/15/2002  . Smokeless tobacco: Never Used  . Alcohol Use: No  . Drug Use: Not on file  . Sexual Activity: Not on file   Other Topics Concern  . Not on file   Social History Narrative  . No narrative on file    Current Outpatient Prescriptions on File Prior to Visit  Medication Sig Dispense Refill  . albuterol (PROVENTIL HFA;VENTOLIN HFA) 108 (90 BASE) MCG/ACT inhaler Inhale 2 puffs into the lungs every 6 (six) hours as needed for  wheezing.  1 Inhaler  1  . amLODipine (NORVASC) 10 MG tablet Take half tablet by mouth daily  45 tablet  2  . aspirin 81 MG tablet Take 81 mg by mouth daily.        Marland Kitchen atorvastatin (LIPITOR) 80 MG tablet Take 1 tablet (80 mg total) by mouth daily.  30 tablet  5  . citalopram (CELEXA) 20 MG tablet Take 2 tablets (40 mg total) by mouth daily.  60 tablet  3  . insulin detemir (LEVEMIR) 100 UNIT/ML injection Inject 0.1 mLs (10 Units total) into the skin at bedtime.  10 mL  1  . Insulin Pen Needle 32G X 6 MM MISC Use once a day with Levemir  50 each  1  . JANUVIA 50 MG tablet Take one tablet by mouth one time daily  30 tablet  5  . lisinopril (PRINIVIL,ZESTRIL) 10 MG tablet TAKE HALF TABLET BY MOUTH DAILY  45 tablet  1  . mometasone-formoterol (DULERA) 200-5 MCG/ACT AERO Inhale 2 puffs into the lungs 2 (two) times daily.  1 Inhaler  6  . omeprazole (PRILOSEC) 20 MG capsule Take one capsule by mouth one time daily  90 capsule  0   No current facility-administered medications on file prior to visit.    Allergies  Allergen Reactions  . Exenatide     REACTION: nausea    Review of Systems  Review of Systems  Constitutional: Negative for fever and malaise/fatigue.  HENT: Negative for congestion.   Eyes: Negative for discharge.  Respiratory: Negative for shortness of breath.   Cardiovascular: Negative for chest pain, palpitations and leg swelling.  Gastrointestinal: Negative for nausea, abdominal pain and diarrhea.  Genitourinary: Negative for dysuria.  Musculoskeletal: Negative for falls.  Skin: Negative for rash.  Neurological: Negative for loss of consciousness and headaches.  Endo/Heme/Allergies: Negative for polydipsia.  Psychiatric/Behavioral: Negative for depression and suicidal ideas. The patient is not nervous/anxious and does not have insomnia.     Objective  BP 118/72  Pulse 87  Temp(Src) 98.2 F (36.8 C) (Oral)  Ht 5\' 9"  (1.753 m)  Wt 205 lb 1.3 oz (93.024 kg)  BMI 30.27  kg/m2  SpO2 95%  Physical Exam  Physical Exam  Constitutional: He is oriented to person, place, and time and well-developed, well-nourished, and in no distress. No distress.  HENT:  Head: Normocephalic and atraumatic.  Eyes: Conjunctivae are normal.  Neck: Neck supple. No thyromegaly present.  Cardiovascular: Normal rate, regular rhythm and normal heart sounds.   No murmur heard. Pulmonary/Chest: Effort normal and breath sounds normal. No respiratory distress.  Abdominal: He exhibits no distension and no mass. There is no tenderness.  Musculoskeletal: He exhibits no edema.  Neurological: He is alert and oriented to person, place, and time. Coordination abnormal.  Skin: Skin is warm.  Psychiatric: Memory, affect and judgment normal.    Lab Results  Component Value Date   TSH 1.396 03/30/2013   Lab Results  Component Value Date   WBC 8.3 04/27/2013   HGB 14.1 04/27/2013   HCT 42.7 04/27/2013   MCV 90.5 04/27/2013   PLT 163 04/27/2013   Lab Results  Component Value Date   CREATININE 1.79* 03/30/2013   BUN 23 03/30/2013   NA 138 03/30/2013   K 4.8 03/30/2013   CL 103 03/30/2013   CO2 26 03/30/2013   Lab Results  Component Value Date   ALT 11 03/30/2013   AST 14 03/30/2013   ALKPHOS 115 03/30/2013   BILITOT 0.7 03/30/2013   Lab Results  Component Value Date   CHOL 202* 03/30/2013   Lab Results  Component Value Date   HDL 39* 03/30/2013   Lab Results  Component Value Date   LDLCALC 128* 03/30/2013   Lab Results  Component Value Date   TRIG 177* 03/30/2013   Lab Results  Component Value Date   CHOLHDL 5.2 03/30/2013     Assessment & Plan  HYPERTENSION Well controlled  No changes.   DIABETES MELLITUS, TYPE II Tolerating Levemir and Januvia, avoid simple carbs check sugars tid and prn.  RENAL INSUFFICIENCY Stable no cahnges.   Thrombocytopenia, unspecified Improved.

## 2013-05-11 ENCOUNTER — Other Ambulatory Visit: Payer: Medicare Other | Admitting: Lab

## 2013-05-11 ENCOUNTER — Ambulatory Visit: Payer: Medicare Other

## 2013-05-11 ENCOUNTER — Ambulatory Visit: Payer: Medicare Other | Admitting: Hematology & Oncology

## 2013-06-07 ENCOUNTER — Other Ambulatory Visit: Payer: Self-pay | Admitting: Family

## 2013-06-07 ENCOUNTER — Other Ambulatory Visit: Payer: Self-pay | Admitting: Family Medicine

## 2013-06-07 NOTE — Telephone Encounter (Signed)
Rx request to pharmacy/SLS  

## 2013-06-13 ENCOUNTER — Other Ambulatory Visit: Payer: Self-pay | Admitting: Family Medicine

## 2013-07-06 ENCOUNTER — Encounter: Payer: Self-pay | Admitting: Family Medicine

## 2013-07-06 ENCOUNTER — Ambulatory Visit (INDEPENDENT_AMBULATORY_CARE_PROVIDER_SITE_OTHER): Payer: Medicare Other | Admitting: Family Medicine

## 2013-07-06 VITALS — BP 110/72 | HR 97 | Temp 97.7°F | Ht 69.0 in | Wt 201.1 lb

## 2013-07-06 DIAGNOSIS — I10 Essential (primary) hypertension: Secondary | ICD-10-CM

## 2013-07-06 DIAGNOSIS — E785 Hyperlipidemia, unspecified: Secondary | ICD-10-CM

## 2013-07-06 DIAGNOSIS — N259 Disorder resulting from impaired renal tubular function, unspecified: Secondary | ICD-10-CM

## 2013-07-06 DIAGNOSIS — E119 Type 2 diabetes mellitus without complications: Secondary | ICD-10-CM

## 2013-07-06 DIAGNOSIS — I2510 Atherosclerotic heart disease of native coronary artery without angina pectoris: Secondary | ICD-10-CM

## 2013-07-06 LAB — RENAL FUNCTION PANEL
Albumin: 3.6 g/dL (ref 3.5–5.2)
BUN: 25 mg/dL — ABNORMAL HIGH (ref 6–23)
CO2: 24 mEq/L (ref 19–32)
Chloride: 102 mEq/L (ref 96–112)
Creat: 1.73 mg/dL — ABNORMAL HIGH (ref 0.50–1.35)
Glucose, Bld: 233 mg/dL — ABNORMAL HIGH (ref 70–99)
Sodium: 137 mEq/L (ref 135–145)

## 2013-07-06 LAB — HEPATIC FUNCTION PANEL
ALT: 11 U/L (ref 0–53)
AST: 14 U/L (ref 0–37)
Albumin: 3.6 g/dL (ref 3.5–5.2)
Alkaline Phosphatase: 130 U/L — ABNORMAL HIGH (ref 39–117)
Bilirubin, Direct: 0.1 mg/dL (ref 0.0–0.3)
Total Protein: 6.8 g/dL (ref 6.0–8.3)

## 2013-07-06 LAB — LIPID PANEL
Cholesterol: 182 mg/dL (ref 0–200)
LDL Cholesterol: 98 mg/dL (ref 0–99)
Triglycerides: 203 mg/dL — ABNORMAL HIGH (ref <150)
VLDL: 41 mg/dL — ABNORMAL HIGH (ref 0–40)

## 2013-07-06 LAB — HEMOGLOBIN A1C
Hgb A1c MFr Bld: 9.9 % — ABNORMAL HIGH
Mean Plasma Glucose: 237 mg/dL — ABNORMAL HIGH

## 2013-07-06 LAB — TSH: TSH: 1.149 u[IU]/mL (ref 0.350–4.500)

## 2013-07-06 NOTE — Patient Instructions (Signed)

## 2013-07-06 NOTE — Progress Notes (Signed)
Pre visit review using our clinic review tool, if applicable. No additional management support is needed unless otherwise documented below in the visit note. 

## 2013-07-06 NOTE — Assessment & Plan Note (Addendum)
Increase Levemir to 12 units and check hgba1c today which continues to trend up. Is resistent to checking his sugars. Encouraged to minimize carbs and return in one month for recheck

## 2013-07-06 NOTE — Assessment & Plan Note (Signed)
Well controlled, no changes 

## 2013-07-07 LAB — URINALYSIS
Hgb urine dipstick: NEGATIVE
Nitrite: NEGATIVE
Specific Gravity, Urine: 1.025 (ref 1.005–1.030)
Urobilinogen, UA: 0.2 mg/dL (ref 0.0–1.0)

## 2013-07-07 LAB — MICROALBUMIN / CREATININE URINE RATIO
Microalb Creat Ratio: 19.8 mg/g (ref 0.0–30.0)
Microalb, Ur: 7.72 mg/dL — ABNORMAL HIGH (ref 0.00–1.89)

## 2013-07-08 LAB — URINE CULTURE
Colony Count: NO GROWTH
Organism ID, Bacteria: NO GROWTH

## 2013-07-11 NOTE — Assessment & Plan Note (Signed)
asymptomatic

## 2013-07-11 NOTE — Assessment & Plan Note (Signed)
stable °

## 2013-07-16 ENCOUNTER — Other Ambulatory Visit: Payer: Self-pay | Admitting: Family Medicine

## 2013-08-16 ENCOUNTER — Ambulatory Visit (INDEPENDENT_AMBULATORY_CARE_PROVIDER_SITE_OTHER): Payer: Managed Care, Other (non HMO) | Admitting: Family Medicine

## 2013-08-16 ENCOUNTER — Encounter: Payer: Self-pay | Admitting: Family Medicine

## 2013-08-16 VITALS — BP 108/80 | HR 84 | Temp 97.6°F | Ht 69.0 in | Wt 203.1 lb

## 2013-08-16 DIAGNOSIS — N259 Disorder resulting from impaired renal tubular function, unspecified: Secondary | ICD-10-CM

## 2013-08-16 DIAGNOSIS — J449 Chronic obstructive pulmonary disease, unspecified: Secondary | ICD-10-CM

## 2013-08-16 DIAGNOSIS — E119 Type 2 diabetes mellitus without complications: Secondary | ICD-10-CM

## 2013-08-16 DIAGNOSIS — I10 Essential (primary) hypertension: Secondary | ICD-10-CM

## 2013-08-16 DIAGNOSIS — E785 Hyperlipidemia, unspecified: Secondary | ICD-10-CM

## 2013-08-16 LAB — GLUCOSE, POCT (MANUAL RESULT ENTRY): POC Glucose: 204 mg/dl — AB (ref 70–99)

## 2013-08-16 NOTE — Patient Instructions (Signed)
Fasting blood sugars 80 to 120 but you will want yours roughtly 100 to 130 for now  After dinner 100 to 150   Basic Carbohydrate Counting Basic carbohydrate counting is a way to plan meals. It is done by counting the amount of carbohydrate in foods. Foods that have carbohydrates are starches (grains, beans, starchy vegetables) and sweets. Eating carbohydrates increases blood glucose (sugar) levels. People with diabetes use carbohydrate counting to help keep their blood glucose at a normal level.  COUNTING CARBOHYDRATES IN FOODS The first step in counting carbohydrates is to learn how many carbohydrate servings you should have in every meal. A dietitian can plan this for you. After learning the amount of carbohydrates to include in your meal plan, you can start to choose the carbohydrate-containing foods you want to eat.  There are 2 ways to identify the amount of carbohydrates in the foods you eat.  Read the Nutrition Facts panel on food labels. You need 2 pieces of information from the Nutrition Facts panel to count carbohydrates this way:  Serving size.  Total carbohydrate (in grams). Decide how many servings you will be eating. If it is 1 serving, you will be eating the amount of carbohydrate listed on the panel. If you will be eating 2 servings, you will be eating double the amount of carbohydrate listed on the panel.   Learn serving sizes. A serving size of most carbohydrate-containing foods is about 15 grams (g). Listed below are single serving sizes of common carbohydrate-containing foods:  1 slice bread.   cup unsweetened, dry cereal.   cup hot cereal.   cup rice.   cup mashed potatoes.   cup pasta.  1 cup fresh fruit.   cup canned fruit.  1 cup milk (whole, 2%, or skim).   cup starchy vegetables (peas, corn, or potatoes). Counting carbohydrates this way is similar to looking on the Nutrition Facts panel. Decide how many servings you will eat first. Multiply the  number of servings you eat by 15 g. For example, if you have 2 cups of strawberries, you had 2 servings. That means you had 30 g of carbohydrate (2 servings x 15 g = 30 g). CALCULATING CARBOHYDRATES IN A MEAL Sample dinner  3 oz chicken breast.   cup brown rice.   cup corn.  1 cup fat-free milk.  1 cup strawberries with sugar-free whipped topping. Carbohydrate calculation First, identify the foods that contain carbohydrate:  Rice.  Corn.  Milk.  Strawberries. Calculate the number of servings eaten:  2 servings rice.  1 serving corn.  1 serving milk.  1 serving strawberries. Multiply the number of servings by 15 g:  2 servings rice x 15 g = 30 g.  1 serving corn x 15 g = 15 g.  1 serving milk x 15 g = 15 g.  1 serving strawberries x 15 g = 15 g. Add the amounts to find the total carbohydrates eaten: 30 g + 15 g + 15 g + 15 g = 75 g carbohydrate eaten at dinner. Document Released: 07/01/2005 Document Revised: 09/23/2011 Document Reviewed: 05/17/2011 [facility] Patient Information 2014 [place], [place].   You can increase Levemir by 2 units every 3 days if no blood sugars below 100 Check sugars whenever you feel bad ie shaky, sweaty, anxious etc

## 2013-08-16 NOTE — Progress Notes (Signed)
Pre visit review using our clinic review tool, if applicable. No additional management support is needed unless otherwise documented below in the visit note. 

## 2013-08-16 NOTE — Progress Notes (Signed)
Subjective:     Patient ID: Gregory Farrell, male   DOB: 12/21/1932, 80 y.o.   MRN: [id number]  CC: f/u DM ty II  HPI  Levemir was increased to 12 units at last visit (07/06/2013). After his lab results came back, the pt was called and asked to increased insulin to 14 units.  HgbA1c was 9.3 in 03/2013 and then increased to 9.9 in 06/2013. Pt was encouraged to minimize simple carbs at last visit and again discussed at this visit. He reports polyuria x 1 year but denies polydipsia or polyphagia. States his last eye exam was 6+ years ago. He does not wear eyeglasses.  Pt does not see a podiatrist. Reports no injuries to his feet that he is aware of and denies any recent falls.   Review of Systems  Constitutional: Negative for activity change and appetite change.  Endocrine: Positive for polyuria. Negative for polydipsia and polyphagia.  Genitourinary: Positive for frequency. Negative for urgency.       Denies dribbling, difficulties starting or stopping flow, and denies double voiding.   Neurological: Negative for dizziness, light-headedness and headaches.   Patient Active Problem List   Diagnosis Date Noted  . Thrombocytopenia, unspecified 04/11/2013  . Overweight 01/31/2013  . Lung nodules 11/18/2012  . Claudication 05/27/2012  . Back pain 12/01/2011  . Hearing decreased 09/08/2011  . Adjustment disorder with depressed mood 11/28/2010  . DYSPNEA 08/02/2009  . GERD 11/21/2008  . B12 DEFICIENCY 02/17/2008  . MEMORY LOSS 01/28/2008  . DIABETES MELLITUS, TYPE II 02/04/2007  . HYPERLIPIDEMIA 02/04/2007  . HYPERTENSION 02/04/2007  . CORONARY ARTERY DISEASE 02/04/2007  . COPD gold stage C. 02/04/2007  . RENAL INSUFFICIENCY 02/04/2007   Current Outpatient Prescriptions on File Prior to Visit  Medication Sig Dispense Refill  . albuterol (PROVENTIL HFA;VENTOLIN HFA) 108 (90 BASE) MCG/ACT inhaler Inhale 2 puffs into the lungs every 6 (six) hours as needed for wheezing.  1 Inhaler  1  . amLODipine  (NORVASC) 10 MG tablet Take half tablet by mouth daily  45 tablet  2  . aspirin 81 MG tablet Take 81 mg by mouth daily.        Marland Kitchen atorvastatin (LIPITOR) 80 MG tablet Take one tablet by mouth one time daily  30 tablet  4  . citalopram (CELEXA) 20 MG tablet Take two tablets by mouth daily  60 tablet  2  . insulin detemir (LEVEMIR) 100 UNIT/ML injection Inject 14 Units into the skin at bedtime.       . Insulin Pen Needle 32G X 6 MM MISC Use once a day with Levemir  50 each  1  . JANUVIA 50 MG tablet Take one tablet by mouth one time daily  30 tablet  5  . lisinopril (PRINIVIL,ZESTRIL) 10 MG tablet Take half tablet by mouth daily  45 tablet  0  . mometasone-formoterol (DULERA) 200-5 MCG/ACT AERO Inhale 2 puffs into the lungs 2 (two) times daily.  1 Inhaler  6  . omeprazole (PRILOSEC) 20 MG capsule TAKE ONE CAPSULE BY MOUTH ONE TIME DAILY   90 capsule  0   No current facility-administered medications on file prior to visit.       Objective:   Physical Exam  Nursing note and vitals reviewed. Constitutional: Vital signs are normal. He appears well-developed and well-nourished. No distress.  HENT:  Head: Normocephalic and atraumatic.  Pulmonary/Chest: Effort normal. No respiratory distress.  Skin: No rash noted.  Psychiatric: He has a normal mood and  affect.    Lab Results  Component Value Date   HGBA1C 9.9* 07/06/2013   Lab Results  Component Value Date   CHOL 182 07/06/2013   HDL 43 07/06/2013   LDLCALC 98 07/06/2013   LDLDIRECT 85.4 04/06/2008   TRIG 203* 07/06/2013   CHOLHDL 4.2 07/06/2013   Lab Results  Component Value Date   CREATININE 1.73* 07/06/2013   BUN 25* 07/06/2013   NA 137 07/06/2013   K 5.0 07/06/2013   CL 102 07/06/2013   CO2 24 07/06/2013     Filed Vitals:   08/16/13 1516  BP: 108/80  Pulse: 84  Temp: 97.6 F (36.4 C)   Wt Readings from Last 3 Encounters:  08/16/13 203 lb 1.3 oz (92.116 kg)  07/06/13 201 lb 1.9 oz (91.227 kg)  04/27/13 205 lb 1.3 oz  (93.024 kg)      Assessment:      1. Diabetes- Pt plans to make an opthalmologist appointment.  2. Overweight- Pt was educated today about dietary adjustments.  3. Hyperlipidemia, managed.  4. Renal Insufficiency, history of.  5. HTN, controlled.  6. COPD, gold stage C.  7. GERD, controlled.  8. Adjustment disorder with depression, history.     Plan:      1. Diabetes - Continue current regimen but start checking blood sugar levels. Pt was educated about checking blood sugar levels and how to increase his Levemir dosing appropriately. He expressed understanding and was given a paper script for a blood glucose monitoring kit. He will f/u in 1 month.  2. Overweight - Pt was educated about portion control and ChooseMyPlate concepts.  3. Hyperlipidemia - continue current regimen on atorvastatin.  4. Renal Insufficiency history- continue to monitor with labwork 5. HTN - no changes made.  6. COPD - no changes made.  7. GERD - no changes made.  8. Adjustment disorder with depression - no changes made.    02.02.2015   Martin[place] Shatarra Wehling, PA-S     Patient seen, examined and interviewed with student agree with note

## 2013-08-18 ENCOUNTER — Encounter: Payer: Self-pay | Admitting: Family Medicine

## 2013-08-18 NOTE — Assessment & Plan Note (Signed)
Worsening numbers. Long discussion was had. Patient agrees to start checking his sugars. Encouraged to take daily or even twice a day. Encouraged to check prior to breakfast an hour after his largest meal. Will increase his Levemir by 4 units at this time and then he is encouraged to increase by 2 units every 3 days as long as no sugars are under 100. Minimize simple carbs.

## 2013-08-18 NOTE — Assessment & Plan Note (Signed)
Mild, stable.  

## 2013-08-18 NOTE — Assessment & Plan Note (Signed)
Tolerating atorvastatin. Avoid transplants. Good response to

## 2013-08-18 NOTE — Progress Notes (Signed)
Patient ID: Gregory Farrell, male   DOB: September 16, 1932, 80 y.o.   MRN: [id number] NIKOLAY DEMETRIOU [id number] 12-13-1932 08/18/2013      Progress Note-Follow Up  Subjective  Chief Complaint  Chief Complaint  Patient presents with  . Follow-up    on Blood sugar    HPI  Patient is a 80 year old Caucasian male who is in today discuss his worsening sugar numbers in multiple medical conditions. He reports generally feeling well. He denies any recent illness. His blood sugar in the office tomorrow for an he does acknowledge polyuria. No significant polydipsia. Denies chest pain, palpitations or shortness of breath. No GI or GU complaints at this time. Has not been taking his blood sugars at home.  Past Medical History  Diagnosis Date  . Hypertension   . COPD (chronic obstructive pulmonary disease)   . GERD (gastroesophageal reflux disease)   . Renal insufficiency   . Asthma   . Hyperlipidemia   . CAD (coronary artery disease)   . B12 deficiency   . Memory loss   . Vertigo   . Diabetes mellitus     type II, poly neuropathy  . Overweight 01/31/2013    Past Surgical History  Procedure Laterality Date  . Coronary angioplasty with stent placement    . Inguinal hernia repair      Family History  Problem Relation Age of Onset  . Coronary artery disease Mother   . Breast cancer Mother   . Diabetes Mother     History   Social History  . Marital Status: Married    Spouse Name: N/A    Number of Children: 1  . Years of Education: N/A   Occupational History  . Retired     [employer]   Social History Main Topics  . Smoking status: Former Smoker -- 1.00 packs/day for 15 years    Types: Cigarettes    Quit date: 07/15/2002  . Smokeless tobacco: Never Used  . Alcohol Use: No  . Drug Use: Not on file  . Sexual Activity: Not on file   Other Topics Concern  . Not on file   Social History Narrative  . No narrative on file    Current Outpatient Prescriptions on File Prior to Visit   Medication Sig Dispense Refill  . albuterol (PROVENTIL HFA;VENTOLIN HFA) 108 (90 BASE) MCG/ACT inhaler Inhale 2 puffs into the lungs every 6 (six) hours as needed for wheezing.  1 Inhaler  1  . amLODipine (NORVASC) 10 MG tablet Take half tablet by mouth daily  45 tablet  2  . aspirin 81 MG tablet Take 81 mg by mouth daily.        Marland Kitchen atorvastatin (LIPITOR) 80 MG tablet Take one tablet by mouth one time daily  30 tablet  4  . citalopram (CELEXA) 20 MG tablet Take two tablets by mouth daily  60 tablet  2  . insulin detemir (LEVEMIR) 100 UNIT/ML injection Inject 14 Units into the skin at bedtime.       . Insulin Pen Needle 32G X 6 MM MISC Use once a day with Levemir  50 each  1  . JANUVIA 50 MG tablet Take one tablet by mouth one time daily  30 tablet  5  . lisinopril (PRINIVIL,ZESTRIL) 10 MG tablet Take half tablet by mouth daily  45 tablet  0  . mometasone-formoterol (DULERA) 200-5 MCG/ACT AERO Inhale 2 puffs into the lungs 2 (two) times daily.  1 Inhaler  6  .  omeprazole (PRILOSEC) 20 MG capsule TAKE ONE CAPSULE BY MOUTH ONE TIME DAILY   90 capsule  0   No current facility-administered medications on file prior to visit.    Allergies  Allergen Reactions  . Exenatide     REACTION: nausea    Review of Systems  Review of Systems  Constitutional: Negative for fever and malaise/fatigue.  HENT: Negative for congestion.   Eyes: Negative for discharge.  Respiratory: Negative for shortness of breath.   Cardiovascular: Negative for chest pain, palpitations and leg swelling.  Gastrointestinal: Negative for nausea, abdominal pain and diarrhea.  Genitourinary: Negative for dysuria.  Musculoskeletal: Negative for falls.  Skin: Negative for rash.  Neurological: Negative for loss of consciousness and headaches.  Endo/Heme/Allergies: Negative for polydipsia.  Psychiatric/Behavioral: Negative for depression and suicidal ideas. The patient is not nervous/anxious and does not have insomnia.      Objective  BP 108/80  Pulse 84  Temp(Src) 97.6 F (36.4 C) (Oral)  Ht 5\' 9"  (1.753 m)  Wt 203 lb 1.3 oz (92.116 kg)  BMI 29.98 kg/m2  SpO2 93%  Physical Exam  Physical Exam  Constitutional: He is oriented to person, place, and time and well-developed, well-nourished, and in no distress. No distress.  HENT:  Head: Normocephalic and atraumatic.  Eyes: Conjunctivae are normal.  Neck: Neck supple. No thyromegaly present.  Cardiovascular: Normal rate, regular rhythm and normal heart sounds.   No murmur heard. Pulmonary/Chest: Effort normal and breath sounds normal. No respiratory distress.  Abdominal: He exhibits no distension and no mass. There is no tenderness.  Musculoskeletal: He exhibits no edema.  Neurological: He is alert and oriented to person, place, and time.  Skin: Skin is warm.  Psychiatric: Memory, affect and judgment normal.    Lab Results  Component Value Date   TSH 1.149 07/06/2013   Lab Results  Component Value Date   WBC 8.3 04/27/2013   HGB 14.1 04/27/2013   HCT 42.7 04/27/2013   MCV 90.5 04/27/2013   PLT 163 04/27/2013   Lab Results  Component Value Date   CREATININE 1.73* 07/06/2013   BUN 25* 07/06/2013   NA 137 07/06/2013   K 5.0 07/06/2013   CL 102 07/06/2013   CO2 24 07/06/2013   Lab Results  Component Value Date   ALT 11 07/06/2013   AST 14 07/06/2013   ALKPHOS 130* 07/06/2013   BILITOT 0.8 07/06/2013   Lab Results  Component Value Date   CHOL 182 07/06/2013   Lab Results  Component Value Date   HDL 43 07/06/2013   Lab Results  Component Value Date   LDLCALC 98 07/06/2013   Lab Results  Component Value Date   TRIG 203* 07/06/2013   Lab Results  Component Value Date   CHOLHDL 4.2 07/06/2013     Assessment & Plan  HYPERTENSION Well controlled, no changes.   DIABETES MELLITUS, TYPE II Worsening numbers. Long discussion was had. Patient agrees to start checking his sugars. Encouraged to take daily or even twice  a day. Encouraged to check prior to breakfast an hour after his largest meal. Will increase his Levemir by 4 units at this time and then he is encouraged to increase by 2 units every 3 days as long as no sugars are under 100. Minimize simple carbs.  HYPERLIPIDEMIA Tolerating atorvastatin. Avoid transplants. Good response to  COPD gold stage C. No recent exacerbation.  RENAL INSUFFICIENCY Mild, stable

## 2013-08-18 NOTE — Assessment & Plan Note (Signed)
Well controlled, no changes 

## 2013-08-18 NOTE — Assessment & Plan Note (Signed)
No recent exacerbation 

## 2013-08-19 ENCOUNTER — Telehealth: Payer: Self-pay

## 2013-08-19 NOTE — Telephone Encounter (Signed)
Pt left a message stating that he has a few questions about checking his BS.  Pt would like to know if he uses sweet and low will that raise his BS numbers?  Pt also stated that when he checked his sugar it was 161, 152, 160 all within a few minutes. Pt is wandering if this is accurate? I informed pt that it can vary a little.

## 2013-08-19 NOTE — Telephone Encounter (Signed)
Sweet and Low (Saccharin) is an artificial sweetener that is safely used by diabetics.  It should not affect his blood sugar.  Other safe options include -- Aspartame (NutraSweet, Equal), Sucralose (Splenda), or Stevia (Pure Via, [place]).  It is normal for his blood sugar to vary by a few points with repeat measurements taken within a few minutes of each other.  I would not advise that he do multiple sugar checks at the same time.  He should check his blood sugar as instructed by Dr. Charlett Blake.

## 2013-08-19 NOTE — Telephone Encounter (Signed)
Relevant patient education mailed to patient.  

## 2013-08-20 NOTE — Telephone Encounter (Signed)
Patient informed, understood & agreed/SLS  

## 2013-08-23 ENCOUNTER — Telehealth: Payer: Self-pay | Admitting: Family Medicine

## 2013-08-23 MED ORDER — INSULIN DETEMIR 100 UNIT/ML FLEXPEN: 14.0000 [IU] | Freq: Every day | SUBCUTANEOUS | Status: DC

## 2013-08-23 NOTE — Telephone Encounter (Signed)
Send to [facility] on [address] for levemir flex touch   Dr b gave him a sample that he is finished with

## 2013-08-23 NOTE — Telephone Encounter (Signed)
Rx request to pharmacy/SLS  

## 2013-08-25 LAB — HM DIABETES EYE EXAM

## 2013-09-01 ENCOUNTER — Other Ambulatory Visit: Payer: Self-pay | Admitting: Family Medicine

## 2013-09-01 ENCOUNTER — Encounter: Payer: Self-pay | Admitting: Family Medicine

## 2013-09-11 ENCOUNTER — Other Ambulatory Visit: Payer: Self-pay | Admitting: Family Medicine

## 2013-09-13 ENCOUNTER — Telehealth: Payer: Self-pay

## 2013-09-13 MED ORDER — CITALOPRAM HYDROBROMIDE 20 MG PO TABS: 40.0000 mg | ORAL_TABLET | Freq: Every day | ORAL | Status: DC

## 2013-09-13 NOTE — Telephone Encounter (Signed)
Received refill request for citalopram and received the following warning.  Please advise if ok to still refill?   High   Drug-Drug: omeprazole and citalopram  Plasma concentrations and toxic effects of Citalopram may be increased by concomitant administration of Omeprazole. Specifically, citalopram doses greater than 20 mg/day are not recommended in patients receiving Omeprazole according to official package labeling due to the risk of QT prolongation.

## 2013-09-13 NOTE — Telephone Encounter (Signed)
See if he would be willing to try another PPI such as Protonix which shows no chance for interaction. If not then at least need an EKG

## 2013-09-13 NOTE — Telephone Encounter (Signed)
Pt is ok to stop omeprazole and start Protonix. Please verify dose?

## 2013-09-13 NOTE — Telephone Encounter (Signed)
D/c Omeprazole and start Protonix 40 mg po daily disp #30 with 5 rf or #90 with 1

## 2013-09-14 MED ORDER — PANTOPRAZOLE SODIUM 40 MG PO TBEC: 40.0000 mg | DELAYED_RELEASE_TABLET | Freq: Every day | ORAL | Status: DC

## 2013-09-17 ENCOUNTER — Ambulatory Visit: Payer: Managed Care, Other (non HMO) | Admitting: Family Medicine

## 2013-09-21 ENCOUNTER — Encounter: Payer: Self-pay | Admitting: Family Medicine

## 2013-09-21 ENCOUNTER — Ambulatory Visit (INDEPENDENT_AMBULATORY_CARE_PROVIDER_SITE_OTHER): Payer: Managed Care, Other (non HMO) | Admitting: Family Medicine

## 2013-09-21 VITALS — BP 110/68 | HR 84 | Temp 97.8°F | Ht 69.0 in | Wt 200.0 lb

## 2013-09-21 DIAGNOSIS — I10 Essential (primary) hypertension: Secondary | ICD-10-CM

## 2013-09-21 DIAGNOSIS — E119 Type 2 diabetes mellitus without complications: Secondary | ICD-10-CM

## 2013-09-21 DIAGNOSIS — E663 Overweight: Secondary | ICD-10-CM

## 2013-09-21 MED ORDER — INSULIN DETEMIR 100 UNIT/ML FLEXPEN: 22.0000 [IU] | Freq: Every day | SUBCUTANEOUS | Status: DC

## 2013-09-21 NOTE — Patient Instructions (Signed)

## 2013-09-21 NOTE — Progress Notes (Signed)
Pre visit review using our clinic review tool, if applicable. No additional management support is needed unless otherwise documented below in the visit note. 

## 2013-09-26 ENCOUNTER — Encounter: Payer: Self-pay | Admitting: Family Medicine

## 2013-09-26 NOTE — Progress Notes (Signed)
Patient ID: Gregory Farrell, male   DOB: Oct 01, 1932, 80 y.o.   MRN: [id number] MELIK BLANCETT [id number] 1932/09/25 09/26/2013      Progress Note-Follow Up  Subjective  Chief Complaint  Chief Complaint  Patient presents with  . Follow-up    on diabetes    HPI  Patient is a 80 year old male in today for routine medical care. Is checking his blood sugars with ease now. His numbers are improving. He is generally around 200 now. No numbers below 100. Denies any polyuria or polydipsia. Denies CP/palp/SOB/HA/congestion/fevers/GI or GU c/o. Taking meds as prescribed  Past Medical History  Diagnosis Date  . Hypertension   . COPD (chronic obstructive pulmonary disease)   . GERD (gastroesophageal reflux disease)   . Renal insufficiency   . Asthma   . Hyperlipidemia   . CAD (coronary artery disease)   . B12 deficiency   . Memory loss   . Vertigo   . Diabetes mellitus     type II, poly neuropathy  . Overweight 01/31/2013    Past Surgical History  Procedure Laterality Date  . Coronary angioplasty with stent placement    . Inguinal hernia repair      Family History  Problem Relation Age of Onset  . Coronary artery disease Mother   . Breast cancer Mother   . Diabetes Mother     History   Social History  . Marital Status: Married    Spouse Name: N/A    Number of Children: 1  . Years of Education: N/A   Occupational History  . Retired     [employer]   Social History Main Topics  . Smoking status: Former Smoker -- 1.00 packs/day for 15 years    Types: Cigarettes    Quit date: 07/15/2002  . Smokeless tobacco: Never Used  . Alcohol Use: No  . Drug Use: Not on file  . Sexual Activity: Not on file   Other Topics Concern  . Not on file   Social History Narrative  . No narrative on file    Current Outpatient Prescriptions on File Prior to Visit  Medication Sig Dispense Refill  . albuterol (PROVENTIL HFA;VENTOLIN HFA) 108 (90 BASE) MCG/ACT inhaler Inhale 2 puffs into the  lungs every 6 (six) hours as needed for wheezing.  1 Inhaler  1  . amLODipine (NORVASC) 10 MG tablet Take half tablet by mouth daily  45 tablet  2  . aspirin 81 MG tablet Take 81 mg by mouth daily.        Marland Kitchen atorvastatin (LIPITOR) 80 MG tablet Take one tablet by mouth one time daily  30 tablet  4  . citalopram (CELEXA) 20 MG tablet Take 2 tablets (40 mg total) by mouth daily.  60 tablet  2  . Insulin Pen Needle 32G X 6 MM MISC Use once a day with Levemir  50 each  1  . JANUVIA 50 MG tablet Take one tablet by mouth one time daily  30 tablet  5  . lisinopril (PRINIVIL,ZESTRIL) 10 MG tablet TAKE HALF TABLET BY MOUTH DAILY   45 tablet  1  . mometasone-formoterol (DULERA) 200-5 MCG/ACT AERO Inhale 2 puffs into the lungs 2 (two) times daily.  1 Inhaler  6  . pantoprazole (PROTONIX) 40 MG tablet Take 1 tablet (40 mg total) by mouth daily.  30 tablet  5   No current facility-administered medications on file prior to visit.    Allergies  Allergen Reactions  .  Exenatide     REACTION: nausea    Review of Systems  Review of Systems  Constitutional: Negative for fever and malaise/fatigue.  HENT: Negative for congestion.   Eyes: Negative for discharge.  Respiratory: Negative for shortness of breath.   Cardiovascular: Negative for chest pain, palpitations and leg swelling.  Gastrointestinal: Negative for nausea, abdominal pain and diarrhea.  Genitourinary: Negative for dysuria.  Musculoskeletal: Negative for falls.  Skin: Negative for rash.  Neurological: Negative for loss of consciousness and headaches.  Endo/Heme/Allergies: Negative for polydipsia.  Psychiatric/Behavioral: Negative for depression and suicidal ideas. The patient is not nervous/anxious and does not have insomnia.     Objective  BP 110/68  Pulse 84  Temp(Src) 97.8 F (36.6 C) (Oral)  Ht 5\' 9"  (1.753 m)  Wt 200 lb (90.719 kg)  BMI 29.52 kg/m2  SpO2 94%  Physical Exam  Physical Exam  Constitutional: He is oriented to  person, place, and time and well-developed, well-nourished, and in no distress. No distress.  HENT:  Head: Normocephalic and atraumatic.  Eyes: Conjunctivae are normal.  Neck: Neck supple. No thyromegaly present.  Cardiovascular: Normal rate, regular rhythm and normal heart sounds.   No murmur heard. Pulmonary/Chest: Effort normal and breath sounds normal. No respiratory distress.  Abdominal: He exhibits no distension and no mass. There is no tenderness.  Musculoskeletal: He exhibits no edema.  Neurological: He is alert and oriented to person, place, and time.  Skin: Skin is warm.  Psychiatric: Memory, affect and judgment normal.    Lab Results  Component Value Date   TSH 1.149 07/06/2013   Lab Results  Component Value Date   WBC 8.3 04/27/2013   HGB 14.1 04/27/2013   HCT 42.7 04/27/2013   MCV 90.5 04/27/2013   PLT 163 04/27/2013   Lab Results  Component Value Date   CREATININE 1.73* 07/06/2013   BUN 25* 07/06/2013   NA 137 07/06/2013   K 5.0 07/06/2013   CL 102 07/06/2013   CO2 24 07/06/2013   Lab Results  Component Value Date   ALT 11 07/06/2013   AST 14 07/06/2013   ALKPHOS 130* 07/06/2013   BILITOT 0.8 07/06/2013   Lab Results  Component Value Date   CHOL 182 07/06/2013   Lab Results  Component Value Date   HDL 43 07/06/2013   Lab Results  Component Value Date   LDLCALC 98 07/06/2013   Lab Results  Component Value Date   TRIG 203* 07/06/2013   Lab Results  Component Value Date   CHOLHDL 4.2 07/06/2013     Assessment & Plan  HYPERTENSION Well controlled, no changes to meds. Encouraged heart healthy diet such as the DASH diet and exercise as tolerated.   DIABETES MELLITUS, TYPE II hgba1c elevated, minimize simple carbs. Increase exercise as tolerated. Continue current meds. Patient is now checking his sugars routinely and has his Levemir up to 20 units. His numbers have improved but will have him increase to 22 units. Recheck hgba1c at 3 month  mark. Exercise as tolerated  Overweight Encouraged DASH diet, decrease po intake and increase exercise as tolerated. Needs 7-8 hours of sleep nightly. Avoid trans fats, eat small, frequent meals every 4-5 hours with lean proteins, complex carbs and healthy fats. Minimize simple carbs, GMO foods.

## 2013-09-26 NOTE — Assessment & Plan Note (Signed)
Encouraged DASH diet, decrease po intake and increase exercise as tolerated. Needs 7-8 hours of sleep nightly. Avoid trans fats, eat small, frequent meals every 4-5 hours with lean proteins, complex carbs and healthy fats. Minimize simple carbs, GMO foods. 

## 2013-09-26 NOTE — Assessment & Plan Note (Signed)
hgba1c elevated, minimize simple carbs. Increase exercise as tolerated. Continue current meds. Patient is now checking his sugars routinely and has his Levemir up to 20 units. His numbers have improved but will have him increase to 22 units. Recheck hgba1c at 3 month mark. Exercise as tolerated

## 2013-09-26 NOTE — Assessment & Plan Note (Signed)
Well controlled, no changes to meds. Encouraged heart healthy diet such as the DASH diet and exercise as tolerated.  °

## 2013-10-04 ENCOUNTER — Telehealth: Payer: Self-pay | Admitting: Family Medicine

## 2013-10-04 ENCOUNTER — Ambulatory Visit: Payer: Medicare Other | Admitting: Family Medicine

## 2013-10-04 MED ORDER — SITAGLIPTIN PHOSPHATE 50 MG PO TABS: 50.0000 mg | ORAL_TABLET | Freq: Every day | ORAL | Status: DC

## 2013-10-04 NOTE — Telephone Encounter (Signed)
Refill-januvia  Patient is requesting a 90 day supply

## 2013-10-20 ENCOUNTER — Encounter: Payer: Self-pay | Admitting: Physician Assistant

## 2013-10-20 ENCOUNTER — Ambulatory Visit (INDEPENDENT_AMBULATORY_CARE_PROVIDER_SITE_OTHER): Payer: Medicare HMO | Admitting: Physician Assistant

## 2013-10-20 VITALS — BP 116/70 | HR 79 | Temp 98.1°F | Resp 16 | Ht 69.0 in | Wt 197.8 lb

## 2013-10-20 DIAGNOSIS — H6120 Impacted cerumen, unspecified ear: Secondary | ICD-10-CM

## 2013-10-20 NOTE — Assessment & Plan Note (Signed)
Ear canals irrigated by nursing staff.  Repeat examination yields TM within normal limits bilaterally.  Spent 25 minutes with patient including time for interview, examination, irrigation and repeat examination.

## 2013-10-20 NOTE — Progress Notes (Signed)
Patient presents to clinic today c/o excessive ear wax in both ears noted by his audiologist.  Patient with history of cerumen impaction.  Denies change in hearing, tinnitus or vertigo.  Denies URI symptoms.  Denies other complaint at present.  Past Medical History  Diagnosis Date  . Hypertension   . COPD (chronic obstructive pulmonary disease)   . GERD (gastroesophageal reflux disease)   . Renal insufficiency   . Asthma   . Hyperlipidemia   . CAD (coronary artery disease)   . B12 deficiency   . Memory loss   . Vertigo   . Diabetes mellitus     type II, poly neuropathy  . Overweight 01/31/2013    Current Outpatient Prescriptions on File Prior to Visit  Medication Sig Dispense Refill  . albuterol (PROVENTIL HFA;VENTOLIN HFA) 108 (90 BASE) MCG/ACT inhaler Inhale 2 puffs into the lungs every 6 (six) hours as needed for wheezing.  1 Inhaler  1  . amLODipine (NORVASC) 10 MG tablet Take half tablet by mouth daily  45 tablet  2  . aspirin 81 MG tablet Take 81 mg by mouth daily.        Marland Kitchen atorvastatin (LIPITOR) 80 MG tablet Take one tablet by mouth one time daily  30 tablet  4  . Blood Glucose Monitoring Suppl (ONE TOUCH ULTRA 2) W/DEVICE KIT       . citalopram (CELEXA) 20 MG tablet Take 2 tablets (40 mg total) by mouth daily.  60 tablet  2  . insulin detemir (LEVEMIR) 100 unit/ml SOLN Inject 22 Units into the skin at bedtime.  9 mL  1  . Insulin Pen Needle 32G X 6 MM MISC Use once a day with Levemir  50 each  1  . lisinopril (PRINIVIL,ZESTRIL) 10 MG tablet TAKE HALF TABLET BY MOUTH DAILY   45 tablet  1  . mometasone-formoterol (DULERA) 200-5 MCG/ACT AERO Inhale 2 puffs into the lungs 2 (two) times daily.  1 Inhaler  6  . ONE TOUCH ULTRA TEST test strip       . ONETOUCH DELICA LANCETS 94B MISC       . pantoprazole (PROTONIX) 40 MG tablet Take 1 tablet (40 mg total) by mouth daily.  30 tablet  5  . sitaGLIPtin (JANUVIA) 50 MG tablet Take 1 tablet (50 mg total) by mouth daily.  90 tablet  1    No current facility-administered medications on file prior to visit.    Allergies  Allergen Reactions  . Exenatide     REACTION: nausea    Family History  Problem Relation Age of Onset  . Coronary artery disease Mother   . Breast cancer Mother   . Diabetes Mother     History   Social History  . Marital Status: Married    Spouse Name: N/A    Number of Children: 1  . Years of Education: N/A   Occupational History  . Retired     [employer]   Social History Main Topics  . Smoking status: Former Smoker -- 1.00 packs/day for 15 years    Types: Cigarettes    Quit date: 07/15/2002  . Smokeless tobacco: Never Used  . Alcohol Use: No  . Drug Use: None  . Sexual Activity: None   Other Topics Concern  . None   Social History Narrative  . None   Review of Systems - See HPI.  All other ROS are negative.  BP 116/70  Pulse 79  Temp(Src) 98.1 F (36.7  C) (Oral)  Resp 16  Ht _0  (1.753 m)  Wt 197 lb 12 oz (89.699 kg)  BMI 29.19 kg/m2  SpO2 96%  Physical Exam  Constitutional: He is oriented to person, place, and time and well-developed, well-nourished, and in no distress.  HENT:  Head: Normocephalic and atraumatic.  Left Ear: External ear normal.  Nose: Nose normal.  Mouth/Throat: Oropharynx is clear and moist. No oropharyngeal exudate.  TM initially not visualized 2/2 cerumen impaction bilaterally.  After irrigation TM visible and within normal limits.  Pulmonary/Chest: Effort normal.  Neurological: He is alert and oriented to person, place, and time.  Skin: Skin is warm. No rash noted.  Psychiatric: Affect normal.   Recent Results (from the past 2160 hour(s))  GLUCOSE, POCT (MANUAL RESULT ENTRY)     Status: Abnormal   Collection Time    08/16/13  3:20 PM      Result Value Ref Range   POC Glucose 204 (*) 70 - 99 mg/dl  HM DIABETES EYE EXAM     Status: None   Collection Time    08/25/13 12:00 AM      Result Value Ref Range   HM Diabetic Eye Exam No  Retinopathy  No Retinopathy   Comment: [facility]     Assessment/Plan: Cerumen impaction Ear canals irrigated by nursing staff.  Repeat examination yields TM within normal limits bilaterally.  Spent 25 minutes with patient including time for interview, examination, irrigation and repeat examination.

## 2013-10-20 NOTE — Patient Instructions (Signed)
If you begin developing excessive ear wax, place some hydrogen peroxide in your ears and let it sit for a few minutes, then lean over and let the ear drain.  This will soften the ear wax and help it get out of the ear canal on its own.  Consider getting an ear wax removal kit from your local pharmacy.  Avoid Q-tip use as this can cause ear wax impaction and rupture of your ear drum.  Follow-up with Dr. Charlett Blake as scheduled.  Cerumen Impaction A cerumen impaction is when the wax in your ear forms a plug. This plug usually causes reduced hearing. Sometimes it also causes an earache or dizziness. Removing a cerumen impaction can be difficult and painful. The wax sticks to the ear canal. The canal is sensitive and bleeds easily. If you try to remove a heavy wax buildup with a cotton tipped swab, you may push it in further. Irrigation with water, suction, and small ear curettes may be used to clear out the wax. If the impaction is fixed to the skin in the ear canal, ear drops may be needed for a few days to loosen the wax. People who build up a lot of wax frequently can use ear wax removal products available in your local drugstore. SEEK MEDICAL CARE IF:  You develop an earache, increased hearing loss, or marked dizziness. Document Released: 08/08/2004 Document Revised: 09/23/2011 Document Reviewed: 09/28/2009 [facility] Patient Information 2014 [place], [place].

## 2013-10-21 LAB — LIPID PANEL
Cholesterol: 169 mg/dL (ref 0–200)
HDL: 39 mg/dL — ABNORMAL LOW (ref >39)
LDL Cholesterol: 99 mg/dL (ref 0–99)
Total CHOL/HDL Ratio: 4.3 Ratio
Triglycerides: 155 mg/dL — ABNORMAL HIGH (ref <150)
VLDL: 31 mg/dL (ref 0–40)

## 2013-10-21 LAB — TSH: TSH: 1.027 u[IU]/mL (ref 0.350–4.500)

## 2013-10-21 LAB — HEPATIC FUNCTION PANEL
ALT: 10 U/L (ref 0–53)
AST: 13 U/L (ref 0–37)
Albumin: 3.4 g/dL — ABNORMAL LOW (ref 3.5–5.2)
Alkaline Phosphatase: 112 U/L (ref 39–117)
Bilirubin, Direct: 0.1 mg/dL (ref 0.0–0.3)
Indirect Bilirubin: 0.7 mg/dL (ref 0.2–1.2)
Total Bilirubin: 0.8 mg/dL (ref 0.2–1.2)
Total Protein: 6.3 g/dL (ref 6.0–8.3)

## 2013-10-21 LAB — RENAL FUNCTION PANEL
Albumin: 3.4 g/dL — ABNORMAL LOW (ref 3.5–5.2)
BUN: 22 mg/dL (ref 6–23)
CO2: 27 mEq/L (ref 19–32)
Calcium: 8.8 mg/dL (ref 8.4–10.5)
Chloride: 102 mEq/L (ref 96–112)
Creat: 1.63 mg/dL — ABNORMAL HIGH (ref 0.50–1.35)
Glucose, Bld: 166 mg/dL — ABNORMAL HIGH (ref 70–99)
Phosphorus: 2.9 mg/dL (ref 2.3–4.6)
Potassium: 4.7 mEq/L (ref 3.5–5.3)
Sodium: 138 mEq/L (ref 135–145)

## 2013-10-21 LAB — CBC
HCT: 41.5 % (ref 39.0–52.0)
Hemoglobin: 13.6 g/dL (ref 13.0–17.0)
MCH: 29.6 pg (ref 26.0–34.0)
MCHC: 32.8 g/dL (ref 30.0–36.0)
MCV: 90.4 fL (ref 78.0–100.0)
Platelets: 82 10*3/uL — ABNORMAL LOW (ref 150–400)
RBC: 4.59 MIL/uL (ref 4.22–5.81)
RDW: 14.6 % (ref 11.5–15.5)
WBC: 8.5 10*3/uL (ref 4.0–10.5)

## 2013-10-21 LAB — HEMOGLOBIN A1C
Hgb A1c MFr Bld: 8.9 % — ABNORMAL HIGH (ref <5.7)
Mean Plasma Glucose: 209 mg/dL — ABNORMAL HIGH (ref <117)

## 2013-10-26 ENCOUNTER — Encounter: Payer: Self-pay | Admitting: Family Medicine

## 2013-10-26 ENCOUNTER — Ambulatory Visit (HOSPITAL_BASED_OUTPATIENT_CLINIC_OR_DEPARTMENT_OTHER)
Admission: RE | Admit: 2013-10-26 | Discharge: 2013-10-26 | Disposition: A | Discharge status: Home / Self Care | Payer: Medicare HMO | Source: Ambulatory Visit | Attending: Family Medicine | Admitting: Family Medicine

## 2013-10-26 ENCOUNTER — Ambulatory Visit (INDEPENDENT_AMBULATORY_CARE_PROVIDER_SITE_OTHER): Payer: Medicare HMO | Admitting: Family Medicine

## 2013-10-26 VITALS — BP 102/80 | HR 84 | Temp 98.1°F | Ht 69.0 in | Wt 196.0 lb

## 2013-10-26 DIAGNOSIS — M169 Osteoarthritis of hip, unspecified: Secondary | ICD-10-CM | POA: Insufficient documentation

## 2013-10-26 DIAGNOSIS — M25559 Pain in unspecified hip: Secondary | ICD-10-CM

## 2013-10-26 DIAGNOSIS — M79604 Pain in right leg: Secondary | ICD-10-CM

## 2013-10-26 DIAGNOSIS — E785 Hyperlipidemia, unspecified: Secondary | ICD-10-CM

## 2013-10-26 DIAGNOSIS — J449 Chronic obstructive pulmonary disease, unspecified: Secondary | ICD-10-CM

## 2013-10-26 DIAGNOSIS — M1610 Unilateral primary osteoarthritis, unspecified hip: Secondary | ICD-10-CM | POA: Insufficient documentation

## 2013-10-26 DIAGNOSIS — I10 Essential (primary) hypertension: Secondary | ICD-10-CM

## 2013-10-26 DIAGNOSIS — M25551 Pain in right hip: Secondary | ICD-10-CM

## 2013-10-26 DIAGNOSIS — M79609 Pain in unspecified limb: Secondary | ICD-10-CM

## 2013-10-26 DIAGNOSIS — J4489 Other specified chronic obstructive pulmonary disease: Secondary | ICD-10-CM

## 2013-10-26 DIAGNOSIS — E119 Type 2 diabetes mellitus without complications: Secondary | ICD-10-CM

## 2013-10-26 NOTE — Progress Notes (Signed)
Pre visit review using our clinic review tool, if applicable. No additional management support is needed unless otherwise documented below in the visit note. 

## 2013-10-26 NOTE — Patient Instructions (Signed)
Can increase Levemir by 2 units every 3 days if no blood sugar numbers are below 100. So 24 units starting Saturday, 26 units starting Tuesday, 28 units starting Friday, etc til numbers start to approach 100 and no numbers are above 200    Basic Carbohydrate Counting Basic carbohydrate counting is a way to plan meals. It is done by counting the amount of carbohydrate in foods. Foods that have carbohydrates are starches (grains, beans, starchy vegetables) and sweets. Eating carbohydrates increases blood glucose (sugar) levels. People with diabetes use carbohydrate counting to help keep their blood glucose at a normal level.  COUNTING CARBOHYDRATES IN FOODS The first step in counting carbohydrates is to learn how many carbohydrate servings you should have in every meal. A dietitian can plan this for you. After learning the amount of carbohydrates to include in your meal plan, you can start to choose the carbohydrate-containing foods you want to eat.  There are 2 ways to identify the amount of carbohydrates in the foods you eat.  Read the Nutrition Facts panel on food labels. You need 2 pieces of information from the Nutrition Facts panel to count carbohydrates this way:  Serving size.  Total carbohydrate (in grams). Decide how many servings you will be eating. If it is 1 serving, you will be eating the amount of carbohydrate listed on the panel. If you will be eating 2 servings, you will be eating double the amount of carbohydrate listed on the panel.   Learn serving sizes. A serving size of most carbohydrate-containing foods is about 15 grams (g). Listed below are single serving sizes of common carbohydrate-containing foods:  1 slice bread.   cup unsweetened, dry cereal.   cup hot cereal.   cup rice.   cup mashed potatoes.   cup pasta.  1 cup fresh fruit.   cup canned fruit.  1 cup milk (whole, 2%, or skim).   cup starchy vegetables (peas, corn, or potatoes). Counting  carbohydrates this way is similar to looking on the Nutrition Facts panel. Decide how many servings you will eat first. Multiply the number of servings you eat by 15 g. For example, if you have 2 cups of strawberries, you had 2 servings. That means you had 30 g of carbohydrate (2 servings x 15 g = 30 g). CALCULATING CARBOHYDRATES IN A MEAL Sample dinner  3 oz chicken breast.   cup brown rice.   cup corn.  1 cup fat-free milk.  1 cup strawberries with sugar-free whipped topping. Carbohydrate calculation First, identify the foods that contain carbohydrate:  Rice.  Corn.  Milk.  Strawberries. Calculate the number of servings eaten:  2 servings rice.  1 serving corn.  1 serving milk.  1 serving strawberries. Multiply the number of servings by 15 g:  2 servings rice x 15 g = 30 g.  1 serving corn x 15 g = 15 g.  1 serving milk x 15 g = 15 g.  1 serving strawberries x 15 g = 15 g. Add the amounts to find the total carbohydrates eaten: 30 g + 15 g + 15 g + 15 g = 75 g carbohydrate eaten at dinner. Document Released: 07/01/2005 Document Revised: 09/23/2011 Document Reviewed: 05/17/2011 [facility] Patient Information 2014 [place], [place].

## 2013-10-31 ENCOUNTER — Encounter: Payer: Self-pay | Admitting: Family Medicine

## 2013-10-31 DIAGNOSIS — M25551 Pain in right hip: Secondary | ICD-10-CM | POA: Insufficient documentation

## 2013-10-31 NOTE — Assessment & Plan Note (Signed)
Doing much better. A1C down by 1 point will continue to have him titrate up his levemir and minimize carbs

## 2013-10-31 NOTE — Assessment & Plan Note (Signed)
Improved, no changes today, encouraged to minimize carbs to improve triglycerides

## 2013-10-31 NOTE — Assessment & Plan Note (Signed)
Denies CP/palp/SOB/HA/congestion/fevers/GI or GU c/o. Taking meds as prescribed

## 2013-10-31 NOTE — Assessment & Plan Note (Signed)
Symptoms stable. No changes

## 2013-10-31 NOTE — Progress Notes (Signed)
Patient ID: Gregory Farrell, male   DOB: Feb 01, 1933, 80 y.o.   MRN: [id number] Gregory Farrell [id number] [birth year] 10/31/2013      Progress Note-Follow Up  Subjective  Chief Complaint  Chief Complaint  Patient presents with  . Follow-up    5 week    HPI  Patient is a 80 year old male in today for routine medical care. He notes his sugars have been improving. No polyuria or polydipsia. Is taking medications routinely. His greatest complaint is of increasing right hip pain. He notes right leg weakness and radicular pain I also noted. He reports roughly 5 years ago he had a hairline fracture of his right hip. He has been using lidocaine patches with minimal results. No recent falls or trauma. No numbness or tingling in the leg. No incontinence. He is complaining disequilibrium and that is ongoing x5 years and no change. No hearing changes or tinnitus. No other neurologic complaints. Denies CP/palp/SOB/HA/congestion/fevers/GI or GU c/o. Taking meds as prescribed  Past Medical History  Diagnosis Date  . Hypertension   . COPD (chronic obstructive pulmonary disease)   . GERD (gastroesophageal reflux disease)   . Renal insufficiency   . Asthma   . Hyperlipidemia   . CAD (coronary artery disease)   . B12 deficiency   . Memory loss   . Vertigo   . Diabetes mellitus     type II, poly neuropathy  . Overweight 01/31/2013    Past Surgical History  Procedure Laterality Date  . Coronary angioplasty with stent placement    . Inguinal hernia repair      Family History  Problem Relation Age of Onset  . Coronary artery disease Mother   . Breast cancer Mother   . Diabetes Mother     History   Social History  . Marital Status: Married    Spouse Name: N/A    Number of Children: 1  . Years of Education: N/A   Occupational History  . Retired     [employer]   Social History Main Topics  . Smoking status: Former Smoker -- 1.00 packs/day for 15 years    Types: Cigarettes    Quit date:  07/15/2002  . Smokeless tobacco: Never Used  . Alcohol Use: No  . Drug Use: Not on file  . Sexual Activity: Not on file   Other Topics Concern  . Not on file   Social History Narrative  . No narrative on file    Current Outpatient Prescriptions on File Prior to Visit  Medication Sig Dispense Refill  . albuterol (PROVENTIL HFA;VENTOLIN HFA) 108 (90 BASE) MCG/ACT inhaler Inhale 2 puffs into the lungs every 6 (six) hours as needed for wheezing.  1 Inhaler  1  . amLODipine (NORVASC) 10 MG tablet Take half tablet by mouth daily  45 tablet  2  . aspirin 81 MG tablet Take 81 mg by mouth daily.        Marland Kitchen atorvastatin (LIPITOR) 80 MG tablet Take one tablet by mouth one time daily  30 tablet  4  . Blood Glucose Monitoring Suppl (ONE TOUCH ULTRA 2) W/DEVICE KIT       . citalopram (CELEXA) 20 MG tablet Take 2 tablets (40 mg total) by mouth daily.  60 tablet  2  . insulin detemir (LEVEMIR) 100 unit/ml SOLN Inject 22 Units into the skin at bedtime.  9 mL  1  . Insulin Pen Needle 32G X 6 MM MISC Use once a day with  Levemir  50 each  1  . lisinopril (PRINIVIL,ZESTRIL) 10 MG tablet TAKE HALF TABLET BY MOUTH DAILY   45 tablet  1  . mometasone-formoterol (DULERA) 200-5 MCG/ACT AERO Inhale 2 puffs into the lungs 2 (two) times daily.  1 Inhaler  6  . ONE TOUCH ULTRA TEST test strip       . ONETOUCH DELICA LANCETS 44R MISC       . pantoprazole (PROTONIX) 40 MG tablet Take 1 tablet (40 mg total) by mouth daily.  30 tablet  5  . sitaGLIPtin (JANUVIA) 50 MG tablet Take 1 tablet (50 mg total) by mouth daily.  90 tablet  1   No current facility-administered medications on file prior to visit.    Allergies  Allergen Reactions  . Exenatide     REACTION: nausea    Review of Systems  Review of Systems  Constitutional: Negative for fever and malaise/fatigue.  HENT: Negative for congestion.   Eyes: Negative for discharge.  Respiratory: Negative for shortness of breath.   Cardiovascular: Negative for  chest pain, palpitations and leg swelling.  Gastrointestinal: Negative for nausea, abdominal pain and diarrhea.  Genitourinary: Negative for dysuria.  Musculoskeletal: Positive for joint pain. Negative for falls.       Right  Hip, pain down leg  Skin: Negative for rash.  Neurological: Negative for loss of consciousness and headaches.  Endo/Heme/Allergies: Negative for polydipsia.  Psychiatric/Behavioral: Negative for depression and suicidal ideas. The patient is not nervous/anxious and does not have insomnia.     Objective  BP 102/80  Pulse 84  Temp(Src) 98.1 F (36.7 C) (Oral)  Ht 5' 9"  (1.753 m)  Wt 196 lb 0.6 oz (88.923 kg)  BMI 28.94 kg/m2  SpO2 96%  Physical Exam  Physical Exam  Constitutional: He is oriented to person, place, and time and well-developed, well-nourished, and in no distress. No distress.  HENT:  Head: Normocephalic and atraumatic.  Eyes: Conjunctivae are normal.  Neck: Neck supple. No thyromegaly present.  Cardiovascular: Normal rate, regular rhythm and normal heart sounds.   Pulmonary/Chest: Effort normal and breath sounds normal. No respiratory distress.  Abdominal: He exhibits no distension and no mass. There is no tenderness.  Musculoskeletal: He exhibits no edema.  Neurological: He is alert and oriented to person, place, and time.  Skin: Skin is warm.  Psychiatric: Memory, affect and judgment normal.    Lab Results  Component Value Date   TSH 1.027 10/20/2013   Lab Results  Component Value Date   WBC 8.5 10/20/2013   HGB 13.6 10/20/2013   HCT 41.5 10/20/2013   MCV 90.4 10/20/2013   PLT 82* 10/20/2013   Lab Results  Component Value Date   CREATININE 1.63* 10/20/2013   BUN 22 10/20/2013   NA 138 10/20/2013   K 4.7 10/20/2013   CL 102 10/20/2013   CO2 27 10/20/2013   Lab Results  Component Value Date   ALT 10 10/20/2013   AST 13 10/20/2013   ALKPHOS 112 10/20/2013   BILITOT 0.8 10/20/2013   Lab Results  Component Value Date   CHOL 169 10/20/2013   Lab  Results  Component Value Date   HDL 39* 10/20/2013   Lab Results  Component Value Date   LDLCALC 99 10/20/2013   Lab Results  Component Value Date   TRIG 155* 10/20/2013   Lab Results  Component Value Date   CHOLHDL 4.3 10/20/2013     Assessment & Plan  HYPERTENSION Denies CP/palp/SOB/HA/congestion/fevers/GI or GU  c/o. Taking meds as prescribed  DIABETES MELLITUS, TYPE II Doing much better. A1C down by 1 point will continue to have him titrate up his levemir and minimize carbs  COPD gold stage C. Symptoms stable. No changes  Hip pain, right Xray of hip today negative for fracture but will refer to ortho for further consideration due to the persistent and radicular symptoms associated with hip pain  HYPERLIPIDEMIA Improved, no changes today, encouraged to minimize carbs to improve triglycerides

## 2013-10-31 NOTE — Assessment & Plan Note (Signed)
Xray of hip today negative for fracture but will refer to ortho for further consideration due to the persistent and radicular symptoms associated with hip pain

## 2013-11-01 ENCOUNTER — Telehealth: Payer: Self-pay | Admitting: *Deleted

## 2013-11-01 DIAGNOSIS — R911 Solitary pulmonary nodule: Secondary | ICD-10-CM

## 2013-11-01 NOTE — Telephone Encounter (Signed)
Spoke with pt, scheduled for chest CT wed this week to follow up lung nodule. Patient voiced understanding

## 2013-11-03 ENCOUNTER — Other Ambulatory Visit: Payer: Medicare HMO

## 2013-11-20 ENCOUNTER — Other Ambulatory Visit: Payer: Self-pay | Admitting: Family Medicine

## 2013-12-02 ENCOUNTER — Telehealth: Payer: Self-pay | Admitting: Family Medicine

## 2013-12-02 ENCOUNTER — Encounter: Payer: Self-pay | Admitting: Family Medicine

## 2013-12-02 ENCOUNTER — Ambulatory Visit (INDEPENDENT_AMBULATORY_CARE_PROVIDER_SITE_OTHER): Payer: Medicare HMO | Admitting: Family Medicine

## 2013-12-02 VITALS — BP 142/84 | HR 89 | Temp 98.0°F | Resp 18 | Ht 69.0 in | Wt 197.0 lb

## 2013-12-02 DIAGNOSIS — E119 Type 2 diabetes mellitus without complications: Secondary | ICD-10-CM

## 2013-12-02 DIAGNOSIS — E663 Overweight: Secondary | ICD-10-CM

## 2013-12-02 DIAGNOSIS — I10 Essential (primary) hypertension: Secondary | ICD-10-CM

## 2013-12-02 DIAGNOSIS — E785 Hyperlipidemia, unspecified: Secondary | ICD-10-CM

## 2013-12-02 MED ORDER — INSULIN DETEMIR 100 UNIT/ML FLEXPEN: 50.0000 [IU] | PEN_INJECTOR | Freq: Every morning | SUBCUTANEOUS | Status: DC

## 2013-12-02 NOTE — Progress Notes (Signed)
Pre visit review using our clinic review tool, if applicable. No additional management support is needed unless otherwise documented below in the visit note. 

## 2013-12-02 NOTE — Patient Instructions (Signed)

## 2013-12-02 NOTE — Telephone Encounter (Signed)
Lab order last weekReturn in about 10 weeks (around 02/10/2014) for lipid, renal, cbc, tsh, hepatic, hgba1c prior.  of July

## 2013-12-06 ENCOUNTER — Encounter: Payer: Self-pay | Admitting: Family Medicine

## 2013-12-06 NOTE — Assessment & Plan Note (Signed)
niumbers improving as he has titrated up his Insulin. Unfortunately he was running low on his Levemir so he dropped back to 25 units daily and his numbers went up again. Will have him increase his Levemir to 40 units and keep increasing by 2 units every 3 days as directed previously, has still not seen any numbers below 100.

## 2013-12-06 NOTE — Assessment & Plan Note (Signed)
Encouraged DASH diet, decrease po intake and increase exercise as tolerated. Needs 7-8 hours of sleep nightly. Avoid trans fats, eat small, frequent meals every 4-5 hours with lean proteins, complex carbs and healthy fats. Minimize simple carbs, GMO foods. 

## 2013-12-06 NOTE — Assessment & Plan Note (Signed)
Tolerating statin, encouraged heart healthy diet, avoid trans fats, minimize simple carbs and saturated fats. Increase exercise as tolerated 

## 2013-12-06 NOTE — Assessment & Plan Note (Signed)
Well controlled, no changes to meds. Encouraged heart healthy diet such as the DASH diet and exercise as tolerated.  °

## 2013-12-06 NOTE — Progress Notes (Signed)
Patient ID: Gregory Farrell, male   DOB: 07-21-1932, 80 y.o.   MRN: [id number] Gregory Farrell [id number] 22-Sep-1932 12/06/2013      Progress Note-Follow Up  Subjective  Chief Complaint  Chief Complaint  Patient presents with  . Follow-up    5 weeks, no concerns    HPI .  Patient is a 80 year old male in today for routine medical care. On his diabetes. He had titrated his Levemir to 36 units as directed and he noticed he was feeling better his numbers are starting to improve. He still has not seen any numbers under 100. He began to low on his Levemir so he got back to 25 units and is now seeing numbers in the 200s again. No polyuria or polydipsia. Has been seen by orthopedics, Arlee Muslim, PA and his hip is feeling somewhat better. Denies CP/palp/SOB/HA/congestion/fevers/GI or GU c/o. Taking meds as prescribed  Past Medical History  Diagnosis Date  . Hypertension   . COPD (chronic obstructive pulmonary disease)   . GERD (gastroesophageal reflux disease)   . Renal insufficiency   . Asthma   . Hyperlipidemia   . CAD (coronary artery disease)   . B12 deficiency   . Memory loss   . Vertigo   . Diabetes mellitus     type II, poly neuropathy  . Overweight 01/31/2013    Past Surgical History  Procedure Laterality Date  . Coronary angioplasty with stent placement    . Inguinal hernia repair      Family History  Problem Relation Age of Onset  . Coronary artery disease Mother   . Breast cancer Mother   . Diabetes Mother     History   Social History  . Marital Status: Married    Spouse Name: N/A    Number of Children: 1  . Years of Education: N/A   Occupational History  . Retired     [employer]   Social History Main Topics  . Smoking status: Former Smoker -- 1.00 packs/day for 15 years    Types: Cigarettes    Quit date: 07/15/2002  . Smokeless tobacco: Never Used  . Alcohol Use: No  . Drug Use: Not on file  . Sexual Activity: Not on file   Other Topics Concern  . Not  on file   Social History Narrative  . No narrative on file    Current Outpatient Prescriptions on File Prior to Visit  Medication Sig Dispense Refill  . albuterol (PROVENTIL HFA;VENTOLIN HFA) 108 (90 BASE) MCG/ACT inhaler Inhale 2 puffs into the lungs every 6 (six) hours as needed for wheezing.  1 Inhaler  1  . amLODipine (NORVASC) 10 MG tablet Take half tablet by mouth daily  45 tablet  2  . aspirin 81 MG tablet Take 81 mg by mouth daily.        Marland Kitchen atorvastatin (LIPITOR) 80 MG tablet Take one tablet by mouth one time daily  30 tablet  4  . Blood Glucose Monitoring Suppl (ONE TOUCH ULTRA 2) W/DEVICE KIT       . citalopram (CELEXA) 20 MG tablet Take 2 tablets (40 mg total) by mouth daily.  60 tablet  2  . Insulin Pen Needle 32G X 6 MM MISC Use once a day with Levemir  50 each  1  . lisinopril (PRINIVIL,ZESTRIL) 10 MG tablet TAKE HALF TABLET BY MOUTH DAILY   45 tablet  1  . mometasone-formoterol (DULERA) 200-5 MCG/ACT AERO Inhale 2 puffs into  the lungs 2 (two) times daily.  1 Inhaler  6  . ONE TOUCH ULTRA TEST test strip       . ONETOUCH DELICA LANCETS 62I MISC       . pantoprazole (PROTONIX) 40 MG tablet Take 1 tablet (40 mg total) by mouth daily.  30 tablet  5  . sitaGLIPtin (JANUVIA) 50 MG tablet Take 1 tablet (50 mg total) by mouth daily.  90 tablet  1   No current facility-administered medications on file prior to visit.    Allergies  Allergen Reactions  . Exenatide     REACTION: nausea    Review of Systems  Review of Systems  Constitutional: Negative for fever and malaise/fatigue.  HENT: Negative for congestion.   Eyes: Negative for discharge.  Respiratory: Negative for shortness of breath.   Cardiovascular: Negative for chest pain, palpitations and leg swelling.  Gastrointestinal: Negative for nausea, abdominal pain and diarrhea.  Genitourinary: Negative for dysuria.  Musculoskeletal: Negative for falls.  Skin: Negative for rash.  Neurological: Negative for loss of  consciousness and headaches.  Endo/Heme/Allergies: Negative for polydipsia.  Psychiatric/Behavioral: Negative for depression and suicidal ideas. The patient is not nervous/anxious and does not have insomnia.     Objective  BP 142/84  Pulse 89  Temp(Src) 98 F (36.7 C) (Oral)  Resp 18  Ht _0  (1.753 m)  Wt 197 lb (89.359 kg)  BMI 29.08 kg/m2  SpO2 93%  Physical Exam  Physical Exam  Constitutional: He is oriented to person, place, and time and well-developed, well-nourished, and in no distress. No distress.  HENT:  Head: Normocephalic and atraumatic.  Eyes: Conjunctivae are normal.  Neck: Neck supple. No thyromegaly present.  Cardiovascular: Normal rate, regular rhythm and normal heart sounds.   No murmur heard. Pulmonary/Chest: Effort normal and breath sounds normal. No respiratory distress.  Abdominal: He exhibits no distension and no mass. There is no tenderness.  Musculoskeletal: He exhibits no edema.  Neurological: He is alert and oriented to person, place, and time.  Skin: Skin is warm.  Psychiatric: Memory, affect and judgment normal.    Lab Results  Component Value Date   TSH 1.027 10/20/2013   Lab Results  Component Value Date   WBC 8.5 10/20/2013   HGB 13.6 10/20/2013   HCT 41.5 10/20/2013   MCV 90.4 10/20/2013   PLT 82* 10/20/2013   Lab Results  Component Value Date   CREATININE 1.63* 10/20/2013   BUN 22 10/20/2013   NA 138 10/20/2013   K 4.7 10/20/2013   CL 102 10/20/2013   CO2 27 10/20/2013   Lab Results  Component Value Date   ALT 10 10/20/2013   AST 13 10/20/2013   ALKPHOS 112 10/20/2013   BILITOT 0.8 10/20/2013   Lab Results  Component Value Date   CHOL 169 10/20/2013   Lab Results  Component Value Date   HDL 39* 10/20/2013   Lab Results  Component Value Date   LDLCALC 99 10/20/2013   Lab Results  Component Value Date   TRIG 155* 10/20/2013   Lab Results  Component Value Date   CHOLHDL 4.3 10/20/2013     Assessment & Plan  DIABETES MELLITUS, TYPE  II niumbers improving as he has titrated up his Insulin. Unfortunately he was running low on his Levemir so he dropped back to 25 units daily and his numbers went up again. Will have him increase his Levemir to 40 units and keep increasing by 2 units every 3 days  as directed previously, has still not seen any numbers below 100.  Overweight Encouraged DASH diet, decrease po intake and increase exercise as tolerated. Needs 7-8 hours of sleep nightly. Avoid trans fats, eat small, frequent meals every 4-5 hours with lean proteins, complex carbs and healthy fats. Minimize simple carbs, GMO foods.  HYPERLIPIDEMIA Tolerating statin, encouraged heart healthy diet, avoid trans fats, minimize simple carbs and saturated fats. Increase exercise as tolerated  HYPERTENSION Well controlled, no changes to meds. Encouraged heart healthy diet such as the DASH diet and exercise as tolerated.

## 2013-12-08 ENCOUNTER — Other Ambulatory Visit: Payer: Self-pay | Admitting: Family Medicine

## 2013-12-09 ENCOUNTER — Telehealth: Payer: Self-pay | Admitting: Family Medicine

## 2013-12-09 NOTE — Telephone Encounter (Signed)
Received medical records from [facility]

## 2013-12-22 ENCOUNTER — Other Ambulatory Visit: Payer: Self-pay | Admitting: Family Medicine

## 2014-01-10 ENCOUNTER — Other Ambulatory Visit: Payer: Self-pay | Admitting: Family Medicine

## 2014-02-11 LAB — RENAL FUNCTION PANEL
ALBUMIN: 3.5 g/dL (ref 3.5–5.2)
BUN: 19 mg/dL (ref 6–23)
CO2: 27 meq/L (ref 19–32)
Calcium: 9.4 mg/dL (ref 8.4–10.5)
Chloride: 103 mEq/L (ref 96–112)
Creat: 1.8 mg/dL — ABNORMAL HIGH (ref 0.50–1.35)
Glucose, Bld: 136 mg/dL — ABNORMAL HIGH (ref 70–99)
POTASSIUM: 4.9 meq/L (ref 3.5–5.3)
Phosphorus: 3.2 mg/dL (ref 2.3–4.6)
SODIUM: 139 meq/L (ref 135–145)

## 2014-02-11 LAB — HEPATIC FUNCTION PANEL
ALT: 9 U/L (ref 0–53)
AST: 12 U/L (ref 0–37)
Albumin: 3.5 g/dL (ref 3.5–5.2)
Alkaline Phosphatase: 115 U/L (ref 39–117)
BILIRUBIN DIRECT: 0.1 mg/dL (ref 0.0–0.3)
BILIRUBIN INDIRECT: 0.6 mg/dL (ref 0.2–1.2)
Total Bilirubin: 0.7 mg/dL (ref 0.2–1.2)
Total Protein: 6.6 g/dL (ref 6.0–8.3)

## 2014-02-11 LAB — LIPID PANEL
CHOL/HDL RATIO: 4.3 ratio
Cholesterol: 159 mg/dL (ref 0–200)
HDL: 37 mg/dL — AB (ref >39)
LDL Cholesterol: 81 mg/dL (ref 0–99)
Triglycerides: 203 mg/dL — ABNORMAL HIGH (ref <150)
VLDL: 41 mg/dL — ABNORMAL HIGH (ref 0–40)

## 2014-02-11 LAB — TSH: TSH: 1.684 u[IU]/mL (ref 0.350–4.500)

## 2014-02-12 LAB — CBC
HCT: 41.6 % (ref 39.0–52.0)
Hemoglobin: 13.7 g/dL (ref 13.0–17.0)
MCH: 29.3 pg (ref 26.0–34.0)
MCHC: 32.9 g/dL (ref 30.0–36.0)
MCV: 88.9 fL (ref 78.0–100.0)
RBC: 4.68 MIL/uL (ref 4.22–5.81)
RDW: 15.1 % (ref 11.5–15.5)
WBC: 8.1 10*3/uL (ref 4.0–10.5)

## 2014-02-12 LAB — HEMOGLOBIN A1C
Hgb A1c MFr Bld: 8.8 % — ABNORMAL HIGH (ref <5.7)
Mean Plasma Glucose: 206 mg/dL — ABNORMAL HIGH (ref <117)

## 2014-02-15 ENCOUNTER — Ambulatory Visit (INDEPENDENT_AMBULATORY_CARE_PROVIDER_SITE_OTHER): Payer: Medicare HMO | Admitting: Family Medicine

## 2014-02-15 ENCOUNTER — Encounter: Payer: Self-pay | Admitting: Family Medicine

## 2014-02-15 VITALS — BP 108/68 | HR 84 | Temp 98.0°F | Ht 69.0 in | Wt 196.1 lb

## 2014-02-15 DIAGNOSIS — R0602 Shortness of breath: Secondary | ICD-10-CM

## 2014-02-15 DIAGNOSIS — R911 Solitary pulmonary nodule: Secondary | ICD-10-CM

## 2014-02-15 DIAGNOSIS — E119 Type 2 diabetes mellitus without complications: Secondary | ICD-10-CM

## 2014-02-15 DIAGNOSIS — R0989 Other specified symptoms and signs involving the circulatory and respiratory systems: Secondary | ICD-10-CM

## 2014-02-15 DIAGNOSIS — E1129 Type 2 diabetes mellitus with other diabetic kidney complication: Secondary | ICD-10-CM

## 2014-02-15 DIAGNOSIS — E1122 Type 2 diabetes mellitus with diabetic chronic kidney disease: Secondary | ICD-10-CM

## 2014-02-15 DIAGNOSIS — R0609 Other forms of dyspnea: Secondary | ICD-10-CM

## 2014-02-15 DIAGNOSIS — N189 Chronic kidney disease, unspecified: Secondary | ICD-10-CM

## 2014-02-15 DIAGNOSIS — E785 Hyperlipidemia, unspecified: Secondary | ICD-10-CM

## 2014-02-15 DIAGNOSIS — I10 Essential (primary) hypertension: Secondary | ICD-10-CM

## 2014-02-15 MED ORDER — INSULIN DETEMIR 100 UNIT/ML FLEXPEN: 60.0000 [IU] | PEN_INJECTOR | Freq: Every morning | SUBCUTANEOUS | Status: DC

## 2014-02-15 MED ORDER — LISINOPRIL 5 MG PO TABS: 2.5000 mg | ORAL_TABLET | Freq: Every day | ORAL | Status: DC

## 2014-02-15 NOTE — Patient Instructions (Signed)
Keep albuterol with you. Increase Levemir to 54 units today in 3 days if no numbers below 110 increase to 56 units then let me know numbers next week for further adjustment. Stop Januvia Chronic Obstructive Pulmonary Disease Chronic obstructive pulmonary disease (COPD) is a common lung condition in which airflow from the lungs is limited. COPD is a general term that can be used to describe many different lung problems that limit airflow, including both chronic bronchitis and emphysema. If you have COPD, your lung function will probably never return to normal, but there are measures you can take to improve lung function and make yourself feel better.  CAUSES   Smoking (common).   Exposure to secondhand smoke.   Genetic problems.  Chronic inflammatory lung diseases or recurrent infections. SYMPTOMS   Shortness of breath, especially with physical activity.   Deep, persistent (chronic) cough with a large amount of thick mucus.   Wheezing.   Rapid breaths (tachypnea).   Gray or bluish discoloration (cyanosis) of the skin, especially in fingers, toes, or lips.   Fatigue.   Weight loss.   Frequent infections or episodes when breathing symptoms become much worse (exacerbations).   Chest tightness. DIAGNOSIS  Your health care provider will take a medical history and perform a physical examination to make the initial diagnosis. Additional tests for COPD may include:   Lung (pulmonary) function tests.  Chest X-ray.  CT scan.  Blood tests. TREATMENT  Treatment available to help you feel better when you have COPD includes:   Inhaler and nebulizer medicines. These help manage the symptoms of COPD and make your breathing more comfortable.  Supplemental oxygen. Supplemental oxygen is only helpful if you have a low oxygen level in your blood.   Exercise and physical activity. These are beneficial for nearly all people with COPD. Some people may also benefit from a  pulmonary rehabilitation program. HOME CARE INSTRUCTIONS   Take all medicines (inhaled or pills) as directed by your health care provider.  Avoid over-the-counter medicines or cough syrups that dry up your airway (such as antihistamines) and slow down the elimination of secretions unless instructed otherwise by your health care provider.   If you are a smoker, the most important thing that you can do is stop smoking. Continuing to smoke will cause further lung damage and breathing trouble. Ask your health care provider for help with quitting smoking. He or she can direct you to community resources or hospitals that provide support.  Avoid exposure to irritants such as smoke, chemicals, and fumes that aggravate your breathing.  Use oxygen therapy and pulmonary rehabilitation if directed by your health care provider. If you require home oxygen therapy, ask your health care provider whether you should purchase a pulse oximeter to measure your oxygen level at home.   Avoid contact with individuals who have a contagious illness.  Avoid extreme temperature and humidity changes.  Eat healthy foods. Eating smaller, more frequent meals and resting before meals may help you maintain your strength.  Stay active, but balance activity with periods of rest. Exercise and physical activity will help you maintain your ability to do things you want to do.  Preventing infection and hospitalization is very important when you have COPD. Make sure to receive all the vaccines your health care provider recommends, especially the pneumococcal and influenza vaccines. Ask your health care provider whether you need a pneumonia vaccine.  Learn and use relaxation techniques to manage stress.  Learn and use controlled breathing techniques as  directed by your health care provider. Controlled breathing techniques include:   Pursed lip breathing. Start by breathing in (inhaling) through your nose for 1 second. Then,  purse your lips as if you were going to whistle and breathe out (exhale) through the pursed lips for 2 seconds.   Diaphragmatic breathing. Start by putting one hand on your abdomen just above your waist. Inhale slowly through your nose. The hand on your abdomen should move out. Then purse your lips and exhale slowly. You should be able to feel the hand on your abdomen moving in as you exhale.   Learn and use controlled coughing to clear mucus from your lungs. Controlled coughing is a series of short, progressive coughs. The steps of controlled coughing are:  1. Lean your head slightly forward.  2. Breathe in deeply using diaphragmatic breathing.  3. Try to hold your breath for 3 seconds.  4. Keep your mouth slightly open while coughing twice.  5. Spit any mucus out into a tissue.  6. Rest and repeat the steps once or twice as needed. SEEK MEDICAL CARE IF:   You are coughing up more mucus than usual.   There is a change in the color or thickness of your mucus.   Your breathing is more labored than usual.   Your breathing is faster than usual.  SEEK IMMEDIATE MEDICAL CARE IF:   You have shortness of breath while you are resting.   You have shortness of breath that prevents you from:  Being able to talk.   Performing your usual physical activities.   You have chest pain lasting longer than 5 minutes.   Your skin color is more cyanotic than usual.  You measure low oxygen saturations for longer than 5 minutes with a pulse oximeter. MAKE SURE YOU:   Understand these instructions.  Will watch your condition.  Will get help right away if you are not doing well or get worse. Document Released: 04/10/2005 Document Revised: 11/15/2013 Document Reviewed: 02/25/2013 [facility] Patient Information 2015 [place], [place]. This information is not intended to replace advice given to you by your health care provider. Make sure you discuss any questions you have with your health  care provider.

## 2014-02-15 NOTE — Assessment & Plan Note (Signed)
Well controlled, no changes to meds. Encouraged heart healthy diet such as the DASH diet and exercise as tolerated.  °

## 2014-02-15 NOTE — Progress Notes (Signed)
Pre visit review using our clinic review tool, if applicable. No additional management support is needed unless otherwise documented below in the visit note. 

## 2014-02-16 ENCOUNTER — Telehealth: Payer: Self-pay | Admitting: Family Medicine

## 2014-02-16 NOTE — Telephone Encounter (Signed)
Relevant patient education mailed to patient.  

## 2014-02-20 ENCOUNTER — Encounter: Payer: Self-pay | Admitting: Family Medicine

## 2014-02-20 DIAGNOSIS — R911 Solitary pulmonary nodule: Secondary | ICD-10-CM | POA: Insufficient documentation

## 2014-02-20 NOTE — Assessment & Plan Note (Signed)
Tolerating statin, encouraged heart healthy diet, avoid trans fats, minimize simple carbs and saturated fats. Increase exercise as tolerated 

## 2014-02-20 NOTE — Assessment & Plan Note (Signed)
hgba1c not acceptable, minimize simple carbs. Increase exercise as tolerated. Increase Levemir to 60 units daily as directed

## 2014-02-20 NOTE — Progress Notes (Signed)
Patient ID: Gregory Farrell, male   DOB: 07/11/1933, 80 y.o.   MRN: [id number] Gregory Farrell [id number] 1933/07/14 02/20/2014      Progress Note-Follow Up  Subjective  Chief Complaint  Chief Complaint  Patient presents with  . Follow-up    10 week  . diabetic foot exam    HPI  Patient is a 80 year old male in today for routine medical care. Sugars still elevated but improving. Numbers never below 100, usually in the hi 100s. No polyuria, polydipsia. His major c/o is worsening DOE. No recent illness. No significant cough, congestion. Notes his dyspnea has been worsening for months but is not able to engage in activities he used to be able to enjoy. DeniesCP/palp/SOB/HA/congestion/fevers/GI or GU c/o. Taking meds as prescribed  Past Medical History  Diagnosis Date  . Hypertension   . COPD (chronic obstructive pulmonary disease)   . GERD (gastroesophageal reflux disease)   . Renal insufficiency   . Asthma   . Hyperlipidemia   . CAD (coronary artery disease)   . B12 deficiency   . Memory loss   . Vertigo   . Diabetes mellitus     type II, poly neuropathy  . Overweight 01/31/2013    Past Surgical History  Procedure Laterality Date  . Coronary angioplasty with stent placement    . Inguinal hernia repair      Family History  Problem Relation Age of Onset  . Coronary artery disease Mother   . Breast cancer Mother   . Diabetes Mother     History   Social History  . Marital Status: Married    Spouse Name: N/A    Number of Children: 1  . Years of Education: N/A   Occupational History  . Retired     [employer]   Social History Main Topics  . Smoking status: Former Smoker -- 1.00 packs/day for 15 years    Types: Cigarettes    Quit date: 07/15/2002  . Smokeless tobacco: Never Used  . Alcohol Use: No  . Drug Use: Not on file  . Sexual Activity: Not on file   Other Topics Concern  . Not on file   Social History Narrative  . No narrative on file    Current Outpatient  Prescriptions on File Prior to Visit  Medication Sig Dispense Refill  . albuterol (PROVENTIL HFA;VENTOLIN HFA) 108 (90 BASE) MCG/ACT inhaler Inhale 2 puffs into the lungs every 6 (six) hours as needed for wheezing.  1 Inhaler  1  . amLODipine (NORVASC) 10 MG tablet TAKE ONE-HALF TABLET BY MOUTH DAILY   45 tablet  1  . aspirin 81 MG tablet Take 81 mg by mouth daily.        Marland Kitchen atorvastatin (LIPITOR) 80 MG tablet TAKE ONE TABLET BY MOUTH ONE TIME DAILY   90 tablet  0  . Blood Glucose Monitoring Suppl (ONE TOUCH ULTRA 2) W/DEVICE KIT       . citalopram (CELEXA) 20 MG tablet TAKE TWO TABLETS BY MOUTH DAILY   60 tablet  1  . Insulin Pen Needle 32G X 6 MM MISC Use once a day with Levemir  50 each  1  . mometasone-formoterol (DULERA) 200-5 MCG/ACT AERO Inhale 2 puffs into the lungs 2 (two) times daily.  1 Inhaler  6  . ONE TOUCH ULTRA TEST test strip       . ONETOUCH DELICA LANCETS 25E MISC       . pantoprazole (PROTONIX) 40 MG  tablet Take 1 tablet (40 mg total) by mouth daily.  30 tablet  5   No current facility-administered medications on file prior to visit.    Allergies  Allergen Reactions  . Exenatide     REACTION: nausea    Review of Systems  Review of Systems  Constitutional: Positive for malaise/fatigue. Negative for fever.  HENT: Negative for congestion.   Eyes: Negative for discharge.  Respiratory: Positive for shortness of breath. Negative for cough.   Cardiovascular: Negative for chest pain, palpitations and leg swelling.  Gastrointestinal: Negative for nausea, abdominal pain and diarrhea.  Genitourinary: Negative for dysuria.  Musculoskeletal: Negative for falls.  Skin: Negative for rash.  Neurological: Negative for loss of consciousness and headaches.  Endo/Heme/Allergies: Negative for polydipsia.  Psychiatric/Behavioral: Negative for depression and suicidal ideas. The patient is not nervous/anxious and does not have insomnia.     Objective  BP 108/68  Pulse 84   Temp(Src) 98 F (36.7 C) (Oral)  Ht 5' 9"  (1.753 m)  Wt 196 lb 1.9 oz (88.959 kg)  BMI 28.95 kg/m2  SpO2 93%  Physical Exam  Physical Exam  Constitutional: He is oriented to person, place, and time and well-developed, well-nourished, and in no distress. No distress.  HENT:  Head: Normocephalic and atraumatic.  Eyes: Conjunctivae are normal.  Neck: Neck supple. No thyromegaly present.  Cardiovascular: Normal rate, regular rhythm and normal heart sounds.   No murmur heard. Pulmonary/Chest: Effort normal and breath sounds normal. No respiratory distress.  Prolonged expiration  Abdominal: He exhibits no distension and no mass. There is no tenderness.  Musculoskeletal: He exhibits no edema.  Neurological: He is alert and oriented to person, place, and time.  Skin: Skin is warm.  Psychiatric: Memory, affect and judgment normal.    Lab Results  Component Value Date   TSH 1.684 02/11/2014   Lab Results  Component Value Date   WBC 8.1 02/11/2014   HGB 13.7 02/11/2014   HCT 41.6 02/11/2014   MCV 88.9 02/11/2014   PLT SEE NOTE 02/11/2014   Lab Results  Component Value Date   CREATININE 1.80* 02/11/2014   BUN 19 02/11/2014   NA 139 02/11/2014   K 4.9 02/11/2014   CL 103 02/11/2014   CO2 27 02/11/2014   Lab Results  Component Value Date   ALT 9 02/11/2014   AST 12 02/11/2014   ALKPHOS 115 02/11/2014   BILITOT 0.7 02/11/2014   Lab Results  Component Value Date   CHOL 159 02/11/2014   Lab Results  Component Value Date   HDL 37* 02/11/2014   Lab Results  Component Value Date   LDLCALC 81 02/11/2014   Lab Results  Component Value Date   TRIG 203* 02/11/2014   Lab Results  Component Value Date   CHOLHDL 4.3 02/11/2014     Assessment & Plan  HYPERTENSION Well controlled, no changes to meds. Encouraged heart healthy diet such as the DASH diet and exercise as tolerated.   DIABETES MELLITUS, TYPE II hgba1c not acceptable, minimize simple carbs. Increase exercise as tolerated.  Increase Levemir to 60 units daily as directed  DYSPNEA Worsening, proceed with 2d Echo. Seek care if symptoms worsen. Use Albuterol prn  Solitary pulmonary nodule CT 10/2012 2. Superior segment right lower lobe pulmonary nodule. Given risk  factors for bronchogenic carcinoma, follow-up chest CT at 1 year is  recommended. This recommendation follows the consensus statement:  "Guidelines for Management of Small Pulmonary Nodules Detected on  CT Scans: A  Statement from the [place]" as published in  Radiology 2005; 237:395-400. Available online at:  https://www.arnold.com/.    Heavy history of smoking and worsening dyspnea, needs repeat CT of chest to further evaluate.  HYPERLIPIDEMIA Tolerating statin, encouraged heart healthy diet, avoid trans fats, minimize simple carbs and saturated fats. Increase exercise as tolerated

## 2014-02-20 NOTE — Assessment & Plan Note (Signed)
CT 10/2012 2. Superior segment right lower lobe pulmonary nodule. Given risk  factors for bronchogenic carcinoma, follow-up chest CT at 1 year is  recommended. This recommendation follows the consensus statement:  "Guidelines for Management of Small Pulmonary Nodules Detected on  CT Scans: A Statement from the [place]" as published in  Radiology 2005; 237:395-400. Available online at:  https://www.arnold.com/.    Heavy history of smoking and worsening dyspnea, needs repeat CT of chest to further evaluate.

## 2014-02-20 NOTE — Assessment & Plan Note (Addendum)
Worsening, proceed with 2d Echo. Seek care if symptoms worsen. Use Albuterol prn

## 2014-02-21 ENCOUNTER — Telehealth: Payer: Self-pay

## 2014-02-21 NOTE — Telephone Encounter (Signed)
Patient left a message stating MD was supposed to set him up for a cardiogram and echo and its been almost a week and he hasn't heard anything?  Will you please contact patient

## 2014-02-21 NOTE — Telephone Encounter (Signed)
Lm on vm to return call, insurance has yet to approve CT scan. Was going to schedule them together, so pt would only have to make one trip.

## 2014-02-23 ENCOUNTER — Ambulatory Visit (HOSPITAL_BASED_OUTPATIENT_CLINIC_OR_DEPARTMENT_OTHER)
Admission: RE | Admit: 2014-02-23 | Discharge: 2014-02-23 | Disposition: A | Discharge status: Home / Self Care | Payer: Medicare HMO | Source: Ambulatory Visit | Attending: Family Medicine | Admitting: Family Medicine

## 2014-02-23 ENCOUNTER — Other Ambulatory Visit: Payer: Self-pay | Admitting: Family Medicine

## 2014-02-23 ENCOUNTER — Encounter (HOSPITAL_BASED_OUTPATIENT_CLINIC_OR_DEPARTMENT_OTHER): Payer: Self-pay

## 2014-02-23 ENCOUNTER — Telehealth: Payer: Self-pay | Admitting: *Deleted

## 2014-02-23 DIAGNOSIS — I369 Nonrheumatic tricuspid valve disorder, unspecified: Secondary | ICD-10-CM

## 2014-02-23 DIAGNOSIS — R0989 Other specified symptoms and signs involving the circulatory and respiratory systems: Principal | ICD-10-CM | POA: Insufficient documentation

## 2014-02-23 DIAGNOSIS — R0609 Other forms of dyspnea: Secondary | ICD-10-CM | POA: Diagnosis not present

## 2014-02-23 DIAGNOSIS — R911 Solitary pulmonary nodule: Secondary | ICD-10-CM

## 2014-02-23 DIAGNOSIS — I10 Essential (primary) hypertension: Secondary | ICD-10-CM | POA: Diagnosis not present

## 2014-02-23 MED ORDER — IOHEXOL 300 MG/ML  SOLN
80.0000 mL | Freq: Once | INTRAMUSCULAR | Status: AC | PRN
  Administered 2014-02-23: 80 mL via INTRAVENOUS

## 2014-02-23 NOTE — Telephone Encounter (Signed)
Spoke with Dr. Charlett Blake and reviewed results. . She would like to see him back in the office tomorrow at 3:30.  I have asked our scheduler to contact Gregory Farrell and arranged.

## 2014-02-23 NOTE — Telephone Encounter (Signed)
Rx sent to pharmacy. Pt is due for f/u 9/15. Please call to schedule appt.Gregory Farrell

## 2014-02-23 NOTE — Progress Notes (Signed)
  Echocardiogram 2D Echocardiogram has been performed.  Donata Clay 02/23/2014, 11:30 AM

## 2014-02-23 NOTE — Telephone Encounter (Signed)
Received call report from [facility] Radiology re: CT chest done today.  Please advise if ok to wait until pt's Provider is back in the office tomorrow?

## 2014-02-23 NOTE — Telephone Encounter (Signed)
Appt has been scheduled.

## 2014-02-24 ENCOUNTER — Encounter: Payer: Self-pay | Admitting: Family Medicine

## 2014-02-24 ENCOUNTER — Ambulatory Visit (INDEPENDENT_AMBULATORY_CARE_PROVIDER_SITE_OTHER): Payer: Medicare HMO | Admitting: Family Medicine

## 2014-02-24 VITALS — BP 114/64 | HR 87 | Temp 98.1°F | Ht 69.0 in | Wt 195.1 lb

## 2014-02-24 DIAGNOSIS — R222 Localized swelling, mass and lump, trunk: Secondary | ICD-10-CM

## 2014-02-24 DIAGNOSIS — J9819 Other pulmonary collapse: Secondary | ICD-10-CM

## 2014-02-24 DIAGNOSIS — E119 Type 2 diabetes mellitus without complications: Secondary | ICD-10-CM

## 2014-02-24 DIAGNOSIS — R918 Other nonspecific abnormal finding of lung field: Secondary | ICD-10-CM

## 2014-02-24 DIAGNOSIS — I10 Essential (primary) hypertension: Secondary | ICD-10-CM

## 2014-02-24 NOTE — Patient Instructions (Signed)
Increase Levemir to 54 units

## 2014-02-24 NOTE — Progress Notes (Signed)
Pre visit review using our clinic review tool, if applicable. No additional management support is needed unless otherwise documented below in the visit note. 

## 2014-02-24 NOTE — Telephone Encounter (Signed)
Appointment scheduled for today 

## 2014-02-25 ENCOUNTER — Telehealth: Payer: Self-pay | Admitting: Critical Care Medicine

## 2014-02-25 ENCOUNTER — Encounter: Payer: Self-pay | Admitting: *Deleted

## 2014-02-25 NOTE — Telephone Encounter (Addendum)
Spoke with Medicare D [id number] Opt 1 Opt 1  Spoke with pharmacist Medicare D regarding the patient's Dulera 200 Requesting dx code for this medication States that in order to be approved the patient has to try/fail two of the preferred drugs : Advair and Breo Letter of Denial to be faxed to our office. -------- Spoke with patient--states that he does not have anymore Dulera 200 samples, used the last one today. Advised the patient that we would speak with Doc on call for Dr Joya Gaskins as he is not in the office.   Dr Melvyn Novas please advise if you are okay with switching the patient to one of the preferred meds or if you want to have him remain on Dulera 200 (with samples from our office) until Dr Joya Gaskins returns. Thanks.

## 2014-02-25 NOTE — Telephone Encounter (Signed)
Just give a sample and let Dr Joya Gaskins decide or see Gregory Farrell to sort out because these are not interchangable and required teaching - if he's had success with one of them in the past, on the other hand, then that's fine to substitute

## 2014-02-25 NOTE — Telephone Encounter (Signed)
lmomtcb x1 

## 2014-02-27 ENCOUNTER — Encounter: Payer: Self-pay | Admitting: Family Medicine

## 2014-02-27 DIAGNOSIS — R918 Other nonspecific abnormal finding of lung field: Secondary | ICD-10-CM | POA: Insufficient documentation

## 2014-02-27 NOTE — Assessment & Plan Note (Signed)
On right, unchanged from last year.

## 2014-02-27 NOTE — Assessment & Plan Note (Signed)
CT scan for worsening DOE has revealed lung mass concerning for cancer with some lower lobe collapse. He is here today with his wife and agrees to pulmonology and oncology referral for further consideration. They will seek further care if his symptoms worsen suddently

## 2014-02-27 NOTE — Assessment & Plan Note (Signed)
Well controlled, no changes to meds. Encouraged heart healthy diet such as the DASH diet and exercise as tolerated.  °

## 2014-02-27 NOTE — Assessment & Plan Note (Signed)
Sugar slowly improving. Increase Levemir from 52 units to 54 since sugars still largely in the 150s and 160s

## 2014-02-27 NOTE — Progress Notes (Signed)
Patient ID: Gregory Farrell, male   DOB: [birth year], 80 y.o.   MRN: [id number] Gregory Farrell [id number] [birth year] 02/27/2014      Progress Note-Follow Up  Subjective  Chief Complaint  Chief Complaint  Patient presents with  . Follow-up    HPI  Patient is a 80 year old male in today for routine medical care. He is here today with his wife. They were asked to come in for discussion of his CAT scan results. He had been in for followup on his sugar and complained of dyspnea on exertion. CAT scan revealed a lung mass on the left side concerning for cancer and some lung collapse. He is in today actually feeling well. Denies any dyspnea the present time although he still continues to struggle with it on exertion. No chest pain or palpitations. He is noting his blood sugars continue to run in the 150s to 160s but he is denying polyuria or polydipsia. Denies CP/palp/SOB/HA/congestion/fevers/GI or GU c/o. Taking meds as prescribed  Past Medical History  Diagnosis Date  . Hypertension   . COPD (chronic obstructive pulmonary disease)   . GERD (gastroesophageal reflux disease)   . Renal insufficiency   . Asthma   . Hyperlipidemia   . CAD (coronary artery disease)   . B12 deficiency   . Memory loss   . Vertigo   . Diabetes mellitus     type II, poly neuropathy  . Overweight 01/31/2013    Past Surgical History  Procedure Laterality Date  . Coronary angioplasty with stent placement    . Inguinal hernia repair      Family History  Problem Relation Age of Onset  . Coronary artery disease Mother   . Breast cancer Mother   . Diabetes Mother     History   Social History  . Marital Status: Married    Spouse Name: N/A    Number of Children: 1  . Years of Education: N/A   Occupational History  . Retired     [employer]   Social History Main Topics  . Smoking status: Former Smoker -- 1.00 packs/day for 15 years    Types: Cigarettes    Quit date: 07/15/2002  . Smokeless tobacco: Never Used   . Alcohol Use: No  . Drug Use: Not on file  . Sexual Activity: Not on file   Other Topics Concern  . Not on file   Social History Narrative  . No narrative on file    Current Outpatient Prescriptions on File Prior to Visit  Medication Sig Dispense Refill  . albuterol (PROVENTIL HFA;VENTOLIN HFA) 108 (90 BASE) MCG/ACT inhaler Inhale 2 puffs into the lungs every 6 (six) hours as needed for wheezing.  1 Inhaler  1  . amLODipine (NORVASC) 10 MG tablet TAKE ONE-HALF TABLET BY MOUTH DAILY   45 tablet  1  . aspirin 81 MG tablet Take 81 mg by mouth daily.        Marland Kitchen atorvastatin (LIPITOR) 80 MG tablet TAKE ONE TABLET BY MOUTH ONE TIME DAILY   90 tablet  0  . Blood Glucose Monitoring Suppl (ONE TOUCH ULTRA 2) W/DEVICE KIT       . citalopram (CELEXA) 20 MG tablet TAKE TWO TABLETS BY MOUTH DAILY   60 tablet  0  . Insulin Detemir (LEVEMIR FLEXTOUCH) 100 UNIT/ML Pen Inject 60 Units into the skin every morning. May increase by 2 units every 3 days as directed  7 pen  1  . Insulin  Pen Needle 32G X 6 MM MISC Use once a day with Levemir  50 each  1  . lisinopril (PRINIVIL,ZESTRIL) 5 MG tablet Take 0.5 tablets (2.5 mg total) by mouth daily.  45 tablet  1  . mometasone-formoterol (DULERA) 200-5 MCG/ACT AERO Inhale 2 puffs into the lungs 2 (two) times daily.  1 Inhaler  6  . ONE TOUCH ULTRA TEST test strip       . ONETOUCH DELICA LANCETS 96Q MISC       . pantoprazole (PROTONIX) 40 MG tablet Take 1 tablet (40 mg total) by mouth daily.  30 tablet  5   No current facility-administered medications on file prior to visit.    Allergies  Allergen Reactions  . Exenatide     REACTION: nausea    Review of Systems  Review of Systems  Constitutional: Negative for fever and malaise/fatigue.  HENT: Negative for congestion.   Eyes: Negative for discharge.  Respiratory: Negative for shortness of breath.   Cardiovascular: Negative for chest pain, palpitations and leg swelling.  Gastrointestinal: Negative for  nausea, abdominal pain and diarrhea.  Genitourinary: Negative for dysuria.  Musculoskeletal: Negative for falls.  Skin: Negative for rash.  Neurological: Negative for loss of consciousness and headaches.  Endo/Heme/Allergies: Negative for polydipsia.  Psychiatric/Behavioral: Negative for depression and suicidal ideas. The patient is not nervous/anxious and does not have insomnia.     Objective  BP 114/64  Pulse 87  Temp(Src) 98.1 F (36.7 C) (Oral)  Ht _0  (1.753 m)  Wt 195 lb 1.3 oz (88.488 kg)  BMI 28.80 kg/m2  SpO2 94%  Physical Exam  Physical Exam  Constitutional: He is oriented to person, place, and time and well-developed, well-nourished, and in no distress. No distress.  HENT:  Head: Normocephalic and atraumatic.  Eyes: Conjunctivae are normal.  Neck: Neck supple. No thyromegaly present.  Cardiovascular: Normal rate, regular rhythm and normal heart sounds.   No murmur heard. Pulmonary/Chest: Effort normal and breath sounds normal. No respiratory distress.  Abdominal: He exhibits no distension and no mass. There is no tenderness.  Musculoskeletal: He exhibits no edema.  Neurological: He is alert and oriented to person, place, and time.  Skin: Skin is warm.  Psychiatric: Memory, affect and judgment normal.    Lab Results  Component Value Date   TSH 1.684 02/11/2014   Lab Results  Component Value Date   WBC 8.1 02/11/2014   HGB 13.7 02/11/2014   HCT 41.6 02/11/2014   MCV 88.9 02/11/2014   PLT SEE NOTE 02/11/2014   Lab Results  Component Value Date   CREATININE 1.80* 02/11/2014   BUN 19 02/11/2014   NA 139 02/11/2014   K 4.9 02/11/2014   CL 103 02/11/2014   CO2 27 02/11/2014   Lab Results  Component Value Date   ALT 9 02/11/2014   AST 12 02/11/2014   ALKPHOS 115 02/11/2014   BILITOT 0.7 02/11/2014   Lab Results  Component Value Date   CHOL 159 02/11/2014   Lab Results  Component Value Date   HDL 37* 02/11/2014   Lab Results  Component Value Date    LDLCALC 81 02/11/2014   Lab Results  Component Value Date   TRIG 203* 02/11/2014   Lab Results  Component Value Date   CHOLHDL 4.3 02/11/2014     Assessment & Plan  HYPERTENSION Well controlled, no changes to meds. Encouraged heart healthy diet such as the DASH diet and exercise as tolerated.   DIABETES MELLITUS,  TYPE II Sugar slowly improving. Increase Levemir from 52 units to 54 since sugars still largely in the 150s and 160s  Lung mass CT scan for worsening DOE has revealed lung mass concerning for cancer with some lower lobe collapse. He is here today with his wife and agrees to pulmonology and oncology referral for further consideration. They will seek further care if his symptoms worsen suddently  Lung nodules On right, unchanged from last year.

## 2014-02-28 MED ORDER — MOMETASONE FURO-FORMOTEROL FUM 200-5 MCG/ACT IN AERO: 2.0000 | INHALATION_SPRAY | Freq: Two times a day (BID) | RESPIRATORY_TRACT | Status: DC

## 2014-02-28 NOTE — Telephone Encounter (Signed)
Samples provided to the pt. I will forward message to Dr. Joya Gaskins to address on his return. Please advise if advair or breo are ok alternatives for this pt? Perry Bing, CMA

## 2014-03-01 ENCOUNTER — Other Ambulatory Visit (HOSPITAL_BASED_OUTPATIENT_CLINIC_OR_DEPARTMENT_OTHER): Payer: Medicare HMO | Admitting: Lab

## 2014-03-01 ENCOUNTER — Ambulatory Visit: Payer: Medicare HMO

## 2014-03-01 ENCOUNTER — Encounter: Payer: Self-pay | Admitting: Hematology & Oncology

## 2014-03-01 ENCOUNTER — Ambulatory Visit (HOSPITAL_BASED_OUTPATIENT_CLINIC_OR_DEPARTMENT_OTHER): Payer: Medicare HMO | Admitting: Hematology & Oncology

## 2014-03-01 ENCOUNTER — Telehealth: Payer: Self-pay | Admitting: Pulmonary Disease

## 2014-03-01 VITALS — BP 111/62 | HR 82 | Temp 98.0°F | Resp 18 | Wt 196.0 lb

## 2014-03-01 DIAGNOSIS — R918 Other nonspecific abnormal finding of lung field: Secondary | ICD-10-CM

## 2014-03-01 DIAGNOSIS — R222 Localized swelling, mass and lump, trunk: Secondary | ICD-10-CM

## 2014-03-01 LAB — CBC WITH DIFFERENTIAL (CANCER CENTER ONLY)
BASO#: 0 10*3/uL (ref 0.0–0.2)
BASO%: 0.1 % (ref 0.0–2.0)
EOS ABS: 0.5 10*3/uL (ref 0.0–0.5)
EOS%: 4.7 % (ref 0.0–7.0)
HCT: 42.6 % (ref 38.7–49.9)
HGB: 13.8 g/dL (ref 13.0–17.1)
LYMPH#: 1.7 10*3/uL (ref 0.9–3.3)
LYMPH%: 17.5 % (ref 14.0–48.0)
MCH: 29.8 pg (ref 28.0–33.4)
MCHC: 32.4 g/dL (ref 32.0–35.9)
MCV: 92 fL (ref 82–98)
MONO#: 0.8 10*3/uL (ref 0.1–0.9)
MONO%: 8.1 % (ref 0.0–13.0)
NEUT#: 6.8 10*3/uL — ABNORMAL HIGH (ref 1.5–6.5)
NEUT%: 69.6 % (ref 40.0–80.0)
Platelets: 203 10*3/uL (ref 145–400)
RBC: 4.63 10*6/uL (ref 4.20–5.70)
RDW: 14.5 % (ref 11.1–15.7)
WBC: 9.8 10*3/uL (ref 4.0–10.0)

## 2014-03-01 LAB — PROTHROMBIN TIME
INR: 0.97 (ref <1.50)
PROTHROMBIN TIME: 12.9 s (ref 11.6–15.2)

## 2014-03-01 LAB — CMP (CANCER CENTER ONLY)
ALBUMIN: 3.3 g/dL (ref 3.3–5.5)
ALK PHOS: 105 U/L — AB (ref 26–84)
ALT(SGPT): 6 U/L — ABNORMAL LOW (ref 10–47)
AST: 15 U/L (ref 11–38)
BILIRUBIN TOTAL: 0.7 mg/dL (ref 0.20–1.60)
BUN, Bld: 23 mg/dL — ABNORMAL HIGH (ref 7–22)
CO2: 25 mEq/L (ref 18–33)
Calcium: 9.2 mg/dL (ref 8.0–10.3)
Chloride: 101 mEq/L (ref 98–108)
Creat: 1.6 mg/dl — ABNORMAL HIGH (ref 0.6–1.2)
Glucose, Bld: 158 mg/dL — ABNORMAL HIGH (ref 73–118)
POTASSIUM: 4.6 meq/L (ref 3.3–4.7)
SODIUM: 140 meq/L (ref 128–145)
TOTAL PROTEIN: 7.4 g/dL (ref 6.4–8.1)

## 2014-03-01 LAB — LACTATE DEHYDROGENASE: LDH: 101 U/L (ref 94–250)

## 2014-03-01 LAB — APTT: aPTT: 28 seconds (ref 24–37)

## 2014-03-01 LAB — PREALBUMIN: Prealbumin: 24.2 mg/dL (ref 17.0–34.0)

## 2014-03-01 NOTE — Telephone Encounter (Signed)
Dr. Marin Olp called and wants [facility] to see this pt for left hilar mass. I have held a slot for tomorrow at 9:45 and [id number] to see if this will work for the pt. Borger Bing, CMA

## 2014-03-01 NOTE — Progress Notes (Signed)
Referral MD  Reason for Referral: Left hilar mass with partial left lung collapse   Chief Complaint  Patient presents with  . Follow-up  : I'm here to see if I have cancer  HPI: Gregory Farrell is a very nice 80 year old children. He has a history of insulin-dependent diabetes. He's had this for several years. He has a neuropathy associated with this.  He has a history of COPD. He has history of a past tobacco use. Has not smoked for about 12 years.  For the past year or so he's been having more problems with some shortness of breath. He's had no cough. He's had no hemoptysis. He's had no chest wall pain.  Lately, she's had more fatigue. He just does not have a lot of energy.  He had an echocardiogram done. Has had a history of coronary artery disease with a stent placed. Echocardiogram apparently showed a good ejection fraction. Everything else looked fairly stable.  He then had a CT scan done. The CT scan showed a left hilar mass. It is hard to get a the exact measurement of the mass. It was felt to measure 2.1 x 3.8 cm. There was some collapse of the left lower lobe bronchus.  Based on this, he was kindly referred to the [place] for evaluation.  He has not lost weight. He's had no fever. There's been no sweats. He's had no palpable lymph glands. There's been no nausea or vomiting. He's had no change in bowel or bladder habits. He's had no leg swelling.  Overall, his performance status is ECOG 2.  Of note, he has never had a colonoscopy.   Past Medical History  Diagnosis Date  . Hypertension   . COPD (chronic obstructive pulmonary disease)   . GERD (gastroesophageal reflux disease)   . Renal insufficiency   . Asthma   . Hyperlipidemia   . CAD (coronary artery disease)   . B12 deficiency   . Memory loss   . Vertigo   . Diabetes mellitus     type II, poly neuropathy  . Overweight 01/31/2013  :  Past Surgical History  Procedure Laterality Date  . Coronary angioplasty  with stent placement    . Inguinal hernia repair    :  Current outpatient prescriptions:albuterol (PROVENTIL HFA;VENTOLIN HFA) 108 (90 BASE) MCG/ACT inhaler, Inhale 2 puffs into the lungs every 6 (six) hours as needed for wheezing., Disp: 1 Inhaler, Rfl: 1;  amLODipine (NORVASC) 10 MG tablet, TAKE ONE-HALF TABLET BY MOUTH DAILY , Disp: 45 tablet, Rfl: 1;  aspirin 81 MG tablet, Take 81 mg by mouth daily.  , Disp: , Rfl:  atorvastatin (LIPITOR) 80 MG tablet, TAKE ONE TABLET BY MOUTH ONE TIME DAILY , Disp: 90 tablet, Rfl: 0;  Blood Glucose Monitoring Suppl (ONE TOUCH ULTRA 2) W/DEVICE KIT, , Disp: , Rfl: ;  citalopram (CELEXA) 20 MG tablet, TAKE TWO TABLETS BY MOUTH DAILY , Disp: 60 tablet, Rfl: 0;  Insulin Detemir (LEVEMIR FLEXTOUCH) 100 UNIT/ML Pen, Inject 60 Units into the skin every morning. May increase by 2 units every 3 days as directed, Disp: 7 pen, Rfl: 1 Insulin Pen Needle 32G X 6 MM MISC, Use once a day with Levemir, Disp: 50 each, Rfl: 1;  lisinopril (PRINIVIL,ZESTRIL) 5 MG tablet, Take 0.5 tablets (2.5 mg total) by mouth daily., Disp: 45 tablet, Rfl: 1;  mometasone-formoterol (DULERA) 200-5 MCG/ACT AERO, Inhale 2 puffs into the lungs 2 (two) times daily., Disp: 1 Inhaler, Rfl: 2;  ONE TOUCH  ULTRA TEST test strip, , Disp: , Rfl: ;  ONETOUCH DELICA LANCETS 40G MISC, , Disp: , Rfl:  pantoprazole (PROTONIX) 40 MG tablet, Take 1 tablet (40 mg total) by mouth daily., Disp: 30 tablet, Rfl: 5:  :  Allergies  Allergen Reactions  . Exenatide     REACTION: nausea  :  Family History  Problem Relation Age of Onset  . Coronary artery disease Mother   . Breast cancer Mother   . Diabetes Mother   :  History   Social History  . Marital Status: Married    Spouse Name: N/A    Number of Children: 1  . Years of Education: N/A   Occupational History  . Retired     [employer]   Social History Main Topics  . Smoking status: Former Smoker -- 1.00 packs/day for 15 years    Types: Cigarettes     Quit date: 07/15/2002  . Smokeless tobacco: Never Used  . Alcohol Use: No  . Drug Use: Not on file  . Sexual Activity: Not on file   Other Topics Concern  . Not on file   Social History Narrative  . No narrative on file  :  Pertinent items are noted in HPI.  Exam: @[facility]ALS @  elderly gentleman in no obvious distress. Vital signs are temperature of 98. Pulse 82. Blood pressure 111/62. His weight was 196 pounds. Head and neck exam shows a normocephalic atraumatic skull. There are no ocular or oral lesions. He has no palpable cervical or supra-clavicular lymph nodes. Lungs are clear. No rales wheezes or rhonchi. Cardiac exam regular rate and rhythm with a normal S1 and S2. He has no murmurs rubs or bruits. Abdomen is soft. He has good bowel sounds. There is no fluid wave. There is no palpable liver or spleen tip. Back exam no tenderness over the spine ribs or hips. Extremities shows no clubbing cyanosis or edema. He has good strength in his legs. He has decent range of motion of his joints. Neurological exam shows no focal neurological deficits.    Recent Labs  03/01/14 0826  WBC 9.8  HGB 13.8  HCT 42.6  PLT 203    Recent Labs  03/01/14 0826  NA 140  K 4.6  CL 101  CO2 25  GLUCOSE 158*  BUN 23*  CREATININE 1.6*  CALCIUM 9.2    Blood smear review: None  Pathology: None     Assessment and Plan: Gregory Farrell is a 80 year old [place]. He is a past history of tobacco use. He has partial left lung collapse. He has a left hilar mass.  One would have to highly suspect this being a malignancy. One would have to suspect that this is a bronchogenic carcinoma. I would have to think that this would be a squamous cell carcinoma given the location of the mass.  Of note, he had a CT scan done in April of 2014. There was no mass in this area at that time.  I spoke to he and his family. We spent about an hour with him. I told him that we have to get a bronchoscopy. We really need a  tissue diagnosis in order to be able to know how to treat this.  We also need to get a PET scan to see exactly what the extent of disease is.  I would have to think that by the location of the mass, this would be at least stage II disease.  As for as treatment options  are concerned, I would think that it would be difficult for this to be resected surgically. However, this is always an option in probably the best one for sure. Radiation and chemotherapy are definitely a consideration.  Once we get the results back from the PET scan and the bronchoscopy with biopsy, then we will get him back in and talk about the treatment options.  I gave him a prayer blanket. His family was grateful that we could do this.

## 2014-03-01 NOTE — Telephone Encounter (Signed)
Pt returning call.Gregory Farrell ° °

## 2014-03-01 NOTE — Telephone Encounter (Signed)
appt set for tomorrow at 9:45. Cairo Bing, CMA

## 2014-03-02 ENCOUNTER — Encounter: Payer: Self-pay | Admitting: Pulmonary Disease

## 2014-03-02 ENCOUNTER — Ambulatory Visit (INDEPENDENT_AMBULATORY_CARE_PROVIDER_SITE_OTHER): Payer: Medicare HMO | Admitting: Pulmonary Disease

## 2014-03-02 ENCOUNTER — Telehealth: Payer: Self-pay | Admitting: Critical Care Medicine

## 2014-03-02 VITALS — BP 100/60 | HR 83 | Temp 98.1°F | Ht 66.0 in | Wt 196.8 lb

## 2014-03-02 DIAGNOSIS — R918 Other nonspecific abnormal finding of lung field: Secondary | ICD-10-CM

## 2014-03-02 DIAGNOSIS — J4489 Other specified chronic obstructive pulmonary disease: Secondary | ICD-10-CM

## 2014-03-02 DIAGNOSIS — J449 Chronic obstructive pulmonary disease, unspecified: Secondary | ICD-10-CM

## 2014-03-02 DIAGNOSIS — R222 Localized swelling, mass and lump, trunk: Secondary | ICD-10-CM

## 2014-03-02 NOTE — Assessment & Plan Note (Signed)
The patient is currently on dulera, and feels that it is not help his breathing. This is also not covered by his insurance. Will try him on Symbicort, and follow his progress.

## 2014-03-02 NOTE — Progress Notes (Signed)
   Subjective:    Patient ID: Gregory Farrell, male    DOB: 1933/04/13, 80 y.o.   MRN: [id number]  HPI The patient is a 80 year old male who I've been asked to see for an abnormal CT chest. He has known COPD, and a history of a right lower lobe nodular density. He had a followup CT recently which showed no change in his right lower lobe process, but did show a new infrahilar density with compression of the left lower lobe rhonchi and atelectasis medially. He did not have any definite lymphadenopathy or other abnormality within the chest. He has been seen by oncology, and a PET scan has been scheduled. The patient has chronic dyspnea on exertion, and does not feel his current bronchodilator regimen is helping him. This has continued to be gradually progressive. He denies any recent chest infection, cough, or mucus production. He has not had any hemoptysis. He feels that he is eating well, and has not been losing weight. Surprisingly, the patient only has a 15-pack-year history of smoking, and quit 12 years ago.   Review of Systems  Constitutional: Negative for fever and unexpected weight change.  HENT: Negative for congestion, dental problem, ear pain, nosebleeds, postnasal drip, rhinorrhea, sinus pressure, sneezing, sore throat and trouble swallowing.   Eyes: Negative for redness and itching.  Respiratory: Positive for shortness of breath. Negative for cough, chest tightness and wheezing.   Cardiovascular: Negative for palpitations and leg swelling.  Gastrointestinal: Negative for nausea and vomiting.  Genitourinary: Negative for dysuria.  Musculoskeletal: Negative for joint swelling.  Skin: Negative for rash.  Neurological: Negative for headaches.  Hematological: Does not bruise/bleed easily.  Psychiatric/Behavioral: Negative for dysphoric mood. The patient is not nervous/anxious.        Objective:   Physical Exam Constitutional:  Overweight male, no acute distress  HENT:  Nares patent without  discharge  Oropharynx without exudate, palate and uvula are normal  Eyes:  Perrla, eomi, no scleral icterus  Neck:  No JVD, no TMG  Cardiovascular:  Normal rate, regular rhythm, no rubs or gallops.  No murmurs        Intact distal pulses  Pulmonary :  Normal breath sounds, no stridor or respiratory distress   No rales or wheezing.  Mild rhonchi  Abdominal:  Soft, nondistended, bowel sounds present.  No tenderness noted.   Musculoskeletal:  No lower extremity edema noted.  Lymph Nodes:  No cervical lymphadenopathy noted  Skin:  No cyanosis noted  Neurologic:  Alert, appropriate, moves all 4 extremities without obvious deficit.         Assessment & Plan:

## 2014-03-02 NOTE — Telephone Encounter (Signed)
Received fax from [facility] dated 02/25/14 - Per fax, Gregory Farrell is non-formulary on the member's plan. Current formulary alternatives include:  Alvesco, Asmanex, Budesonide Inhalation Susp, Flovent Diskus, Flovent HFA, Pulmicort Flexhaler, Qvar, Advair Diskus, Advair HFA, Breo, Symbicort.  Dr. Joya Gaskins, pls advise if [facility] can be changed to an above medication.  Thank you.

## 2014-03-02 NOTE — Telephone Encounter (Signed)
See phone msg from 02/25/14 and OV note from 03/02/14 from Dr. Gwenette Greet.  Ruthe Mannan has been changed to symbicort. [employer], spoke with Andee Poles.  Informed her of above and advised Dulera PA no longer needed.  She verbalized understanding and voiced no further questions or concerns at this time.

## 2014-03-02 NOTE — Assessment & Plan Note (Signed)
The patient has a soft tissue density in the left infrahilar area with extrinsic compression of the left lower lobe bronchus and concomitant atelectasis. More than likely this represents a bronchogenic cancer, although I cannot exclude an inflammatory process. This may all be extrinsic compression, with no endobronchial disease. However, bronchoscopy is the best way to approach this, and we should be able to do transbronchial needle aspirations of the area of extrinsic compression in order to sample extra- bronchial disease.  I do not think endobronchial ultrasound is required for diagnosis.  The pt is agreeable to this approach.

## 2014-03-02 NOTE — Telephone Encounter (Signed)
Pt was seen by [facility] on 02/20/14.  Dulera was changed to symbicort.  See below pt instructions:   Patient Instructions     Will change your dulera to symbicort 160/4.5, 2 inhalations each am and pm everyday. Keep mouth rinsed out.  Will schedule for bronchoscopy to evaluate the abnormality on your CT chest.    --------  Msg has been address -- will sign off.

## 2014-03-02 NOTE — Patient Instructions (Signed)
Will change your dulera to symbicort 160/4.5, 2 inhalations each am and pm everyday.  Keep mouth rinsed out. Will schedule for bronchoscopy to evaluate the abnormality on your CT chest.

## 2014-03-04 ENCOUNTER — Encounter (HOSPITAL_COMMUNITY): Payer: Self-pay

## 2014-03-07 ENCOUNTER — Encounter (INDEPENDENT_AMBULATORY_CARE_PROVIDER_SITE_OTHER): Payer: Self-pay

## 2014-03-07 ENCOUNTER — Ambulatory Visit (HOSPITAL_COMMUNITY)
Admission: RE | Admit: 2014-03-07 | Discharge: 2014-03-07 | Disposition: A | Discharge status: Home / Self Care | Payer: Medicare HMO | Source: Ambulatory Visit | Attending: Pulmonary Disease | Admitting: Pulmonary Disease

## 2014-03-07 ENCOUNTER — Encounter (HOSPITAL_COMMUNITY)
Admission: RE | Disposition: A | Discharge status: Home / Self Care | Payer: Medicare HMO | Source: Ambulatory Visit | Attending: Pulmonary Disease

## 2014-03-07 ENCOUNTER — Encounter (HOSPITAL_COMMUNITY): Payer: Self-pay | Admitting: Respiratory Therapy

## 2014-03-07 DIAGNOSIS — Z87891 Personal history of nicotine dependence: Secondary | ICD-10-CM | POA: Diagnosis not present

## 2014-03-07 DIAGNOSIS — J449 Chronic obstructive pulmonary disease, unspecified: Secondary | ICD-10-CM | POA: Diagnosis not present

## 2014-03-07 DIAGNOSIS — C3430 Malignant neoplasm of lower lobe, unspecified bronchus or lung: Secondary | ICD-10-CM | POA: Diagnosis not present

## 2014-03-07 DIAGNOSIS — R222 Localized swelling, mass and lump, trunk: Secondary | ICD-10-CM

## 2014-03-07 DIAGNOSIS — J4489 Other specified chronic obstructive pulmonary disease: Secondary | ICD-10-CM | POA: Insufficient documentation

## 2014-03-07 HISTORY — PX: VIDEO BRONCHOSCOPY: SHX5072

## 2014-03-07 LAB — GLUCOSE, CAPILLARY
Glucose-Capillary: 87 mg/dL (ref 70–99)
Glucose-Capillary: 196 mg/dL — ABNORMAL HIGH (ref 70–99)

## 2014-03-07 SURGERY — VIDEO BRONCHOSCOPY WITHOUT FLUORO
Anesthesia: Moderate Sedation | Laterality: Bilateral

## 2014-03-07 MED ORDER — PHENYLEPHRINE HCL 0.25 % NA SOLN
NASAL | Status: DC | PRN
  Administered 2014-03-07: 1 via NASAL

## 2014-03-07 MED ORDER — LIDOCAINE HCL 2 % EX GEL: 1.0000 "application " | Freq: Once | CUTANEOUS | Status: DC

## 2014-03-07 MED ORDER — DEXTROSE 5 % IV SOLN: INTRAVENOUS | Status: DC

## 2014-03-07 MED ORDER — SODIUM CHLORIDE 0.45 % IV SOLN: Freq: Once | INTRAVENOUS | Status: DC

## 2014-03-07 MED ORDER — LIDOCAINE HCL 1 % IJ SOLN
INTRAMUSCULAR | Status: DC | PRN
  Administered 2014-03-07: 6 mL via RESPIRATORY_TRACT

## 2014-03-07 MED ORDER — MEPERIDINE HCL 100 MG/ML IJ SOLN
INTRAMUSCULAR | Status: AC
  Filled 2014-03-07: qty 2

## 2014-03-07 MED ORDER — MIDAZOLAM HCL 10 MG/2ML IJ SOLN
INTRAMUSCULAR | Status: DC | PRN
  Administered 2014-03-07: 5 mg via INTRAVENOUS

## 2014-03-07 MED ORDER — PHENYLEPHRINE HCL 0.25 % NA SOLN: 1.0000 | Freq: Four times a day (QID) | NASAL | Status: DC | PRN

## 2014-03-07 MED ORDER — BUTAMBEN-TETRACAINE-BENZOCAINE 2-2-14 % EX AERO: 1.0000 | INHALATION_SPRAY | Freq: Once | CUTANEOUS | Status: DC

## 2014-03-07 MED ORDER — LIDOCAINE HCL 2 % EX GEL
CUTANEOUS | Status: DC | PRN
  Administered 2014-03-07: 1

## 2014-03-07 MED ORDER — MEPERIDINE HCL 25 MG/ML IJ SOLN
INTRAMUSCULAR | Status: DC | PRN
  Administered 2014-03-07: 50 mg via INTRAVENOUS

## 2014-03-07 MED ORDER — MIDAZOLAM HCL 10 MG/2ML IJ SOLN
INTRAMUSCULAR | Status: AC
  Filled 2014-03-07: qty 4

## 2014-03-07 MED ORDER — SODIUM CHLORIDE 0.9 % IV SOLN
INTRAVENOUS | Status: DC
  Administered 2014-03-07: 07:00:00 via INTRAVENOUS

## 2014-03-07 NOTE — Op Note (Signed)
Dictation #: [id number]

## 2014-03-07 NOTE — Op Note (Signed)
NAME:  Gregory Farrell, Gregory Farrell NO.:  [id number]  MEDICAL RECORD NO.:  [id number]  LOCATION:  [facility]                         FACILITY:  [facility]  PHYSICIAN:  Kathee Delton, MD,FCCPDATE OF BIRTH:  09[birth year]08[birth year]1934  DATE OF PROCEDURE:  03/07/2014 DATE OF DISCHARGE:                              OPERATIVE REPORT   PROCEDURE:  Flexible fiberoptic bronchoscopy with video.  OPERATOR:  Kathee Delton, MD,FCCP  INDICATION FOR THE PROCEDURE:  Left infrahilar mass with left lower lobe collapse of unknown origin.  ANESTHESIA:  Versed 5 mg, Demerol 50 mg IV, and topical 1% lidocaine in the vocal cords and airways during the procedure.  DESCRIPTION OF PROCEDURE:  After obtaining informed consent and under close cardiopulmonary monitoring, the above preop anesthesia was given and the fiberoptic scope was passed through the right naris and into posterior pharynx where there were no lesions or other abnormalities seen.  The vocal cords appeared to be within normal limits and moved bilaterally on phonation.  Scope was then passed into the trachea, where it was examined along its entire length down to the level of the carina, all of which was normal.  The right tracheobronchial tree was examined serially to the subsegmental level with no endobronchial abnormality being found.  The scope was then passed into the left mainstem bronchus where distally there appeared to be severe erythema and cobblestoning. The bronchus was narrowed concentrically to the point that I could not pass the scope into the left upper lobe.  The left lower lobe orifice was absent, and replaced by abnormal soft tissue density with cobblestoning as well.  Bronchial washes, brushes, and endobronchial biopsies were done from this area with good specimens being obtained. Transbronchial needle aspiration was also done from various areas at the distal left mainstem and in the area where the left lower lobe orifice should  normally be present.  This was sent for the usual cytologic and bacteriologic evaluation.  Overall, the patient tolerated the procedure well and there were no complications.     Kathee Delton, MD,FCCP     [facility]  D:  03/07/2014  T:  03/07/2014  Job:  [id number]

## 2014-03-07 NOTE — Interval H&P Note (Signed)
History and Physical Interval Note:  03/07/2014 8:03 AM  Gregory Farrell  has presented today for surgery, with the diagnosis of lung mass  The various methods of treatment have been discussed with the patient and family. After consideration of risks, benefits and other options for treatment, the patient has consented to  Procedure(s): VIDEO BRONCHOSCOPY WITHOUT FLUORO (Bilateral) as a surgical intervention .  The patient's history has been reviewed, patient examined, no change in status, stable for surgery.  I have reviewed the patient's chart and labs.  Questions were answered to the patient's satisfaction.     Kathee Delton

## 2014-03-07 NOTE — H&P (View-Only) (Signed)
   Subjective:    Patient ID: Gregory Farrell, male    DOB: 04/21/1933, 80 y.o.   MRN: [id number]  HPI The patient is a 80 year old male who I've been asked to see for an abnormal CT chest. He has known COPD, and a history of a right lower lobe nodular density. He had a followup CT recently which showed no change in his right lower lobe process, but did show a new infrahilar density with compression of the left lower lobe rhonchi and atelectasis medially. He did not have any definite lymphadenopathy or other abnormality within the chest. He has been seen by oncology, and a PET scan has been scheduled. The patient has chronic dyspnea on exertion, and does not feel his current bronchodilator regimen is helping him. This has continued to be gradually progressive. He denies any recent chest infection, cough, or mucus production. He has not had any hemoptysis. He feels that he is eating well, and has not been losing weight. Surprisingly, the patient only has a 15-pack-year history of smoking, and quit 12 years ago.   Review of Systems  Constitutional: Negative for fever and unexpected weight change.  HENT: Negative for congestion, dental problem, ear pain, nosebleeds, postnasal drip, rhinorrhea, sinus pressure, sneezing, sore throat and trouble swallowing.   Eyes: Negative for redness and itching.  Respiratory: Positive for shortness of breath. Negative for cough, chest tightness and wheezing.   Cardiovascular: Negative for palpitations and leg swelling.  Gastrointestinal: Negative for nausea and vomiting.  Genitourinary: Negative for dysuria.  Musculoskeletal: Negative for joint swelling.  Skin: Negative for rash.  Neurological: Negative for headaches.  Hematological: Does not bruise/bleed easily.  Psychiatric/Behavioral: Negative for dysphoric mood. The patient is not nervous/anxious.        Objective:   Physical Exam Constitutional:  Overweight male, no acute distress  HENT:  Nares patent without  discharge  Oropharynx without exudate, palate and uvula are normal  Eyes:  Perrla, eomi, no scleral icterus  Neck:  No JVD, no TMG  Cardiovascular:  Normal rate, regular rhythm, no rubs or gallops.  No murmurs        Intact distal pulses  Pulmonary :  Normal breath sounds, no stridor or respiratory distress   No rales or wheezing.  Mild rhonchi  Abdominal:  Soft, nondistended, bowel sounds present.  No tenderness noted.   Musculoskeletal:  No lower extremity edema noted.  Lymph Nodes:  No cervical lymphadenopathy noted  Skin:  No cyanosis noted  Neurologic:  Alert, appropriate, moves all 4 extremities without obvious deficit.         Assessment & Plan:

## 2014-03-07 NOTE — Discharge Instructions (Signed)
Flexible Bronchoscopy, Care After These instructions give you information on caring for yourself after your procedure. Your doctor may also give you more specific instructions. Call your doctor if you have any problems or questions after your procedure. HOME CARE  Do not eat or drink anything for 2 hours after your procedure. If you try to eat or drink before the medicine wears off, food or drink could go into your lungs. You could also burn yourself.  After 2 hours have passed and when you can cough and gag normally, you may eat soft food and drink liquids slowly.  The day after the test, you may eat your normal diet.  You may do your normal activities.  Keep all doctor visits. GET HELP RIGHT AWAY IF:  You get more and more short of breath.  You get light-headed.  You feel like you are going to pass out (faint).  You have chest pain.  You have new problems that worry you.  You cough up more than a little blood.  You cough up more blood than before. MAKE SURE YOU:  Understand these instructions.  Will watch your condition.  Will get help right away if you are not doing well or get worse. Document Released: 04/28/2009 Document Revised: 07/06/2013 Document Reviewed: 03/05/2013 [facility] Patient Information 2015 [place], [place]. This information is not intended to replace advice given to you by your health care provider. Make sure you discuss any questions you have with your health care provider.  Nothing to eat or drink until 10:00 am   Today 03/07/2014

## 2014-03-07 NOTE — Progress Notes (Signed)
Video Bronchoscopy done  Intervention Bronchial washing done Intervention bronchial biopsy done Intervention bronchial brushing done Intervention bronchial needle aspiration done  Procedure tolerated well

## 2014-03-08 ENCOUNTER — Encounter (HOSPITAL_COMMUNITY): Payer: Self-pay | Admitting: Pulmonary Disease

## 2014-03-08 ENCOUNTER — Ambulatory Visit (HOSPITAL_COMMUNITY)
Admission: RE | Admit: 2014-03-08 | Discharge: 2014-03-08 | Disposition: A | Discharge status: Home / Self Care | Payer: Medicare HMO | Source: Ambulatory Visit | Attending: Hematology & Oncology | Admitting: Hematology & Oncology

## 2014-03-08 DIAGNOSIS — R222 Localized swelling, mass and lump, trunk: Secondary | ICD-10-CM | POA: Diagnosis present

## 2014-03-08 DIAGNOSIS — R911 Solitary pulmonary nodule: Secondary | ICD-10-CM | POA: Insufficient documentation

## 2014-03-08 DIAGNOSIS — R918 Other nonspecific abnormal finding of lung field: Secondary | ICD-10-CM

## 2014-03-08 DIAGNOSIS — E278 Other specified disorders of adrenal gland: Secondary | ICD-10-CM | POA: Insufficient documentation

## 2014-03-08 LAB — GLUCOSE, CAPILLARY: Glucose-Capillary: 125 mg/dL — ABNORMAL HIGH (ref 70–99)

## 2014-03-08 MED ORDER — FLUDEOXYGLUCOSE F - 18 (FDG) INJECTION: 9.7800 | Freq: Once | INTRAVENOUS | Status: AC | PRN

## 2014-03-09 ENCOUNTER — Encounter: Payer: Self-pay | Admitting: Hematology & Oncology

## 2014-03-09 ENCOUNTER — Ambulatory Visit (HOSPITAL_BASED_OUTPATIENT_CLINIC_OR_DEPARTMENT_OTHER): Payer: Medicare HMO | Admitting: Hematology & Oncology

## 2014-03-09 VITALS — BP 100/58 | HR 84 | Temp 97.9°F | Resp 20 | Ht 67.0 in | Wt 195.0 lb

## 2014-03-09 DIAGNOSIS — C3402 Malignant neoplasm of left main bronchus: Secondary | ICD-10-CM

## 2014-03-09 DIAGNOSIS — C3400 Malignant neoplasm of unspecified main bronchus: Secondary | ICD-10-CM

## 2014-03-09 LAB — CULTURE, BAL-QUANTITATIVE W GRAM STAIN
Colony Count: 70000
Special Requests: NORMAL

## 2014-03-09 NOTE — Progress Notes (Signed)
Hematology and Oncology Follow Up Visit  Gregory Farrell [id number] 11-13-1932 80 y.o. 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  Farrell is back for a followup. We have been able to get in a bronchoscopy on him. This was done by Dr. Gwenette Greet. The bronchoscopy report showed that there was severe in erythema and narrowing of the distal left mainstem bronchus. The left lower lobe orifice was absent. Abnormal tissue was noted. Biopsies were done. The pathology report ([id number]) showed a well-differentiated squamous cell carcinoma.  A PET scan was done. This showed intense hypermetabolic left hilar mass. There was a mediastinal lymph node does hypermetabolic along the left mainstem bronchus. The radiologist then commented on a left adrenal gland lesion measuring 2.9 x 2.3 cm. This had mild uptake. Otherwise, everything in the abdomen and pelvis looked okay. The liver looked fine. There is no lymphadenopathy. The bones looked okay.  He came in today. He is feeling a little more tired. Has a little bit more cough. There is no hemoptysis. His appetite has been doing okay. He does watch his blood sugars.  His blood pressure has been a little below side. I told him to stop the Norvasc and the lisinopril.  He's had no headache. There's been no visual issues.     Medications: Current outpatient prescriptions:albuterol (PROVENTIL HFA;VENTOLIN HFA) 108 (90 BASE) MCG/ACT inhaler, Inhale 2 puffs into the lungs every 6 (six) hours as needed for wheezing., Disp: 1 Inhaler, Rfl: 1;  amLODipine (NORVASC) 10 MG tablet, TAKE ONE-HALF TABLET BY MOUTH DAILY , Disp: 45 tablet, Rfl: 1;  aspirin 81 MG tablet, Take 81 mg by mouth daily.  , Disp: , Rfl:  atorvastatin (LIPITOR) 80 MG tablet, TAKE ONE TABLET BY MOUTH ONE TIME DAILY , Disp: 90 tablet, Rfl: 0;  Blood Glucose Monitoring Suppl (ONE TOUCH ULTRA 2)  W/DEVICE KIT, as needed. , Disp: , Rfl: ;  citalopram (CELEXA) 20 MG tablet, TAKE TWO TABLETS BY MOUTH DAILY , Disp: 60 tablet, Rfl: 0 Insulin Detemir (LEVEMIR FLEXTOUCH) 100 UNIT/ML Pen, Inject 60 Units into the skin every morning. May increase by 2 units every 3 days as directed, Disp: 7 pen, Rfl: 1;  Insulin Pen Needle 32G X 6 MM MISC, Use once a day with Levemir, Disp: 50 each, Rfl: 1;  lisinopril (PRINIVIL,ZESTRIL) 5 MG tablet, Take 0.5 tablets (2.5 mg total) by mouth daily., Disp: 45 tablet, Rfl: 1 mometasone-formoterol (DULERA) 200-5 MCG/ACT AERO, Inhale 2 puffs into the lungs 2 (two) times daily., Disp: 1 Inhaler, Rfl: 2;  pantoprazole (PROTONIX) 40 MG tablet, Take 1 tablet (40 mg total) by mouth daily., Disp: 30 tablet, Rfl: 5  Allergies:  Allergies  Allergen Reactions  . Exenatide     REACTION: nausea    Past Medical History, Surgical history, Social history, and Family History were reviewed and updated.  Review of Systems: As above  Physical Exam:  height is 5' 7"  (1.702 m) and weight is 195 lb (88.451 kg). His oral temperature is 97.9 F (36.6 C). His blood pressure is 100/58 and his pulse is 84. His respiration is 20.   :elderly gentleman in no obvious distress. Vital signs are temperature of 98. Pulse 82. Blood pressure 111/62. His weight was 196 pounds. Head and neck exam shows a normocephalic atraumatic skull. There are no ocular or oral lesions. He has no palpable cervical  or supra-clavicular lymph nodes. Lungs are clear. No rales wheezes or rhonchi. Cardiac exam regular rate and rhythm with a normal S1 and S2. He has no murmurs rubs or bruits. Abdomen is soft. He has good bowel sounds. There is no fluid wave. There is no palpable liver or spleen tip. Back exam no tenderness over the spine ribs or hips. Extremities shows no clubbing cyanosis or edema. He has good strength in his legs. He has decent range of motion of his joints. Neurological exam shows no focal neurological  deficits.   Current outpatient prescriptions:albuterol (PROVENTIL HFA;VENTOLIN HFA) 108 (90 BASE) MCG/ACT inhaler, Inhale 2 puffs into the lungs every 6 (six) hours as needed for wheezing., Disp: 1 Inhaler, Rfl: 1;  amLODipine (NORVASC) 10 MG tablet, TAKE ONE-HALF TABLET BY MOUTH DAILY , Disp: 45 tablet, Rfl: 1;  aspirin 81 MG tablet, Take 81 mg by mouth daily.  , Disp: , Rfl:  atorvastatin (LIPITOR) 80 MG tablet, TAKE ONE TABLET BY MOUTH ONE TIME DAILY , Disp: 90 tablet, Rfl: 0;  Blood Glucose Monitoring Suppl (ONE TOUCH ULTRA 2) W/DEVICE KIT, as needed. , Disp: , Rfl: ;  citalopram (CELEXA) 20 MG tablet, TAKE TWO TABLETS BY MOUTH DAILY , Disp: 60 tablet, Rfl: 0 Insulin Detemir (LEVEMIR FLEXTOUCH) 100 UNIT/ML Pen, Inject 60 Units into the skin every morning. May increase by 2 units every 3 days as directed, Disp: 7 pen, Rfl: 1;  Insulin Pen Needle 32G X 6 MM MISC, Use once a day with Levemir, Disp: 50 each, Rfl: 1;  lisinopril (PRINIVIL,ZESTRIL) 5 MG tablet, Take 0.5 tablets (2.5 mg total) by mouth daily., Disp: 45 tablet, Rfl: 1 mometasone-formoterol (DULERA) 200-5 MCG/ACT AERO, Inhale 2 puffs into the lungs 2 (two) times daily., Disp: 1 Inhaler, Rfl: 2;  pantoprazole (PROTONIX) 40 MG tablet, Take 1 tablet (40 mg total) by mouth daily., Disp: 30 tablet, Rfl: 5:  :  Allergies  Allergen Reactions  . Exenatide     REACTION: nausea  :  Family History  Problem Relation Age of Onset  . Coronary artery disease Mother   . Breast cancer Mother   . Diabetes Mother   :  History   Social History  . Marital Status: Married    Spouse Name: N/A    Number of Children: 1  . Years of Education: N/A   Occupational History  . Retired     [employer]   Social History Main Topics  . Smoking status: Former Smoker -- 1.00 packs/day for 20 years    Types: Cigarettes    Start date: 09/09/1982    Quit date: 07/15/2002  . Smokeless tobacco: Never Used     Comment: quit smoking 11 years ago  . Alcohol  Use: No  . Drug Use: Not on file  . Sexual Activity: Not on file   Other Topics Concern  . Not on file   Social History Narrative  . No narrative on file  :       Lab Results  Component Value Date   WBC 9.8 03/01/2014   HGB 13.8 03/01/2014   HCT 42.6 03/01/2014   MCV 92 03/01/2014   PLT 203 03/01/2014     Chemistry      Component Value Date/Time   NA 140 03/01/2014 0826   NA 139 02/11/2014 0923   K 4.6 03/01/2014 0826   K 4.9 02/11/2014 0923   CL 101 03/01/2014 0826   CL 103 02/11/2014 0923   CO2 25 03/01/2014  0826   CO2 27 02/11/2014 0923   BUN 23* 03/01/2014 0826   BUN 19 02/11/2014 0923   CREATININE 1.6* 03/01/2014 0826   CREATININE 1.62* 07/12/2010 1846      Component Value Date/Time   CALCIUM 9.2 03/01/2014 0826   CALCIUM 9.4 02/11/2014 0923   ALKPHOS 105* 03/01/2014 0826   ALKPHOS 115 02/11/2014 0923   AST 15 03/01/2014 0826   AST 12 02/11/2014 0923   ALT 6* 03/01/2014 0826   ALT 9 02/11/2014 0923   BILITOT 0.70 03/01/2014 0826   BILITOT 0.7 02/11/2014 0923         Impression and Plan: Gregory Farrell is 80 year old gentleman. He does look younger. Looks like he has stage IIIB squamous cell carcinoma of the left lung.  The problem every am going to have right now is the radiologist not committing to the left adrenal gland. This is a they're very tough call. We'll have to do an MRI to see if this can differentiate benign for malignant. Hopefully it will. I just do not want to do another biopsy.  I think that he will need both radiation and chemotherapy. I think given that he has complete obstruction of the left lower lobe, combined therapy will be able to get more response so he can open up the lung.  I will speak with Dr.Kinard of radiation oncology. Hopefully he will be able to see Gregory Farrell soon.  Gregory Farrell also will need to have a MRI of the brain.  We will also need to get a Port-A-Cath in the him.  I spent about 45 minutes with he and his family. I showed them the  path reports. I explained to them the problem that we have with his staging. We really need to see if he has metastatic disease or not.  Marland Kitchen He does not have metastatic disease, he is not a surgical candidate from my perspective as he would need to have that left lung removed and I don't think he would survive this.  We will continue to move forward quickly.     Volanda Napoleon, MD 8/26/20155:54 PM

## 2014-03-10 ENCOUNTER — Other Ambulatory Visit: Payer: Self-pay | Admitting: *Deleted

## 2014-03-10 ENCOUNTER — Telehealth: Payer: Self-pay | Admitting: Hematology & Oncology

## 2014-03-10 DIAGNOSIS — R918 Other nonspecific abnormal finding of lung field: Secondary | ICD-10-CM

## 2014-03-10 NOTE — Telephone Encounter (Signed)
Pt aware of 8-29 MRI to NPO 4 hrs.

## 2014-03-10 NOTE — Telephone Encounter (Signed)
Of this list would suggest Breo one puff daily 100

## 2014-03-11 NOTE — Progress Notes (Signed)
Thoracic Location of Tumor / Histology: stage IIIB squamous cell carcinoma of the left lung hilar mass (left main, LLL)   Patient presented in February 23, 2014 with abnormal CT Scan results of "Left hilar mass resulting in complete left lower lobe collapse."  Biopsies revealed:   Diagnosis BRONCHIAL BRUSHING LEFT MAINSTEM, LLL (SPECIMEN 1 OF 3 COLLECTED 03/07/2014) MALIGNANT CELLS CONSISTENT WITH SQUAMOUS CELL CARCINOMA.  Diagnosis BRONCHIAL WASHING LEFT MAINSTEM (SPECIMEN 2 OF 3 COLLECTED 03/07/2014) MALIGNANT CELLS CONSISTENT WITH SQUAMOUS CELL CARCINOMA.  Diagnosis Endobronchial biopsy, LLL / left main - SQUAMOUS CELL CARCINOMA, WELL DIFFERENTIATED.  Tobacco/Marijuana/Snuff/ETOH use: smoked 1 ppd for 20 years, quit in 2004, no snuff or ETOH use  Past/Anticipated interventions by cardiothoracic surgery, if any: 03/07/14 Procedure: VIDEO BRONCHOSCOPY WITHOUT FLUORO;  Surgeon: Kathee Delton, MD;  Location: [facility];  Service: Cardiopulmonary;  Laterality: Bilateral; no further surgery recommended.  Past/Anticipated interventions by medical oncology, if any: combined chemotherapy and radiation.  Signs/Symptoms  Weight changes, if any: no  Respiratory complaints, if any: yes coughing and wheezing that has gotten worse after the biopsy on 03/07/14.  He also has shortness of breath with activity.  Hemoptysis, if any: no  Pain issues, if any:  no  SAFETY ISSUES:  Prior radiation? no  Pacemaker/ICD? no   Possible current pregnancy?no  Is the patient on methotrexate? no  Current Complaints / other details:  PET scan done on 03/08/14.  Patient was not able to have a brain MRI done.  He is here with his wife and daughter.

## 2014-03-12 ENCOUNTER — Ambulatory Visit (HOSPITAL_BASED_OUTPATIENT_CLINIC_OR_DEPARTMENT_OTHER)
Admission: RE | Admit: 2014-03-12 | Discharge: 2014-03-12 | Disposition: A | Discharge status: Home / Self Care | Payer: Medicare HMO | Source: Ambulatory Visit | Attending: Hematology & Oncology | Admitting: Hematology & Oncology

## 2014-03-12 DIAGNOSIS — E279 Disorder of adrenal gland, unspecified: Secondary | ICD-10-CM | POA: Diagnosis not present

## 2014-03-12 DIAGNOSIS — C3402 Malignant neoplasm of left main bronchus: Secondary | ICD-10-CM

## 2014-03-12 DIAGNOSIS — J9819 Other pulmonary collapse: Secondary | ICD-10-CM | POA: Insufficient documentation

## 2014-03-12 DIAGNOSIS — Q618 Other cystic kidney diseases: Secondary | ICD-10-CM | POA: Insufficient documentation

## 2014-03-12 DIAGNOSIS — R948 Abnormal results of function studies of other organs and systems: Secondary | ICD-10-CM | POA: Insufficient documentation

## 2014-03-12 DIAGNOSIS — C3490 Malignant neoplasm of unspecified part of unspecified bronchus or lung: Secondary | ICD-10-CM | POA: Diagnosis present

## 2014-03-12 MED ORDER — GADOBENATE DIMEGLUMINE 529 MG/ML IV SOLN
10.0000 mL | Freq: Once | INTRAVENOUS | Status: AC | PRN
  Administered 2014-03-12: 10 mL via INTRAVENOUS

## 2014-03-14 ENCOUNTER — Ambulatory Visit
Admission: RE | Admit: 2014-03-14 | Discharge: 2014-03-14 | Disposition: A | Discharge status: Home / Self Care | Payer: Medicare HMO | Source: Ambulatory Visit | Attending: Radiation Oncology | Admitting: Radiation Oncology

## 2014-03-14 ENCOUNTER — Encounter: Payer: Self-pay | Admitting: Radiation Oncology

## 2014-03-14 ENCOUNTER — Telehealth: Payer: Self-pay | Admitting: *Deleted

## 2014-03-14 VITALS — BP 110/60 | HR 88 | Temp 98.0°F | Resp 16 | Ht 66.0 in | Wt 194.9 lb

## 2014-03-14 DIAGNOSIS — C3432 Malignant neoplasm of lower lobe, left bronchus or lung: Secondary | ICD-10-CM

## 2014-03-14 DIAGNOSIS — Z510 Encounter for antineoplastic radiation therapy: Secondary | ICD-10-CM | POA: Insufficient documentation

## 2014-03-14 DIAGNOSIS — Z79899 Other long term (current) drug therapy: Secondary | ICD-10-CM | POA: Diagnosis not present

## 2014-03-14 DIAGNOSIS — Z87891 Personal history of nicotine dependence: Secondary | ICD-10-CM | POA: Insufficient documentation

## 2014-03-14 DIAGNOSIS — K219 Gastro-esophageal reflux disease without esophagitis: Secondary | ICD-10-CM | POA: Insufficient documentation

## 2014-03-14 DIAGNOSIS — Z794 Long term (current) use of insulin: Secondary | ICD-10-CM | POA: Diagnosis not present

## 2014-03-14 DIAGNOSIS — Z7982 Long term (current) use of aspirin: Secondary | ICD-10-CM | POA: Insufficient documentation

## 2014-03-14 DIAGNOSIS — R918 Other nonspecific abnormal finding of lung field: Secondary | ICD-10-CM

## 2014-03-14 DIAGNOSIS — J398 Other specified diseases of upper respiratory tract: Secondary | ICD-10-CM | POA: Diagnosis not present

## 2014-03-14 DIAGNOSIS — Z9861 Coronary angioplasty status: Secondary | ICD-10-CM | POA: Insufficient documentation

## 2014-03-14 DIAGNOSIS — C3430 Malignant neoplasm of lower lobe, unspecified bronchus or lung: Secondary | ICD-10-CM | POA: Insufficient documentation

## 2014-03-14 DIAGNOSIS — J449 Chronic obstructive pulmonary disease, unspecified: Secondary | ICD-10-CM | POA: Insufficient documentation

## 2014-03-14 DIAGNOSIS — I10 Essential (primary) hypertension: Secondary | ICD-10-CM | POA: Diagnosis not present

## 2014-03-14 DIAGNOSIS — J988 Other specified respiratory disorders: Secondary | ICD-10-CM

## 2014-03-14 DIAGNOSIS — E119 Type 2 diabetes mellitus without complications: Secondary | ICD-10-CM | POA: Diagnosis not present

## 2014-03-14 DIAGNOSIS — E785 Hyperlipidemia, unspecified: Secondary | ICD-10-CM | POA: Insufficient documentation

## 2014-03-14 DIAGNOSIS — J4489 Other specified chronic obstructive pulmonary disease: Secondary | ICD-10-CM | POA: Insufficient documentation

## 2014-03-14 DIAGNOSIS — J9819 Other pulmonary collapse: Secondary | ICD-10-CM | POA: Insufficient documentation

## 2014-03-14 NOTE — Progress Notes (Signed)
Please see the Nurse Progress Note in the MD Initial Consult Encounter for this patient. 

## 2014-03-14 NOTE — Progress Notes (Signed)
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 male who is seen out courtesy of Dr. Burney Gauze for an opinion concerning radiation therapy as part of management of patient's recently diagnosed squamous cell carcinoma of the left lung. Patient presented with a several month history of worsening dyspnea with exertion. He underwent cardiac evaluation with no immediate cause. A CT scan of the chest showed a left infrahilar mass in the left lower lobe collapse. A PET CT scan was performed which showed a hypermetabolic left infrahilar mass which abuts the left lower lobe bronchus consistent with bronchogenic carcinoma. There was some hypermetabolic activity noted within the atelectatic left lower lobe. In addition there was a hypermetabolic left lower peribronchial lymph node. Hypermetabolic activity was noted in the left adrenal gland but MRI of the abdomen showed no evidence of metastatic disease. The patient was seen by Dr. Danton Sewer and underwent bronchoscopy within the distal left mainstem bronchus there was erythema and cobblestoning. The bronchus was narrowed to the point the scope could not pass into the left upper lobe. The left lower lobe orifice was absent and replaced by soft tissue density with cobblestoning. Endobronchial biopsy of the left mainstem bronchus and left lower lobe area revealed well-differentiated squamous cell carcinoma. Patient is not felt to be a candidate for surgery given his medical history,  age and tumor location.  He is now seen in radiation oncology for consideration for combined modality  therapy.Marland Kitchen  PREVIOUS RADIATION THERAPY: No  PAST MEDICAL HISTORY:  has a past medical history of Hypertension; COPD (chronic obstructive pulmonary disease); GERD (gastroesophageal reflux disease); Renal insufficiency; Hyperlipidemia; CAD (coronary artery disease); B12 deficiency; Memory loss; Vertigo; Diabetes mellitus; and Overweight (01/31/2013).    PAST SURGICAL HISTORY: Past Surgical History  Procedure Laterality Date  . Coronary angioplasty with stent placement    . Inguinal hernia repair  2010  . Video bronchoscopy Bilateral 03/07/2014    Procedure: VIDEO BRONCHOSCOPY WITHOUT FLUORO;  Surgeon: Kathee Delton, MD;  Location: [facility];  Service: Cardiopulmonary;  Laterality: Bilateral;    FAMILY HISTORY: family history includes Breast cancer in his mother; Coronary artery disease in his mother; Diabetes in his mother; Leukemia in his brother.  SOCIAL HISTORY:  reports that he quit smoking about 11 years ago. His smoking use included Cigarettes. He started smoking about 31 years ago. He has a 20 pack-year smoking history. He has never used smokeless tobacco. He reports that he does not drink alcohol.  ALLERGIES: Exenatide  MEDICATIONS:  Current Outpatient Prescriptions  Medication Sig Dispense Refill  . amLODipine (NORVASC) 10 MG tablet TAKE ONE-HALF TABLET BY MOUTH DAILY   45 tablet  1  . aspirin 81 MG tablet Take 81 mg by mouth daily.        Marland Kitchen atorvastatin (LIPITOR) 80 MG tablet TAKE ONE TABLET BY MOUTH ONE TIME DAILY   90 tablet  0  . Blood Glucose Monitoring Suppl (ONE TOUCH ULTRA 2) W/DEVICE KIT as needed.       . budesonide-formoterol (SYMBICORT) 160-4.5 MCG/ACT inhaler Inhale 2 puffs into the lungs 2 (two) times daily.      Marland Kitchen  citalopram (CELEXA) 20 MG tablet TAKE TWO TABLETS BY MOUTH DAILY   60 tablet  0  . Insulin Detemir (LEVEMIR FLEXTOUCH) 100 UNIT/ML Pen Inject 60 Units into the skin every morning. May increase by 2 units every 3 days as directed  7 pen  1  . Insulin Pen  Needle 32G X 6 MM MISC Use once a day with Levemir  50 each  1  . lisinopril (PRINIVIL,ZESTRIL) 5 MG tablet Take 0.5 tablets (2.5 mg total) by mouth daily.  45 tablet  1  . pantoprazole (PROTONIX) 40 MG tablet Take 1 tablet (40 mg total) by mouth daily.  30 tablet  5  . albuterol (PROVENTIL HFA;VENTOLIN HFA) 108 (90 BASE) MCG/ACT inhaler Inhale 2 puffs into the lungs every 6 (six) hours as needed for wheezing.  1 Inhaler  1  . mometasone-formoterol (DULERA) 200-5 MCG/ACT AERO Inhale 2 puffs into the lungs 2 (two) times daily.  1 Inhaler  2   No current facility-administered medications for this encounter.    REVIEW OF SYSTEMS:  A 15 point review of systems is documented in the electronic medical record. This was obtained by the nursing staff. However, I reviewed this with the patient to discuss relevant findings and make appropriate changes.  Chronic dyspnea with exertion, worsening over the past few months. Patient has noticed more problems with wheezing since his bronchoscopy. He denies any hemoptysis or pain within the chest area. He has chronic pain in his right hip and his been recommended for hip replacement but this is pending at this time given the new diagnosis of lung cancer. Patient also has chronic low back pain. He denies any headaches dizziness or blurred vision.   PHYSICAL EXAM:  height is 5' 6"  (1.676 m) and weight is 194 lb 14.4 oz (88.406 kg). His oral temperature is 98 F (36.7 C). His blood pressure is 110/60 and his pulse is 88. His respiration is 16 and oxygen saturation is 96%.   BP 110/60  Pulse 88  Temp(Src) 98 F (36.7 C) (Oral)  Resp 16  Ht 5' 6"  (1.676 m)  Wt 194 lb 14.4 oz (88.406 kg)  BMI 31.47 kg/m2  SpO2 96%  General Appearance:    Alert, cooperative, no distress, appears stated age,  accompanied by wife and daughter on evaluation today   Head:    Normocephalic, without obvious abnormality, atraumatic  Eyes:    PERRL, conjunctiva/corneas clear, EOM's intact,             Nose:   Nares normal, septum midline, mucosa normal, no drainage    or sinus tenderness  Throat:   Lips, mucosa, and tongue normal; dentures in place, gums normal  Neck:   Supple, symmetrical, trachea midline, no adenopathy;       thyroid:  No enlargement/tenderness/nodules; no carotid   bruit or JVD  Back:     Symmetric, no curvature, ROM normal, no CVA tenderness  Lungs:     audible wheezing throughout both lungs more so along the left side   Chest wall:    No tenderness or deformity  Heart:    Regular rate and rhythm, S1 and S2 normal, no murmur, rub   or gallop  Abdomen:     Soft, non-tender, bowel sounds active all four quadrants,    no masses, no organomegaly        Extremities:   Extremities normal, atraumatic, no cyanosis or edema  Pulses:   2+ and symmetric all extremities  Skin:  Skin color, texture, turgor normal, no rashes or lesions  Lymph nodes:   Cervical, supraclavicular, and axillary nodes normal  Neurologic:   Normal strength, sensation and reflexes      throughout     ECOG = 1    1 - Symptomatic but completely ambulatory (Restricted in physically strenuous activity but ambulatory and able to carry out work of a light or sedentary nature. For example, light housework, office work)   LABORATORY DATA:   Lab Results  Component Value Date   WBC 9.8 03/01/2014   HGB 13.8 03/01/2014   HCT 42.6 03/01/2014   MCV 92 03/01/2014   PLT 203 03/01/2014   NEUTROABS 6.8* 03/01/2014   Lab Results  Component Value Date   NA 140 03/01/2014   K 4.6 03/01/2014   CL 101 03/01/2014   CO2 25 03/01/2014   GLUCOSE 158* 03/01/2014   CREATININE 1.6* 03/01/2014   CALCIUM 9.2 03/01/2014      RADIOGRAPHY: Ct Chest W Contrast  02/23/2014   CLINICAL DATA:  Pulmonary nodule, shortness of breath  EXAM: CT CHEST WITH CONTRAST  TECHNIQUE: Multidetector CT imaging of the chest was performed during intravenous contrast administration.  CONTRAST:  15m OMNIPAQUE IOHEXOL 300 MG/ML   SOLN  COMPARISON:  10/21/2012  FINDINGS: The central airways are patent. There is a small area of scarring at the left lung base. There is a small area of scarring in the anterior segment of the right upper lobe. There is left lower lobe collapse with a soft tissue mass surrounding the left lower lobe bronchus and mildly narrowing the left lower lobe pulmonary artery. The mass measures approximately 3.1 x 3.8 cm but is difficult to accurately measure as it blends in with the adjacent atelectatic lung. Small stable 5 mm well-circumscribed right lower lobe pulmonary nodule on image 31/series 3. There is no pleural effusion or pneumothorax.  There are no pathologically enlarged axillary, hilar or mediastinal lymph nodes.  The heart size is normal. There is no pericardial effusion. The thoracic aorta is normal in caliber. There is extensive coronary artery atherosclerosis involving the left main, lad, circumflex and RCA.  Review of bone windows demonstrates no focal lytic or sclerotic lesions. There are syndesmophytes throughout the thoracic spine with ankylosis of the posterior elements as can be seen with ankylosing spondylitis.  Limited non-contrast images of the upper abdomen were obtained. The adrenal glands appear normal. There is a large hiatal hernia. There are multiple subtle nodular hypodense masslike areas in the spleen of uncertain etiology.  IMPRESSION: 1. Left hilar mass resulting in complete left lower lobe collapse. The appearance is most concerning for malignancy. Recommend further evaluation with PET-CT and oncology/pulmonary consultation. These results will be called to the ordering clinician or representative by the Radiologist Assistant, and communication documented in the PACS or zVision Dashboard.  2. There are multiple subtle nodular hypodense masslike areas in the spleen of uncertain etiology. These may reflect true masses versus heterogeneity secondary to phase of enhancement.   Electronically  Signed   By: HKathreen Devoid  On: 02/23/2014 14:30   Mr Abdomen W Wo Contrast  03/13/2014   CLINICAL DATA:  Recently diagnosed lung cancer with left lower lobe collapse and mild hypermetabolism within a left adrenal nodule on PET-CT.  EXAM: MRI ABDOMEN WITHOUT AND WITH CONTRAST  TECHNIQUE: Multiplanar multisequence MR imaging of the abdomen was performed both before and after the administration of intravenous contrast.  CONTRAST:  180mMULTIHANCE GADOBENATE DIMEGLUMINE 529 MG/ML  IV SOLN  COMPARISON:  Chest CT 02/23/2014 and 10/21/2012.  PET-CT 03/08/2014.  FINDINGS: Again demonstrated is a 2.4 x 1.9 cm left adrenal nodule. This nodule demonstrates loss of signal on gradient echo opposed phase images consistent with an adenoma. This nodule is unchanged in size from the 10/21/2012 chest CT. It demonstrates nonspecific T2 signal and enhancement.  There is no right adrenal nodule.  The liver, spleen, pancreas, gallbladder and biliary system appear unremarkable. There are multiple renal cysts bilaterally. Both kidneys demonstrate mild cortical thinning. There is no abdominal adenopathy or ascites. Mild degenerative changes are present throughout the lumbar spine.  Left lower lobe lung collapse again noted.  IMPRESSION: 1. The left adrenal nodule has signal characteristics consistent with an adenoma. The density on CT and stability from prior CT performed 16 months ago are also consistent with an adenoma. A collision lesion accounting for the mildly increased metabolic activity on PET-CT cannot be completely excluded but is doubtful. 2. No evidence of metastatic disease within the upper abdomen. 3. Multiple renal cysts bilaterally. 4. Persistent left lower lobe pulmonary collapse.   Electronically Signed   By: Camie Patience M.D.   On: 03/13/2014 15:02   Nm Pet Image Initial (pi) Skull Base To Thigh  03/08/2014   CLINICAL DATA:  Initial treatment strategy for lung carcinoma. Left hilar mass.  EXAM: NUCLEAR MEDICINE PET  SKULL BASE TO THIGH  TECHNIQUE: 5.8 mCi F-18 FDG was injected intravenously. Full-ring PET imaging was performed from the skull base to thigh after the radiotracer. CT data was obtained and used for attenuation correction and anatomic localization.  FASTING BLOOD GLUCOSE:  Value: 195 mg/dl  COMPARISON:  CT 02/23/2014, for the L 14  FINDINGS: NECK  No hypermetabolic lymph nodes in the neck.  CHEST  There is a hypermetabolic left infrahilar mass with intense metabolic activity (SUV max 17.2). This hilar mass obstructs the lower lobe bronchus with collapse of left lower lobe. Within the collapsed left lower lobe there is a focus of hypermetabolic activity which is intense (SUV max equal 14). No corresponding lesion on the CT exam. There is a hypermetabolic lymph node along the left mainstem bronchus measuring 18 mm (image 76). There are no hypermetabolic contralateral lymph nodes. No hypermetabolic supraclavicular lymph nodes.  Review of the lung parenchyma demonstrates a small mid nodule in the left upper lobe (image 70, series 4 which is seen on comparison CT from 2014.  ABDOMEN/PELVIS  There is enlargement of the left adrenal gland to 29 x 23 mm (image 115 series 4). The lesion measures just above the cut off criteria for a benign adenoma but is relatively low-density weight Hounsfield units equals to simple fluid attenuation. There is a focus of hypermetabolic activity along the medial border of the large left adrenal gland with moderate metabolic activity (SUV max 4.4). This activity is similar to liver activity.  No abnormal focal metabolic activity within the liver. No hypermetabolic abdominal pelvic lymph nodes.  Is low attenuation cyst of the left kidney. Metabolic activity associated with the proximal left hip is felt to degenerative in nature.  SKELETON  No focal hypermetabolic activity to suggest skeletal metastasis.  IMPRESSION: 1. Hypermetabolic left infrahilar mass obstructs the left lower lobe bronchus  consistent with bronchogenic carcinoma. 2. Hypermetabolic focus within the atelectatic left lower lobe. This could represent metabolic activity associated atelectasis but cannot exclude lesion within the lung itself. 3. Hypermetabolic left lower peribronchial lymph node. 4. No contralateral hypermetabolic nodes or supraclavicular nodes.  5. Mild hypermetabolic activity associated with enlarged left adrenal gland. Burtis Junes this to represent a benign adenoma cannot be characterized as such on this exam. Consider an MRI adrenal protocol for confirmation. 6. Stable small left upper lobe pulmonary nodule.   Electronically Signed   By: Suzy Bouchard M.D.   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 The patient has distal left mainstem involvement as well as complete bronchial obstruction of the left lower lobe bronchus resulting in collapse of the left lower lung area. Patient was unable to tolerate his MRI the brain and a head CT scan will be ordered to complete staging workup. Patient would be a good candidate for combined modality therapy with radiation therapy directed at the chest and radiosensitizing chemotherapy. I discussed the treatment course side effects and potential toxicities of radiation therapy in this situation with the patient and his family. Patient appears to understand and wishes to proceed with planned course of treatment.  PLAN: Simulation and planning this week with treatments to begin later this week. Anticipate 6 and half weeks of radiation therapy as part of the patient's management unless head CT scan shows metastasis.  I spent 60 minutes minutes face to face with the patient and more than 50% of that time was spent in counseling and/or coordination of care.   ------------------------------------------------  Blair Promise, PhD, MD

## 2014-03-14 NOTE — Telephone Encounter (Signed)
Message copied by Rico Ala on Mon Mar 14, 2014  9:04 AM ------      Message from: Volanda Napoleon      Created: Sun Mar 13, 2014  9:09 PM       Call - the adrenal lesion is NOT cancer!!!  You actually had this on a CT scan last year!!!  Laurey Arrow ------

## 2014-03-15 ENCOUNTER — Other Ambulatory Visit: Payer: Self-pay | Admitting: Radiation Oncology

## 2014-03-15 ENCOUNTER — Ambulatory Visit
Admission: RE | Admit: 2014-03-15 | Discharge: 2014-03-15 | Disposition: A | Discharge status: Home / Self Care | Payer: Medicare HMO | Source: Ambulatory Visit | Attending: Radiation Oncology | Admitting: Radiation Oncology

## 2014-03-15 DIAGNOSIS — R918 Other nonspecific abnormal finding of lung field: Secondary | ICD-10-CM

## 2014-03-15 DIAGNOSIS — R911 Solitary pulmonary nodule: Secondary | ICD-10-CM

## 2014-03-15 DIAGNOSIS — C3430 Malignant neoplasm of lower lobe, unspecified bronchus or lung: Secondary | ICD-10-CM

## 2014-03-15 DIAGNOSIS — Z510 Encounter for antineoplastic radiation therapy: Secondary | ICD-10-CM | POA: Diagnosis not present

## 2014-03-15 NOTE — Progress Notes (Signed)
  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  Identity was confirmed.  All relevant records and images related to the planned course of therapy were reviewed.  The patient freely provided informed written consent to proceed with treatment after reviewing the details related to the planned course of therapy. The consent form was witnessed and verified by the simulation staff.  Then, the patient was set-up in a stable reproducible  supine position for radiation therapy.  CT images were obtained.  Surface markings were placed.  The CT images were loaded into the planning software.  Then the target and avoidance structures were contoured.  Treatment planning then occurred.  The radiation prescription was entered and confirmed.  Then, I designed and supervised the construction of a total of 1 medically necessary complex treatment devices.  I have requested : 3D Simulation  I have requested a DVH of the following structures: gtv, ptv,lungs, heart, esophagus.  I have ordered:dose calc.  PLAN:  The patient will receive 63 Gy in 35 fractions.  ________________________________   Special treatment procedure note   The patient will be receiving radiosensitizing chemotherapy throughout his chest radiation treatments. Given the increased potential for toxicities as well as the necessity for close monitoring of the patient and bloodwork, this constitutes a special treatment procedure  -----------------------------------  Blair Promise, PhD, MD

## 2014-03-17 ENCOUNTER — Encounter (HOSPITAL_COMMUNITY): Payer: Self-pay

## 2014-03-17 ENCOUNTER — Ambulatory Visit: Payer: Medicare HMO | Admitting: Radiation Oncology

## 2014-03-17 ENCOUNTER — Ambulatory Visit
Admission: RE | Admit: 2014-03-17 | Discharge: 2014-03-17 | Disposition: A | Discharge status: Home / Self Care | Payer: Medicare HMO | Source: Ambulatory Visit | Attending: Radiation Oncology | Admitting: Radiation Oncology

## 2014-03-17 ENCOUNTER — Ambulatory Visit (HOSPITAL_COMMUNITY)
Admission: RE | Admit: 2014-03-17 | Discharge: 2014-03-17 | Disposition: A | Discharge status: Home / Self Care | Payer: Medicare HMO | Source: Ambulatory Visit | Attending: Radiation Oncology | Admitting: Radiation Oncology

## 2014-03-17 ENCOUNTER — Other Ambulatory Visit: Payer: Self-pay | Admitting: Family Medicine

## 2014-03-17 ENCOUNTER — Telehealth: Payer: Self-pay | Admitting: Oncology

## 2014-03-17 ENCOUNTER — Encounter: Payer: Self-pay | Admitting: Radiation Oncology

## 2014-03-17 DIAGNOSIS — C3430 Malignant neoplasm of lower lobe, unspecified bronchus or lung: Secondary | ICD-10-CM | POA: Diagnosis not present

## 2014-03-17 MED ORDER — OXYCODONE-ACETAMINOPHEN 5-325 MG PO TABS
1.0000 | ORAL_TABLET | ORAL | Status: DC | PRN
Start: 2014-03-17 — End: 2014-03-30

## 2014-03-17 MED ORDER — IOHEXOL 300 MG/ML  SOLN
100.0000 mL | Freq: Once | INTRAMUSCULAR | Status: AC | PRN
  Administered 2014-03-17: 100 mL via INTRAVENOUS

## 2014-03-17 NOTE — Telephone Encounter (Signed)
Called Jerod and let him know that his CT of his head from today was good per Dr. Sondra Come.

## 2014-03-17 NOTE — Progress Notes (Signed)
Patient requesting pain medication before treatment, drove himself here, hasn't taken any medications today except his insulin, paged MD, showed him vitals b/p=88/67,P=87,RR=22,T=97.4, audible wheezing also, here for port and treatment  1:44 PM

## 2014-03-17 NOTE — Progress Notes (Signed)
Patient asked if he could have his wife or daughter come drive him home if we gave him pain medication first today, he prefers to start tomorrow and take pain med 1 hour before he gets here,infomred Dr.Kinard, , okay per MD, he is writing rx for patient before d/c home, informed linac #3

## 2014-03-18 ENCOUNTER — Ambulatory Visit
Admission: RE | Admit: 2014-03-18 | Discharge: 2014-03-18 | Disposition: A | Discharge status: Home / Self Care | Payer: Medicare HMO | Source: Ambulatory Visit | Attending: Radiation Oncology | Admitting: Radiation Oncology

## 2014-03-18 ENCOUNTER — Encounter: Payer: Self-pay | Admitting: Radiation Oncology

## 2014-03-18 ENCOUNTER — Ambulatory Visit: Payer: Medicare HMO

## 2014-03-18 DIAGNOSIS — Z510 Encounter for antineoplastic radiation therapy: Secondary | ICD-10-CM | POA: Diagnosis not present

## 2014-03-18 NOTE — Progress Notes (Signed)
Simulation Verification Note  The patient was brought to the treatment unit and placed in the planned treatment position. The clinical setup was verified. Then port films were obtained and uploaded to the radiation oncology medical record software.  The treatment beams were carefully compared against the planned radiation fields. The position location and shape of the radiation fields was reviewed. They targeted volume of tissue appears to be appropriately covered by the radiation beams. Organs at risk appear to be excluded as planned.  Based on my personal review, I approved the simulation verification. The patient's treatment will proceed as planned.  -----------------------------------  Gregory Gibson, MD

## 2014-03-22 ENCOUNTER — Ambulatory Visit
Admission: RE | Admit: 2014-03-22 | Discharge: 2014-03-22 | Disposition: A | Discharge status: Home / Self Care | Payer: Medicare HMO | Source: Ambulatory Visit | Attending: Radiation Oncology | Admitting: Radiation Oncology

## 2014-03-22 ENCOUNTER — Other Ambulatory Visit: Payer: Self-pay | Admitting: *Deleted

## 2014-03-22 ENCOUNTER — Ambulatory Visit: Payer: Medicare HMO

## 2014-03-22 ENCOUNTER — Ambulatory Visit (HOSPITAL_BASED_OUTPATIENT_CLINIC_OR_DEPARTMENT_OTHER): Payer: Medicare HMO

## 2014-03-22 ENCOUNTER — Encounter: Payer: Self-pay | Admitting: Hematology & Oncology

## 2014-03-22 ENCOUNTER — Ambulatory Visit (HOSPITAL_BASED_OUTPATIENT_CLINIC_OR_DEPARTMENT_OTHER): Payer: Medicare HMO | Admitting: Hematology & Oncology

## 2014-03-22 ENCOUNTER — Other Ambulatory Visit (HOSPITAL_BASED_OUTPATIENT_CLINIC_OR_DEPARTMENT_OTHER): Payer: Medicare HMO | Admitting: Lab

## 2014-03-22 ENCOUNTER — Telehealth: Payer: Self-pay | Admitting: Hematology & Oncology

## 2014-03-22 VITALS — BP 197/55 | HR 88 | Temp 97.2°F | Resp 20 | Ht 66.0 in | Wt 191.0 lb

## 2014-03-22 DIAGNOSIS — T17908S Unspecified foreign body in respiratory tract, part unspecified causing other injury, sequela: Principal | ICD-10-CM

## 2014-03-22 DIAGNOSIS — J189 Pneumonia, unspecified organism: Secondary | ICD-10-CM

## 2014-03-22 DIAGNOSIS — C3400 Malignant neoplasm of unspecified main bronchus: Secondary | ICD-10-CM

## 2014-03-22 DIAGNOSIS — R0602 Shortness of breath: Secondary | ICD-10-CM

## 2014-03-22 DIAGNOSIS — IMO0002 Reserved for concepts with insufficient information to code with codable children: Secondary | ICD-10-CM

## 2014-03-22 DIAGNOSIS — R918 Other nonspecific abnormal finding of lung field: Secondary | ICD-10-CM

## 2014-03-22 DIAGNOSIS — C3402 Malignant neoplasm of left main bronchus: Secondary | ICD-10-CM

## 2014-03-22 HISTORY — DX: Malignant neoplasm of unspecified main bronchus: C34.00

## 2014-03-22 LAB — CBC WITH DIFFERENTIAL (CANCER CENTER ONLY)
BASO#: 0 10*3/uL (ref 0.0–0.2)
BASO%: 0.1 % (ref 0.0–2.0)
EOS%: 2.2 % (ref 0.0–7.0)
Eosinophils Absolute: 0.2 10*3/uL (ref 0.0–0.5)
HCT: 41.6 % (ref 38.7–49.9)
HEMOGLOBIN: 13.7 g/dL (ref 13.0–17.1)
LYMPH#: 1.3 10*3/uL (ref 0.9–3.3)
LYMPH%: 11.7 % — ABNORMAL LOW (ref 14.0–48.0)
MCH: 30.2 pg (ref 28.0–33.4)
MCHC: 32.9 g/dL (ref 32.0–35.9)
MCV: 92 fL (ref 82–98)
MONO#: 0.9 10*3/uL (ref 0.1–0.9)
MONO%: 8.2 % (ref 0.0–13.0)
NEUT#: 8.3 10*3/uL — ABNORMAL HIGH (ref 1.5–6.5)
NEUT%: 77.8 % (ref 40.0–80.0)
PLATELETS: 244 10*3/uL (ref 145–400)
RBC: 4.53 10*6/uL (ref 4.20–5.70)
RDW: 14.4 % (ref 11.1–15.7)
WBC: 10.7 10*3/uL — ABNORMAL HIGH (ref 4.0–10.0)

## 2014-03-22 LAB — COMPREHENSIVE METABOLIC PANEL
ALBUMIN: 3.4 g/dL — AB (ref 3.5–5.2)
ALT: 12 U/L (ref 0–53)
AST: 17 U/L (ref 0–37)
Alkaline Phosphatase: 105 U/L (ref 39–117)
BUN: 27 mg/dL — ABNORMAL HIGH (ref 6–23)
CALCIUM: 9.3 mg/dL (ref 8.4–10.5)
CO2: 24 mEq/L (ref 19–32)
Chloride: 102 mEq/L (ref 96–112)
Creatinine, Ser: 2.03 mg/dL — ABNORMAL HIGH (ref 0.50–1.35)
GLUCOSE: 131 mg/dL — AB (ref 70–99)
POTASSIUM: 4.6 meq/L (ref 3.5–5.3)
SODIUM: 137 meq/L (ref 135–145)
TOTAL PROTEIN: 6.9 g/dL (ref 6.0–8.3)
Total Bilirubin: 0.9 mg/dL (ref 0.2–1.2)

## 2014-03-22 MED ORDER — CEFDINIR 300 MG PO CAPS: 300.0000 mg | ORAL_CAPSULE | Freq: Two times a day (BID) | ORAL | Status: DC

## 2014-03-22 MED ORDER — IPRATROPIUM BROMIDE 0.02 % IN SOLN
0.5000 mg | Freq: Once | RESPIRATORY_TRACT | Status: AC
  Administered 2014-03-22: 0.5 mg via RESPIRATORY_TRACT
  Filled 2014-03-22: qty 2.5

## 2014-03-22 MED ORDER — DEXTROSE 5 % IV SOLN
2.0000 g | Freq: Once | INTRAVENOUS | Status: AC
  Administered 2014-03-22: 2 g via INTRAVENOUS
  Filled 2014-03-22: qty 2

## 2014-03-22 MED ORDER — IPRATROPIUM BROMIDE 0.02 % IN SOLN
RESPIRATORY_TRACT | Status: AC
  Filled 2014-03-22: qty 2.5

## 2014-03-22 MED ORDER — ALBUTEROL SULFATE (2.5 MG/3ML) 0.083% IN NEBU
2.5000 mg | INHALATION_SOLUTION | Freq: Once | RESPIRATORY_TRACT | Status: AC
  Administered 2014-03-22: 2.5 mg via RESPIRATORY_TRACT
  Filled 2014-03-22: qty 3

## 2014-03-22 MED ORDER — IPRATROPIUM-ALBUTEROL 0.5-2.5 (3) MG/3ML IN SOLN
3.0000 mL | RESPIRATORY_TRACT | Status: DC
  Filled 2014-03-22: qty 3

## 2014-03-22 NOTE — Patient Instructions (Addendum)
Ceftriaxone injection What is this medicine? CEFTRIAXONE (sef try AX one) is a cephalosporin antibiotic. It is used to treat certain kinds of bacterial infections. It will not work for colds, flu, or other viral infections. This medicine may be used for other purposes; ask your health care provider or pharmacist if you have questions. COMMON BRAND NAME(S): Rocephin What should I tell my health care provider before I take this medicine? They need to know if you have any of these conditions: -any chronic illness -bowel disease, like colitis -both kidney and liver disease -high bilirubin level in newborn patients -an unusual or allergic reaction to ceftriaxone, other cephalosporin or penicillin antibiotics, foods, dyes or preservatives -pregnant or trying to get pregnant -breast-feeding How should I use this medicine? This medicine is injected into a muscle or infused it into a vein. It is usually given in a medical office or clinic. If you are to give this medicine you will be taught how to inject it. Follow instructions carefully. Use your doses at regular intervals. Do not take your medicine more often than directed. Do not skip doses or stop your medicine early even if you feel better. Do not stop taking except on your doctor's advice. Talk to your pediatrician regarding the use of this medicine in children. Special care may be needed. Overdosage: If you think you have taken too much of this medicine contact a poison control center or emergency room at once. NOTE: This medicine is only for you. Do not share this medicine with others. What if I miss a dose? If you miss a dose, take it as soon as you can. If it is almost time for your next dose, take only that dose. Do not take double or extra doses. What may interact with this medicine? Do not take this medicine with any of the following medications: -intravenous calcium This medicine may also interact with the following  medications: -birth control pills This list may not describe all possible interactions. Give your health care provider a list of all the medicines, herbs, non-prescription drugs, or dietary supplements you use. Also tell them if you smoke, drink alcohol, or use illegal drugs. Some items may interact with your medicine. What should I watch for while using this medicine? Tell your doctor or health care professional if your symptoms do not improve or if they get worse. Do not treat diarrhea with over the counter products. Contact your doctor if you have diarrhea that lasts more than 2 days or if it is severe and watery. If you are being treated for a sexually transmitted disease, avoid sexual contact until you have finished your treatment. Having sex can infect your sexual partner. Calcium may bind to this medicine and cause lung or kidney problems. Avoid calcium products while taking this medicine and for 48 hours after taking the last dose of this medicine. What side effects may I notice from receiving this medicine? Side effects that you should report to your doctor or health care professional as soon as possible: -allergic reactions like skin rash, itching or hives, swelling of the face, lips, or tongue -breathing problems -fever, chills -irregular heartbeat -pain when passing urine -seizures -stomach pain, cramps -unusual bleeding, bruising -unusually weak or tired Side effects that usually do not require medical attention (report to your doctor or health care professional if they continue or are bothersome): -diarrhea -dizzy, drowsy -headache -nausea, vomiting -pain, swelling, irritation where injected -stomach upset -sweating This list may not describe all possible  side effects. Call your doctor for medical advice about side effects. You may report side effects to FDA at 1-800-FDA-1088. Where should I keep my medicine? Keep out of the reach of children. Store at room temperature  below 25 degrees C (77 degrees F). Protect from light. Throw away any unused vials after the expiration date. NOTE: This sheet is a summary. It may not cover all possible information. If you have questions about this medicine, talk to your doctor, pharmacist, or health care provider.  2015, Elsevier/Gold Standard. (2013-02-04 15:59:53) Albuterol; Ipratropium solution for inhalation What is this medicine? ALBUTEROL; IPRATROPIUM (al BYOO ter ole; i pra TROE pee um) has two bronchodilators. It helps open up the airways in your lungs to make it easier to breathe. This medicine is used to treat chronic obstructive pulmonary disease (COPD). This medicine may be used for other purposes; ask your health care provider or pharmacist if you have questions. COMMON BRAND NAME(S): DuoNeb What should I tell my health care provider before I take this medicine? They need to know if you have any of the following conditions: -heart disease -high blood pressure -irregular heartbeat -an unusual or allergic reaction to albuterol, ipratropium, atropine, soya protein, soybeans or peanuts, other medicines, foods, dyes, or preservatives -pregnant or trying to get pregnant -breast-feeding How should I use this medicine? This medicine is used in a nebulizer. Nebulizers make a liquid into an aerosol that you breathe in through your mouth or your mouth and nose into your lungs. You will be taught how to use your nebulizer. Follow the directions on your prescription label. Take your medicine at regular intervals. Do not use more often than directed. Talk to your pediatrician regarding the use of this medicine in children. Special care may be needed. Overdosage: If you think you have taken too much of this medicine contact a poison control center or emergency room at once. NOTE: This medicine is only for you. Do not share this medicine with others. What if I miss a dose? If you miss a dose, use it as soon as you can. If it  is almost time for your next dose, use only that dose. Do not use double or extra doses. What may interact with this medicine? Do not take this medicine with any of the following medications: -MAOIs like Carbex, Eldepryl, Marplan, Nardil, and Parnate This medicine may also interact with the following medications: -diuretics -medicines for depression, anxiety, or psychotic disturbances -medicines for irregular heartbeat -medicines for weight loss including some herbal products -methadone -pimozide -some medicines for blood pressure or the heart -sertindole This list may not describe all possible interactions. Give your health care provider a list of all the medicines, herbs, non-prescription drugs, or dietary supplements you use. Also tell them if you smoke, drink alcohol, or use illegal drugs. Some items may interact with your medicine. What should I watch for while using this medicine? Tell your doctor or healthcare professional if your symptoms do not start to get better or if they get worse. If your breathing gets worse while you are using this medicine, call your doctor right away. Do not stop using your medicine unless your doctor tells you to. Your mouth may get dry. Chewing sugarless gum or sucking hard candy, and drinking plenty of water may help. Contact your doctor if the problem does not go away or is severe. You may get dizzy or have blurred vision. Do not drive, use machinery, or do anything that needs mental alertness until  you know how this medicine affects you. Do not stand or sit up quickly, especially if you are an older patient. This reduces the risk of dizzy or fainting spells. What side effects may I notice from receiving this medicine? Side effects that you should report to your doctor or health care professional as soon as possible: -allergic reactions like skin rash, itching or hives, swelling of the face, lips, or tongue -breathing problems -feeling faint or  lightheaded, falls -fever -high blood pressure -irregular heartbeat or chest pain -muscle cramps or weakness -pain, tingling, numbness in the hands or feet -vomiting Side effects that usually do not require medical attention (report to your doctor or health care professional if they continue or are bothersome): -blurred vision -cough -difficulty passing urine -difficulty sleeping -headache -nervousness or trembling -stuffy or runny nose -unusual taste -upset stomach This list may not describe all possible side effects. Call your doctor for medical advice about side effects. You may report side effects to FDA at 1-800-FDA-1088. Where should I keep my medicine? Keep out of the reach of children. Store at a room temperature 2 and 30 degees C (36 to 86 degrees F). Protect from light. Store this medicine in the protective pouch until ready to use. Throw away any unused medicine after the expiration date. NOTE: This sheet is a summary. It may not cover all possible information. If you have questions about this medicine, talk to your doctor, pharmacist, or health care provider.  2015, Elsevier/Gold Standard. (2010-02-27 15:40:09)  Mr. Basel, [place], our scheduler will call you tomorrow and let you know what time your appointment is at [facility] on Thursday.  Also no radiation today, Radiation Oncology will call you to set up your next appointment. If you do not hear from them, please call [phone].  Our number here in [facility] is [phone].

## 2014-03-22 NOTE — Telephone Encounter (Signed)
[facility] MCD - NPR  [id number] PR ALBUTEROL NON-COMP UNIT  [id number] PR IPRATROPIUM BROMIDE NON-COMP  [id number] PR CEFTRIAXONE SODIUM INJECTION  J3490 PR DRUGS UNCLASSIFIED INJECTION J9265 PACLitaxel (TAXOL) 90 mg  J9045 CARBOplatin (PARAPLATIN) 140 mg   Malignant neoplasm of hilus of lung, unspecified laterality - Primary 162.2  I spoke w Gregory Farrell on today.  Ref: [id number]

## 2014-03-23 ENCOUNTER — Ambulatory Visit: Payer: Medicare HMO

## 2014-03-23 ENCOUNTER — Ambulatory Visit: Payer: Medicare HMO | Admitting: Cardiology

## 2014-03-23 ENCOUNTER — Telehealth: Payer: Self-pay | Admitting: *Deleted

## 2014-03-23 ENCOUNTER — Ambulatory Visit: Payer: Medicare HMO | Admitting: Radiation Oncology

## 2014-03-23 ENCOUNTER — Telehealth: Payer: Self-pay | Admitting: Hematology & Oncology

## 2014-03-23 DIAGNOSIS — Z510 Encounter for antineoplastic radiation therapy: Secondary | ICD-10-CM | POA: Diagnosis not present

## 2014-03-23 NOTE — Progress Notes (Signed)
Hematology and Oncology Follow Up Visit  Gregory Farrell [id number] [birth year] 80 y.o. 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 He has seen radiation pathology. He will start radiation therapy this week. Unfortunately, he just cannot raise his arms up over his head lying down on the table. I will speak with Dr. Sondra Come of radiation oncology. He may need to be re- simulated.  Since being bronchoscopy to, he's had a lot of wheezing. He does have some shortness of breath. He's had a decreased appetite. He really is getting weaker. His daughter is very concerned about this.  He's had no hemoptysis. He's had no fever. He's had no nausea vomiting.  I think we will have to get started on treatment with chemotherapy this week.  His blood sugars have been doing okay.  Overall, his performance status is ECOG 2  Medications: Current outpatient prescriptions:albuterol (PROVENTIL HFA;VENTOLIN HFA) 108 (90 BASE) MCG/ACT inhaler, Inhale 2 puffs into the lungs every 6 (six) hours as needed for wheezing., Disp: 1 Inhaler, Rfl: 1;  amLODipine (NORVASC) 10 MG tablet, TAKE ONE-HALF TABLET BY MOUTH DAILY , Disp: 45 tablet, Rfl: 1;  aspirin 81 MG tablet, Take 81 mg by mouth daily.  , Disp: , Rfl:  atorvastatin (LIPITOR) 80 MG tablet, TAKE ONE TABLET BY MOUTH ONE TIME DAILY , Disp: 90 tablet, Rfl: 0;  Blood Glucose Monitoring Suppl (ONE TOUCH ULTRA 2) W/DEVICE KIT, as needed. , Disp: , Rfl: ;  budesonide-formoterol (SYMBICORT) 160-4.5 MCG/ACT inhaler, Inhale 2 puffs into the lungs 2 (two) times daily., Disp: , Rfl: ;  citalopram (CELEXA) 20 MG tablet, TAKE TWO TABLETS BY MOUTH DAILY , Disp: 60 tablet, Rfl: 0 Insulin Detemir (LEVEMIR FLEXTOUCH) 100 UNIT/ML Pen, Inject 60 Units into the skin every morning. May increase by 2 units every  3 days as directed, Disp: 7 pen, Rfl: 1;  Insulin Pen Needle 32G X 6 MM MISC, Use once a day with Levemir, Disp: 50 each, Rfl: 1;  lisinopril (PRINIVIL,ZESTRIL) 5 MG tablet, Take 0.5 tablets (2.5 mg total) by mouth daily., Disp: 45 tablet, Rfl: 1 mometasone-formoterol (DULERA) 200-5 MCG/ACT AERO, Inhale 2 puffs into the lungs 2 (two) times daily., Disp: 1 Inhaler, Rfl: 2;  oxyCODONE-acetaminophen (PERCOCET/ROXICET) 5-325 MG per tablet, Take 1 tablet by mouth every 4 (four) hours as needed for severe pain., Disp: 40 tablet, Rfl: 0;  pantoprazole (PROTONIX) 40 MG tablet, TAKE ONE TABLET BY MOUTH ONE TIME DAILY , Disp: 30 tablet, Rfl: 4 cefdinir (OMNICEF) 300 MG capsule, Take 1 capsule (300 mg total) by mouth 2 (two) times daily., Disp: 20 capsule, Rfl: 2 Current facility-administered medications:ipratropium-albuterol (DUONEB) 0.5-2.5 (3) MG/3ML nebulizer solution 3 mL, 3 mL, Nebulization, Q4H, Volanda Napoleon, MD  Allergies:  Allergies  Allergen Reactions  . Exenatide     REACTION: nausea    Past Medical History, Surgical history, Social history, and Family History were reviewed and updated.  Review of Systems: As above  Physical Exam:  height is _0  (1.676 m) and weight is 191 lb (86.637 kg). His oral temperature is 97.2 F (36.2 C). His blood pressure is 197/55 and his pulse is 88. His respiration is 20.   Elderly white gentleman. He has some audible wheezes. His head and exam shows no oral lesions. He has no  scleral icterus. There is no adenopathy in his neck. Lungs are with wheezes bilaterally. His more so on the left side. He has decent air movement. There maybe some slight decreased air movement on the left side.. Cardiac exam is tachycardic but regular. There is no murmurs, rubs or bruits. Abdomen soft. Has good bowel sounds. There is no fluid wave. There is no palpable liver or spleen tip. Back exam no tenderness over the spine, ribs or hips. Extremities shows no clubbing, cyanosis or  edema. Neurological exam shows no focal neurological deficits. Skin exam shows some scattered ecchymoses.  Lab Results  Component Value Date   WBC 10.7* 03/22/2014   HGB 13.7 03/22/2014   HCT 41.6 03/22/2014   MCV 92 03/22/2014   PLT 244 03/22/2014     Chemistry      Component Value Date/Time   NA 137 03/22/2014 1109   NA 140 03/01/2014 0826   K 4.6 03/22/2014 1109   K 4.6 03/01/2014 0826   CL 102 03/22/2014 1109   CL 101 03/01/2014 0826   CO2 24 03/22/2014 1109   CO2 25 03/01/2014 0826   BUN 27* 03/22/2014 1109   BUN 23* 03/01/2014 0826   CREATININE 2.03* 03/22/2014 1109   CREATININE 1.6* 03/01/2014 0826      Component Value Date/Time   CALCIUM 9.3 03/22/2014 1109   CALCIUM 9.2 03/01/2014 0826   ALKPHOS 105 03/22/2014 1109   ALKPHOS 105* 03/01/2014 0826   AST 17 03/22/2014 1109   AST 15 03/01/2014 0826   ALT 12 03/22/2014 1109   ALT 6* 03/01/2014 0826   BILITOT 0.9 03/22/2014 1109   BILITOT 0.70 03/01/2014 0826         Impression and Plan: Gregory Farrell is 80 year old gentleman with locally advanced, inoperable non-small cell lung cancer of the left lung. This is squamous cell carcinoma.  His problem seems to be pulmonary right now. I will go ahead and give him a nebulizer in the office.  I worry about the possibility of post obstructive pneumonia. I will give him a dose of IV antibiotics with Rocephin. I will then put him on oral antibiotics with Omnicef.  I want to start him on low-dose chemotherapy with the radiation. I think that carboplatinum/Taxol would be appropriate. I would do this weekly.  I think this would be better tolerated for him. Hopefully he will lose whatsoever here. I don't think his blood counts go down too much. I don't think that he will have significant nausea or vomiting. I would like to think that he would not be transfused. I told he and his daughter that the whole point of low-dose chemotherapy is to improve the radiation effect. I told them that the radiation therapy we will do  most of the work and try to eradicate the cancer.  He will need to have a Port-A-Cath placed. I get this set up for next week. However, we really have to get started with treatment this week.  I spoke with Dr. Sondra Come. He will have to re-simulate him for the radiation. He will try to get his radiation started on Thursday.  Spent about 45 minutes with he and his daughter. He agrees with taking treatment. He understands the side effects. I answered all their questions.   Volanda Napoleon, MD 9/9/20156:24 AM

## 2014-03-23 NOTE — Telephone Encounter (Signed)
Per [facility] office I have scheduled appts for patient. They will call the patient.

## 2014-03-23 NOTE — Telephone Encounter (Signed)
Per Md order for patient to be sch at the [facility] in [place] for Chemo.  I called Sharyn Lull and she sch the chemo education class and Chemo tx for 03/24/14.  I called and gave patient the apt dates and times for the class and chemo tx.  Patient is aware of where to go and the apt times

## 2014-03-24 ENCOUNTER — Ambulatory Visit
Admission: RE | Admit: 2014-03-24 | Discharge: 2014-03-24 | Disposition: A | Discharge status: Home / Self Care | Payer: Medicare HMO | Source: Ambulatory Visit | Attending: Radiation Oncology | Admitting: Radiation Oncology

## 2014-03-24 ENCOUNTER — Other Ambulatory Visit: Payer: Medicare HMO

## 2014-03-24 ENCOUNTER — Other Ambulatory Visit: Payer: Self-pay | Admitting: Medical Oncology

## 2014-03-24 ENCOUNTER — Ambulatory Visit: Payer: Medicare HMO

## 2014-03-24 ENCOUNTER — Ambulatory Visit (HOSPITAL_BASED_OUTPATIENT_CLINIC_OR_DEPARTMENT_OTHER): Payer: Medicare HMO

## 2014-03-24 VITALS — BP 109/57 | HR 76 | Temp 97.1°F | Resp 18

## 2014-03-24 DIAGNOSIS — R918 Other nonspecific abnormal finding of lung field: Secondary | ICD-10-CM

## 2014-03-24 DIAGNOSIS — Z5111 Encounter for antineoplastic chemotherapy: Secondary | ICD-10-CM

## 2014-03-24 DIAGNOSIS — C3400 Malignant neoplasm of unspecified main bronchus: Secondary | ICD-10-CM

## 2014-03-24 DIAGNOSIS — Z510 Encounter for antineoplastic radiation therapy: Secondary | ICD-10-CM | POA: Diagnosis not present

## 2014-03-24 MED ORDER — PACLITAXEL CHEMO INJECTION 300 MG/50ML
45.0000 mg/m2 | Freq: Once | INTRAVENOUS | Status: AC
  Administered 2014-03-24: 90 mg via INTRAVENOUS
  Filled 2014-03-24: qty 15

## 2014-03-24 MED ORDER — PROCHLORPERAZINE MALEATE 10 MG PO TABS: 10.0000 mg | ORAL_TABLET | Freq: Four times a day (QID) | ORAL | Status: DC | PRN

## 2014-03-24 MED ORDER — SODIUM CHLORIDE 0.9 % IV SOLN
120.0000 mg | Freq: Once | INTRAVENOUS | Status: AC
  Administered 2014-03-24: 120 mg via INTRAVENOUS
  Filled 2014-03-24: qty 12

## 2014-03-24 MED ORDER — ONDANSETRON 16 MG/50ML IVPB (CHCC)
16.0000 mg | Freq: Once | INTRAVENOUS | Status: AC
  Administered 2014-03-24: 16 mg via INTRAVENOUS

## 2014-03-24 MED ORDER — SODIUM CHLORIDE 0.9 % IV SOLN
INTRAVENOUS | Status: AC
  Administered 2014-03-24: 13:00:00 via INTRAVENOUS

## 2014-03-24 MED ORDER — DIPHENHYDRAMINE HCL 50 MG/ML IJ SOLN
50.0000 mg | Freq: Once | INTRAMUSCULAR | Status: AC
  Administered 2014-03-24: 50 mg via INTRAVENOUS

## 2014-03-24 MED ORDER — ONDANSETRON HCL 8 MG PO TABS: ORAL_TABLET | ORAL | Status: DC

## 2014-03-24 MED ORDER — LORAZEPAM 0.5 MG PO TABS: ORAL_TABLET | ORAL | Status: DC

## 2014-03-24 MED ORDER — SODIUM CHLORIDE 0.9 % IV SOLN
Freq: Once | INTRAVENOUS | Status: AC
  Administered 2014-03-24: 12:00:00 via INTRAVENOUS

## 2014-03-24 MED ORDER — DIPHENHYDRAMINE HCL 50 MG/ML IJ SOLN
INTRAMUSCULAR | Status: AC
  Filled 2014-03-24: qty 1

## 2014-03-24 MED ORDER — ONDANSETRON 16 MG/50ML IVPB (CHCC)
INTRAVENOUS | Status: AC
  Filled 2014-03-24: qty 16

## 2014-03-24 MED ORDER — DEXAMETHASONE SODIUM PHOSPHATE 20 MG/5ML IJ SOLN
20.0000 mg | Freq: Once | INTRAMUSCULAR | Status: AC
  Administered 2014-03-24: 20 mg via INTRAVENOUS

## 2014-03-24 MED ORDER — FAMOTIDINE IN NACL 20-0.9 MG/50ML-% IV SOLN
20.0000 mg | Freq: Once | INTRAVENOUS | Status: AC
  Administered 2014-03-24: 20 mg via INTRAVENOUS

## 2014-03-24 MED ORDER — DEXAMETHASONE SODIUM PHOSPHATE 20 MG/5ML IJ SOLN
INTRAMUSCULAR | Status: AC
  Filled 2014-03-24: qty 5

## 2014-03-24 MED ORDER — FAMOTIDINE IN NACL 20-0.9 MG/50ML-% IV SOLN
INTRAVENOUS | Status: AC
  Filled 2014-03-24: qty 50

## 2014-03-24 NOTE — Patient Instructions (Signed)
[facility] Discharge Instructions for Patients Receiving Chemotherapy  Today you received the following chemotherapy agents Taxol/Carboplatin To help prevent nausea and vomiting after your treatment, we encourage you to take your nausea medication as prescribed.   If you develop nausea and vomiting that is not controlled by your nausea medication, call the clinic.   BELOW ARE SYMPTOMS THAT SHOULD BE REPORTED IMMEDIATELY:  *FEVER GREATER THAN 100.5 F  *CHILLS WITH OR WITHOUT FEVER  NAUSEA AND VOMITING THAT IS NOT CONTROLLED WITH YOUR NAUSEA MEDICATION  *UNUSUAL SHORTNESS OF BREATH  *UNUSUAL BRUISING OR BLEEDING  TENDERNESS IN MOUTH AND THROAT WITH OR WITHOUT PRESENCE OF ULCERS  *URINARY PROBLEMS  *BOWEL PROBLEMS  UNUSUAL RASH Items with * indicate a potential emergency and should be followed up as soon as possible.  Feel free to call the clinic you have any questions or concerns. The clinic phone number is [phone].    Paclitaxel injection (Taxol) What is this medicine? PACLITAXEL (PAK li TAX el) is a chemotherapy drug. It targets fast dividing cells, like cancer cells, and causes these cells to die. This medicine is used to treat ovarian cancer, breast cancer, and other cancers. This medicine may be used for other purposes; ask your health care provider or pharmacist if you have questions. COMMON BRAND NAME(S): Onxol, Taxol What should I tell my health care provider before I take this medicine? They need to know if you have any of these conditions: -blood disorders -irregular heartbeat -infection (especially a virus infection such as chickenpox, cold sores, or herpes) -liver disease -previous or ongoing radiation therapy -an unusual or allergic reaction to paclitaxel, alcohol, polyoxyethylated castor oil, other chemotherapy agents, other medicines, foods, dyes, or preservatives -pregnant or trying to get pregnant -breast-feeding How should I use  this medicine? This drug is given as an infusion into a vein. It is administered in a hospital or clinic by a specially trained health care professional. Talk to your pediatrician regarding the use of this medicine in children. Special care may be needed. Overdosage: If you think you have taken too much of this medicine contact a poison control center or emergency room at once. NOTE: This medicine is only for you. Do not share this medicine with others. What if I miss a dose? It is important not to miss your dose. Call your doctor or health care professional if you are unable to keep an appointment. What may interact with this medicine? Do not take this medicine with any of the following medications: -disulfiram -metronidazole This medicine may also interact with the following medications: -cyclosporine -diazepam -ketoconazole -medicines to increase blood counts like filgrastim, pegfilgrastim, sargramostim -other chemotherapy drugs like cisplatin, doxorubicin, epirubicin, etoposide, teniposide, vincristine -quinidine -testosterone -vaccines -verapamil Talk to your doctor or health care professional before taking any of these medicines: -acetaminophen -aspirin -ibuprofen -ketoprofen -naproxen This list may not describe all possible interactions. Give your health care provider a list of all the medicines, herbs, non-prescription drugs, or dietary supplements you use. Also tell them if you smoke, drink alcohol, or use illegal drugs. Some items may interact with your medicine. What should I watch for while using this medicine? Your condition will be monitored carefully while you are receiving this medicine. You will need important blood work done while you are taking this medicine. This drug may make you feel generally unwell. This is not uncommon, as chemotherapy can affect healthy cells as well as cancer cells. Report any side effects. Continue your course of  treatment even though you  feel ill unless your doctor tells you to stop. In some cases, you may be given additional medicines to help with side effects. Follow all directions for their use. Call your doctor or health care professional for advice if you get a fever, chills or sore throat, or other symptoms of a cold or flu. Do not treat yourself. This drug decreases your body's ability to fight infections. Try to avoid being around people who are sick. This medicine may increase your risk to bruise or bleed. Call your doctor or health care professional if you notice any unusual bleeding. Be careful brushing and flossing your teeth or using a toothpick because you may get an infection or bleed more easily. If you have any dental work done, tell your dentist you are receiving this medicine. Avoid taking products that contain aspirin, acetaminophen, ibuprofen, naproxen, or ketoprofen unless instructed by your doctor. These medicines may hide a fever. Do not become pregnant while taking this medicine. Women should inform their doctor if they wish to become pregnant or think they might be pregnant. There is a potential for serious side effects to an unborn child. Talk to your health care professional or pharmacist for more information. Do not breast-feed an infant while taking this medicine. Men are advised not to father a child while receiving this medicine. What side effects may I notice from receiving this medicine? Side effects that you should report to your doctor or health care professional as soon as possible: -allergic reactions like skin rash, itching or hives, swelling of the face, lips, or tongue -low blood counts - This drug may decrease the number of white blood cells, red blood cells and platelets. You may be at increased risk for infections and bleeding. -signs of infection - fever or chills, cough, sore throat, pain or difficulty passing urine -signs of decreased platelets or bleeding - bruising, pinpoint red spots on  the skin, black, tarry stools, nosebleeds -signs of decreased red blood cells - unusually weak or tired, fainting spells, lightheadedness -breathing problems -chest pain -high or low blood pressure -mouth sores -nausea and vomiting -pain, swelling, redness or irritation at the injection site -pain, tingling, numbness in the hands or feet -slow or irregular heartbeat -swelling of the ankle, feet, hands Side effects that usually do not require medical attention (report to your doctor or health care professional if they continue or are bothersome): -bone pain -complete hair loss including hair on your head, underarms, pubic hair, eyebrows, and eyelashes -changes in the color of fingernails -diarrhea -loosening of the fingernails -loss of appetite -muscle or joint pain -red flush to skin -sweating This list may not describe all possible side effects. Call your doctor for medical advice about side effects. You may report side effects to FDA at 1-800-FDA-1088. Where should I keep my medicine? This drug is given in a hospital or clinic and will not be stored at home. NOTE: This sheet is a summary. It may not cover all possible information. If you have questions about this medicine, talk to your doctor, pharmacist, or health care provider.  2015, Elsevier/Gold Standard. (2012-08-24 16:41:21)   Carboplatin injection What is this medicine? CARBOPLATIN (KAR boe pla tin) is a chemotherapy drug. It targets fast dividing cells, like cancer cells, and causes these cells to die. This medicine is used to treat ovarian cancer and many other cancers. This medicine may be used for other purposes; ask your health care provider or pharmacist if you have questions.  COMMON BRAND NAME(S): Paraplatin What should I tell my health care provider before I take this medicine? They need to know if you have any of these conditions: -blood disorders -hearing problems -kidney disease -recent or ongoing radiation  therapy -an unusual or allergic reaction to carboplatin, cisplatin, other chemotherapy, other medicines, foods, dyes, or preservatives -pregnant or trying to get pregnant -breast-feeding How should I use this medicine? This drug is usually given as an infusion into a vein. It is administered in a hospital or clinic by a specially trained health care professional. Talk to your pediatrician regarding the use of this medicine in children. Special care may be needed. Overdosage: If you think you have taken too much of this medicine contact a poison control center or emergency room at once. NOTE: This medicine is only for you. Do not share this medicine with others. What if I miss a dose? It is important not to miss a dose. Call your doctor or health care professional if you are unable to keep an appointment. What may interact with this medicine? -medicines for seizures -medicines to increase blood counts like filgrastim, pegfilgrastim, sargramostim -some antibiotics like amikacin, gentamicin, neomycin, streptomycin, tobramycin -vaccines Talk to your doctor or health care professional before taking any of these medicines: -acetaminophen -aspirin -ibuprofen -ketoprofen -naproxen This list may not describe all possible interactions. Give your health care provider a list of all the medicines, herbs, non-prescription drugs, or dietary supplements you use. Also tell them if you smoke, drink alcohol, or use illegal drugs. Some items may interact with your medicine. What should I watch for while using this medicine? Your condition will be monitored carefully while you are receiving this medicine. You will need important blood work done while you are taking this medicine. This drug may make you feel generally unwell. This is not uncommon, as chemotherapy can affect healthy cells as well as cancer cells. Report any side effects. Continue your course of treatment even though you feel ill unless your doctor  tells you to stop. In some cases, you may be given additional medicines to help with side effects. Follow all directions for their use. Call your doctor or health care professional for advice if you get a fever, chills or sore throat, or other symptoms of a cold or flu. Do not treat yourself. This drug decreases your body's ability to fight infections. Try to avoid being around people who are sick. This medicine may increase your risk to bruise or bleed. Call your doctor or health care professional if you notice any unusual bleeding. Be careful brushing and flossing your teeth or using a toothpick because you may get an infection or bleed more easily. If you have any dental work done, tell your dentist you are receiving this medicine. Avoid taking products that contain aspirin, acetaminophen, ibuprofen, naproxen, or ketoprofen unless instructed by your doctor. These medicines may hide a fever. Do not become pregnant while taking this medicine. Women should inform their doctor if they wish to become pregnant or think they might be pregnant. There is a potential for serious side effects to an unborn child. Talk to your health care professional or pharmacist for more information. Do not breast-feed an infant while taking this medicine. What side effects may I notice from receiving this medicine? Side effects that you should report to your doctor or health care professional as soon as possible: -allergic reactions like skin rash, itching or hives, swelling of the face, lips, or tongue -signs of infection -  fever or chills, cough, sore throat, pain or difficulty passing urine -signs of decreased platelets or bleeding - bruising, pinpoint red spots on the skin, black, tarry stools, nosebleeds -signs of decreased red blood cells - unusually weak or tired, fainting spells, lightheadedness -breathing problems -changes in hearing -changes in vision -chest pain -high blood pressure -low blood counts - This  drug may decrease the number of white blood cells, red blood cells and platelets. You may be at increased risk for infections and bleeding. -nausea and vomiting -pain, swelling, redness or irritation at the injection site -pain, tingling, numbness in the hands or feet -problems with balance, talking, walking -trouble passing urine or change in the amount of urine Side effects that usually do not require medical attention (report to your doctor or health care professional if they continue or are bothersome): -hair loss -loss of appetite -metallic taste in the mouth or changes in taste This list may not describe all possible side effects. Call your doctor for medical advice about side effects. You may report side effects to FDA at 1-800-FDA-1088. Where should I keep my medicine? This drug is given in a hospital or clinic and will not be stored at home. NOTE: This sheet is a summary. It may not cover all possible information. If you have questions about this medicine, talk to your doctor, pharmacist, or health care provider.  2015, [employer]vier/Gold Standard. (2007-10-06 14:38:05)

## 2014-03-24 NOTE — Progress Notes (Signed)
Ok to treat with SCR 2.03 per Dr. Lindi Adie. Give 500 mL normal saline and inform pharmacy to adjust Carboplatin dose based off of the current SCR per Dr. Lindi Adie.

## 2014-03-24 NOTE — Progress Notes (Signed)
  Radiation Oncology         ([phone]) [phone] ________________________________  Name: Gregory Farrell MRN: [id number]  Date: 03/24/2014  DOB: 11/13/1932  Simulation Verification Note  Status: outpatient  NARRATIVE: The patient was brought to the treatment unit and placed in the planned treatment position. The clinical setup was verified. Then port films were obtained and uploaded to the radiation oncology medical record software.  The treatment beams were carefully compared against the planned radiation fields. The position location and shape of the radiation fields was reviewed. They targeted volume of tissue appears to be appropriately covered by the radiation beams. Organs at risk appear to be excluded as planned.  Based on my personal review, I approved the simulation verification. The patient's treatment will proceed as planned.  -----------------------------------  Blair Promise, PhD, MD

## 2014-03-25 ENCOUNTER — Ambulatory Visit
Admission: RE | Admit: 2014-03-25 | Discharge: 2014-03-25 | Disposition: A | Discharge status: Home / Self Care | Payer: Medicare HMO | Source: Ambulatory Visit | Attending: Radiation Oncology | Admitting: Radiation Oncology

## 2014-03-25 ENCOUNTER — Telehealth: Payer: Self-pay | Admitting: *Deleted

## 2014-03-25 ENCOUNTER — Ambulatory Visit: Payer: Medicare HMO

## 2014-03-25 DIAGNOSIS — Z510 Encounter for antineoplastic radiation therapy: Secondary | ICD-10-CM | POA: Diagnosis not present

## 2014-03-25 NOTE — Telephone Encounter (Signed)
Spoke with patient. States he is "doing very well, feel better than I did before chemo". Denies N/V or diarrhea. Eating good. To call if has any questions or concerns

## 2014-03-25 NOTE — Telephone Encounter (Signed)
Message copied by Patton Salles on Fri Mar 25, 2014 11:34 AM ------      Message from: Perlie Gold      Created: Thu Mar 24, 2014  3:09 PM      Regarding: Chemo Follow  up call      Contact: [phone]       First time Taxol/Carbo. Dr. Marin Olp patient. Please call.            Thanks,             Barnetta Chapel, RN ------

## 2014-03-25 NOTE — Telephone Encounter (Signed)
Message copied by Patton Salles on Fri Mar 25, 2014  1:28 PM ------      Message from: Perlie Gold      Created: Thu Mar 24, 2014  3:09 PM      Regarding: Chemo Follow  up call      Contact: [phone]       First time Taxol/Carbo. Dr. Marin Olp patient. Please call.            Thanks,             Barnetta Chapel, RN ------

## 2014-03-28 ENCOUNTER — Other Ambulatory Visit: Payer: Self-pay | Admitting: Radiology

## 2014-03-28 ENCOUNTER — Ambulatory Visit
Admission: RE | Admit: 2014-03-28 | Discharge: 2014-03-28 | Disposition: A | Discharge status: Home / Self Care | Payer: Medicare HMO | Source: Ambulatory Visit | Attending: Radiation Oncology | Admitting: Radiation Oncology

## 2014-03-28 ENCOUNTER — Ambulatory Visit: Payer: Medicare HMO

## 2014-03-28 DIAGNOSIS — Z510 Encounter for antineoplastic radiation therapy: Secondary | ICD-10-CM | POA: Diagnosis not present

## 2014-03-29 ENCOUNTER — Encounter: Payer: Self-pay | Admitting: Radiation Oncology

## 2014-03-29 ENCOUNTER — Ambulatory Visit: Payer: Medicare HMO

## 2014-03-29 ENCOUNTER — Ambulatory Visit
Admission: RE | Admit: 2014-03-29 | Discharge: 2014-03-29 | Disposition: A | Discharge status: Home / Self Care | Payer: Medicare HMO | Source: Ambulatory Visit | Attending: Hematology & Oncology | Admitting: Hematology & Oncology

## 2014-03-29 ENCOUNTER — Ambulatory Visit
Admission: RE | Admit: 2014-03-29 | Discharge: 2014-03-29 | Disposition: A | Discharge status: Home / Self Care | Payer: Medicare HMO | Source: Ambulatory Visit | Attending: Radiation Oncology | Admitting: Radiation Oncology

## 2014-03-29 ENCOUNTER — Other Ambulatory Visit: Payer: Self-pay | Admitting: Oncology

## 2014-03-29 VITALS — BP 73/47 | HR 141 | Temp 97.7°F | Resp 16 | Ht 66.0 in | Wt 187.6 lb

## 2014-03-29 DIAGNOSIS — Z510 Encounter for antineoplastic radiation therapy: Secondary | ICD-10-CM | POA: Diagnosis not present

## 2014-03-29 DIAGNOSIS — C3402 Malignant neoplasm of left main bronchus: Secondary | ICD-10-CM

## 2014-03-29 LAB — CBC WITH DIFFERENTIAL/PLATELET
BASO%: 0.4 % (ref 0.0–2.0)
Basophils Absolute: 0 10*3/uL (ref 0.0–0.1)
EOS ABS: 0.2 10*3/uL (ref 0.0–0.5)
EOS%: 2 % (ref 0.0–7.0)
HEMATOCRIT: 41.4 % (ref 38.4–49.9)
HGB: 13.4 g/dL (ref 13.0–17.1)
LYMPH#: 0.7 10*3/uL — AB (ref 0.9–3.3)
LYMPH%: 6.3 % — AB (ref 14.0–49.0)
MCH: 29.2 pg (ref 27.2–33.4)
MCHC: 32.4 g/dL (ref 32.0–36.0)
MCV: 90.2 fL (ref 79.3–98.0)
MONO#: 0.3 10*3/uL (ref 0.1–0.9)
MONO%: 2.5 % (ref 0.0–14.0)
NEUT#: 9.7 10*3/uL — ABNORMAL HIGH (ref 1.5–6.5)
NEUT%: 88.8 % — AB (ref 39.0–75.0)
Platelets: 255 10*3/uL (ref 140–400)
RBC: 4.59 10*6/uL (ref 4.20–5.82)
RDW: 15.4 % — ABNORMAL HIGH (ref 11.0–14.6)
WBC: 10.9 10*3/uL — AB (ref 4.0–10.3)

## 2014-03-29 LAB — BASIC METABOLIC PANEL (CC13)
Anion Gap: 13 mEq/L — ABNORMAL HIGH (ref 3–11)
BUN: 36.3 mg/dL — ABNORMAL HIGH (ref 7.0–26.0)
CALCIUM: 9.4 mg/dL (ref 8.4–10.4)
CHLORIDE: 102 meq/L (ref 98–109)
CO2: 19 meq/L — AB (ref 22–29)
Creatinine: 2 mg/dL — ABNORMAL HIGH (ref 0.7–1.3)
GLUCOSE: 177 mg/dL — AB (ref 70–140)
Potassium: 4.8 mEq/L (ref 3.5–5.1)
Sodium: 134 mEq/L — ABNORMAL LOW (ref 136–145)

## 2014-03-29 MED ORDER — SODIUM CHLORIDE 0.9 % IV SOLN
1000.0000 mL | INTRAVENOUS | Status: AC
  Administered 2014-03-29: 1000 mL via INTRAVENOUS

## 2014-03-29 MED ORDER — RADIAPLEXRX EX GEL
Freq: Once | CUTANEOUS | Status: AC
  Administered 2014-03-29: 14:00:00 via TOPICAL

## 2014-03-29 NOTE — Patient Instructions (Signed)
Dehydration, Adult Dehydration is when you lose more fluids from the body than you take in. Vital organs like the kidneys, brain, and heart cannot function without a proper amount of fluids and salt. Any loss of fluids from the body can cause dehydration.  CAUSES   Vomiting.  Diarrhea.  Excessive sweating.  Excessive urine output.  Fever. SYMPTOMS  Mild dehydration  Thirst.  Dry lips.  Slightly dry mouth. Moderate dehydration  Very dry mouth.  Sunken eyes.  Skin does not bounce back quickly when lightly pinched and released.  Dark urine and decreased urine production.  Decreased tear production.  Headache. Severe dehydration  Very dry mouth.  Extreme thirst.  Rapid, weak pulse (more than 100 beats per minute at rest).  Cold hands and feet.  Not able to sweat in spite of heat and temperature.  Rapid breathing.  Blue lips.  Confusion and lethargy.  Difficulty being awakened.  Minimal urine production.  No tears. DIAGNOSIS  Your caregiver will diagnose dehydration based on your symptoms and your exam. Blood and urine tests will help confirm the diagnosis. The diagnostic evaluation should also identify the cause of dehydration. TREATMENT  Treatment of mild or moderate dehydration can often be done at home by increasing the amount of fluids that you drink. It is best to drink small amounts of fluid more often. Drinking too much at one time can make vomiting worse. Refer to the home care instructions below. Severe dehydration needs to be treated at the hospital where you will probably be given intravenous (IV) fluids that contain water and electrolytes. HOME CARE INSTRUCTIONS   Ask your caregiver about specific rehydration instructions.  Drink enough fluids to keep your urine clear or pale yellow.  Drink small amounts frequently if you have nausea and vomiting.  Eat as you normally do.  Avoid:  Foods or drinks high in sugar.  Carbonated  drinks.  Juice.  Extremely hot or cold fluids.  Drinks with caffeine.  Fatty, greasy foods.  Alcohol.  Tobacco.  Overeating.  Gelatin desserts.  Wash your hands well to avoid spreading bacteria and viruses.  Only take over-the-counter or prescription medicines for pain, discomfort, or fever as directed by your caregiver.  Ask your caregiver if you should continue all prescribed and over-the-counter medicines.  Keep all follow-up appointments with your caregiver. SEEK MEDICAL CARE IF:  You have abdominal pain and it increases or stays in one area (localizes).  You have a rash, stiff neck, or severe headache.  You are irritable, sleepy, or difficult to awaken.  You are weak, dizzy, or extremely thirsty. SEEK IMMEDIATE MEDICAL CARE IF:   You are unable to keep fluids down or you get worse despite treatment.  You have frequent episodes of vomiting or diarrhea.  You have blood or green matter (bile) in your vomit.  You have blood in your stool or your stool looks black and tarry.  You have not urinated in 6 to 8 hours, or you have only urinated a small amount of very dark urine.  You have a fever.  You faint. MAKE SURE YOU:   Understand these instructions.  Will watch your condition.  Will get help right away if you are not doing well or get worse. Document Released: 07/01/2005 Document Revised: 09/23/2011 Document Reviewed: 02/18/2011 ExitCare Patient Information 2015 [employer]. This information is not intended to replace advice given to you by your health care provider. Make sure you discuss any questions you have with your health care   provider.  

## 2014-03-29 NOTE — Progress Notes (Signed)
Gregory Farrell has completed 4 fractions to his left lung.  He denies pain.  He reports an occasional cough.  He denies an increase in shortness of breath.  He is wheezing.  He reports feeling sick on Sunday after chemotherapy this week.  He has lost 4 lbs since 03/22/14.  Orthostatic vitals done: BP sitting 115/60, hr 96, bp standing 73/47, hr 141.  He is taking amlodipine and lisinopril.  He reports feeling dizzy when standing.  He also reports feeling weak.  He is having chemotherapy again on Thursday.  He was given the Radiation Therapy and You book and discussed potential side effects/management of fatigue, skin changes and throat changes.  He was given radiaplex gel and was instructed to apply it to his left chest/back twice a day after treatment and at bedtime.

## 2014-03-29 NOTE — Progress Notes (Signed)
  Radiation Oncology         ([phone]) [phone] ________________________________  Name: Gregory Farrell MRN: [id number]  Date: 03/29/2014  DOB: 1933-06-30  Weekly Radiation Therapy Management  Lung cancer, hilus   Primary site: Lung   Staging method: AJCC 7th Edition   Clinical: Stage IIIA (T3, N2, M0)    Summary: Stage IIIA (T3, N2, M0)   Current Dose: 7.2 Gy     Planned Dose:  63 Gy  Narrative . . . . . . . . The patient presents for routine under treatment assessment.                                   The patient had a lot of problems with nausea over the weekend secondary to his chemotherapy. Patient admits to taking in very little by mouth. He does complain of dizziness with standing.  He is starting to feel a little better and was able to eat small amount earlier today.  The patient was re simulated last week and is tolerating the set up much better with his arm down by his right side                                  Set-up films were reviewed.                                 The chart was checked. Physical Findings. . .  height is 5\' 6"  (1.676 m) and weight is 187 lb 9.6 oz (85.095 kg). His oral temperature is 97.7 F (36.5 C). His blood pressure is 73/47 and his pulse is 141. His respiration is 16 and oxygen saturation is 95%. . The lungs are clear. The heart has a regular regular rhythm when sitting but increases to 141 with standing.  The patient has orthostatic blood pressure changes.  The oral cavity is somewhat dry without secondary infection Impression . . . . . . . The patient is tolerating radiation. Plan . . . . . . . . . . . . Continue treatment as planned.  The patient will be set up for IV fluids later today in light of his dehydration.  ________________________________   Blair Promise, PhD, MD

## 2014-03-30 ENCOUNTER — Other Ambulatory Visit: Payer: Self-pay | Admitting: Hematology & Oncology

## 2014-03-30 ENCOUNTER — Ambulatory Visit (HOSPITAL_COMMUNITY)
Admission: RE | Admit: 2014-03-30 | Discharge: 2014-03-30 | Disposition: A | Discharge status: Home / Self Care | Payer: Medicare HMO | Source: Ambulatory Visit | Attending: Hematology & Oncology | Admitting: Hematology & Oncology

## 2014-03-30 ENCOUNTER — Encounter (HOSPITAL_COMMUNITY): Payer: Self-pay

## 2014-03-30 ENCOUNTER — Ambulatory Visit
Admission: RE | Admit: 2014-03-30 | Discharge: 2014-03-30 | Disposition: A | Discharge status: Home / Self Care | Payer: Medicare HMO | Source: Ambulatory Visit | Attending: Radiation Oncology | Admitting: Radiation Oncology

## 2014-03-30 ENCOUNTER — Ambulatory Visit: Payer: Medicare HMO

## 2014-03-30 DIAGNOSIS — Z7982 Long term (current) use of aspirin: Secondary | ICD-10-CM | POA: Insufficient documentation

## 2014-03-30 DIAGNOSIS — C3400 Malignant neoplasm of unspecified main bronchus: Secondary | ICD-10-CM | POA: Diagnosis not present

## 2014-03-30 DIAGNOSIS — E785 Hyperlipidemia, unspecified: Secondary | ICD-10-CM | POA: Diagnosis not present

## 2014-03-30 DIAGNOSIS — E1142 Type 2 diabetes mellitus with diabetic polyneuropathy: Secondary | ICD-10-CM | POA: Diagnosis not present

## 2014-03-30 DIAGNOSIS — T17908S Unspecified foreign body in respiratory tract, part unspecified causing other injury, sequela: Principal | ICD-10-CM

## 2014-03-30 DIAGNOSIS — J189 Pneumonia, unspecified organism: Secondary | ICD-10-CM

## 2014-03-30 DIAGNOSIS — Z794 Long term (current) use of insulin: Secondary | ICD-10-CM | POA: Insufficient documentation

## 2014-03-30 DIAGNOSIS — Z79899 Other long term (current) drug therapy: Secondary | ICD-10-CM | POA: Insufficient documentation

## 2014-03-30 DIAGNOSIS — J449 Chronic obstructive pulmonary disease, unspecified: Secondary | ICD-10-CM | POA: Insufficient documentation

## 2014-03-30 DIAGNOSIS — E1149 Type 2 diabetes mellitus with other diabetic neurological complication: Secondary | ICD-10-CM | POA: Insufficient documentation

## 2014-03-30 DIAGNOSIS — C3402 Malignant neoplasm of left main bronchus: Secondary | ICD-10-CM

## 2014-03-30 DIAGNOSIS — Z510 Encounter for antineoplastic radiation therapy: Secondary | ICD-10-CM | POA: Diagnosis not present

## 2014-03-30 DIAGNOSIS — I10 Essential (primary) hypertension: Secondary | ICD-10-CM | POA: Insufficient documentation

## 2014-03-30 DIAGNOSIS — Z87891 Personal history of nicotine dependence: Secondary | ICD-10-CM | POA: Insufficient documentation

## 2014-03-30 DIAGNOSIS — I2510 Atherosclerotic heart disease of native coronary artery without angina pectoris: Secondary | ICD-10-CM | POA: Insufficient documentation

## 2014-03-30 DIAGNOSIS — Z452 Encounter for adjustment and management of vascular access device: Secondary | ICD-10-CM | POA: Insufficient documentation

## 2014-03-30 DIAGNOSIS — Z6830 Body mass index (BMI) 30.0-30.9, adult: Secondary | ICD-10-CM | POA: Diagnosis not present

## 2014-03-30 DIAGNOSIS — K219 Gastro-esophageal reflux disease without esophagitis: Secondary | ICD-10-CM | POA: Diagnosis not present

## 2014-03-30 DIAGNOSIS — J4489 Other specified chronic obstructive pulmonary disease: Secondary | ICD-10-CM | POA: Insufficient documentation

## 2014-03-30 LAB — BASIC METABOLIC PANEL
Anion gap: 15 (ref 5–15)
BUN: 39 mg/dL — AB (ref 6–23)
CALCIUM: 9.4 mg/dL (ref 8.4–10.5)
CHLORIDE: 100 meq/L (ref 96–112)
CO2: 20 mEq/L (ref 19–32)
CREATININE: 1.93 mg/dL — AB (ref 0.50–1.35)
GFR calc non Af Amer: 31 mL/min — ABNORMAL LOW (ref >90)
GFR, EST AFRICAN AMERICAN: 36 mL/min — AB (ref >90)
Glucose, Bld: 95 mg/dL (ref 70–99)
Potassium: 4.4 mEq/L (ref 3.7–5.3)
Sodium: 135 mEq/L — ABNORMAL LOW (ref 137–147)

## 2014-03-30 LAB — CBC WITH DIFFERENTIAL/PLATELET
BASOS ABS: 0 10*3/uL (ref 0.0–0.1)
BASOS PCT: 0 % (ref 0–1)
Eosinophils Absolute: 0.3 10*3/uL (ref 0.0–0.7)
Eosinophils Relative: 4 % (ref 0–5)
HCT: 40 % (ref 39.0–52.0)
Hemoglobin: 13 g/dL (ref 13.0–17.0)
Lymphocytes Relative: 10 % — ABNORMAL LOW (ref 12–46)
Lymphs Abs: 0.8 10*3/uL (ref 0.7–4.0)
MCH: 29.4 pg (ref 26.0–34.0)
MCHC: 32.5 g/dL (ref 30.0–36.0)
MCV: 90.5 fL (ref 78.0–100.0)
MONO ABS: 0.3 10*3/uL (ref 0.1–1.0)
MONOS PCT: 4 % (ref 3–12)
NEUTROS PCT: 82 % — AB (ref 43–77)
Neutro Abs: 6.8 10*3/uL (ref 1.7–7.7)
Platelets: 263 10*3/uL (ref 150–400)
RBC: 4.42 MIL/uL (ref 4.22–5.81)
RDW: 14.2 % (ref 11.5–15.5)
WBC: 8.3 10*3/uL (ref 4.0–10.5)

## 2014-03-30 LAB — GLUCOSE, CAPILLARY: Glucose-Capillary: 97 mg/dL (ref 70–99)

## 2014-03-30 LAB — PROTIME-INR
INR: 1.01 (ref 0.00–1.49)
Prothrombin Time: 13.3 seconds (ref 11.6–15.2)

## 2014-03-30 LAB — APTT: APTT: 28 s (ref 24–37)

## 2014-03-30 MED ORDER — SODIUM CHLORIDE 0.9 % IV SOLN
INTRAVENOUS | Status: DC
  Administered 2014-03-30: 13:00:00 via INTRAVENOUS

## 2014-03-30 MED ORDER — HEPARIN SOD (PORK) LOCK FLUSH 100 UNIT/ML IV SOLN
500.0000 [IU] | Freq: Once | INTRAVENOUS | Status: AC
  Administered 2014-03-30: 500 [IU] via INTRAVENOUS

## 2014-03-30 MED ORDER — MIDAZOLAM HCL 2 MG/2ML IJ SOLN
INTRAMUSCULAR | Status: AC
  Filled 2014-03-30: qty 2

## 2014-03-30 MED ORDER — FENTANYL CITRATE 0.05 MG/ML IJ SOLN
INTRAMUSCULAR | Status: AC
  Filled 2014-03-30: qty 2

## 2014-03-30 MED ORDER — FENTANYL CITRATE 0.05 MG/ML IJ SOLN
INTRAMUSCULAR | Status: AC | PRN
  Administered 2014-03-30: 100 ug via INTRAVENOUS

## 2014-03-30 MED ORDER — CEFAZOLIN SODIUM-DEXTROSE 2-3 GM-% IV SOLR
INTRAVENOUS | Status: AC
  Filled 2014-03-30: qty 50

## 2014-03-30 MED ORDER — MIDAZOLAM HCL 2 MG/2ML IJ SOLN
INTRAMUSCULAR | Status: AC | PRN
  Administered 2014-03-30: 2 mg via INTRAVENOUS

## 2014-03-30 MED ORDER — CEFAZOLIN SODIUM-DEXTROSE 2-3 GM-% IV SOLR
2.0000 g | INTRAVENOUS | Status: AC
  Administered 2014-03-30: 2 g via INTRAVENOUS

## 2014-03-30 MED ORDER — HEPARIN SOD (PORK) LOCK FLUSH 100 UNIT/ML IV SOLN
INTRAVENOUS | Status: DC
Start: 2014-03-30 — End: 2014-03-31
  Filled 2014-03-30: qty 5

## 2014-03-30 MED ORDER — LIDOCAINE HCL 1 % IJ SOLN
INTRAMUSCULAR | Status: AC
  Filled 2014-03-30: qty 20

## 2014-03-30 MED ORDER — MIDAZOLAM HCL 2 MG/2ML IJ SOLN
INTRAMUSCULAR | Status: DC
Start: 2014-03-30 — End: 2014-03-31
  Filled 2014-03-30: qty 2

## 2014-03-30 NOTE — Discharge Instructions (Signed)
Leave dressing on for 24 hours.  You may shower after 24 hours.  Please remove the dressing before you shower.    Implanted [facility] Guide An implanted port is a type of central line that is placed under the skin. Central lines are used to provide IV access when treatment or nutrition needs to be given through a person's veins. Implanted ports are used for long-term IV access. An implanted port may be placed because:   You need IV medicine that would be irritating to the small veins in your hands or arms.   You need long-term IV medicines, such as antibiotics.   You need IV nutrition for a long period.   You need frequent blood draws for lab tests.   You need dialysis.  Implanted ports are usually placed in the chest area, but they can also be placed in the upper arm, the abdomen, or the leg. An implanted port has two main parts:   Reservoir. The reservoir is round and will appear as a small, raised area under your skin. The reservoir is the part where a needle is inserted to give medicines or draw blood.   Catheter. The catheter is a thin, flexible tube that extends from the reservoir. The catheter is placed into a large vein. Medicine that is inserted into the reservoir goes into the catheter and then into the vein.  HOW WILL I CARE FOR MY INCISION SITE? Do not get the incision site wet. Bathe or shower as directed by your health care provider.  HOW IS MY PORT ACCESSED? Special steps must be taken to access the port:   Before the port is accessed, a numbing cream can be placed on the skin. This helps numb the skin over the port site.   Your health care provider uses a sterile technique to access the port.  Your health care provider must put on a mask and sterile gloves.  The skin over your port is cleaned carefully with an antiseptic and allowed to dry.  The port is gently pinched between sterile gloves, and a needle is inserted into the port.  Only "non-coring" port  needles should be used to access the port. Once the port is accessed, a blood return should be checked. This helps ensure that the port is in the vein and is not clogged.   If your port needs to remain accessed for a constant infusion, a clear (transparent) bandage will be placed over the needle site. The bandage and needle will need to be changed every week, or as directed by your health care provider.   Keep the bandage covering the needle clean and dry. Do not get it wet. Follow your health care provider's instructions on how to take a shower or bath while the port is accessed.   If your port does not need to stay accessed, no bandage is needed over the port.  WHAT IS FLUSHING? Flushing helps keep the port from getting clogged. Follow your health care provider's instructions on how and when to flush the port. Ports are usually flushed with saline solution or a medicine called heparin. The need for flushing will depend on how the port is used.   If the port is used for intermittent medicines or blood draws, the port will need to be flushed:   After medicines have been given.   After blood has been drawn.   As part of routine maintenance.   If a constant infusion is running, the port may not  need to be flushed.  HOW LONG WILL MY PORT STAY IMPLANTED? The port can stay in for as long as your health care provider thinks it is needed. When it is time for the port to come out, surgery will be done to remove it. The procedure is similar to the one performed when the port was put in.  WHEN SHOULD I SEEK IMMEDIATE MEDICAL CARE? When you have an implanted port, you should seek immediate medical care if:   You notice a bad smell coming from the incision site.   You have swelling, redness, or drainage at the incision site.   You have more swelling or pain at the port site or the surrounding area.   You have a fever that is not controlled with medicine. Document Released: 07/01/2005  Document Revised: 04/21/2013 Document Reviewed: 03/08/2013 [facility] Patient Information 2015 [place], [place]. This information is not intended to replace advice given to you by your health care provider. Make sure you discuss any questions you have with your health care provider.  Conscious Sedation, Adult, Care After Refer to this sheet in the next few weeks. These instructions provide you with information on caring for yourself after your procedure. Your health care provider may also give you more specific instructions. Your treatment has been planned according to current medical practices, but problems sometimes occur. Call your health care provider if you have any problems or questions after your procedure. WHAT TO EXPECT AFTER THE PROCEDURE  After your procedure:  You may feel sleepy, clumsy, and have poor balance for several hours.  Vomiting may occur if you eat too soon after the procedure. HOME CARE INSTRUCTIONS  Do not participate in any activities where you could become injured for at least 24 hours. Do not:  Drive.  Swim.  Ride a bicycle.  Operate heavy machinery.  Cook.  Use power tools.  Climb ladders.  Work from a high place.  Do not make important decisions or sign legal documents until you are improved.  If you vomit, drink water, juice, or soup when you can drink without vomiting. Make sure you have little or no nausea before eating solid foods.  Only take over-the-counter or prescription medicines for pain, discomfort, or fever as directed by your health care provider.  Make sure you and your family fully understand everything about the medicines given to you, including what side effects may occur.  You should not drink alcohol, take sleeping pills, or take medicines that cause drowsiness for at least 24 hours.  If you smoke, do not smoke without supervision.  If you are feeling better, you may resume normal activities 24 hours after you were  sedated.  Keep all appointments with your health care provider. SEEK MEDICAL CARE IF:  Your skin is pale or bluish in color.  You continue to feel nauseous or vomit.  Your pain is getting worse and is not helped by medicine.  You have bleeding or swelling.  You are still sleepy or feeling clumsy after 24 hours. SEEK IMMEDIATE MEDICAL CARE IF:  You develop a rash.  You have difficulty breathing.  You develop any type of allergic problem.  You have a fever. MAKE SURE YOU:  Understand these instructions.  Will watch your condition.  Will get help right away if you are not doing well or get worse. Document Released: 04/21/2013 Document Reviewed: 04/21/2013 [facility] Patient Information 2015 [place], [place]. This information is not intended to replace advice given to you by your health care  provider. Make sure you discuss any questions you have with your health care provider. Implanted Port Insertion, Care After Refer to this sheet in the next few weeks. These instructions provide you with information on caring for yourself after your procedure. Your health care provider may also give you more specific instructions. Your treatment has been planned according to current medical practices, but problems sometimes occur. Call your health care provider if you have any problems or questions after your procedure. WHAT TO EXPECT AFTER THE PROCEDURE After your procedure, it is typical to have the following:   Discomfort at the port insertion site. Ice packs to the area will help.  Bruising on the skin over the port. This will subside in 3-4 days. HOME CARE INSTRUCTIONS  After your port is placed, you will get a manufacturer's information card. The card has information about your port. Keep this card with you at all times.   Know what kind of port you have. There are many types of ports available.   Wear a medical alert bracelet in case of an emergency. This can help alert health care  workers that you have a port.   The port can stay in for as long as your health care provider believes it is necessary.   A home health care nurse may give medicines and take care of the port.   You or a family member can get special training and directions for giving medicine and taking care of the port at home.  SEEK MEDICAL CARE IF:   Your port does not flush or you are unable to get a blood return.   You have a fever or chills. SEEK IMMEDIATE MEDICAL CARE IF:  You have new fluid or pus coming from your incision.   You notice a bad smell coming from your incision site.   You have swelling, pain, or more redness at the incision or port site.   You have chest pain or shortness of breath. Document Released: 04/21/2013 Document Revised: 07/06/2013 Document Reviewed: 04/21/2013 [facility] Patient Information 2015 [place], [place]. This information is not intended to replace advice given to you by your health care provider. Make sure you discuss any questions you have with your health care provider.

## 2014-03-30 NOTE — H&P (Signed)
Chief Complaint: "I am here for a port."  Referring Physician(s): Ennever,Peter R  History of Present Illness: Gregory Farrell is a 80 y.o. male with left lung cancer undergoing radiation and is scheduled to start chemotherapy 03/31/14. He is here today for a port a catheter placement with moderate sedation. He denies any chest pain, change in chronic shortness of breath or palpitations. He denies any active signs of bleeding or excessive bruising. He denies any recent fever or chills. The patient denies any history of sleep apnea or chronic oxygen use. He has previously tolerated sedation without complications during a heart catheterization.   Past Medical History  Diagnosis Date  . Hypertension   . COPD (chronic obstructive pulmonary disease)   . GERD (gastroesophageal reflux disease)   . Renal insufficiency   . Hyperlipidemia   . CAD (coronary artery disease)   . B12 deficiency   . Memory loss   . Vertigo   . Diabetes mellitus     type II, poly neuropathy  . Overweight 01/31/2013  . Lung cancer, hilus 03/22/2014    Past Surgical History  Procedure Laterality Date  . Coronary angioplasty with stent placement    . Inguinal hernia repair  2010  . Video bronchoscopy Bilateral 03/07/2014    Procedure: VIDEO BRONCHOSCOPY WITHOUT FLUORO;  Surgeon: Kathee Delton, MD;  Location: [facility];  Service: Cardiopulmonary;  Laterality: Bilateral;    Allergies: Exenatide  Medications: Prior to Admission medications   Medication Sig Start Date End Date Taking? Authorizing Provider  albuterol (PROVENTIL HFA;VENTOLIN HFA) 108 (90 BASE) MCG/ACT inhaler Inhale 2 puffs into the lungs every 6 (six) hours as needed for wheezing or shortness of breath.    Historical Provider, MD  amLODipine (NORVASC) 10 MG tablet Take 5 mg by mouth daily.    Historical Provider, MD  aspirin 81 MG tablet Take 81 mg by mouth daily.      Historical Provider, MD  atorvastatin (LIPITOR) 80 MG tablet Take 80 mg by  mouth daily.    Historical Provider, MD  budesonide-formoterol (SYMBICORT) 160-4.5 MCG/ACT inhaler Inhale 2 puffs into the lungs 2 (two) times daily.    Historical Provider, MD  cefdinir (OMNICEF) 300 MG capsule Take 1 capsule (300 mg total) by mouth 2 (two) times daily. 03/22/14   Volanda Napoleon, MD  citalopram (CELEXA) 20 MG tablet Take 40 mg by mouth daily.    Historical Provider, MD  Insulin Detemir (LEVEMIR FLEXTOUCH) 100 UNIT/ML Pen Inject 60 Units into the skin every morning. May increase by 2 units every 3 days as directed 02/15/14   Mosie Lukes, MD  lisinopril (PRINIVIL,ZESTRIL) 5 MG tablet Take 0.5 tablets (2.5 mg total) by mouth daily. 02/15/14   Mosie Lukes, MD  LORazepam (ATIVAN) 0.5 MG tablet One tablet every 6 hours as needed for nausea and vomiting. 03/24/14   Volanda Napoleon, MD  mometasone-formoterol (DULERA) 200-5 MCG/ACT AERO Inhale 2 puffs into the lungs 2 (two) times daily. 02/28/14   Elsie Stain, MD  ondansetron (ZOFRAN) 8 MG tablet Take one by mouth every 12 hours. Start day after chemo for 3 days then as needed for nausea and vomiting 03/24/14   Volanda Napoleon, MD  oxyCODONE-acetaminophen (PERCOCET/ROXICET) 5-325 MG per tablet Take 1 tablet by mouth every 4 (four) hours as needed for severe pain. 03/17/14   Blair Promise, MD  pantoprazole (PROTONIX) 40 MG tablet TAKE ONE TABLET BY MOUTH ONE TIME DAILY  03/17/14  Mosie Lukes, MD  prochlorperazine (COMPAZINE) 10 MG tablet Take 1 tablet (10 mg total) by mouth every 6 (six) hours as needed for nausea or vomiting. 03/24/14   Volanda Napoleon, MD  Wound Cleansers (RADIAPLEX EX) Apply topically.    Historical Provider, MD    Family History  Problem Relation Age of Onset  . Coronary artery disease Mother   . Breast cancer Mother   . Diabetes Mother   . Leukemia Brother     History   Social History  . Marital Status: Married    Spouse Name: N/A    Number of Children: 1  . Years of Education: N/A   Occupational  History  . Retired     [employer]   Social History Main Topics  . Smoking status: Former Smoker -- 1.00 packs/day for 20 years    Types: Cigarettes    Start date: 09/09/1982    Quit date: 07/15/2002  . Smokeless tobacco: Never Used     Comment: quit smoking 11 years ago  . Alcohol Use: No  . Drug Use: None  . Sexual Activity: None   Other Topics Concern  . None   Social History Narrative  . None   Review of Systems: A 12 point ROS discussed and pertinent positives are indicated in the HPI above.  All other systems are negative.  Review of Systems  Constitutional: Negative for fever and chills.  Cardiovascular: Negative for chest pain.  Gastrointestinal: Negative for blood in stool.  Genitourinary: Negative for hematuria.   Vital Signs: BP 102/46  Pulse 71  Temp(Src) 97.3 F (36.3 C)  Resp 16  Ht 5\' 6"  (1.676 m)  Wt 187 lb (84.823 kg)  BMI 30.20 kg/m2  SpO2 96%  Physical Exam  Constitutional: He is oriented to person, place, and time. No distress.  HENT:  Head: Normocephalic and atraumatic.  Neck: No tracheal deviation present.  Cardiovascular: Normal rate and regular rhythm.  Exam reveals no gallop and no friction rub.   No murmur heard. Pulmonary/Chest: Effort normal. He has wheezes.  Abdominal: Soft. Bowel sounds are normal. He exhibits no distension. There is no tenderness.  Neurological: He is alert and oriented to person, place, and time.  Skin: Skin is warm and dry. He is not diaphoretic.  Psychiatric: He has a normal mood and affect. His behavior is normal. Thought content normal.   Imaging: Ct Head W Wo Contrast  03/17/2014   CLINICAL DATA:  79 year old male with recent diagnosis of lung cancer. Staging. Subsequent encounter.  EXAM: CT HEAD WITHOUT AND WITH CONTRAST  TECHNIQUE: Contiguous axial images were obtained from the base of the skull through the vertex without and with intravenous contrast  CONTRAST:  164mL OMNIPAQUE IOHEXOL 300 MG/ML  SOLN   COMPARISON:  PET-CT 03/08/2014.  Noncontrast brain MRI 08/03/2007.  FINDINGS: Right side EAC hearing aid. Visualized paranasal sinuses and mastoids are clear. No acute or suspicious osseous lesion identified. Calcified atherosclerosis at the skull base. Postoperative changes to the globes. Visualized scalp soft tissues are within normal limits.  Cerebral volume is within normal limits for age. No midline shift, ventriculomegaly, mass effect, evidence of mass lesion, intracranial hemorrhage or evidence of cortically based acute infarction. Gray-white matter differentiation is within normal limits throughout the brain. No abnormal enhancement identified. Major intracranial vascular structures are enhancing.  IMPRESSION: Negative for age.  No acute or metastatic intracranial abnormality.   Electronically Signed   By: Lars Pinks M.D.   On:  03/17/2014 12:47   Mr Abdomen W Wo Contrast  03/13/2014   CLINICAL DATA:  Recently diagnosed lung cancer with left lower lobe collapse and mild hypermetabolism within a left adrenal nodule on PET-CT.  EXAM: MRI ABDOMEN WITHOUT AND WITH CONTRAST  TECHNIQUE: Multiplanar multisequence MR imaging of the abdomen was performed both before and after the administration of intravenous contrast.  CONTRAST:  58mL MULTIHANCE GADOBENATE DIMEGLUMINE 529 MG/ML IV SOLN  COMPARISON:  Chest CT 02/23/2014 and 10/21/2012.  PET-CT 03/08/2014.  FINDINGS: Again demonstrated is a 2.4 x 1.9 cm left adrenal nodule. This nodule demonstrates loss of signal on gradient echo opposed phase images consistent with an adenoma. This nodule is unchanged in size from the 10/21/2012 chest CT. It demonstrates nonspecific T2 signal and enhancement.  There is no right adrenal nodule.  The liver, spleen, pancreas, gallbladder and biliary system appear unremarkable. There are multiple renal cysts bilaterally. Both kidneys demonstrate mild cortical thinning. There is no abdominal adenopathy or ascites. Mild degenerative  changes are present throughout the lumbar spine.  Left lower lobe lung collapse again noted.  IMPRESSION: 1. The left adrenal nodule has signal characteristics consistent with an adenoma. The density on CT and stability from prior CT performed 16 months ago are also consistent with an adenoma. A collision lesion accounting for the mildly increased metabolic activity on PET-CT cannot be completely excluded but is doubtful. 2. No evidence of metastatic disease within the upper abdomen. 3. Multiple renal cysts bilaterally. 4. Persistent left lower lobe pulmonary collapse.   Electronically Signed   By: Camie Patience M.D.   On: 03/13/2014 15:02   Nm Pet Image Initial (pi) Skull Base To Thigh  03/08/2014   CLINICAL DATA:  Initial treatment strategy for lung carcinoma. Left hilar mass.  EXAM: NUCLEAR MEDICINE PET SKULL BASE TO THIGH  TECHNIQUE: 5.8 mCi F-18 FDG was injected intravenously. Full-ring PET imaging was performed from the skull base to thigh after the radiotracer. CT data was obtained and used for attenuation correction and anatomic localization.  FASTING BLOOD GLUCOSE:  Value: 195 mg/dl  COMPARISON:  CT 02/23/2014, for the L 14  FINDINGS: NECK  No hypermetabolic lymph nodes in the neck.  CHEST  There is a hypermetabolic left infrahilar mass with intense metabolic activity (SUV max 17.2). This hilar mass obstructs the lower lobe bronchus with collapse of left lower lobe. Within the collapsed left lower lobe there is a focus of hypermetabolic activity which is intense (SUV max equal 14). No corresponding lesion on the CT exam. There is a hypermetabolic lymph node along the left mainstem bronchus measuring 18 mm (image 76). There are no hypermetabolic contralateral lymph nodes. No hypermetabolic supraclavicular lymph nodes.  Review of the lung parenchyma demonstrates a small mid nodule in the left upper lobe (image 70, series 4 which is seen on comparison CT from 2014.  ABDOMEN/PELVIS  There is enlargement of  the left adrenal gland to 29 x 23 mm (image 115 series 4). The lesion measures just above the cut off criteria for a benign adenoma but is relatively low-density weight Hounsfield units equals to simple fluid attenuation. There is a focus of hypermetabolic activity along the medial border of the large left adrenal gland with moderate metabolic activity (SUV max 4.4). This activity is similar to liver activity.  No abnormal focal metabolic activity within the liver. No hypermetabolic abdominal pelvic lymph nodes.  Is low attenuation cyst of the left kidney. Metabolic activity associated with the proximal left hip is  felt to degenerative in nature.  SKELETON  No focal hypermetabolic activity to suggest skeletal metastasis.  IMPRESSION: 1. Hypermetabolic left infrahilar mass obstructs the left lower lobe bronchus consistent with bronchogenic carcinoma. 2. Hypermetabolic focus within the atelectatic left lower lobe. This could represent metabolic activity associated atelectasis but cannot exclude lesion within the lung itself. 3. Hypermetabolic left lower peribronchial lymph node. 4. No contralateral hypermetabolic nodes or supraclavicular nodes. 5. Mild hypermetabolic activity associated with enlarged left adrenal gland. Burtis Junes this to represent a benign adenoma cannot be characterized as such on this exam. Consider an MRI adrenal protocol for confirmation. 6. Stable small left upper lobe pulmonary nodule.   Electronically Signed   By: Suzy Bouchard M.D.   On: 03/08/2014 12:25    Labs: Lab Results  Component Value Date   WBC 8.3 03/30/2014   HCT 40.0 03/30/2014   MCV 90.5 03/30/2014   PLT 263 03/30/2014   NA 134* 03/29/2014   K 4.8 03/29/2014   CL 102 03/22/2014   CO2 19* 03/29/2014   GLUCOSE 177* 03/29/2014   BUN 36.3* 03/29/2014   CREATININE 2.0* 03/29/2014   CALCIUM 9.4 03/29/2014   PROT 6.9 03/22/2014   ALBUMIN 3.4* 03/22/2014   AST 17 03/22/2014   ALT 12 03/22/2014   ALKPHOS 105 03/22/2014   BILITOT 0.9 03/22/2014    GFRNONAA 40 07/11/2008   GFRAA 48 07/11/2008   INR 0.97 03/01/2014    Assessment and Plan: Left lung cancer undergoing radiation and will start chemotherapy 03/31/14 Scheduled today for image guided port a catheter placement with moderate sedation Patient has been NPO, no blood thinners, afebrile, labs reviewed.  Risks and Benefits discussed with the patient. All of the patient's questions were answered, patient is agreeable to proceed. Consent signed and in chart.   Tsosie Billing PA-C Interventional Radiology  03/30/14  1:37 PM

## 2014-03-30 NOTE — Procedures (Signed)
R IJ Port cathter placement with US and fluoroscopy No complication No blood loss. See complete dictation in [facility] PACS.  

## 2014-03-31 ENCOUNTER — Ambulatory Visit (HOSPITAL_BASED_OUTPATIENT_CLINIC_OR_DEPARTMENT_OTHER): Payer: Medicare HMO

## 2014-03-31 ENCOUNTER — Ambulatory Visit
Admission: RE | Admit: 2014-03-31 | Discharge: 2014-03-31 | Disposition: A | Discharge status: Home / Self Care | Payer: Medicare HMO | Source: Ambulatory Visit | Attending: Radiation Oncology | Admitting: Radiation Oncology

## 2014-03-31 ENCOUNTER — Ambulatory Visit: Payer: Medicare HMO

## 2014-03-31 ENCOUNTER — Ambulatory Visit (HOSPITAL_BASED_OUTPATIENT_CLINIC_OR_DEPARTMENT_OTHER): Payer: Medicare HMO | Admitting: Family

## 2014-03-31 ENCOUNTER — Telehealth: Payer: Self-pay | Admitting: Hematology & Oncology

## 2014-03-31 ENCOUNTER — Other Ambulatory Visit: Payer: Self-pay | Admitting: Family Medicine

## 2014-03-31 ENCOUNTER — Other Ambulatory Visit (HOSPITAL_BASED_OUTPATIENT_CLINIC_OR_DEPARTMENT_OTHER): Payer: Medicare HMO | Admitting: Lab

## 2014-03-31 ENCOUNTER — Other Ambulatory Visit: Payer: Self-pay | Admitting: Family

## 2014-03-31 VITALS — BP 103/60 | HR 69 | Temp 97.4°F | Resp 26 | Ht 68.0 in | Wt 191.0 lb

## 2014-03-31 DIAGNOSIS — Z5111 Encounter for antineoplastic chemotherapy: Secondary | ICD-10-CM

## 2014-03-31 DIAGNOSIS — C3400 Malignant neoplasm of unspecified main bronchus: Secondary | ICD-10-CM

## 2014-03-31 DIAGNOSIS — J189 Pneumonia, unspecified organism: Secondary | ICD-10-CM

## 2014-03-31 DIAGNOSIS — T17908S Unspecified foreign body in respiratory tract, part unspecified causing other injury, sequela: Principal | ICD-10-CM

## 2014-03-31 DIAGNOSIS — Z510 Encounter for antineoplastic radiation therapy: Secondary | ICD-10-CM | POA: Diagnosis not present

## 2014-03-31 DIAGNOSIS — C3402 Malignant neoplasm of left main bronchus: Secondary | ICD-10-CM

## 2014-03-31 DIAGNOSIS — D509 Iron deficiency anemia, unspecified: Secondary | ICD-10-CM | POA: Insufficient documentation

## 2014-03-31 DIAGNOSIS — R7989 Other specified abnormal findings of blood chemistry: Secondary | ICD-10-CM | POA: Insufficient documentation

## 2014-03-31 DIAGNOSIS — R918 Other nonspecific abnormal finding of lung field: Secondary | ICD-10-CM

## 2014-03-31 LAB — CMP (CANCER CENTER ONLY)
ALT: 15 U/L (ref 10–47)
AST: 23 U/L (ref 11–38)
Albumin: 2.7 g/dL — ABNORMAL LOW (ref 3.3–5.5)
Alkaline Phosphatase: 84 U/L (ref 26–84)
BILIRUBIN TOTAL: 0.6 mg/dL (ref 0.20–1.60)
BUN, Bld: 32 mg/dL — ABNORMAL HIGH (ref 7–22)
CO2: 21 meq/L (ref 18–33)
CREATININE: 2 mg/dL — AB (ref 0.6–1.2)
Calcium: 8.6 mg/dL (ref 8.0–10.3)
Chloride: 99 mEq/L (ref 98–108)
GLUCOSE: 113 mg/dL (ref 73–118)
Potassium: 4.7 mEq/L (ref 3.3–4.7)
Sodium: 137 mEq/L (ref 128–145)
Total Protein: 6.8 g/dL (ref 6.4–8.1)

## 2014-03-31 LAB — CBC WITH DIFFERENTIAL (CANCER CENTER ONLY)
BASO#: 0 10*3/uL (ref 0.0–0.2)
BASO%: 0.3 % (ref 0.0–2.0)
EOS ABS: 0.3 10*3/uL (ref 0.0–0.5)
EOS%: 3.9 % (ref 0.0–7.0)
HCT: 38.3 % — ABNORMAL LOW (ref 38.7–49.9)
HGB: 12.5 g/dL — ABNORMAL LOW (ref 13.0–17.1)
LYMPH#: 0.6 10*3/uL — ABNORMAL LOW (ref 0.9–3.3)
LYMPH%: 8.1 % — AB (ref 14.0–48.0)
MCH: 29.9 pg (ref 28.0–33.4)
MCHC: 32.6 g/dL (ref 32.0–35.9)
MCV: 92 fL (ref 82–98)
MONO#: 0.4 10*3/uL (ref 0.1–0.9)
MONO%: 5.3 % (ref 0.0–13.0)
NEUT#: 6.2 10*3/uL (ref 1.5–6.5)
NEUT%: 82.4 % — ABNORMAL HIGH (ref 40.0–80.0)
Platelets: 243 10*3/uL (ref 145–400)
RBC: 4.18 10*6/uL — AB (ref 4.20–5.70)
RDW: 14.2 % (ref 11.1–15.7)
WBC: 7.5 10*3/uL (ref 4.0–10.0)

## 2014-03-31 LAB — IRON AND TIBC
%SAT: 15 % — AB (ref 20–55)
IRON: 28 ug/dL — AB (ref 42–165)
TIBC: 191 ug/dL — AB (ref 215–435)
UIBC: 163 ug/dL (ref 125–400)

## 2014-03-31 LAB — PREALBUMIN: PREALBUMIN: 13.2 mg/dL — AB (ref 17.0–34.0)

## 2014-03-31 LAB — TESTOSTERONE: Testosterone: 64 ng/dL — ABNORMAL LOW (ref 300–890)

## 2014-03-31 LAB — FERRITIN: Ferritin: 424 ng/mL — ABNORMAL HIGH (ref 22–322)

## 2014-03-31 MED ORDER — TESTOSTERONE CYPIONATE 200 MG/ML IM SOLN: 400.0000 mg | Freq: Once | INTRAMUSCULAR | Status: DC

## 2014-03-31 MED ORDER — PACLITAXEL CHEMO INJECTION 300 MG/50ML
90.0000 mg | Freq: Once | INTRAVENOUS | Status: AC
  Administered 2014-03-31: 90 mg via INTRAVENOUS
  Filled 2014-03-31: qty 15

## 2014-03-31 MED ORDER — FAMOTIDINE IN NACL 20-0.9 MG/50ML-% IV SOLN
INTRAVENOUS | Status: AC
  Filled 2014-03-31: qty 50

## 2014-03-31 MED ORDER — DEXAMETHASONE SODIUM PHOSPHATE 20 MG/5ML IJ SOLN
20.0000 mg | Freq: Once | INTRAMUSCULAR | Status: AC
  Administered 2014-03-31: 20 mg via INTRAVENOUS

## 2014-03-31 MED ORDER — FAMOTIDINE IN NACL 20-0.9 MG/50ML-% IV SOLN
20.0000 mg | Freq: Once | INTRAVENOUS | Status: AC
  Administered 2014-03-31: 20 mg via INTRAVENOUS

## 2014-03-31 MED ORDER — SODIUM CHLORIDE 0.9 % IV SOLN: 1020.0000 mg | Freq: Once | INTRAVENOUS | Status: DC

## 2014-03-31 MED ORDER — SODIUM CHLORIDE 0.9 % IJ SOLN
10.0000 mL | INTRAMUSCULAR | Status: DC | PRN
  Administered 2014-03-31: 10 mL
  Filled 2014-03-31: qty 10

## 2014-03-31 MED ORDER — SODIUM CHLORIDE 0.9 % IV SOLN
1000.0000 mL | Freq: Once | INTRAVENOUS | Status: AC
Start: 2014-03-31 — End: 2014-03-31
  Administered 2014-03-31: 1000 mL via INTRAVENOUS

## 2014-03-31 MED ORDER — DIPHENHYDRAMINE HCL 25 MG PO CAPS
25.0000 mg | ORAL_CAPSULE | Freq: Once | ORAL | Status: AC
  Administered 2014-03-31: 25 mg via ORAL

## 2014-03-31 MED ORDER — ONDANSETRON 16 MG/50ML IVPB (CHCC)
INTRAVENOUS | Status: AC
  Filled 2014-03-31: qty 16

## 2014-03-31 MED ORDER — HEPARIN SOD (PORK) LOCK FLUSH 100 UNIT/ML IV SOLN
500.0000 [IU] | Freq: Once | INTRAVENOUS | Status: AC | PRN
  Administered 2014-03-31: 500 [IU]
  Filled 2014-03-31: qty 5

## 2014-03-31 MED ORDER — ONDANSETRON 16 MG/50ML IVPB (CHCC)
16.0000 mg | Freq: Once | INTRAVENOUS | Status: AC
  Administered 2014-03-31: 16 mg via INTRAVENOUS

## 2014-03-31 MED ORDER — PACLITAXEL CHEMO INJECTION 300 MG/50ML: 45.0000 mg/m2 | Freq: Once | INTRAVENOUS | Status: DC

## 2014-03-31 MED ORDER — DIPHENHYDRAMINE HCL 25 MG PO CAPS
ORAL_CAPSULE | ORAL | Status: AC
  Filled 2014-03-31: qty 2

## 2014-03-31 MED ORDER — SODIUM CHLORIDE 0.9 % IV SOLN
123.4000 mg | Freq: Once | INTRAVENOUS | Status: AC
  Administered 2014-03-31: 120 mg via INTRAVENOUS
  Filled 2014-03-31: qty 12

## 2014-03-31 MED ORDER — DEXAMETHASONE SODIUM PHOSPHATE 20 MG/5ML IJ SOLN
INTRAMUSCULAR | Status: AC
  Filled 2014-03-31: qty 5

## 2014-03-31 NOTE — Patient Instructions (Signed)
Paclitaxel injection What is this medicine? PACLITAXEL (PAK li TAX el) is a chemotherapy drug. It targets fast dividing cells, like cancer cells, and causes these cells to die. This medicine is used to treat ovarian cancer, breast cancer, and other cancers. This medicine may be used for other purposes; ask your health care provider or pharmacist if you have questions. COMMON BRAND NAME(S): Onxol, Taxol What should I tell my health care provider before I take this medicine? They need to know if you have any of these conditions: -blood disorders -irregular heartbeat -infection (especially a virus infection such as chickenpox, cold sores, or herpes) -liver disease -previous or ongoing radiation therapy -an unusual or allergic reaction to paclitaxel, alcohol, polyoxyethylated castor oil, other chemotherapy agents, other medicines, foods, dyes, or preservatives -pregnant or trying to get pregnant -breast-feeding How should I use this medicine? This drug is given as an infusion into a vein. It is administered in a hospital or clinic by a specially trained health care professional. Talk to your pediatrician regarding the use of this medicine in children. Special care may be needed. Overdosage: If you think you have taken too much of this medicine contact a poison control center or emergency room at once. NOTE: This medicine is only for you. Do not share this medicine with others. What if I miss a dose? It is important not to miss your dose. Call your doctor or health care professional if you are unable to keep an appointment. What may interact with this medicine? Do not take this medicine with any of the following medications: -disulfiram -metronidazole This medicine may also interact with the following medications: -cyclosporine -diazepam -ketoconazole -medicines to increase blood counts like filgrastim, pegfilgrastim, sargramostim -other chemotherapy drugs like cisplatin, doxorubicin,  epirubicin, etoposide, teniposide, vincristine -quinidine -testosterone -vaccines -verapamil Talk to your doctor or health care professional before taking any of these medicines: -acetaminophen -aspirin -ibuprofen -ketoprofen -naproxen This list may not describe all possible interactions. Give your health care provider a list of all the medicines, herbs, non-prescription drugs, or dietary supplements you use. Also tell them if you smoke, drink alcohol, or use illegal drugs. Some items may interact with your medicine. What should I watch for while using this medicine? Your condition will be monitored carefully while you are receiving this medicine. You will need important blood work done while you are taking this medicine. This drug may make you feel generally unwell. This is not uncommon, as chemotherapy can affect healthy cells as well as cancer cells. Report any side effects. Continue your course of treatment even though you feel ill unless your doctor tells you to stop. In some cases, you may be given additional medicines to help with side effects. Follow all directions for their use. Call your doctor or health care professional for advice if you get a fever, chills or sore throat, or other symptoms of a cold or flu. Do not treat yourself. This drug decreases your body's ability to fight infections. Try to avoid being around people who are sick. This medicine may increase your risk to bruise or bleed. Call your doctor or health care professional if you notice any unusual bleeding. Be careful brushing and flossing your teeth or using a toothpick because you may get an infection or bleed more easily. If you have any dental work done, tell your dentist you are receiving this medicine. Avoid taking products that contain aspirin, acetaminophen, ibuprofen, naproxen, or ketoprofen unless instructed by your doctor. These medicines may hide a fever.   Do not become pregnant while taking this medicine.  Women should inform their doctor if they wish to become pregnant or think they might be pregnant. There is a potential for serious side effects to an unborn child. Talk to your health care professional or pharmacist for more information. Do not breast-feed an infant while taking this medicine. Men are advised not to father a child while receiving this medicine. What side effects may I notice from receiving this medicine? Side effects that you should report to your doctor or health care professional as soon as possible: -allergic reactions like skin rash, itching or hives, swelling of the face, lips, or tongue -low blood counts - This drug may decrease the number of white blood cells, red blood cells and platelets. You may be at increased risk for infections and bleeding. -signs of infection - fever or chills, cough, sore throat, pain or difficulty passing urine -signs of decreased platelets or bleeding - bruising, pinpoint red spots on the skin, black, tarry stools, nosebleeds -signs of decreased red blood cells - unusually weak or tired, fainting spells, lightheadedness -breathing problems -chest pain -high or low blood pressure -mouth sores -nausea and vomiting -pain, swelling, redness or irritation at the injection site -pain, tingling, numbness in the hands or feet -slow or irregular heartbeat -swelling of the ankle, feet, hands Side effects that usually do not require medical attention (report to your doctor or health care professional if they continue or are bothersome): -bone pain -complete hair loss including hair on your head, underarms, pubic hair, eyebrows, and eyelashes -changes in the color of fingernails -diarrhea -loosening of the fingernails -loss of appetite -muscle or joint pain -red flush to skin -sweating This list may not describe all possible side effects. Call your doctor for medical advice about side effects. You may report side effects to FDA at  1-800-FDA-1088. Where should I keep my medicine? This drug is given in a hospital or clinic and will not be stored at home. NOTE: This sheet is a summary. It may not cover all possible information. If you have questions about this medicine, talk to your doctor, pharmacist, or health care provider.  2015, Elsevier/Gold Standard. (2012-08-24 16:41:21)  Carboplatin injection What is this medicine? CARBOPLATIN (KAR boe pla tin) is a chemotherapy drug. It targets fast dividing cells, like cancer cells, and causes these cells to die. This medicine is used to treat ovarian cancer and many other cancers. This medicine may be used for other purposes; ask your health care provider or pharmacist if you have questions. COMMON BRAND NAME(S): Paraplatin What should I tell my health care provider before I take this medicine? They need to know if you have any of these conditions: -blood disorders -hearing problems -kidney disease -recent or ongoing radiation therapy -an unusual or allergic reaction to carboplatin, cisplatin, other chemotherapy, other medicines, foods, dyes, or preservatives -pregnant or trying to get pregnant -breast-feeding How should I use this medicine? This drug is usually given as an infusion into a vein. It is administered in a hospital or clinic by a specially trained health care professional. Talk to your pediatrician regarding the use of this medicine in children. Special care may be needed. Overdosage: If you think you have taken too much of this medicine contact a poison control center or emergency room at once. NOTE: This medicine is only for you. Do not share this medicine with others. What if I miss a dose? It is important not to miss a dose. Call your   doctor or health care professional if you are unable to keep an appointment. What may interact with this medicine? -medicines for seizures -medicines to increase blood counts like filgrastim, pegfilgrastim,  sargramostim -some antibiotics like amikacin, gentamicin, neomycin, streptomycin, tobramycin -vaccines Talk to your doctor or health care professional before taking any of these medicines: -acetaminophen -aspirin -ibuprofen -ketoprofen -naproxen This list may not describe all possible interactions. Give your health care provider a list of all the medicines, herbs, non-prescription drugs, or dietary supplements you use. Also tell them if you smoke, drink alcohol, or use illegal drugs. Some items may interact with your medicine. What should I watch for while using this medicine? Your condition will be monitored carefully while you are receiving this medicine. You will need important blood work done while you are taking this medicine. This drug may make you feel generally unwell. This is not uncommon, as chemotherapy can affect healthy cells as well as cancer cells. Report any side effects. Continue your course of treatment even though you feel ill unless your doctor tells you to stop. In some cases, you may be given additional medicines to help with side effects. Follow all directions for their use. Call your doctor or health care professional for advice if you get a fever, chills or sore throat, or other symptoms of a cold or flu. Do not treat yourself. This drug decreases your body's ability to fight infections. Try to avoid being around people who are sick. This medicine may increase your risk to bruise or bleed. Call your doctor or health care professional if you notice any unusual bleeding. Be careful brushing and flossing your teeth or using a toothpick because you may get an infection or bleed more easily. If you have any dental work done, tell your dentist you are receiving this medicine. Avoid taking products that contain aspirin, acetaminophen, ibuprofen, naproxen, or ketoprofen unless instructed by your doctor. These medicines may hide a fever. Do not become pregnant while taking this  medicine. Women should inform their doctor if they wish to become pregnant or think they might be pregnant. There is a potential for serious side effects to an unborn child. Talk to your health care professional or pharmacist for more information. Do not breast-feed an infant while taking this medicine. What side effects may I notice from receiving this medicine? Side effects that you should report to your doctor or health care professional as soon as possible: -allergic reactions like skin rash, itching or hives, swelling of the face, lips, or tongue -signs of infection - fever or chills, cough, sore throat, pain or difficulty passing urine -signs of decreased platelets or bleeding - bruising, pinpoint red spots on the skin, black, tarry stools, nosebleeds -signs of decreased red blood cells - unusually weak or tired, fainting spells, lightheadedness -breathing problems -changes in hearing -changes in vision -chest pain -high blood pressure -low blood counts - This drug may decrease the number of white blood cells, red blood cells and platelets. You may be at increased risk for infections and bleeding. -nausea and vomiting -pain, swelling, redness or irritation at the injection site -pain, tingling, numbness in the hands or feet -problems with balance, talking, walking -trouble passing urine or change in the amount of urine Side effects that usually do not require medical attention (report to your doctor or health care professional if they continue or are bothersome): -hair loss -loss of appetite -metallic taste in the mouth or changes in taste This list may not describe all   possible side effects. Call your doctor for medical advice about side effects. You may report side effects to FDA at 1-800-FDA-1088. Where should I keep my medicine? This drug is given in a hospital or clinic and will not be stored at home. NOTE: This sheet is a summary. It may not cover all possible information. If you  have questions about this medicine, talk to your doctor, pharmacist, or health care provider.  2015, Elsevier/Gold Standard. (2007-10-06 14:38:05)  

## 2014-03-31 NOTE — Progress Notes (Signed)
MD aware of BUN, CR, and Albumin levels. MD okay to proceed with treatment.

## 2014-03-31 NOTE — Telephone Encounter (Signed)
[employer]ET[employer] approved ONDANSETRON HCL TAB   Dates: 07/15/2013 - 04/01/2015      P: [phone]     COPY SCANNED

## 2014-03-31 NOTE — Progress Notes (Signed)
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 Swails is here today for follow-up and Cycle 2 of chemo. He has started radiation and has don well with it. He has had some issues with the chemo. He has a decreased appetite, he has not been drinking enough fluids, he has had hypotension, nausea and dizziness. His BP today is 103/60 HR 69. He is tired and tripped on the way into treatment without falling. We will stop his Norvasc and Lisinopril. His daughter states that he has not been taking his antiemetic medications as directed. He still has some wheezing and SOB. He denies fever, chills, cough, rash, vomiting, headache, chest pain, palpitations, abdominal pain, constipation, diarrhea, blood in urine or stool. He has had no swelling, tenderness, numbness or tingling in his extremities. He has had no bleeding or pain. His BUN and creatinine are a bit elevated today. We will give him fluids before chemo. His CBC looks ok today. We will continue to keep a close eye on him. His blood sugars have been doing okay.  CURRENT TREATMENT: Patient started radiation and chemotherapy within one week  REVIEW OF SYSTEMS: All other 10 point review of systems is negative except for those issues mentioned above.  PAST MEDICAL HISTORY: Past Medical History  Diagnosis Date  . Hypertension   . COPD (chronic obstructive pulmonary disease)   . GERD (gastroesophageal reflux disease)   . Renal insufficiency   . Hyperlipidemia   . CAD (coronary artery disease)   . B12 deficiency   . Memory loss   . Vertigo   . Diabetes mellitus     type II, poly neuropathy  . Overweight 01/31/2013  . Lung cancer, hilus  03/22/2014   PAST SURGICAL HISTORY: Past Surgical History  Procedure Laterality Date  . Coronary angioplasty with stent placement    . Inguinal hernia repair  2010  . Video bronchoscopy Bilateral 03/07/2014    Procedure: VIDEO BRONCHOSCOPY WITHOUT FLUORO;  Surgeon: Kathee Delton, MD;  Location: [facility];  Service: Cardiopulmonary;  Laterality: Bilateral;   FAMILY HISTORY Family History  Problem Relation Age of Onset  . Coronary artery disease Mother   . Breast cancer Mother   . Diabetes Mother   . Leukemia Brother    GYNECOLOGIC HISTORY:  No LMP for male patient.   SOCIAL HISTORY:  History   Social History  . Marital Status: Married    Spouse Name: N/A    Number of Children: 1  . Years of Education: N/A   Occupational History  . Retired     [employer]   Social History Main Topics  . Smoking status: Former Smoker -- 1.00 packs/day for 20 years    Types: Cigarettes    Start date: 09/09/1982    Quit date: 07/15/2002  . Smokeless tobacco: Never Used     Comment: quit smoking 11 years ago  . Alcohol Use: No  . Drug Use: Not on file  . Sexual Activity: Not on file   Other Topics Concern  . Not on file   Social History Narrative  . No narrative on file   ADVANCED DIRECTIVES: <no information>  HEALTH MAINTENANCE: History  Substance Use Topics  . Smoking status: Former  Smoker -- 1.00 packs/day for 20 years    Types: Cigarettes    Start date: 09/09/1982    Quit date: 07/15/2002  . Smokeless tobacco: Never Used     Comment: quit smoking 11 years ago  . Alcohol Use: No   Colonoscopy: PAP: Bone density: Lipid panel:  Allergies  Allergen Reactions  . Exenatide     REACTION: nausea    Current Outpatient Prescriptions  Medication Sig Dispense Refill  . albuterol (PROVENTIL HFA;VENTOLIN HFA) 108 (90 BASE) MCG/ACT inhaler Inhale 2 puffs into the lungs every 6 (six) hours as needed for wheezing or shortness of breath.      Marland Kitchen amLODipine (NORVASC) 10 MG  tablet Take 5 mg by mouth daily.      Marland Kitchen aspirin 81 MG tablet Take 81 mg by mouth daily.        Marland Kitchen atorvastatin (LIPITOR) 80 MG tablet Take 80 mg by mouth daily.      . budesonide-formoterol (SYMBICORT) 160-4.5 MCG/ACT inhaler Inhale 2 puffs into the lungs 2 (two) times daily.      . cefdinir (OMNICEF) 300 MG capsule Take 300 mg by mouth 2 (two) times daily.      . citalopram (CELEXA) 20 MG tablet Take 40 mg by mouth daily.      . insulin detemir (LEVEMIR) 100 UNIT/ML injection Inject 60 Units into the skin daily. May increase by 2 units every 3 days as directed      . lisinopril (PRINIVIL,ZESTRIL) 2.5 MG tablet Take 2.5 mg by mouth daily.      Marland Kitchen LORazepam (ATIVAN) 0.5 MG tablet Take 0.5 mg by mouth every 6 (six) hours as needed for anxiety.      . mometasone-formoterol (DULERA) 200-5 MCG/ACT AERO Inhale 2 puffs into the lungs 2 (two) times daily.      . ondansetron (ZOFRAN) 8 MG tablet Take 8 mg by mouth every 8 (eight) hours as needed for nausea or vomiting. Start after Chemo      . oxyCODONE-acetaminophen (PERCOCET/ROXICET) 5-325 MG per tablet Take 1 tablet by mouth every 4 (four) hours as needed for severe pain.      . pantoprazole (PROTONIX) 40 MG tablet Take 40 mg by mouth daily.      . prochlorperazine (COMPAZINE) 10 MG tablet Take 10 mg by mouth every 6 (six) hours as needed for nausea or vomiting.      . Wound Cleansers (RADIAPLEX EX) Apply topically.       No current facility-administered medications for this visit.   Facility-Administered Medications Ordered in Other Visits  Medication Dose Route Frequency Provider Last Rate Last Dose  . 0.9 %  sodium chloride infusion  1,000 mL Intravenous Once Volanda Napoleon, MD 500 mL/hr at 03/31/14 1325 1,000 mL at 03/31/14 1325  . CARBOplatin (PARAPLATIN) 120 mg in sodium chloride 0.9 % 100 mL chemo infusion  120 mg Intravenous Once Volanda Napoleon, MD      . famotidine (PEPCID) IVPB 20 mg  20 mg Intravenous Once Volanda Napoleon, MD   20 mg at  03/31/14 1357  . heparin lock flush 100 unit/mL  500 Units Intracatheter Once PRN Volanda Napoleon, MD      . PACLitaxel (TAXOL) 90 mg in sodium chloride 0.9 % 250 mL chemo infusion (</= 80mg /m2)  90 mg Intravenous Once Volanda Napoleon, MD      . sodium chloride 0.9 % injection 10 mL  10 mL Intracatheter PRN Volanda Napoleon, MD  OBJECTIVE: Filed Vitals:   03/31/14 1008  BP: 103/60  Pulse: 69  Temp: 97.4 F (36.3 C)  Resp: 26   Body mass index is 29.05 kg/(m^2). ECOG FS:0 - Asymptomatic Ocular: Sclerae unicteric, pupils equal, round and reactive to light Ear-nose-throat: Oropharynx clear, dentition fair Lymphatic: No cervical or supraclavicular adenopathy Lungs no rales or rhonchi, good excursion bilaterally Heart regular rate and rhythm, no murmur appreciated Abd soft, nontender, positive bowel sounds MSK no focal spinal tenderness, no joint edema Neuro: non-focal, well-oriented, appropriate affect  LAB RESULTS: CMP     Component Value Date/Time   NA 137 03/31/2014 0937   NA 135* 03/30/2014 1305   NA 134* 03/29/2014 1248   K 4.7 03/31/2014 0937   K 4.4 03/30/2014 1305   K 4.8 03/29/2014 1248   CL 99 03/31/2014 0937   CL 100 03/30/2014 1305   CO2 21 03/31/2014 0937   CO2 20 03/30/2014 1305   CO2 19* 03/29/2014 1248   GLUCOSE 113 03/31/2014 0937   GLUCOSE 95 03/30/2014 1305   GLUCOSE 177* 03/29/2014 1248   BUN 32* 03/31/2014 0937   BUN 39* 03/30/2014 1305   BUN 36.3* 03/29/2014 1248   CREATININE 2.0* 03/31/2014 0937   CREATININE 1.93* 03/30/2014 1305   CREATININE 2.0* 03/29/2014 1248   CALCIUM 8.6 03/31/2014 0937   CALCIUM 9.4 03/30/2014 1305   CALCIUM 9.4 03/29/2014 1248   PROT 6.8 03/31/2014 0937   PROT 6.9 03/22/2014 1109   ALBUMIN 3.4* 03/22/2014 1109   AST 23 03/31/2014 0937   AST 17 03/22/2014 1109   ALT 15 03/31/2014 0937   ALT 12 03/22/2014 1109   ALKPHOS 84 03/31/2014 0937   ALKPHOS 105 03/22/2014 1109   BILITOT 0.60 03/31/2014 0937   BILITOT 0.9 03/22/2014 1109   GFRNONAA 31*  03/30/2014 1305   GFRAA 36* 03/30/2014 1305   I No results found for this basename: SPEP, UPEP,  kappa and lambda light chains   Lab Results  Component Value Date   WBC 7.5 03/31/2014   NEUTROABS 6.2 03/31/2014   HGB 12.5* 03/31/2014   HCT 38.3* 03/31/2014   MCV 92 03/31/2014   PLT 243 03/31/2014   No results found for this basename: LABCA2   No components found with this basename: [id number]    Recent Labs Lab 03/30/14 1305  INR 1.01   STUDIES: None  ASSESSMENT/PLAN: Mr. Milbourne is 80 year old gentleman with locally advanced, inoperable non-small cell lung cancer of the left lung. This is squamous cell carcinoma. He is having issues with hypotension, nausea and dizziness.  We will continue with Cycle 2 of carboplatinum/Taxol today.   I discussed him taking his zofran the day of chemo and for a few days after around the clock in order to stave off nausea. He is going to try this.  He will stop taking his lisinopril and norvasc. Hopefully this will help him feels better. We will give him fluids today before chemo.  I messaged Rick and asked him to send his daughter a new schedule and also to make sure his chemo appointments are for the afternoon since he get radiation at 11am on those days.  We also gave him a script for a walker to help with mobility.  He knows to call here with any questions or concerns and to go to the ED in the event of an emergency. We can certainly see him sooner if need be.  Eliezer Bottom, NP 03/31/2014 2:05 PM

## 2014-04-01 ENCOUNTER — Telehealth: Payer: Self-pay | Admitting: Hematology & Oncology

## 2014-04-01 ENCOUNTER — Ambulatory Visit
Admission: RE | Admit: 2014-04-01 | Discharge: 2014-04-01 | Disposition: A | Discharge status: Home / Self Care | Payer: Medicare HMO | Source: Ambulatory Visit | Attending: Radiation Oncology | Admitting: Radiation Oncology

## 2014-04-01 ENCOUNTER — Ambulatory Visit: Payer: Medicare HMO

## 2014-04-01 DIAGNOSIS — Z510 Encounter for antineoplastic radiation therapy: Secondary | ICD-10-CM | POA: Diagnosis not present

## 2014-04-01 LAB — FUNGUS CULTURE W SMEAR
FUNGAL SMEAR: NONE SEEN
SPECIAL REQUESTS: NORMAL

## 2014-04-01 NOTE — Telephone Encounter (Signed)
Per Judson Roch Np orders to sch patient for 04/04/14 iron and inj.  Apt was scheduled and i called Lovey Newcomer (family) and gave there apt date and time for 04/04/14.

## 2014-04-01 NOTE — Telephone Encounter (Signed)
[id number] [id number] J1100 J7030 E5501 J7050 [id number]  Malignant neoplasm of hilus of lung, unspecified laterality - Primary 162.2   I spoke w Tanna Furry on today.   Ref: [id number]

## 2014-04-04 ENCOUNTER — Ambulatory Visit
Admission: RE | Admit: 2014-04-04 | Discharge: 2014-04-04 | Disposition: A | Discharge status: Home / Self Care | Payer: Medicare HMO | Source: Ambulatory Visit | Attending: Radiation Oncology | Admitting: Radiation Oncology

## 2014-04-04 ENCOUNTER — Ambulatory Visit: Payer: Medicare HMO

## 2014-04-04 ENCOUNTER — Ambulatory Visit (HOSPITAL_BASED_OUTPATIENT_CLINIC_OR_DEPARTMENT_OTHER): Payer: Medicare HMO

## 2014-04-04 ENCOUNTER — Other Ambulatory Visit: Payer: Self-pay | Admitting: Family

## 2014-04-04 VITALS — BP 97/46 | HR 75 | Temp 97.5°F | Resp 24

## 2014-04-04 DIAGNOSIS — C3490 Malignant neoplasm of unspecified part of unspecified bronchus or lung: Secondary | ICD-10-CM

## 2014-04-04 DIAGNOSIS — D509 Iron deficiency anemia, unspecified: Secondary | ICD-10-CM

## 2014-04-04 DIAGNOSIS — R7989 Other specified abnormal findings of blood chemistry: Secondary | ICD-10-CM

## 2014-04-04 DIAGNOSIS — Z510 Encounter for antineoplastic radiation therapy: Secondary | ICD-10-CM | POA: Diagnosis not present

## 2014-04-04 DIAGNOSIS — E291 Testicular hypofunction: Secondary | ICD-10-CM

## 2014-04-04 MED ORDER — TESTOSTERONE CYPIONATE 200 MG/ML IM SOLN
400.0000 mg | Freq: Once | INTRAMUSCULAR | Status: AC
  Administered 2014-04-04: 400 mg via INTRAMUSCULAR

## 2014-04-04 MED ORDER — HEPARIN SOD (PORK) LOCK FLUSH 100 UNIT/ML IV SOLN
500.0000 [IU] | Freq: Once | INTRAVENOUS | Status: AC
  Administered 2014-04-04: 500 [IU] via INTRAVENOUS
  Filled 2014-04-04: qty 5

## 2014-04-04 MED ORDER — TESTOSTERONE CYPIONATE 200 MG/ML IM SOLN
INTRAMUSCULAR | Status: AC
  Filled 2014-04-04: qty 2

## 2014-04-04 MED ORDER — SODIUM CHLORIDE 0.9 % IV SOLN
1020.0000 mg | Freq: Once | INTRAVENOUS | Status: AC
  Administered 2014-04-04: 1020 mg via INTRAVENOUS
  Filled 2014-04-04: qty 34

## 2014-04-04 MED ORDER — SODIUM CHLORIDE 0.9 % IJ SOLN
10.0000 mL | INTRAMUSCULAR | Status: DC | PRN
  Administered 2014-04-04: 10 mL via INTRAVENOUS
  Filled 2014-04-04: qty 10

## 2014-04-04 NOTE — Patient Instructions (Signed)
Testosterone injection What is this medicine? TESTOSTERONE (tes TOS ter one) is the main male hormone. It supports normal male development such as muscle growth, facial hair, and deep voice. It is used in males to treat low testosterone levels. This medicine may be used for other purposes; ask your health care provider or pharmacist if you have questions. COMMON BRAND NAME(S): Andro-L.A., Aveed, Delatestryl, Depo-Testosterone, Virilon What should I tell my health care provider before I take this medicine? They need to know if you have any of these conditions: -breast cancer -diabetes -heart disease -kidney disease -liver disease -lung disease -prostate cancer, enlargement -an unusual or allergic reaction to testosterone, other medicines, foods, dyes, or preservatives -pregnant or trying to get pregnant -breast-feeding How should I use this medicine? This medicine is for injection into a muscle. It is usually given by a health care professional in a hospital or clinic setting. Contact your pediatrician regarding the use of this medicine in children. While this medicine may be prescribed for children as young as 41 years of age for selected conditions, precautions do apply. Overdosage: If you think you have taken too much of this medicine contact a poison control center or emergency room at once. NOTE: This medicine is only for you. Do not share this medicine with others. What if I miss a dose? Try not to miss a dose. Your doctor or health care professional will tell you when your next injection is due. Notify the office if you are unable to keep an appointment. What may interact with this medicine? -medicines for diabetes -medicines that treat or prevent blood clots like warfarin -oxyphenbutazone -propranolol -steroid medicines like prednisone or cortisone This list may not describe all possible interactions. Give your health care provider a list of all the medicines, herbs,  non-prescription drugs, or dietary supplements you use. Also tell them if you smoke, drink alcohol, or use illegal drugs. Some items may interact with your medicine. What should I watch for while using this medicine? Visit your doctor or health care professional for regular checks on your progress. They will need to check the level of testosterone in your blood. This medicine is only approved for use in men who have low levels of testosterone related to certain medical conditions. Heart attacks and strokes have been reported with the use of this medicine. Notify your doctor or health care professional and seek emergency treatment if you develop breathing problems; changes in vision; confusion; chest pain or chest tightness; sudden arm pain; severe, sudden headache; trouble speaking or understanding; sudden numbness or weakness of the face, arm or leg; loss of balance or coordination. Talk to your doctor about the risks and benefits of this medicine. This medicine may affect blood sugar levels. If you have diabetes, check with your doctor or health care professional before you change your diet or the dose of your diabetic medicine. This drug is banned from use in athletes by most athletic organizations. What side effects may I notice from receiving this medicine? Side effects that you should report to your doctor or health care professional as soon as possible: -allergic reactions like skin rash, itching or hives, swelling of the face, lips, or tongue -breast enlargement -breathing problems -changes in mood, especially anger, depression, or rage -dark urine -general ill feeling or flu-like symptoms -light-colored stools -loss of appetite, nausea -nausea, vomiting -right upper belly pain -stomach pain -swelling of ankles -too frequent or persistent erections -trouble passing urine or change in the amount of urine -unusually  weak or tired -yellowing of the eyes or skin Additional side effects  that can occur in women include: -deep or hoarse voice -facial hair growth -irregular menstrual periods Side effects that usually do not require medical attention (report to your doctor or health care professional if they continue or are bothersome): -acne -change in sex drive or performance -hair loss -headache This list may not describe all possible side effects. Call your doctor for medical advice about side effects. You may report side effects to FDA at 1-800-FDA-1088. Where should I keep my medicine? Keep out of the reach of children. This medicine can be abused. Keep your medicine in a safe place to protect it from theft. Do not share this medicine with anyone. Selling or giving away this medicine is dangerous and against the law. Store at room temperature between 20 and 25 degrees C (68 and 77 degrees F). Do not freeze. Protect from light. Follow the directions for the product you are prescribed. Throw away any unused medicine after the expiration date. NOTE: This sheet is a summary. It may not cover all possible information. If you have questions about this medicine, talk to your doctor, pharmacist, or health care provider.  2015, Elsevier/Gold Standard. (2013-09-16 75:64:33) Ferumoxytol injection What is this medicine? FERUMOXYTOL is an iron complex. Iron is used to make healthy red blood cells, which carry oxygen and nutrients throughout the body. This medicine is used to treat iron deficiency anemia in people with chronic kidney disease. This medicine may be used for other purposes; ask your health care provider or pharmacist if you have questions. COMMON BRAND NAME(S): Feraheme What should I tell my health care provider before I take this medicine? They need to know if you have any of these conditions: -anemia not caused by low iron levels -high levels of iron in the blood -magnetic resonance imaging (MRI) test scheduled -an unusual or allergic reaction to iron, other medicines,  foods, dyes, or preservatives -pregnant or trying to get pregnant -breast-feeding How should I use this medicine? This medicine is for injection into a vein. It is given by a health care professional in a hospital or clinic setting. Talk to your pediatrician regarding the use of this medicine in children. Special care may be needed. Overdosage: If you think you've taken too much of this medicine contact a poison control center or emergency room at once. Overdosage: If you think you have taken too much of this medicine contact a poison control center or emergency room at once. NOTE: This medicine is only for you. Do not share this medicine with others. What if I miss a dose? It is important not to miss your dose. Call your doctor or health care professional if you are unable to keep an appointment. What may interact with this medicine? This medicine may interact with the following medications: -other iron products This list may not describe all possible interactions. Give your health care provider a list of all the medicines, herbs, non-prescription drugs, or dietary supplements you use. Also tell them if you smoke, drink alcohol, or use illegal drugs. Some items may interact with your medicine. What should I watch for while using this medicine? Visit your doctor or healthcare professional regularly. Tell your doctor or healthcare professional if your symptoms do not start to get better or if they get worse. You may need blood work done while you are taking this medicine. You may need to follow a special diet. Talk to your doctor. Foods that contain  iron include: whole grains/cereals, dried fruits, beans, or peas, leafy green vegetables, and organ meats (liver, kidney). What side effects may I notice from receiving this medicine? Side effects that you should report to your doctor or health care professional as soon as possible: -allergic reactions like skin rash, itching or hives, swelling of the  face, lips, or tongue -breathing problems -changes in blood pressure -feeling faint or lightheaded, falls -fever or chills -flushing, sweating, or hot feelings -swelling of the ankles or feet Side effects that usually do not require medical attention (Report these to your doctor or health care professional if they continue or are bothersome.): -diarrhea -headache -nausea, vomiting -stomach pain This list may not describe all possible side effects. Call your doctor for medical advice about side effects. You may report side effects to FDA at 1-800-FDA-1088. Where should I keep my medicine? This drug is given in a hospital or clinic and will not be stored at home. NOTE: This sheet is a summary. It may not cover all possible information. If you have questions about this medicine, talk to your doctor, pharmacist, or health care provider.  2015, Elsevier/Gold Standard. (2012-02-14 15:23:36)

## 2014-04-05 ENCOUNTER — Telehealth: Payer: Self-pay | Admitting: Oncology

## 2014-04-05 ENCOUNTER — Encounter: Payer: Self-pay | Admitting: Radiation Oncology

## 2014-04-05 ENCOUNTER — Ambulatory Visit: Payer: Medicare HMO

## 2014-04-05 ENCOUNTER — Ambulatory Visit
Admission: RE | Admit: 2014-04-05 | Discharge: 2014-04-05 | Disposition: A | Discharge status: Home / Self Care | Payer: Medicare HMO | Source: Ambulatory Visit | Attending: Radiation Oncology | Admitting: Radiation Oncology

## 2014-04-05 VITALS — BP 121/73 | HR 88 | Temp 97.6°F | Resp 20 | Ht 68.0 in | Wt 187.8 lb

## 2014-04-05 DIAGNOSIS — C3402 Malignant neoplasm of left main bronchus: Secondary | ICD-10-CM

## 2014-04-05 DIAGNOSIS — Z510 Encounter for antineoplastic radiation therapy: Secondary | ICD-10-CM | POA: Diagnosis not present

## 2014-04-05 MED ORDER — SUCRALFATE 1 GM/10ML PO SUSP: 1.0000 g | Freq: Three times a day (TID) | ORAL | Status: DC

## 2014-04-05 NOTE — Telephone Encounter (Signed)
[facility] called and said that Gregory Farrell insurance will not pay for the carafate suspension.  Per Dr. Sondra Come, it is ok to use the tablets.  Changes the prescription to

## 2014-04-05 NOTE — Progress Notes (Signed)
  Radiation Oncology         [phone] ________________________________  Name: Gregory Farrell MRN: [id number]  Date: 04/05/2014  DOB: [birth year]  Weekly Radiation Therapy Management  Lung cancer, hilus   Primary site: Lung   Staging method: AJCC 7th Edition   Clinical: Stage IIIA (T3, N2, M0)    Summary: Stage IIIA (T3, N2, M0)   Current Dose: 16.2 Gy     Planned Dose:  63 Gy  Narrative . . . . . . . . The patient presents for routine under treatment assessment.                                   The patient is without complaint except he has noticed some mild esophageal symptoms. Patient will be placed on Carafate for this issue.                                 Set-up films were reviewed.                                 The chart was checked. Physical Findings. . .  height is 5\' 8"  (1.727 m) and weight is 187 lb 12.8 oz (85.186 kg). His oral temperature is 97.6 F (36.4 C). His blood pressure is 121/73 and his pulse is 88. His respiration is 20 and oxygen saturation is 98%. . The lungs are clear. The heart has a regular rhythm and rate. Impression . . . . . . . The patient is tolerating radiation. Plan . . . . . . . . . . . . Continue treatment as planned.  ________________________________   Blair Promise, PhD, MD

## 2014-04-05 NOTE — Progress Notes (Signed)
Gregory Farrell has completed 9 fractions to his left lung.  He denies pain.  He had chemotherapy last Thursday and said he felt much better without nausea.  He does report a poor appetite.  His weight is stable from last week.  He reports a dry cough that it the same as before starting radiation.  He reports feeling a "tightness" in his throat when swallowing.  He said it started last night.  He reports shortness of breath with activity and feels weak.  His oxygen saturation today is 98% on room air.  He denies any skin irritation on his chest.

## 2014-04-05 NOTE — Telephone Encounter (Signed)
Called [facility] and advised him to dissolve the Carafate tablets in 10 ml of water before taking.  He verbalized understanding.

## 2014-04-05 NOTE — Telephone Encounter (Signed)
Called in prescription for sucralfate (CARAFATE) 1 G tablet 04/05/2014 Sig - Route: Take 1 g by mouth 4 (four) times daily - with meals and at bedtime. Dissolve in 10 ml of water before taking. Called in to [facility] per Dr. Sondra Come. Disp. 42 tablets. - Oral

## 2014-04-06 ENCOUNTER — Ambulatory Visit: Payer: Medicare HMO

## 2014-04-06 ENCOUNTER — Ambulatory Visit
Admission: RE | Admit: 2014-04-06 | Discharge: 2014-04-06 | Disposition: A | Discharge status: Home / Self Care | Payer: Medicare HMO | Source: Ambulatory Visit | Attending: Radiation Oncology | Admitting: Radiation Oncology

## 2014-04-06 DIAGNOSIS — Z510 Encounter for antineoplastic radiation therapy: Secondary | ICD-10-CM | POA: Diagnosis not present

## 2014-04-07 ENCOUNTER — Ambulatory Visit: Payer: Medicare HMO | Admitting: Family Medicine

## 2014-04-07 ENCOUNTER — Ambulatory Visit (HOSPITAL_BASED_OUTPATIENT_CLINIC_OR_DEPARTMENT_OTHER): Payer: Medicare HMO

## 2014-04-07 ENCOUNTER — Other Ambulatory Visit (HOSPITAL_BASED_OUTPATIENT_CLINIC_OR_DEPARTMENT_OTHER): Payer: Medicare HMO | Admitting: Lab

## 2014-04-07 ENCOUNTER — Ambulatory Visit
Admission: RE | Admit: 2014-04-07 | Discharge: 2014-04-07 | Disposition: A | Discharge status: Home / Self Care | Payer: Medicare HMO | Source: Ambulatory Visit | Attending: Radiation Oncology | Admitting: Radiation Oncology

## 2014-04-07 ENCOUNTER — Ambulatory Visit: Payer: Medicare HMO

## 2014-04-07 ENCOUNTER — Encounter: Payer: Self-pay | Admitting: Hematology & Oncology

## 2014-04-07 DIAGNOSIS — Z510 Encounter for antineoplastic radiation therapy: Secondary | ICD-10-CM | POA: Diagnosis not present

## 2014-04-07 DIAGNOSIS — C3400 Malignant neoplasm of unspecified main bronchus: Secondary | ICD-10-CM

## 2014-04-07 DIAGNOSIS — Z5111 Encounter for antineoplastic chemotherapy: Secondary | ICD-10-CM

## 2014-04-07 DIAGNOSIS — C3402 Malignant neoplasm of left main bronchus: Secondary | ICD-10-CM

## 2014-04-07 DIAGNOSIS — R918 Other nonspecific abnormal finding of lung field: Secondary | ICD-10-CM

## 2014-04-07 LAB — CMP (CANCER CENTER ONLY)
ALT(SGPT): 14 U/L (ref 10–47)
AST: 19 U/L (ref 11–38)
Albumin: 3 g/dL — ABNORMAL LOW (ref 3.3–5.5)
Alkaline Phosphatase: 94 U/L — ABNORMAL HIGH (ref 26–84)
BILIRUBIN TOTAL: 0.7 mg/dL (ref 0.20–1.60)
BUN: 16 mg/dL (ref 7–22)
CO2: 24 mEq/L (ref 18–33)
CREATININE: 1.6 mg/dL — AB (ref 0.6–1.2)
Calcium: 9.3 mg/dL (ref 8.0–10.3)
Chloride: 100 mEq/L (ref 98–108)
Glucose, Bld: 193 mg/dL — ABNORMAL HIGH (ref 73–118)
Potassium: 3.9 mEq/L (ref 3.3–4.7)
Sodium: 139 mEq/L (ref 128–145)
Total Protein: 7 g/dL (ref 6.4–8.1)

## 2014-04-07 LAB — CBC WITH DIFFERENTIAL (CANCER CENTER ONLY)
BASO#: 0 10*3/uL (ref 0.0–0.2)
BASO%: 0.4 % (ref 0.0–2.0)
EOS%: 2.2 % (ref 0.0–7.0)
Eosinophils Absolute: 0.1 10*3/uL (ref 0.0–0.5)
HEMATOCRIT: 39 % (ref 38.7–49.9)
HEMOGLOBIN: 12.9 g/dL — AB (ref 13.0–17.1)
LYMPH#: 0.4 10*3/uL — AB (ref 0.9–3.3)
LYMPH%: 17 % (ref 14.0–48.0)
MCH: 30.2 pg (ref 28.0–33.4)
MCHC: 33.1 g/dL (ref 32.0–35.9)
MCV: 91 fL (ref 82–98)
MONO#: 0.2 10*3/uL (ref 0.1–0.9)
MONO%: 7.6 % (ref 0.0–13.0)
NEUT%: 72.8 % (ref 40.0–80.0)
NEUTROS ABS: 1.6 10*3/uL (ref 1.5–6.5)
Platelets: 129 10*3/uL — ABNORMAL LOW (ref 145–400)
RBC: 4.27 10*6/uL (ref 4.20–5.70)
RDW: 14.3 % (ref 11.1–15.7)
WBC: 2.2 10*3/uL — AB (ref 4.0–10.0)

## 2014-04-07 MED ORDER — SODIUM CHLORIDE 0.9 % IV SOLN
Freq: Once | INTRAVENOUS | Status: AC
  Administered 2014-04-07: 14:00:00 via INTRAVENOUS

## 2014-04-07 MED ORDER — FAMOTIDINE IN NACL 20-0.9 MG/50ML-% IV SOLN
20.0000 mg | Freq: Once | INTRAVENOUS | Status: AC
  Administered 2014-04-07: 20 mg via INTRAVENOUS

## 2014-04-07 MED ORDER — ONDANSETRON 16 MG/50ML IVPB (CHCC)
16.0000 mg | Freq: Once | INTRAVENOUS | Status: AC
  Administered 2014-04-07: 16 mg via INTRAVENOUS

## 2014-04-07 MED ORDER — FAMOTIDINE IN NACL 20-0.9 MG/50ML-% IV SOLN
INTRAVENOUS | Status: AC
  Filled 2014-04-07: qty 50

## 2014-04-07 MED ORDER — PACLITAXEL CHEMO INJECTION 300 MG/50ML
45.0000 mg/m2 | Freq: Once | INTRAVENOUS | Status: AC
  Administered 2014-04-07: 90 mg via INTRAVENOUS
  Filled 2014-04-07: qty 15

## 2014-04-07 MED ORDER — SODIUM CHLORIDE 0.9 % IJ SOLN
10.0000 mL | INTRAMUSCULAR | Status: DC | PRN
  Administered 2014-04-07: 10 mL
  Filled 2014-04-07: qty 10

## 2014-04-07 MED ORDER — SODIUM CHLORIDE 0.9 % IV SOLN
123.4000 mg | Freq: Once | INTRAVENOUS | Status: AC
  Administered 2014-04-07: 120 mg via INTRAVENOUS
  Filled 2014-04-07: qty 12

## 2014-04-07 MED ORDER — HEPARIN SOD (PORK) LOCK FLUSH 100 UNIT/ML IV SOLN
500.0000 [IU] | Freq: Once | INTRAVENOUS | Status: AC | PRN
  Administered 2014-04-07: 500 [IU]
  Filled 2014-04-07: qty 5

## 2014-04-07 MED ORDER — DEXAMETHASONE SODIUM PHOSPHATE 20 MG/5ML IJ SOLN
20.0000 mg | Freq: Once | INTRAMUSCULAR | Status: AC
  Administered 2014-04-07: 20 mg via INTRAVENOUS

## 2014-04-07 MED ORDER — DIPHENHYDRAMINE HCL 50 MG/ML IJ SOLN
INTRAMUSCULAR | Status: AC
  Filled 2014-04-07: qty 1

## 2014-04-07 MED ORDER — DIPHENHYDRAMINE HCL 50 MG/ML IJ SOLN
50.0000 mg | Freq: Once | INTRAMUSCULAR | Status: AC
  Administered 2014-04-07: 50 mg via INTRAVENOUS

## 2014-04-07 MED ORDER — ONDANSETRON 16 MG/50ML IVPB (CHCC)
INTRAVENOUS | Status: AC
  Filled 2014-04-07: qty 16

## 2014-04-07 MED ORDER — DIPHENHYDRAMINE HCL 25 MG PO CAPS
ORAL_CAPSULE | ORAL | Status: AC
  Filled 2014-04-07: qty 1

## 2014-04-07 NOTE — Patient Instructions (Signed)
[facility] Discharge Instructions for Patients Receiving Chemotherapy  Today you received the following chemotherapy agents Carbo, Taxol  To help prevent nausea and vomiting after your treatment, we encourage you to take your nausea medication    If you develop nausea and vomiting that is not controlled by your nausea medication, call the clinic.   BELOW ARE SYMPTOMS THAT SHOULD BE REPORTED IMMEDIATELY:  *FEVER GREATER THAN 100.5 F  *CHILLS WITH OR WITHOUT FEVER  NAUSEA AND VOMITING THAT IS NOT CONTROLLED WITH YOUR NAUSEA MEDICATION  *UNUSUAL SHORTNESS OF BREATH  *UNUSUAL BRUISING OR BLEEDING  TENDERNESS IN MOUTH AND THROAT WITH OR WITHOUT PRESENCE OF ULCERS  *URINARY PROBLEMS  *BOWEL PROBLEMS  UNUSUAL RASH Items with * indicate a potential emergency and should be followed up as soon as possible.  Feel free to call the clinic you have any questions or concerns. The clinic phone number is [phone].

## 2014-04-08 ENCOUNTER — Other Ambulatory Visit: Payer: Self-pay | Admitting: Family Medicine

## 2014-04-08 ENCOUNTER — Ambulatory Visit
Admission: RE | Admit: 2014-04-08 | Discharge: 2014-04-08 | Disposition: A | Discharge status: Home / Self Care | Payer: Medicare HMO | Source: Ambulatory Visit | Attending: Radiation Oncology | Admitting: Radiation Oncology

## 2014-04-08 ENCOUNTER — Ambulatory Visit: Payer: Medicare HMO

## 2014-04-08 DIAGNOSIS — Z510 Encounter for antineoplastic radiation therapy: Secondary | ICD-10-CM | POA: Diagnosis not present

## 2014-04-08 LAB — FERRITIN CHCC: Ferritin: 1729 ng/ml — ABNORMAL HIGH (ref 22–316)

## 2014-04-08 LAB — IRON AND TIBC CHCC
IRON: 935 ug/dL — AB (ref 42–163)
TIBC: 173 ug/dL — AB (ref 202–409)
UIBC: <1 ug/dL (ref 117–376)

## 2014-04-08 LAB — PREALBUMIN: Prealbumin: 22 mg/dL (ref 17.0–34.0)

## 2014-04-08 NOTE — Progress Notes (Signed)
Mr. Notz was given testosterone on 04/04/14 for low testosterone level of 64.

## 2014-04-11 ENCOUNTER — Ambulatory Visit
Admission: RE | Admit: 2014-04-11 | Discharge: 2014-04-11 | Disposition: A | Discharge status: Home / Self Care | Payer: Medicare HMO | Source: Ambulatory Visit | Attending: Radiation Oncology | Admitting: Radiation Oncology

## 2014-04-11 ENCOUNTER — Encounter: Payer: Self-pay | Admitting: Nurse Practitioner

## 2014-04-11 ENCOUNTER — Ambulatory Visit: Payer: Medicare HMO

## 2014-04-11 ENCOUNTER — Other Ambulatory Visit: Payer: Self-pay | Admitting: Nurse Practitioner

## 2014-04-11 VITALS — BP 89/56 | HR 113 | Temp 97.7°F | Resp 16 | Ht 68.0 in | Wt 181.2 lb

## 2014-04-11 DIAGNOSIS — C3402 Malignant neoplasm of left main bronchus: Secondary | ICD-10-CM

## 2014-04-11 DIAGNOSIS — Z510 Encounter for antineoplastic radiation therapy: Secondary | ICD-10-CM | POA: Diagnosis not present

## 2014-04-11 MED ORDER — MORPHINE SULFATE (CONCENTRATE) 20 MG/ML PO SOLN: ORAL | Status: DC

## 2014-04-11 MED ORDER — LOPERAMIDE HCL 1 MG/5ML PO LIQD: 2.0000 mg | ORAL | Status: DC | PRN

## 2014-04-11 NOTE — Progress Notes (Signed)
Gregory Farrell has completed 13/35 fractions to his left lung.  He denies pain.  He reports trouble with swallowing and is hardly eating or drinking anything.  He is taking Carafate 4 times a day which he says is not helping.  He has lost 6 lbs since 9/22.  He reports feeling weak.  Orthostatic vitals done: BP sitting 100/63, hr 102, BP standing 89/56, hr 113.  He reports his shortness of breath is the same and his cough has improved.  He also reports having bright red blood in his stools.  He reports seeing the blood three times since last night.  He last chemotherapy on Thursday.

## 2014-04-11 NOTE — Progress Notes (Signed)
Received a message from [facility] stating the pt only received 61ml of Roxanol due to this was the only quantity available.

## 2014-04-11 NOTE — Progress Notes (Signed)
Department of Radiation Oncology  Phone:  [phone] Fax:        [phone]  Weekly Treatment Note    Name: Gregory Farrell Date: 04/11/2014 MRN: [id number] DOB: 03-26-1933   Current dose: 23.4 Gy  Current fraction: 13   MEDICATIONS: Current Outpatient Prescriptions  Medication Sig Dispense Refill  . albuterol (PROVENTIL HFA;VENTOLIN HFA) 108 (90 BASE) MCG/ACT inhaler Inhale 2 puffs into the lungs every 6 (six) hours as needed for wheezing or shortness of breath.      Marland Kitchen aspirin 81 MG tablet Take 81 mg by mouth daily.        Marland Kitchen atorvastatin (LIPITOR) 80 MG tablet TAKE ONE TABLET BY MOUTH ONE TIME DAILY   90 tablet  1  . budesonide-formoterol (SYMBICORT) 160-4.5 MCG/ACT inhaler Inhale 2 puffs into the lungs 2 (two) times daily.      . cefdinir (OMNICEF) 300 MG capsule Take 300 mg by mouth 2 (two) times daily.      . citalopram (CELEXA) 20 MG tablet TAKE TWO TABLETS BY MOUTH DAILY   60 tablet  3  . insulin detemir (LEVEMIR) 100 UNIT/ML injection Inject 60 Units into the skin daily. May increase by 2 units every 3 days as directed      . LORazepam (ATIVAN) 0.5 MG tablet Take 0.5 mg by mouth every 6 (six) hours as needed for anxiety.      . mometasone-formoterol (DULERA) 200-5 MCG/ACT AERO Inhale 2 puffs into the lungs 2 (two) times daily.      . ondansetron (ZOFRAN) 8 MG tablet Take 8 mg by mouth every 8 (eight) hours as needed for nausea or vomiting. Start after Chemo      . oxyCODONE-acetaminophen (PERCOCET/ROXICET) 5-325 MG per tablet Take 1 tablet by mouth every 4 (four) hours as needed for severe pain.      . pantoprazole (PROTONIX) 40 MG tablet Take 40 mg by mouth daily.      . prochlorperazine (COMPAZINE) 10 MG tablet Take 10 mg by mouth every 6 (six) hours as needed for nausea or vomiting.      . sucralfate (CARAFATE) 1 G tablet Take 1 g by mouth 4 (four) times daily -  with meals and at bedtime. Dissolve in 10 ml of water before taking.  Called in to [facility] per Dr.  Sondra Come. Disp. 42 tablets.      . sucralfate (CARAFATE) 1 GM/10ML suspension Take 10 mLs (1 g total) by mouth 4 (four) times daily -  with meals and at bedtime.  420 mL  0  . Wound Cleansers (RADIAPLEX EX) Apply topically.       No current facility-administered medications for this encounter.     ALLERGIES: Exenatide   LABORATORY DATA:  Lab Results  Component Value Date   WBC 2.2* 04/07/2014   HGB 12.9* 04/07/2014   HCT 39.0 04/07/2014   MCV 91 04/07/2014   PLT 129* 04/07/2014   Lab Results  Component Value Date   NA 139 04/07/2014   K 3.9 04/07/2014   CL 100 04/07/2014   CO2 24 04/07/2014   Lab Results  Component Value Date   ALT 14 04/07/2014   AST 19 04/07/2014   ALKPHOS 94* 04/07/2014   BILITOT 0.70 04/07/2014     NARRATIVE: Gregory Farrell was seen today for weekly treatment management. The chart was checked and the patient's films were reviewed. The patient asked to be seen today. He is complaining of worsening esophagitis. This really bother him  over the weekend. Orthostatics have been obtained and he does appear low on fluid today to some degree. The patient also has noticed some blood in his stools over the last day. He has not noticed a lot but this has been present over several stools.  PHYSICAL EXAMINATION: height is 5\' 8"  (1.727 m) and weight is 181 lb 3.2 oz (82.192 kg). His oral temperature is 97.7 F (36.5 C). His blood pressure is 89/56 and his pulse is 113. His respiration is 16 and oxygen saturation is 96%.        ASSESSMENT: The patient is doing satisfactorily with treatment.  PLAN: We will continue with the patient's radiation treatment as planned. The patient has pain medication at home but he has not been using this. He will begin using Percocet as directed 1 time every 4 hours as needed. He will also continue taking Protonix medication as well as Carafate medication. The patient will be seen again tomorrow and we will repeat orthostatic measurements on him. I  discussed possible IV fluids today but they wished to try to increase his intake tonight and recheck this tomorrow. In regards to his blood in his stool, I discussed with him that this was not related to his radiation treatment directly. His blood counts looked okay last week. He was going to call Dr. Antonieta Pert off this to inform them of this change. Dr. Sondra Come will see the patient tomorrow in our clinic.

## 2014-04-12 ENCOUNTER — Ambulatory Visit: Payer: Medicare HMO

## 2014-04-12 ENCOUNTER — Non-Acute Institutional Stay (HOSPITAL_COMMUNITY): Admission: AD | Admit: 2014-04-12 | Payer: Medicare HMO | Source: Ambulatory Visit | Admitting: Radiation Oncology

## 2014-04-12 ENCOUNTER — Other Ambulatory Visit: Payer: Self-pay | Admitting: Oncology

## 2014-04-12 ENCOUNTER — Encounter: Payer: Self-pay | Admitting: Radiation Oncology

## 2014-04-12 ENCOUNTER — Ambulatory Visit
Admission: RE | Admit: 2014-04-12 | Discharge: 2014-04-12 | Disposition: A | Discharge status: Home / Self Care | Payer: Medicare HMO | Source: Ambulatory Visit | Attending: Radiation Oncology | Admitting: Radiation Oncology

## 2014-04-12 VITALS — BP 82/48 | HR 102 | Temp 97.7°F | Resp 20 | Ht 68.0 in | Wt 182.5 lb

## 2014-04-12 DIAGNOSIS — C3402 Malignant neoplasm of left main bronchus: Secondary | ICD-10-CM

## 2014-04-12 DIAGNOSIS — Z510 Encounter for antineoplastic radiation therapy: Secondary | ICD-10-CM | POA: Diagnosis not present

## 2014-04-12 LAB — CBC WITH DIFFERENTIAL/PLATELET
BASO%: 1.2 % (ref 0.0–2.0)
BASOS ABS: 0 10*3/uL (ref 0.0–0.1)
EOS ABS: 0 10*3/uL (ref 0.0–0.5)
EOS%: 1.9 % (ref 0.0–7.0)
HEMATOCRIT: 37.4 % — AB (ref 38.4–49.9)
HEMOGLOBIN: 11.8 g/dL — AB (ref 13.0–17.1)
LYMPH%: 13.2 % — ABNORMAL LOW (ref 14.0–49.0)
MCH: 28.7 pg (ref 27.2–33.4)
MCHC: 31.6 g/dL — ABNORMAL LOW (ref 32.0–36.0)
MCV: 90.7 fL (ref 79.3–98.0)
MONO#: 0.1 10*3/uL (ref 0.1–0.9)
MONO%: 6 % (ref 0.0–14.0)
NEUT%: 77.7 % — AB (ref 39.0–75.0)
NEUTROS ABS: 1 10*3/uL — AB (ref 1.5–6.5)
Platelets: 99 10*3/uL — ABNORMAL LOW (ref 140–400)
RBC: 4.13 10*6/uL — ABNORMAL LOW (ref 4.20–5.82)
RDW: 14.9 % — ABNORMAL HIGH (ref 11.0–14.6)
WBC: 1.3 10*3/uL — AB (ref 4.0–10.3)
lymph#: 0.2 10*3/uL — ABNORMAL LOW (ref 0.9–3.3)

## 2014-04-12 LAB — BASIC METABOLIC PANEL (CC13)
Anion Gap: 8 mEq/L (ref 3–11)
BUN: 22.3 mg/dL (ref 7.0–26.0)
CALCIUM: 9.2 mg/dL (ref 8.4–10.4)
CO2: 26 meq/L (ref 22–29)
Chloride: 102 mEq/L (ref 98–109)
Creatinine: 1.5 mg/dL — ABNORMAL HIGH (ref 0.7–1.3)
GLUCOSE: 158 mg/dL — AB (ref 70–140)
Potassium: 4.6 mEq/L (ref 3.5–5.1)
Sodium: 136 mEq/L (ref 136–145)

## 2014-04-12 MED ORDER — OXYCODONE-ACETAMINOPHEN 5-325 MG/5ML PO SOLN: 5.0000 mL | ORAL | Status: DC | PRN

## 2014-04-12 MED ORDER — SODIUM CHLORIDE 0.9 % IV SOLN
Freq: Once | INTRAVENOUS | Status: AC
  Administered 2014-04-12: 14:00:00 via INTRAVENOUS

## 2014-04-12 MED ORDER — HEPARIN SOD (PORK) LOCK FLUSH 100 UNIT/ML IV SOLN
500.0000 [IU] | Freq: Once | INTRAVENOUS | Status: AC
  Administered 2014-04-12: 500 [IU] via INTRAVENOUS
  Filled 2014-04-12: qty 5

## 2014-04-12 MED ORDER — OXYCODONE-ACETAMINOPHEN 5-325 MG PO TABS
1.0000 | ORAL_TABLET | Freq: Once | ORAL | Status: AC
  Administered 2014-04-12: 1 via ORAL
  Filled 2014-04-12: qty 1

## 2014-04-12 MED ORDER — SODIUM CHLORIDE 0.9 % IJ SOLN
10.0000 mL | INTRAMUSCULAR | Status: DC | PRN
  Administered 2014-04-12: 10 mL via INTRAVENOUS
  Filled 2014-04-12: qty 10

## 2014-04-12 NOTE — Progress Notes (Signed)
Patient given 1 percocet tablet crushed and mixed with applesauce per Dr. Sondra Come.  Patient tolerated well.  He was sent up in a wheelchair with his daughter to register for infusion.

## 2014-04-12 NOTE — Progress Notes (Signed)
Patient reports feeling weak.  Weight is up 1 lb from yesterday.  Orthostatic vitals done: bp sitting 102/66, hr 91, bp standing 82/48, hr 102.  He reports pain in his abdomen when swallowing.  He had trouble swallowing percocet tablet yesterday so tried cutting it into little pieces and was able to swallow it.  He reports it gave him pain relief.  Also got prescription for liquid morphine that did not help.  He has not been able to eat or drink this morning.  He has not had any diarrhea/bloody stools today.  He reports shortness of breath is improving.  He denies a cough.  His skin is intact on his chest.

## 2014-04-12 NOTE — Patient Instructions (Signed)
Dehydration, Adult Dehydration is when you lose more fluids from the body than you take in. Vital organs like the kidneys, brain, and heart cannot function without a proper amount of fluids and salt. Any loss of fluids from the body can cause dehydration.  CAUSES   Vomiting.  Diarrhea.  Excessive sweating.  Excessive urine output.  Fever. SYMPTOMS  Mild dehydration  Thirst.  Dry lips.  Slightly dry mouth. Moderate dehydration  Very dry mouth.  Sunken eyes.  Skin does not bounce back quickly when lightly pinched and released.  Dark urine and decreased urine production.  Decreased tear production.  Headache. Severe dehydration  Very dry mouth.  Extreme thirst.  Rapid, weak pulse (more than 100 beats per minute at rest).  Cold hands and feet.  Not able to sweat in spite of heat and temperature.  Rapid breathing.  Blue lips.  Confusion and lethargy.  Difficulty being awakened.  Minimal urine production.  No tears. DIAGNOSIS  Your caregiver will diagnose dehydration based on your symptoms and your exam. Blood and urine tests will help confirm the diagnosis. The diagnostic evaluation should also identify the cause of dehydration. TREATMENT  Treatment of mild or moderate dehydration can often be done at home by increasing the amount of fluids that you drink. It is best to drink small amounts of fluid more often. Drinking too much at one time can make vomiting worse. Refer to the home care instructions below. Severe dehydration needs to be treated at the hospital where you will probably be given intravenous (IV) fluids that contain water and electrolytes. HOME CARE INSTRUCTIONS   Ask your caregiver about specific rehydration instructions.  Drink enough fluids to keep your urine clear or pale yellow.  Drink small amounts frequently if you have nausea and vomiting.  Eat as you normally do.  Avoid:  Foods or drinks high in sugar.  Carbonated  drinks.  Juice.  Extremely hot or cold fluids.  Drinks with caffeine.  Fatty, greasy foods.  Alcohol.  Tobacco.  Overeating.  Gelatin desserts.  Wash your hands well to avoid spreading bacteria and viruses.  Only take over-the-counter or prescription medicines for pain, discomfort, or fever as directed by your caregiver.  Ask your caregiver if you should continue all prescribed and over-the-counter medicines.  Keep all follow-up appointments with your caregiver. SEEK MEDICAL CARE IF:  You have abdominal pain and it increases or stays in one area (localizes).  You have a rash, stiff neck, or severe headache.  You are irritable, sleepy, or difficult to awaken.  You are weak, dizzy, or extremely thirsty. SEEK IMMEDIATE MEDICAL CARE IF:   You are unable to keep fluids down or you get worse despite treatment.  You have frequent episodes of vomiting or diarrhea.  You have blood or green matter (bile) in your vomit.  You have blood in your stool or your stool looks black and tarry.  You have not urinated in 6 to 8 hours, or you have only urinated a small amount of very dark urine.  You have a fever.  You faint. MAKE SURE YOU:   Understand these instructions.  Will watch your condition.  Will get help right away if you are not doing well or get worse. Document Released: 07/01/2005 Document Revised: 09/23/2011 Document Reviewed: 02/18/2011 ExitCare Patient Information 2015 [employer]. This information is not intended to replace advice given to you by your health care provider. Make sure you discuss any questions you have with your health care   provider.  

## 2014-04-12 NOTE — Progress Notes (Signed)
  Radiation Oncology         [phone] ________________________________  Name: Gregory Farrell MRN: [id number]  Date: 04/12/2014  DOB: 1933-05-02  Weekly Radiation Therapy Management  Lung cancer, hilus   Primary site: Lung   Staging method: AJCC 7th Edition   Clinical: Stage IIIA (T3, N2, M0)    Summary: Stage IIIA (T3, N2, M0)   Current Dose: 25.2 Gy     Planned Dose:  63 Gy  Narrative . . . . . . . . The patient presents for routine under treatment assessment.                                   The patient is having significant esophageal symptoms. he had very little by mouth intake at this time. Patient was given liquid morphine by medical oncology yesterday which patient did not feel helps his pain. He did use a crush.Percocet yesterday which was much helpful for his pain issues.                                 Set-up films were reviewed.                                 The chart was checked. Physical Findings. . . the patient shows orthostatic blood pressure changes. Oxygen saturation 95% on room air. The lungs are clear. The heart has a regular rhythm and rate Impression . . . . . . . The patient is tolerating radiation. Plan . . . . . . . . . . . . Continue treatment as planned. The patient will be set up for IV fluid supplementation this afternoon. I've also ordered blood work.  The patient will be given oxycodone in liquid form since this seems to be more helpful for him. If  The patient continues to have significant esophageal symptoms we may consider breaking treatment with a long weekend.  ________________________________   Blair Promise, PhD, MD

## 2014-04-13 ENCOUNTER — Ambulatory Visit: Payer: Medicare HMO

## 2014-04-13 ENCOUNTER — Ambulatory Visit
Admission: RE | Admit: 2014-04-13 | Discharge: 2014-04-13 | Disposition: A | Discharge status: Home / Self Care | Payer: Medicare HMO | Source: Ambulatory Visit | Attending: Radiation Oncology | Admitting: Radiation Oncology

## 2014-04-13 DIAGNOSIS — Z510 Encounter for antineoplastic radiation therapy: Secondary | ICD-10-CM | POA: Diagnosis not present

## 2014-04-14 ENCOUNTER — Ambulatory Visit: Payer: Medicare HMO

## 2014-04-14 ENCOUNTER — Ambulatory Visit (HOSPITAL_BASED_OUTPATIENT_CLINIC_OR_DEPARTMENT_OTHER): Payer: Medicare HMO | Admitting: Lab

## 2014-04-14 ENCOUNTER — Ambulatory Visit
Admission: RE | Admit: 2014-04-14 | Discharge: 2014-04-14 | Disposition: A | Discharge status: Home / Self Care | Payer: Medicare HMO | Source: Ambulatory Visit | Attending: Radiation Oncology | Admitting: Radiation Oncology

## 2014-04-14 ENCOUNTER — Ambulatory Visit (HOSPITAL_BASED_OUTPATIENT_CLINIC_OR_DEPARTMENT_OTHER): Payer: Medicare HMO

## 2014-04-14 VITALS — BP 123/64 | HR 82 | Temp 97.7°F | Resp 16

## 2014-04-14 DIAGNOSIS — Z9289 Personal history of other medical treatment: Secondary | ICD-10-CM

## 2014-04-14 DIAGNOSIS — C3402 Malignant neoplasm of left main bronchus: Secondary | ICD-10-CM

## 2014-04-14 DIAGNOSIS — Z510 Encounter for antineoplastic radiation therapy: Secondary | ICD-10-CM | POA: Diagnosis not present

## 2014-04-14 DIAGNOSIS — D509 Iron deficiency anemia, unspecified: Secondary | ICD-10-CM

## 2014-04-14 DIAGNOSIS — I82409 Acute embolism and thrombosis of unspecified deep veins of unspecified lower extremity: Secondary | ICD-10-CM

## 2014-04-14 DIAGNOSIS — R52 Pain, unspecified: Secondary | ICD-10-CM

## 2014-04-14 HISTORY — DX: Personal history of other medical treatment: Z92.89

## 2014-04-14 HISTORY — DX: Acute embolism and thrombosis of unspecified deep veins of unspecified lower extremity: I82.409

## 2014-04-14 LAB — CMP (CANCER CENTER ONLY)
ALT: 15 U/L (ref 10–47)
AST: 16 U/L (ref 11–38)
Albumin: 3 g/dL — ABNORMAL LOW (ref 3.3–5.5)
Alkaline Phosphatase: 85 U/L — ABNORMAL HIGH (ref 26–84)
BUN, Bld: 19 mg/dL (ref 7–22)
CALCIUM: 8.9 mg/dL (ref 8.0–10.3)
CHLORIDE: 96 meq/L — AB (ref 98–108)
CO2: 26 meq/L (ref 18–33)
CREATININE: 1.4 mg/dL — AB (ref 0.6–1.2)
Glucose, Bld: 112 mg/dL (ref 73–118)
Potassium: 4 mEq/L (ref 3.3–4.7)
SODIUM: 138 meq/L (ref 128–145)
TOTAL PROTEIN: 6.6 g/dL (ref 6.4–8.1)
Total Bilirubin: 0.6 mg/dl (ref 0.20–1.60)

## 2014-04-14 LAB — PREALBUMIN: Prealbumin: 22.3 mg/dL (ref 17.0–34.0)

## 2014-04-14 LAB — CBC WITH DIFFERENTIAL (CANCER CENTER ONLY)
BASO#: 0 10*3/uL (ref 0.0–0.2)
BASO%: 0.5 % (ref 0.0–2.0)
EOS%: 1.6 % (ref 0.0–7.0)
Eosinophils Absolute: 0 10*3/uL (ref 0.0–0.5)
HCT: 37.2 % — ABNORMAL LOW (ref 38.7–49.9)
HGB: 12 g/dL — ABNORMAL LOW (ref 13.0–17.1)
LYMPH#: 0.3 10*3/uL — ABNORMAL LOW (ref 0.9–3.3)
LYMPH%: 18 % (ref 14.0–48.0)
MCH: 29.9 pg (ref 28.0–33.4)
MCHC: 32.3 g/dL (ref 32.0–35.9)
MCV: 93 fL (ref 82–98)
MONO#: 0.2 10*3/uL (ref 0.1–0.9)
MONO%: 10.1 % (ref 0.0–13.0)
NEUT%: 69.8 % (ref 40.0–80.0)
NEUTROS ABS: 1.3 10*3/uL — AB (ref 1.5–6.5)
Platelets: 95 10*3/uL — ABNORMAL LOW (ref 145–400)
RBC: 4.02 10*6/uL — ABNORMAL LOW (ref 4.20–5.70)
RDW: 14.4 % (ref 11.1–15.7)
WBC: 1.9 10*3/uL — ABNORMAL LOW (ref 4.0–10.0)

## 2014-04-14 MED ORDER — SODIUM CHLORIDE 0.9 % IJ SOLN
10.0000 mL | INTRAMUSCULAR | Status: DC | PRN
  Administered 2014-04-14: 10 mL via INTRAVENOUS
  Filled 2014-04-14: qty 10

## 2014-04-14 MED ORDER — HYDROMORPHONE HCL 1 MG/ML IJ SOLN
INTRAMUSCULAR | Status: AC
  Filled 2014-04-14: qty 1

## 2014-04-14 MED ORDER — ONDANSETRON 8 MG/50ML IVPB (CHCC)
8.0000 mg | Freq: Once | INTRAVENOUS | Status: AC
  Administered 2014-04-14: 8 mg via INTRAVENOUS

## 2014-04-14 MED ORDER — SODIUM CHLORIDE 0.9 % IV SOLN
INTRAVENOUS | Status: DC
  Administered 2014-04-14: 15:00:00 via INTRAVENOUS

## 2014-04-14 MED ORDER — HEPARIN SOD (PORK) LOCK FLUSH 100 UNIT/ML IV SOLN
500.0000 [IU] | Freq: Once | INTRAVENOUS | Status: AC
  Administered 2014-04-14: 500 [IU] via INTRAVENOUS
  Filled 2014-04-14: qty 5

## 2014-04-14 MED ORDER — HYDROMORPHONE HCL PF 2 MG/ML IJ SOLN
1.0000 mg | Freq: Once | INTRAMUSCULAR | Status: AC
  Administered 2014-04-14: 1 mg via INTRAVENOUS
  Filled 2014-04-14: qty 0.5

## 2014-04-14 MED ORDER — FENTANYL 12 MCG/HR TD PT72: 25.0000 ug | MEDICATED_PATCH | TRANSDERMAL | Status: DC

## 2014-04-14 MED ORDER — FENTANYL 12 MCG/HR TD PT72: 12.5000 ug | MEDICATED_PATCH | TRANSDERMAL | Status: DC

## 2014-04-14 NOTE — Progress Notes (Signed)
Pt complaining of burning/sore sensation in "esophagus." Informed MD of new complication.

## 2014-04-14 NOTE — Patient Instructions (Signed)

## 2014-04-15 ENCOUNTER — Ambulatory Visit: Payer: Medicare HMO

## 2014-04-15 ENCOUNTER — Ambulatory Visit (HOSPITAL_BASED_OUTPATIENT_CLINIC_OR_DEPARTMENT_OTHER): Payer: Medicare HMO

## 2014-04-15 VITALS — BP 123/69 | HR 83 | Temp 97.7°F | Resp 16

## 2014-04-15 DIAGNOSIS — C3402 Malignant neoplasm of left main bronchus: Secondary | ICD-10-CM

## 2014-04-15 DIAGNOSIS — E860 Dehydration: Secondary | ICD-10-CM

## 2014-04-15 LAB — IRON AND TIBC CHCC
%SAT: 61 % — ABNORMAL HIGH (ref 20–55)
IRON: 94 ug/dL (ref 42–163)
TIBC: 154 ug/dL — ABNORMAL LOW (ref 202–409)
UIBC: 60 ug/dL — ABNORMAL LOW (ref 117–376)

## 2014-04-15 LAB — FERRITIN CHCC: Ferritin: 2975 ng/ml — ABNORMAL HIGH (ref 22–316)

## 2014-04-15 MED ORDER — SODIUM CHLORIDE 0.9 % IV SOLN
INTRAVENOUS | Status: DC
  Administered 2014-04-15: 10:00:00 via INTRAVENOUS

## 2014-04-15 MED ORDER — HEPARIN SOD (PORK) LOCK FLUSH 100 UNIT/ML IV SOLN
500.0000 [IU] | Freq: Once | INTRAVENOUS | Status: AC
  Administered 2014-04-15: 500 [IU] via INTRAVENOUS
  Filled 2014-04-15: qty 5

## 2014-04-15 MED ORDER — SODIUM CHLORIDE 0.9 % IJ SOLN
10.0000 mL | INTRAMUSCULAR | Status: DC | PRN
  Administered 2014-04-15: 10 mL via INTRAVENOUS
  Filled 2014-04-15: qty 10

## 2014-04-15 NOTE — Patient Instructions (Signed)
Dehydration, Adult Dehydration is when you lose more fluids from the body than you take in. Vital organs like the kidneys, brain, and heart cannot function without a proper amount of fluids and salt. Any loss of fluids from the body can cause dehydration.  CAUSES   Vomiting.  Diarrhea.  Excessive sweating.  Excessive urine output.  Fever. SYMPTOMS  Mild dehydration  Thirst.  Dry lips.  Slightly dry mouth. Moderate dehydration  Very dry mouth.  Sunken eyes.  Skin does not bounce back quickly when lightly pinched and released.  Dark urine and decreased urine production.  Decreased tear production.  Headache. Severe dehydration  Very dry mouth.  Extreme thirst.  Rapid, weak pulse (more than 100 beats per minute at rest).  Cold hands and feet.  Not able to sweat in spite of heat and temperature.  Rapid breathing.  Blue lips.  Confusion and lethargy.  Difficulty being awakened.  Minimal urine production.  No tears. DIAGNOSIS  Your caregiver will diagnose dehydration based on your symptoms and your exam. Blood and urine tests will help confirm the diagnosis. The diagnostic evaluation should also identify the cause of dehydration. TREATMENT  Treatment of mild or moderate dehydration can often be done at home by increasing the amount of fluids that you drink. It is best to drink small amounts of fluid more often. Drinking too much at one time can make vomiting worse. Refer to the home care instructions below. Severe dehydration needs to be treated at the hospital where you will probably be given intravenous (IV) fluids that contain water and electrolytes. HOME CARE INSTRUCTIONS   Ask your caregiver about specific rehydration instructions.  Drink enough fluids to keep your urine clear or pale yellow.  Drink small amounts frequently if you have nausea and vomiting.  Eat as you normally do.  Avoid:  Foods or drinks high in sugar.  Carbonated  drinks.  Juice.  Extremely hot or cold fluids.  Drinks with caffeine.  Fatty, greasy foods.  Alcohol.  Tobacco.  Overeating.  Gelatin desserts.  Wash your hands well to avoid spreading bacteria and viruses.  Only take over-the-counter or prescription medicines for pain, discomfort, or fever as directed by your caregiver.  Ask your caregiver if you should continue all prescribed and over-the-counter medicines.  Keep all follow-up appointments with your caregiver. SEEK MEDICAL CARE IF:  You have abdominal pain and it increases or stays in one area (localizes).  You have a rash, stiff neck, or severe headache.  You are irritable, sleepy, or difficult to awaken.  You are weak, dizzy, or extremely thirsty. SEEK IMMEDIATE MEDICAL CARE IF:   You are unable to keep fluids down or you get worse despite treatment.  You have frequent episodes of vomiting or diarrhea.  You have blood or green matter (bile) in your vomit.  You have blood in your stool or your stool looks black and tarry.  You have not urinated in 6 to 8 hours, or you have only urinated a small amount of very dark urine.  You have a fever.  You faint. MAKE SURE YOU:   Understand these instructions.  Will watch your condition.  Will get help right away if you are not doing well or get worse. Document Released: 07/01/2005 Document Revised: 09/23/2011 Document Reviewed: 02/18/2011 ExitCare Patient Information 2015 [employer]. This information is not intended to replace advice given to you by your health care provider. Make sure you discuss any questions you have with your health care   provider.  

## 2014-04-18 ENCOUNTER — Encounter: Payer: Medicare HMO | Admitting: Nutrition

## 2014-04-18 ENCOUNTER — Ambulatory Visit: Payer: Medicare HMO

## 2014-04-18 ENCOUNTER — Encounter: Payer: Self-pay | Admitting: Nutrition

## 2014-04-18 ENCOUNTER — Ambulatory Visit (HOSPITAL_BASED_OUTPATIENT_CLINIC_OR_DEPARTMENT_OTHER): Payer: Medicare HMO

## 2014-04-18 VITALS — BP 131/48 | HR 52 | Temp 97.7°F | Resp 16 | Wt 184.0 lb

## 2014-04-18 DIAGNOSIS — E860 Dehydration: Secondary | ICD-10-CM

## 2014-04-18 DIAGNOSIS — C3402 Malignant neoplasm of left main bronchus: Secondary | ICD-10-CM

## 2014-04-18 MED ORDER — HEPARIN SOD (PORK) LOCK FLUSH 100 UNIT/ML IV SOLN
500.0000 [IU] | Freq: Once | INTRAVENOUS | Status: AC
  Administered 2014-04-18: 500 [IU] via INTRAVENOUS
  Filled 2014-04-18: qty 5

## 2014-04-18 MED ORDER — SODIUM CHLORIDE 0.9 % IV SOLN
INTRAVENOUS | Status: DC
  Administered 2014-04-18: 13:00:00 via INTRAVENOUS

## 2014-04-18 MED ORDER — SODIUM CHLORIDE 0.9 % IJ SOLN
10.0000 mL | INTRAMUSCULAR | Status: DC | PRN
  Administered 2014-04-18: 10 mL via INTRAVENOUS
  Filled 2014-04-18: qty 10

## 2014-04-18 NOTE — Patient Instructions (Signed)
Dehydration, Adult Dehydration is when you lose more fluids from the body than you take in. Vital organs like the kidneys, brain, and heart cannot function without a proper amount of fluids and salt. Any loss of fluids from the body can cause dehydration.  CAUSES   Vomiting.  Diarrhea.  Excessive sweating.  Excessive urine output.  Fever. SYMPTOMS  Mild dehydration  Thirst.  Dry lips.  Slightly dry mouth. Moderate dehydration  Very dry mouth.  Sunken eyes.  Skin does not bounce back quickly when lightly pinched and released.  Dark urine and decreased urine production.  Decreased tear production.  Headache. Severe dehydration  Very dry mouth.  Extreme thirst.  Rapid, weak pulse (more than 100 beats per minute at rest).  Cold hands and feet.  Not able to sweat in spite of heat and temperature.  Rapid breathing.  Blue lips.  Confusion and lethargy.  Difficulty being awakened.  Minimal urine production.  No tears. DIAGNOSIS  Your caregiver will diagnose dehydration based on your symptoms and your exam. Blood and urine tests will help confirm the diagnosis. The diagnostic evaluation should also identify the cause of dehydration. TREATMENT  Treatment of mild or moderate dehydration can often be done at home by increasing the amount of fluids that you drink. It is best to drink small amounts of fluid more often. Drinking too much at one time can make vomiting worse. Refer to the home care instructions below. Severe dehydration needs to be treated at the hospital where you will probably be given intravenous (IV) fluids that contain water and electrolytes. HOME CARE INSTRUCTIONS   Ask your caregiver about specific rehydration instructions.  Drink enough fluids to keep your urine clear or pale yellow.  Drink small amounts frequently if you have nausea and vomiting.  Eat as you normally do.  Avoid:  Foods or drinks high in sugar.  Carbonated  drinks.  Juice.  Extremely hot or cold fluids.  Drinks with caffeine.  Fatty, greasy foods.  Alcohol.  Tobacco.  Overeating.  Gelatin desserts.  Wash your hands well to avoid spreading bacteria and viruses.  Only take over-the-counter or prescription medicines for pain, discomfort, or fever as directed by your caregiver.  Ask your caregiver if you should continue all prescribed and over-the-counter medicines.  Keep all follow-up appointments with your caregiver. SEEK MEDICAL CARE IF:  You have abdominal pain and it increases or stays in one area (localizes).  You have a rash, stiff neck, or severe headache.  You are irritable, sleepy, or difficult to awaken.  You are weak, dizzy, or extremely thirsty. SEEK IMMEDIATE MEDICAL CARE IF:   You are unable to keep fluids down or you get worse despite treatment.  You have frequent episodes of vomiting or diarrhea.  You have blood or green matter (bile) in your vomit.  You have blood in your stool or your stool looks black and tarry.  You have not urinated in 6 to 8 hours, or you have only urinated a small amount of very dark urine.  You have a fever.  You faint. MAKE SURE YOU:   Understand these instructions.  Will watch your condition.  Will get help right away if you are not doing well or get worse. Document Released: 07/01/2005 Document Revised: 09/23/2011 Document Reviewed: 02/18/2011 ExitCare Patient Information 2015 [employer]. This information is not intended to replace advice given to you by your health care provider. Make sure you discuss any questions you have with your health care   provider.  

## 2014-04-18 NOTE — Progress Notes (Signed)
Patient is too sick to come to nutrition appointment.  Patient cancelled and did not reschedule.

## 2014-04-19 ENCOUNTER — Encounter: Payer: Self-pay | Admitting: Radiation Oncology

## 2014-04-19 ENCOUNTER — Ambulatory Visit: Payer: Medicare HMO

## 2014-04-19 LAB — AFB CULTURE WITH SMEAR (NOT AT ARMC)
ACID FAST SMEAR: NONE SEEN
SPECIAL REQUESTS: NORMAL

## 2014-04-20 ENCOUNTER — Ambulatory Visit: Payer: Medicare HMO

## 2014-04-20 ENCOUNTER — Telehealth: Payer: Self-pay | Admitting: *Deleted

## 2014-04-20 NOTE — Telephone Encounter (Signed)
Verified with pt his appointment schedule. Pt stated he was having pain with eating. Reviewed meds and educated pt regarding using his prn Morphine more frequently than qam - order is for q2h prn.

## 2014-04-21 ENCOUNTER — Ambulatory Visit: Payer: Medicare HMO

## 2014-04-21 ENCOUNTER — Other Ambulatory Visit: Payer: Medicare HMO | Admitting: Lab

## 2014-04-22 ENCOUNTER — Ambulatory Visit: Payer: Medicare HMO

## 2014-04-25 ENCOUNTER — Ambulatory Visit: Payer: Medicare HMO

## 2014-04-25 ENCOUNTER — Ambulatory Visit
Admission: RE | Admit: 2014-04-25 | Discharge: 2014-04-25 | Disposition: A | Discharge status: Home / Self Care | Payer: Medicare HMO | Source: Ambulatory Visit | Attending: Radiation Oncology | Admitting: Radiation Oncology

## 2014-04-25 ENCOUNTER — Encounter: Payer: Self-pay | Admitting: Radiation Oncology

## 2014-04-25 ENCOUNTER — Inpatient Hospital Stay
Admission: RE | Admit: 2014-04-25 | Discharge: 2014-04-25 | Disposition: A | Discharge status: Home / Self Care | Payer: Self-pay | Source: Ambulatory Visit | Attending: Radiation Oncology | Admitting: Radiation Oncology

## 2014-04-25 VITALS — BP 113/68 | HR 89 | Temp 97.7°F | Ht 68.0 in | Wt 185.8 lb

## 2014-04-25 DIAGNOSIS — C3402 Malignant neoplasm of left main bronchus: Secondary | ICD-10-CM

## 2014-04-25 DIAGNOSIS — Z510 Encounter for antineoplastic radiation therapy: Secondary | ICD-10-CM | POA: Diagnosis not present

## 2014-04-25 NOTE — Progress Notes (Signed)
Clinic note: The patient is seen today after becoming lightheaded and dizzy getting off the treatment table. Orthostatic vital signs are satisfactory. He denies any respiratory difficulties. He drove himself for his treatment today. He is on a fentanyl 12 mcg patch and  he did take 1 Percocet this morning. He was a one-week rest because of esophagitis. His weight is stable. He has a history of diabetes mellitus and he didn't check his blood sugar this morning and it was 137. He had a CT scan of his brain on September 3 which was without evidence for metastatic disease.   Physical examination: Alert and oriented. Filed Vitals:   04/25/14 1144  BP: 113/68  Pulse: 89  Temp: 97.7 F (36.5 C)   Neurologic examination is nonfocal. Lungs are clear.  Impression: He appears to have become temporarily orthostatic after getting off his treatment table this morning. His wife will drive him home. No localizing neurologic signs or symptoms. His blood sugar this morning was satisfactory.  Plan: Weekly management visit with Dr. Sondra Come tomorrow. Continue with radiation therapy as planned.

## 2014-04-25 NOTE — Progress Notes (Addendum)
Patient had first radiation treatment since being on break for a week.  Reports feeling dizzy and nauseaous after getting up from the table.  Orthostatic vitals done bp sitting 113/68, hr 89, bp standing 108/61, hr 99.  He denies shortness of breath and cough.  He said the burning in his abdomen with eating is gone.  He is using a fentanyl pattch 12 mcg, percocet 1 tablet in the am and carafate.  He reports eating better for the past few days and has gained 1 lb since 10/5.  He had chemotherapy on Thursday.  Patient is weak when standing.  He drove himself today.

## 2014-04-26 ENCOUNTER — Ambulatory Visit
Admission: RE | Admit: 2014-04-26 | Discharge: 2014-04-26 | Disposition: A | Discharge status: Home / Self Care | Payer: Medicare HMO | Source: Ambulatory Visit | Attending: Radiation Oncology | Admitting: Radiation Oncology

## 2014-04-26 ENCOUNTER — Encounter: Payer: Self-pay | Admitting: Radiation Oncology

## 2014-04-26 ENCOUNTER — Ambulatory Visit: Payer: Medicare HMO

## 2014-04-26 VITALS — BP 113/70 | HR 82 | Temp 98.5°F | Ht 68.0 in | Wt 185.0 lb

## 2014-04-26 DIAGNOSIS — C3402 Malignant neoplasm of left main bronchus: Secondary | ICD-10-CM

## 2014-04-26 DIAGNOSIS — Z510 Encounter for antineoplastic radiation therapy: Secondary | ICD-10-CM | POA: Diagnosis not present

## 2014-04-26 NOTE — Progress Notes (Signed)
  Radiation Oncology         [phone] ________________________________  Name: Gregory Farrell MRN: [id number]  Date: 04/26/2014  DOB: [birth year]  Weekly Radiation Therapy Management  Lung cancer, hilus   Primary site: Lung   Staging method: AJCC 7th Edition   Clinical: Stage IIIA (T3, N2, M0)    Summary: Stage IIIA (T3, N2, M0)    Current Dose: 32.4 Gy     Planned Dose:  63 Gy  Narrative . . . . . . . . The patient presents for routine under treatment assessment.                                   The patient is without complaint. He is feeling much better after his break in treatment. Yesterday the patient had some dizziness with getting up off the treatment table but none since that one episode. The patient is forcing fluids.                                 Set-up films were reviewed.                                 The chart was checked. Physical Findings. . .  height is 5\' 8"  (1.727 m) and weight is 185 lb (83.915 kg). His temperature is 98.5 F (36.9 C). His blood pressure is 113/70 and his pulse is 82. His oxygen saturation is 98%. . The lungs are clear. No wheezing appreciated. The heart has a regular rhythm and rate.  Impression . . . . . . . The patient is tolerating radiation. Plan . . . . . . . . . . . . Continue treatment as planned.  ________________________________   Blair Promise, PhD, MD

## 2014-04-26 NOTE — Progress Notes (Signed)
Mr. Demetro has completed 18 fractions to his left lung.  He denies any pain, but does admit to fatigue.  Travel by wheelchair.  VSS. O2 sat 98% on RA.  SOB when ambulatiing

## 2014-04-27 ENCOUNTER — Ambulatory Visit: Payer: Medicare HMO

## 2014-04-27 ENCOUNTER — Emergency Department (HOSPITAL_BASED_OUTPATIENT_CLINIC_OR_DEPARTMENT_OTHER)
Admission: EM | Admit: 2014-04-27 | Discharge: 2014-04-27 | Disposition: A | Discharge status: Home / Self Care | Payer: Medicare HMO | Attending: Emergency Medicine | Admitting: Emergency Medicine

## 2014-04-27 ENCOUNTER — Encounter (HOSPITAL_BASED_OUTPATIENT_CLINIC_OR_DEPARTMENT_OTHER): Payer: Self-pay | Admitting: Emergency Medicine

## 2014-04-27 DIAGNOSIS — K5900 Constipation, unspecified: Secondary | ICD-10-CM | POA: Diagnosis present

## 2014-04-27 DIAGNOSIS — E785 Hyperlipidemia, unspecified: Secondary | ICD-10-CM | POA: Diagnosis not present

## 2014-04-27 DIAGNOSIS — E663 Overweight: Secondary | ICD-10-CM | POA: Insufficient documentation

## 2014-04-27 DIAGNOSIS — K5641 Fecal impaction: Secondary | ICD-10-CM | POA: Diagnosis not present

## 2014-04-27 DIAGNOSIS — I10 Essential (primary) hypertension: Secondary | ICD-10-CM | POA: Insufficient documentation

## 2014-04-27 DIAGNOSIS — Z79899 Other long term (current) drug therapy: Secondary | ICD-10-CM | POA: Diagnosis not present

## 2014-04-27 DIAGNOSIS — J449 Chronic obstructive pulmonary disease, unspecified: Secondary | ICD-10-CM | POA: Diagnosis not present

## 2014-04-27 DIAGNOSIS — Z87448 Personal history of other diseases of urinary system: Secondary | ICD-10-CM | POA: Insufficient documentation

## 2014-04-27 DIAGNOSIS — Z794 Long term (current) use of insulin: Secondary | ICD-10-CM | POA: Diagnosis not present

## 2014-04-27 DIAGNOSIS — Z7952 Long term (current) use of systemic steroids: Secondary | ICD-10-CM | POA: Insufficient documentation

## 2014-04-27 DIAGNOSIS — Z7982 Long term (current) use of aspirin: Secondary | ICD-10-CM | POA: Diagnosis not present

## 2014-04-27 DIAGNOSIS — K219 Gastro-esophageal reflux disease without esophagitis: Secondary | ICD-10-CM | POA: Insufficient documentation

## 2014-04-27 DIAGNOSIS — Z85118 Personal history of other malignant neoplasm of bronchus and lung: Secondary | ICD-10-CM | POA: Insufficient documentation

## 2014-04-27 DIAGNOSIS — Z9861 Coronary angioplasty status: Secondary | ICD-10-CM | POA: Diagnosis not present

## 2014-04-27 DIAGNOSIS — E1142 Type 2 diabetes mellitus with diabetic polyneuropathy: Secondary | ICD-10-CM | POA: Diagnosis not present

## 2014-04-27 DIAGNOSIS — I2510 Atherosclerotic heart disease of native coronary artery without angina pectoris: Secondary | ICD-10-CM | POA: Diagnosis not present

## 2014-04-27 MED ORDER — LIDOCAINE HCL 2 % EX GEL
CUTANEOUS | Status: AC
  Filled 2014-04-27: qty 20

## 2014-04-27 MED ORDER — LIDOCAINE HCL 2 % EX GEL
1.0000 "application " | Freq: Once | CUTANEOUS | Status: AC
  Administered 2014-04-27: 20 via TOPICAL

## 2014-04-27 NOTE — Discharge Instructions (Signed)
Fecal Impaction A fecal impaction happens when there is a large, firm amount of stool (or feces) that cannot be passed. The impacted stool is usually in the rectum, which is the lowest part of the large bowel. The impacted stool can block the colon and cause significant problems. CAUSES  The longer stool stays in the rectum, the harder it gets. Anything that slows down your bowel movements can lead to fecal impaction, such as:  Constipation. This can be a long-standing (chronic) problem or can happen suddenly (acute).  Painful conditions of the rectum, such as hemorrhoids or anal fissures. The pain of these conditions can make you try to avoid having bowel movements.  Narcotic pain-relieving medicines, such as methadone, morphine, or codeine.  Not drinking enough fluids.  Inactivity and bed rest over long periods of time.  Diseases of the brain or nervous system that damage the nerves controlling the muscles of the intestines. SIGNS AND SYMPTOMS   Lack of normal bowel movements or changes in bowel patterns.  Sense of fullness in the rectum but unable to pass stool.  Pain or cramps in the abdominal area (often after meals).  Thin, watery discharge from the rectum. DIAGNOSIS  Your health care provider may suspect that you have a fecal impaction based on your symptoms and a physical exam. This will include an exam of your rectum. Sometimes X-rays or lab testing may be needed to confirm the diagnosis and to be sure there are no other problems.  TREATMENT   Initially an impaction can be removed manually. Using a gloved finger, your health care provider can remove hard stool from your rectum.  Medicine is sometimes needed. A suppository or enema can be given in the rectum to soften the stool, which can stimulate a bowel movement. Medicines can also be given by mouth (orally).  Though rare, surgery may be needed if the colon has torn (perforated) due to blockage. HOME CARE INSTRUCTIONS    Develop regular bowel habits. This could include getting in the habit of having a bowel movement after your morning cup of coffee or after eating. Be sure to allow yourself enough time on the toilet.  Maintain a high-fiber diet.  Drink enough fluids to keep your urine clear or pale yellow as directed by your health care provider.  Exercise regularly.  If you begin to get constipated, increase the amount of fiber in your diet. Eat plenty of fruits, vegetables, whole wheat breads, bran, oatmeal, and similar products.  Take natural fiber laxatives or other laxatives only as directed by your health care provider. SEEK IMMEDIATE MEDICAL CARE IF:  You have black or tarry stools. MAKE SURE YOU:   Understand these instructions.  Will watch your condition.  Will get help right away if you are not doing well or get worse. Document Released: 03/23/2004 Document Revised: 04/21/2013 Document Reviewed: 01/05/2013 [facility] Patient Information 2015 [place], [place]. This information is not intended to replace advice given to you by your health care provider. Make sure you discuss any questions you have with your health care provider.

## 2014-04-27 NOTE — ED Provider Notes (Signed)
CSN: [id number]     Arrival date & time 04/27/14  0059 History   First MD Initiated Contact with Patient 04/27/14 0110     Chief Complaint  Patient presents with  . Constipation     (Consider location/radiation/quality/duration/timing/severity/associated sxs/prior Treatment) HPI This is a 80 year old male with lung cancer who is currently undergoing radiation and chemotherapy. He has not had a bowel movement in 4 days. He states he has a hard ball of stool in his rectum that he cannot pass. He has had pain in his rectum since yesterday. Pain is moderate to severe, worse with movement or attempted defecation. He denies abdominal pain apart from esophageal burning secondary to radiation therapy. He has had no nausea or vomiting associated with constipation.  Past Medical History  Diagnosis Date  . Hypertension   . COPD (chronic obstructive pulmonary disease)   . GERD (gastroesophageal reflux disease)   . Renal insufficiency   . Hyperlipidemia   . CAD (coronary artery disease)   . B12 deficiency   . Memory loss   . Vertigo   . Diabetes mellitus     type II, poly neuropathy  . Overweight 01/31/2013  . Lung cancer, hilus 03/22/2014   Past Surgical History  Procedure Laterality Date  . Coronary angioplasty with stent placement    . Inguinal hernia repair  2010  . Video bronchoscopy Bilateral 03/07/2014    Procedure: VIDEO BRONCHOSCOPY WITHOUT FLUORO;  Surgeon: Kathee Delton, MD;  Location: [facility];  Service: Cardiopulmonary;  Laterality: Bilateral;   Family History  Problem Relation Age of Onset  . Coronary artery disease Mother   . Breast cancer Mother   . Diabetes Mother   . Leukemia Brother    History  Substance Use Topics  . Smoking status: Former Smoker -- 1.00 packs/day for 20 years    Types: Cigarettes    Start date: 09/09/1982    Quit date: 07/15/2002  . Smokeless tobacco: Never Used     Comment: quit smoking 11 years ago  . Alcohol Use: No    Review of  Systems  All other systems reviewed and are negative.   Allergies  Exenatide  Home Medications   Prior to Admission medications   Medication Sig Start Date End Date Taking? Authorizing Provider  albuterol (PROVENTIL HFA;VENTOLIN HFA) 108 (90 BASE) MCG/ACT inhaler Inhale 2 puffs into the lungs every 6 (six) hours as needed for wheezing or shortness of breath.    Historical Provider, MD  aspirin 81 MG tablet Take 81 mg by mouth daily.      Historical Provider, MD  atorvastatin (LIPITOR) 80 MG tablet TAKE ONE TABLET BY MOUTH ONE TIME DAILY  04/08/14   Mosie Lukes, MD  budesonide-formoterol [facility]) 160-4.5 MCG/ACT inhaler Inhale 2 puffs into the lungs 2 (two) times daily.    Historical Provider, MD  citalopram (CELEXA) 20 MG tablet TAKE TWO TABLETS BY MOUTH DAILY  04/01/14   Debbrah Alar, NP  fentaNYL (DURAGESIC - DOSED MCG/HR) 12 MCG/HR Place 1 patch (12.5 mcg total) onto the skin every 3 (three) days. 04/14/14   Volanda Napoleon, MD  insulin detemir (LEVEMIR) 100 UNIT/ML injection Inject 60 Units into the skin daily. May increase by 2 units every 3 days as directed    Historical Provider, MD  loperamide (IMODIUM) 1 MG/5ML solution Take 10 mLs (2 mg total) by mouth as needed for diarrhea or loose stools. 04/11/14   Volanda Napoleon, MD  LORazepam (ATIVAN) 0.5 MG tablet  Take 0.5 mg by mouth every 6 (six) hours as needed for anxiety.    Historical Provider, MD  mometasone-formoterol (DULERA) 200-5 MCG/ACT AERO Inhale 2 puffs into the lungs 2 (two) times daily.    Historical Provider, MD  morphine (ROXANOL) 20 MG/ML concentrated solution Take 0.84ml-0.5ml every 2hrs as needed for pain 04/11/14   Volanda Napoleon, MD  ondansetron (ZOFRAN) 8 MG tablet Take 8 mg by mouth every 8 (eight) hours as needed for nausea or vomiting. Start after Chemo    Historical Provider, MD  oxyCODONE-acetaminophen (PERCOCET/ROXICET) 5-325 MG per tablet  03/17/14   Historical Provider, MD  pantoprazole (PROTONIX) 40 MG  tablet Take 40 mg by mouth daily.    Historical Provider, MD  prochlorperazine (COMPAZINE) 10 MG tablet Take 10 mg by mouth every 6 (six) hours as needed for nausea or vomiting.    Historical Provider, MD  sucralfate (CARAFATE) 1 GM/10ML suspension Take 10 mLs (1 g total) by mouth 4 (four) times daily -  with meals and at bedtime. 04/05/14   Blair Promise, MD  Wound Cleansers (RADIAPLEX EX) Apply topically.    Historical Provider, MD   BP 124/79  Pulse 106  Temp(Src) 97.8 F (36.6 C) (Oral)  Resp 20  Ht 5\' 8"  (1.727 m)  Wt 183 lb (83.008 kg)  BMI 27.83 kg/m2  SpO2 94%  Physical Exam General: Well-developed, well-nourished male in no acute distress; appearance consistent with age of record HENT: normocephalic; atraumatic Eyes: pupils equal, round and reactive to light; extraocular muscles intact Neck: supple Heart: regular rate and rhythm Lungs: Mildly coarse breath sounds bilaterally Abdomen: soft; nondistended; nontender; no masses or hepatosplenomegaly; bowel sounds present Rectal: Fecal impaction Extremities: No deformity; full range of motion; pulses normal Neurologic: Awake, alert and oriented; motor function intact in all extremities and symmetric; no facial droop Skin: Warm and dry; hair shedding Psychiatric: Flat affect    ED Course  Procedures (including critical care time)  DISIMPACTION 2% lidocaine gel injected in the rectal vault and for lubrication and pain relief. Hard stool removed manually from rectum by myself. Patient subsequently passed a large volume of stool following a soap suds enema.  MDM  Patient advised to take MiraLax daily as he is on chronic narcotic medications for his cancer.   Wynetta Fines, MD 04/27/14 [phone]

## 2014-04-27 NOTE — ED Notes (Signed)
Pt reports LBM 4-5 days ago.  Reports pain that increased around 8pm tonight.  Denies abdominal pain-reports rectal pain.

## 2014-04-28 ENCOUNTER — Ambulatory Visit
Admission: RE | Admit: 2014-04-28 | Discharge: 2014-04-28 | Disposition: A | Discharge status: Home / Self Care | Payer: Medicare HMO | Source: Ambulatory Visit | Attending: Radiation Oncology | Admitting: Radiation Oncology

## 2014-04-28 ENCOUNTER — Other Ambulatory Visit (HOSPITAL_BASED_OUTPATIENT_CLINIC_OR_DEPARTMENT_OTHER): Payer: Medicare HMO | Admitting: Lab

## 2014-04-28 ENCOUNTER — Ambulatory Visit (HOSPITAL_BASED_OUTPATIENT_CLINIC_OR_DEPARTMENT_OTHER): Payer: Medicare HMO | Admitting: Hematology & Oncology

## 2014-04-28 ENCOUNTER — Ambulatory Visit: Payer: Medicare HMO

## 2014-04-28 ENCOUNTER — Ambulatory Visit (HOSPITAL_BASED_OUTPATIENT_CLINIC_OR_DEPARTMENT_OTHER): Payer: Medicare HMO

## 2014-04-28 ENCOUNTER — Encounter: Payer: Self-pay | Admitting: Hematology & Oncology

## 2014-04-28 VITALS — BP 106/58 | HR 90 | Temp 97.8°F | Resp 20 | Ht 69.0 in

## 2014-04-28 VITALS — BP 166/77 | HR 53 | Resp 20

## 2014-04-28 DIAGNOSIS — R918 Other nonspecific abnormal finding of lung field: Secondary | ICD-10-CM

## 2014-04-28 DIAGNOSIS — Z510 Encounter for antineoplastic radiation therapy: Secondary | ICD-10-CM | POA: Diagnosis not present

## 2014-04-28 DIAGNOSIS — C3402 Malignant neoplasm of left main bronchus: Secondary | ICD-10-CM

## 2014-04-28 DIAGNOSIS — Z5111 Encounter for antineoplastic chemotherapy: Secondary | ICD-10-CM

## 2014-04-28 LAB — CBC WITH DIFFERENTIAL (CANCER CENTER ONLY)
BASO#: 0 10*3/uL (ref 0.0–0.2)
BASO%: 0.5 % (ref 0.0–2.0)
EOS%: 0.2 % (ref 0.0–7.0)
Eosinophils Absolute: 0 10*3/uL (ref 0.0–0.5)
HCT: 38.1 % — ABNORMAL LOW (ref 38.7–49.9)
HGB: 12.1 g/dL — ABNORMAL LOW (ref 13.0–17.1)
LYMPH#: 0.6 10*3/uL — AB (ref 0.9–3.3)
LYMPH%: 12.4 % — ABNORMAL LOW (ref 14.0–48.0)
MCH: 30.9 pg (ref 28.0–33.4)
MCHC: 31.8 g/dL — ABNORMAL LOW (ref 32.0–35.9)
MCV: 97 fL (ref 82–98)
MONO#: 0.7 10*3/uL (ref 0.1–0.9)
MONO%: 14.7 % — ABNORMAL HIGH (ref 0.0–13.0)
NEUT#: 3.2 10*3/uL (ref 1.5–6.5)
NEUT%: 72.2 % (ref 40.0–80.0)
Platelets: 139 10*3/uL — ABNORMAL LOW (ref 145–400)
RBC: 3.91 10*6/uL — ABNORMAL LOW (ref 4.20–5.70)
RDW: 19.7 % — AB (ref 11.1–15.7)
WBC: 4.4 10*3/uL (ref 4.0–10.0)

## 2014-04-28 LAB — PREALBUMIN: Prealbumin: 13.2 mg/dL — ABNORMAL LOW (ref 17.0–34.0)

## 2014-04-28 LAB — CMP (CANCER CENTER ONLY)
ALBUMIN: 2.7 g/dL — AB (ref 3.3–5.5)
ALT(SGPT): 9 U/L — ABNORMAL LOW (ref 10–47)
AST: 17 U/L (ref 11–38)
Alkaline Phosphatase: 103 U/L — ABNORMAL HIGH (ref 26–84)
BILIRUBIN TOTAL: 0.9 mg/dL (ref 0.20–1.60)
BUN: 12 mg/dL (ref 7–22)
CO2: 27 mEq/L (ref 18–33)
Calcium: 8.4 mg/dL (ref 8.0–10.3)
Chloride: 97 mEq/L — ABNORMAL LOW (ref 98–108)
Creat: 1.5 mg/dl — ABNORMAL HIGH (ref 0.6–1.2)
GLUCOSE: 164 mg/dL — AB (ref 73–118)
POTASSIUM: 3.8 meq/L (ref 3.3–4.7)
Sodium: 138 mEq/L (ref 128–145)
Total Protein: 6.1 g/dL — ABNORMAL LOW (ref 6.4–8.1)

## 2014-04-28 LAB — TECHNOLOGIST REVIEW CHCC SATELLITE

## 2014-04-28 MED ORDER — DIPHENHYDRAMINE HCL 25 MG PO CAPS
ORAL_CAPSULE | ORAL | Status: AC
  Filled 2014-04-28: qty 1

## 2014-04-28 MED ORDER — SODIUM CHLORIDE 0.9 % IJ SOLN
10.0000 mL | INTRAMUSCULAR | Status: DC | PRN
  Administered 2014-04-28: 10 mL
  Filled 2014-04-28: qty 10

## 2014-04-28 MED ORDER — DEXAMETHASONE SODIUM PHOSPHATE 10 MG/ML IJ SOLN
INTRAMUSCULAR | Status: AC
  Filled 2014-04-28: qty 2

## 2014-04-28 MED ORDER — FAMOTIDINE IN NACL 20-0.9 MG/50ML-% IV SOLN
20.0000 mg | Freq: Once | INTRAVENOUS | Status: AC
  Administered 2014-04-28: 20 mg via INTRAVENOUS

## 2014-04-28 MED ORDER — FAMOTIDINE IN NACL 20-0.9 MG/50ML-% IV SOLN
INTRAVENOUS | Status: AC
  Filled 2014-04-28: qty 50

## 2014-04-28 MED ORDER — FENTANYL 12 MCG/HR TD PT72: 12.5000 ug | MEDICATED_PATCH | TRANSDERMAL | Status: DC

## 2014-04-28 MED ORDER — SODIUM CHLORIDE 0.9 % IV SOLN
120.0000 mg | Freq: Once | INTRAVENOUS | Status: AC
  Administered 2014-04-28: 120 mg via INTRAVENOUS
  Filled 2014-04-28: qty 12

## 2014-04-28 MED ORDER — DEXAMETHASONE SODIUM PHOSPHATE 20 MG/5ML IJ SOLN
20.0000 mg | Freq: Once | INTRAMUSCULAR | Status: AC
  Administered 2014-04-28: 20 mg via INTRAVENOUS

## 2014-04-28 MED ORDER — HEPARIN SOD (PORK) LOCK FLUSH 100 UNIT/ML IV SOLN
500.0000 [IU] | Freq: Once | INTRAVENOUS | Status: AC | PRN
  Administered 2014-04-28: 500 [IU]
  Filled 2014-04-28: qty 5

## 2014-04-28 MED ORDER — ONDANSETRON 16 MG/50ML IVPB (CHCC)
INTRAVENOUS | Status: AC
Start: 2014-04-28 — End: 2014-04-28
  Filled 2014-04-28: qty 16

## 2014-04-28 MED ORDER — ONDANSETRON 16 MG/50ML IVPB (CHCC)
16.0000 mg | Freq: Once | INTRAVENOUS | Status: AC
  Administered 2014-04-28: 16 mg via INTRAVENOUS

## 2014-04-28 MED ORDER — SODIUM CHLORIDE 0.9 % IV SOLN
Freq: Once | INTRAVENOUS | Status: AC
  Administered 2014-04-28: 15:00:00 via INTRAVENOUS

## 2014-04-28 MED ORDER — PACLITAXEL CHEMO INJECTION 300 MG/50ML
36.0000 mg/m2 | Freq: Once | INTRAVENOUS | Status: AC
  Administered 2014-04-28: 72 mg via INTRAVENOUS
  Filled 2014-04-28: qty 12

## 2014-04-28 MED ORDER — DIPHENHYDRAMINE HCL 25 MG PO CAPS
25.0000 mg | ORAL_CAPSULE | Freq: Once | ORAL | Status: AC
  Administered 2014-04-28: 25 mg via ORAL

## 2014-04-28 MED ORDER — LACTULOSE 10 GM/15ML PO SOLN: 20.0000 g | Freq: Two times a day (BID) | ORAL | Status: DC

## 2014-04-28 NOTE — Progress Notes (Signed)
Hematology and Oncology Follow Up Visit  Gregory Farrell [id number] 1932/07/26 80 y.o. 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  Farrell is in for followup. Unfortunately, he was in the emergency room yesterday. Severe constipation.  He does not drink a lot. As much as we tell him to drink, he does not.  He's been off radiation for a week. He restarted this week. He's been off chemotherapy for a week. This definitely made him feel better. He's not having odynophagia. He is following. However, he just is not eating. He will not take medicine to help with his appetite.  We will put her on lactulose to try to help prevent constipation.  He'll finish up his radiation in about 15 days.  His breathing is doing okay. He is weak. His blood sugars have been all right.  Currently, his performance status is ECOG 2  Medications: Current outpatient prescriptions:albuterol (PROVENTIL HFA;VENTOLIN HFA) 108 (90 BASE) MCG/ACT inhaler, Inhale 2 puffs into the lungs every 6 (six) hours as needed for wheezing or shortness of breath., Disp: , Rfl: ;  aspirin 81 MG tablet, Take 81 mg by mouth daily.  , Disp: , Rfl: ;  atorvastatin (LIPITOR) 80 MG tablet, TAKE ONE TABLET BY MOUTH ONE TIME DAILY , Disp: 90 tablet, Rfl: 1 budesonide-formoterol (SYMBICORT) 160-4.5 MCG/ACT inhaler, Inhale 2 puffs into the lungs 2 (two) times daily., Disp: , Rfl: ;  citalopram (CELEXA) 20 MG tablet, TAKE TWO TABLETS BY MOUTH DAILY , Disp: 60 tablet, Rfl: 3;  fentaNYL (DURAGESIC - DOSED MCG/HR) 12 MCG/HR, Place 1 patch (12.5 mcg total) onto the skin every 3 (three) days., Disp: 10 patch, Rfl: 0 insulin detemir (LEVEMIR) 100 UNIT/ML injection, Inject 60 Units into the skin daily. May increase by 2 units every 3 days as directed, Disp: , Rfl: ;  loperamide (IMODIUM) 1  MG/5ML solution, Take 10 mLs (2 mg total) by mouth as needed for diarrhea or loose stools., Disp: 120 mL, Rfl: 2;  LORazepam (ATIVAN) 0.5 MG tablet, Take 0.5 mg by mouth every 6 (six) hours as needed for anxiety., Disp: , Rfl:  mometasone-formoterol (DULERA) 200-5 MCG/ACT AERO, Inhale 2 puffs into the lungs 2 (two) times daily., Disp: , Rfl: ;  morphine (ROXANOL) 20 MG/ML concentrated solution, Take 0.69ml-0.5ml every 2hrs as needed for pain, Disp: 120 mL, Rfl: 0;  ondansetron (ZOFRAN) 8 MG tablet, Take 8 mg by mouth every 8 (eight) hours as needed for nausea or vomiting. Start after Chemo, Disp: , Rfl:  pantoprazole (PROTONIX) 40 MG tablet, Take 40 mg by mouth daily., Disp: , Rfl: ;  prochlorperazine (COMPAZINE) 10 MG tablet, Take 10 mg by mouth every 6 (six) hours as needed for nausea or vomiting., Disp: , Rfl: ;  sucralfate (CARAFATE) 1 GM/10ML suspension, Take 10 mLs (1 g total) by mouth 4 (four) times daily -  with meals and at bedtime., Disp: 420 mL, Rfl: 0;  Wound Cleansers (RADIAPLEX EX), Apply topically., Disp: , Rfl:  lactulose (CHRONULAC) 10 GM/15ML solution, Take 30 mLs (20 g total) by mouth 2 (two) times daily., Disp: 500 mL, Rfl: 0 No current facility-administered medications for this visit. Facility-Administered Medications Ordered in Other Visits: sodium chloride 0.9 % injection 10 mL, 10 mL, Intracatheter, PRN, Volanda Napoleon, MD, 10 mL at 04/28/14 1735  Allergies:  Allergies  Allergen Reactions  . Exenatide     REACTION: nausea    Past Medical History, Surgical history, Social history, and Family History were reviewed and updated.  Review of Systems: As above  Physical Exam:  height is 5\' 9"  (1.753 m). His oral temperature is 97.8 F (36.6 C). His blood pressure is 106/58 and his pulse is 90. His respiration is 20.   Elderly white gentleman. Head and exam shows no adenopathy in the neck. His oral mucosa is moist. There is no thrush. Lungs are clear. Has good breath sounds  bilaterally. Cardiac exam is regular in rhythm with no murmurs, rubs or bruits. Abdomen is soft. She has good bowel sounds. There is no fluid wave. There is no palpable liver or spleen tip. Extremities shows some trace edema in his legs. Neurological exam is nonfocal. Skin exam shows no rashes, ecchymosis or petechia.  Lab Results  Component Value Date   WBC 4.4 04/28/2014   HGB 12.1* 04/28/2014   HCT 38.1* 04/28/2014   MCV 97 04/28/2014   PLT 139* 04/28/2014     Chemistry      Component Value Date/Time   NA 138 04/28/2014 1316   NA 136 04/12/2014 1317   NA 135* 03/30/2014 1305   K 3.8 04/28/2014 1316   K 4.6 04/12/2014 1317   K 4.4 03/30/2014 1305   CL 97* 04/28/2014 1316   CL 100 03/30/2014 1305   CO2 27 04/28/2014 1316   CO2 26 04/12/2014 1317   CO2 20 03/30/2014 1305   BUN 12 04/28/2014 1316   BUN 22.3 04/12/2014 1317   BUN 39* 03/30/2014 1305   CREATININE 1.5* 04/28/2014 1316   CREATININE 1.5* 04/12/2014 1317   CREATININE 1.93* 03/30/2014 1305      Component Value Date/Time   CALCIUM 8.4 04/28/2014 1316   CALCIUM 9.2 04/12/2014 1317   CALCIUM 9.4 03/30/2014 1305   ALKPHOS 103* 04/28/2014 1316   ALKPHOS 105 03/22/2014 1109   AST 17 04/28/2014 1316   AST 17 03/22/2014 1109   ALT 9* 04/28/2014 1316   ALT 12 03/22/2014 1109   BILITOT 0.90 04/28/2014 1316   BILITOT 0.9 03/22/2014 1109         Impression and Plan: Gregory Farrell is 80 year old gentleman with locally advanced non-small cell lung cancer. He is not a surgical candidate. We tried for curative radiation and chemotherapy.  Again, having himtake a break for one week was very good for him.  Again, we talked to him about drinking enough. He says that he will do this.  We will continue weekly treatment on him.  I spent about 30 minutes with him today. His daughter, who is incredibly helpful, was there to let us really know what's going on.   Volanda Napoleon, MD 10/15/20156:26 PM

## 2014-04-28 NOTE — Progress Notes (Signed)
MD aware that CR 1.5 and he verbalized that pt is okay to receive treatment today.

## 2014-04-28 NOTE — Patient Instructions (Signed)
Paclitaxel injection What is this medicine? PACLITAXEL (PAK li TAX el) is a chemotherapy drug. It targets fast dividing cells, like cancer cells, and causes these cells to die. This medicine is used to treat ovarian cancer, breast cancer, and other cancers. This medicine may be used for other purposes; ask your health care provider or pharmacist if you have questions. COMMON BRAND NAME(S): Onxol, Taxol What should I tell my health care provider before I take this medicine? They need to know if you have any of these conditions: -blood disorders -irregular heartbeat -infection (especially a virus infection such as chickenpox, cold sores, or herpes) -liver disease -previous or ongoing radiation therapy -an unusual or allergic reaction to paclitaxel, alcohol, polyoxyethylated castor oil, other chemotherapy agents, other medicines, foods, dyes, or preservatives -pregnant or trying to get pregnant -breast-feeding How should I use this medicine? This drug is given as an infusion into a vein. It is administered in a hospital or clinic by a specially trained health care professional. Talk to your pediatrician regarding the use of this medicine in children. Special care may be needed. Overdosage: If you think you have taken too much of this medicine contact a poison control center or emergency room at once. NOTE: This medicine is only for you. Do not share this medicine with others. What if I miss a dose? It is important not to miss your dose. Call your doctor or health care professional if you are unable to keep an appointment. What may interact with this medicine? Do not take this medicine with any of the following medications: -disulfiram -metronidazole This medicine may also interact with the following medications: -cyclosporine -diazepam -ketoconazole -medicines to increase blood counts like filgrastim, pegfilgrastim, sargramostim -other chemotherapy drugs like cisplatin, doxorubicin,  epirubicin, etoposide, teniposide, vincristine -quinidine -testosterone -vaccines -verapamil Talk to your doctor or health care professional before taking any of these medicines: -acetaminophen -aspirin -ibuprofen -ketoprofen -naproxen This list may not describe all possible interactions. Give your health care provider a list of all the medicines, herbs, non-prescription drugs, or dietary supplements you use. Also tell them if you smoke, drink alcohol, or use illegal drugs. Some items may interact with your medicine. What should I watch for while using this medicine? Your condition will be monitored carefully while you are receiving this medicine. You will need important blood work done while you are taking this medicine. This drug may make you feel generally unwell. This is not uncommon, as chemotherapy can affect healthy cells as well as cancer cells. Report any side effects. Continue your course of treatment even though you feel ill unless your doctor tells you to stop. In some cases, you may be given additional medicines to help with side effects. Follow all directions for their use. Call your doctor or health care professional for advice if you get a fever, chills or sore throat, or other symptoms of a cold or flu. Do not treat yourself. This drug decreases your body's ability to fight infections. Try to avoid being around people who are sick. This medicine may increase your risk to bruise or bleed. Call your doctor or health care professional if you notice any unusual bleeding. Be careful brushing and flossing your teeth or using a toothpick because you may get an infection or bleed more easily. If you have any dental work done, tell your dentist you are receiving this medicine. Avoid taking products that contain aspirin, acetaminophen, ibuprofen, naproxen, or ketoprofen unless instructed by your doctor. These medicines may hide a fever.   Do not become pregnant while taking this medicine.  Women should inform their doctor if they wish to become pregnant or think they might be pregnant. There is a potential for serious side effects to an unborn child. Talk to your health care professional or pharmacist for more information. Do not breast-feed an infant while taking this medicine. Men are advised not to father a child while receiving this medicine. What side effects may I notice from receiving this medicine? Side effects that you should report to your doctor or health care professional as soon as possible: -allergic reactions like skin rash, itching or hives, swelling of the face, lips, or tongue -low blood counts - This drug may decrease the number of white blood cells, red blood cells and platelets. You may be at increased risk for infections and bleeding. -signs of infection - fever or chills, cough, sore throat, pain or difficulty passing urine -signs of decreased platelets or bleeding - bruising, pinpoint red spots on the skin, black, tarry stools, nosebleeds -signs of decreased red blood cells - unusually weak or tired, fainting spells, lightheadedness -breathing problems -chest pain -high or low blood pressure -mouth sores -nausea and vomiting -pain, swelling, redness or irritation at the injection site -pain, tingling, numbness in the hands or feet -slow or irregular heartbeat -swelling of the ankle, feet, hands Side effects that usually do not require medical attention (report to your doctor or health care professional if they continue or are bothersome): -bone pain -complete hair loss including hair on your head, underarms, pubic hair, eyebrows, and eyelashes -changes in the color of fingernails -diarrhea -loosening of the fingernails -loss of appetite -muscle or joint pain -red flush to skin -sweating This list may not describe all possible side effects. Call your doctor for medical advice about side effects. You may report side effects to FDA at  1-800-FDA-1088. Where should I keep my medicine? This drug is given in a hospital or clinic and will not be stored at home. NOTE: This sheet is a summary. It may not cover all possible information. If you have questions about this medicine, talk to your doctor, pharmacist, or health care provider.  2015, Elsevier/Gold Standard. (2012-08-24 16:41:21)  Carboplatin injection What is this medicine? CARBOPLATIN (KAR boe pla tin) is a chemotherapy drug. It targets fast dividing cells, like cancer cells, and causes these cells to die. This medicine is used to treat ovarian cancer and many other cancers. This medicine may be used for other purposes; ask your health care provider or pharmacist if you have questions. COMMON BRAND NAME(S): Paraplatin What should I tell my health care provider before I take this medicine? They need to know if you have any of these conditions: -blood disorders -hearing problems -kidney disease -recent or ongoing radiation therapy -an unusual or allergic reaction to carboplatin, cisplatin, other chemotherapy, other medicines, foods, dyes, or preservatives -pregnant or trying to get pregnant -breast-feeding How should I use this medicine? This drug is usually given as an infusion into a vein. It is administered in a hospital or clinic by a specially trained health care professional. Talk to your pediatrician regarding the use of this medicine in children. Special care may be needed. Overdosage: If you think you have taken too much of this medicine contact a poison control center or emergency room at once. NOTE: This medicine is only for you. Do not share this medicine with others. What if I miss a dose? It is important not to miss a dose. Call your   doctor or health care professional if you are unable to keep an appointment. What may interact with this medicine? -medicines for seizures -medicines to increase blood counts like filgrastim, pegfilgrastim,  sargramostim -some antibiotics like amikacin, gentamicin, neomycin, streptomycin, tobramycin -vaccines Talk to your doctor or health care professional before taking any of these medicines: -acetaminophen -aspirin -ibuprofen -ketoprofen -naproxen This list may not describe all possible interactions. Give your health care provider a list of all the medicines, herbs, non-prescription drugs, or dietary supplements you use. Also tell them if you smoke, drink alcohol, or use illegal drugs. Some items may interact with your medicine. What should I watch for while using this medicine? Your condition will be monitored carefully while you are receiving this medicine. You will need important blood work done while you are taking this medicine. This drug may make you feel generally unwell. This is not uncommon, as chemotherapy can affect healthy cells as well as cancer cells. Report any side effects. Continue your course of treatment even though you feel ill unless your doctor tells you to stop. In some cases, you may be given additional medicines to help with side effects. Follow all directions for their use. Call your doctor or health care professional for advice if you get a fever, chills or sore throat, or other symptoms of a cold or flu. Do not treat yourself. This drug decreases your body's ability to fight infections. Try to avoid being around people who are sick. This medicine may increase your risk to bruise or bleed. Call your doctor or health care professional if you notice any unusual bleeding. Be careful brushing and flossing your teeth or using a toothpick because you may get an infection or bleed more easily. If you have any dental work done, tell your dentist you are receiving this medicine. Avoid taking products that contain aspirin, acetaminophen, ibuprofen, naproxen, or ketoprofen unless instructed by your doctor. These medicines may hide a fever. Do not become pregnant while taking this  medicine. Women should inform their doctor if they wish to become pregnant or think they might be pregnant. There is a potential for serious side effects to an unborn child. Talk to your health care professional or pharmacist for more information. Do not breast-feed an infant while taking this medicine. What side effects may I notice from receiving this medicine? Side effects that you should report to your doctor or health care professional as soon as possible: -allergic reactions like skin rash, itching or hives, swelling of the face, lips, or tongue -signs of infection - fever or chills, cough, sore throat, pain or difficulty passing urine -signs of decreased platelets or bleeding - bruising, pinpoint red spots on the skin, black, tarry stools, nosebleeds -signs of decreased red blood cells - unusually weak or tired, fainting spells, lightheadedness -breathing problems -changes in hearing -changes in vision -chest pain -high blood pressure -low blood counts - This drug may decrease the number of white blood cells, red blood cells and platelets. You may be at increased risk for infections and bleeding. -nausea and vomiting -pain, swelling, redness or irritation at the injection site -pain, tingling, numbness in the hands or feet -problems with balance, talking, walking -trouble passing urine or change in the amount of urine Side effects that usually do not require medical attention (report to your doctor or health care professional if they continue or are bothersome): -hair loss -loss of appetite -metallic taste in the mouth or changes in taste This list may not describe all   possible side effects. Call your doctor for medical advice about side effects. You may report side effects to FDA at 1-800-FDA-1088. Where should I keep my medicine? This drug is given in a hospital or clinic and will not be stored at home. NOTE: This sheet is a summary. It may not cover all possible information. If you  have questions about this medicine, talk to your doctor, pharmacist, or health care provider.  2015, Elsevier/Gold Standard. (2007-10-06 14:38:05)  

## 2014-04-29 ENCOUNTER — Ambulatory Visit: Payer: Medicare HMO

## 2014-04-29 ENCOUNTER — Ambulatory Visit
Admission: RE | Admit: 2014-04-29 | Discharge: 2014-04-29 | Disposition: A | Discharge status: Home / Self Care | Payer: Medicare HMO | Source: Ambulatory Visit | Attending: Radiation Oncology | Admitting: Radiation Oncology

## 2014-04-29 DIAGNOSIS — Z510 Encounter for antineoplastic radiation therapy: Secondary | ICD-10-CM | POA: Diagnosis not present

## 2014-04-29 LAB — IRON AND TIBC CHCC
%SAT: 38 % (ref 20–55)
Iron: 47 ug/dL (ref 42–163)
TIBC: 125 ug/dL — ABNORMAL LOW (ref 202–409)
UIBC: 78 ug/dL — ABNORMAL LOW (ref 117–376)

## 2014-04-29 LAB — FERRITIN CHCC

## 2014-05-01 ENCOUNTER — Other Ambulatory Visit: Payer: Self-pay | Admitting: Hematology & Oncology

## 2014-05-02 ENCOUNTER — Ambulatory Visit: Payer: Medicare HMO

## 2014-05-02 ENCOUNTER — Ambulatory Visit
Admission: RE | Admit: 2014-05-02 | Discharge: 2014-05-02 | Disposition: A | Discharge status: Home / Self Care | Payer: Medicare HMO | Source: Ambulatory Visit | Attending: Radiation Oncology | Admitting: Radiation Oncology

## 2014-05-02 DIAGNOSIS — Z510 Encounter for antineoplastic radiation therapy: Secondary | ICD-10-CM | POA: Diagnosis not present

## 2014-05-03 ENCOUNTER — Ambulatory Visit
Admission: RE | Admit: 2014-05-03 | Discharge: 2014-05-03 | Disposition: A | Discharge status: Home / Self Care | Payer: Medicare HMO | Source: Ambulatory Visit | Attending: Radiation Oncology | Admitting: Radiation Oncology

## 2014-05-03 ENCOUNTER — Ambulatory Visit (HOSPITAL_BASED_OUTPATIENT_CLINIC_OR_DEPARTMENT_OTHER): Payer: Medicare HMO

## 2014-05-03 ENCOUNTER — Ambulatory Visit: Payer: Medicare HMO

## 2014-05-03 VITALS — BP 77/52 | HR 101 | Temp 98.2°F | Resp 18

## 2014-05-03 VITALS — BP 88/44 | HR 174 | Temp 97.7°F | Resp 16 | Ht 69.0 in | Wt 180.6 lb

## 2014-05-03 DIAGNOSIS — Z510 Encounter for antineoplastic radiation therapy: Secondary | ICD-10-CM | POA: Diagnosis not present

## 2014-05-03 DIAGNOSIS — E860 Dehydration: Secondary | ICD-10-CM

## 2014-05-03 DIAGNOSIS — C3402 Malignant neoplasm of left main bronchus: Secondary | ICD-10-CM

## 2014-05-03 MED ORDER — SODIUM CHLORIDE 0.9 % IV SOLN
1000.0000 mL | INTRAVENOUS | Status: DC
  Administered 2014-05-03: 13:00:00 via INTRAVENOUS

## 2014-05-03 MED ORDER — HEPARIN SOD (PORK) LOCK FLUSH 100 UNIT/ML IV SOLN
500.0000 [IU] | Freq: Once | INTRAVENOUS | Status: AC
  Administered 2014-05-03: 500 [IU] via INTRAVENOUS
  Filled 2014-05-03: qty 5

## 2014-05-03 MED ORDER — SODIUM CHLORIDE 0.9 % IJ SOLN
10.0000 mL | INTRAMUSCULAR | Status: DC | PRN
  Administered 2014-05-03: 10 mL via INTRAVENOUS
  Filled 2014-05-03: qty 10

## 2014-05-03 NOTE — Progress Notes (Signed)
  Radiation Oncology         [phone] ________________________________  Name: Gregory Farrell MRN: [id number]  Date: 05/03/2014  DOB: Mar 28, 1933  Weekly Radiation Therapy Management  Lung cancer, hilus   Primary site: Lung   Staging method: AJCC 7th Edition   Clinical: Stage IIIA (T3, N2, M0)    Summary: Stage IIIA (T3, N2, M0)  39.6 Current Dose: 39.6 Gy     Planned Dose:  63 Gy  Narrative . . . . . . . . The patient presents for routine under treatment assessment.                                   The patient is feeling weak again. He is not been eating and drinking as his family members and staff have recommended. Patient does feel dizzy upon standing. He denies any odynophagia                                 Set-up films were reviewed.                                 The chart was checked. Physical Findings. . .  height is 5\' 9"  (1.753 m) and weight is 180 lb 9.6 oz (81.92 kg). His oral temperature is 97.7 F (36.5 C). His blood pressure is 88/44 and his pulse is 174. His respiration is 16 and oxygen saturation is 98%. . The patient's pulse by my exam 105. Orthostatic blood pressure changes are present consistent with dehydration Impression . . . . . . . The patient is tolerating radiation. Plan . . . . . . . . . . . . Continue treatment as planned.  The patient will be set up for IV fluid supplementation today. I am unsure whether he will be ready for additional chemotherapy on Thursday.  ________________________________   Blair Promise, PhD, MD

## 2014-05-03 NOTE — Progress Notes (Signed)
Gregory Farrell has completed 22 fractions to his left lung.  He denies pain and is using a fentanyl patch.  He denies a sore throat or burning when eating.  He reports feeling dizzy after getting off the treatment table today.  He has lost 3 lbs since last week and reports a poor appetite.  Orthostatic vitals done bp sitting 101/66, hr 77, bp standing 88/44, hr 174.  He reports he has an occasional cough.  He denies hemoptysis.  He denies shortness of breath.  He is weak when standing.  He will have chemotherapy on Thursday.  The skin on his left chest is dry and he has a red area on his left upper back.  Advised him to start using radiaplex.

## 2014-05-03 NOTE — Progress Notes (Signed)
1600 sitting BP 114/62, HR 93 1604 standing BP 77/52, HR 101; patient denies lightheadedness or dizziness, states "i feel fine" [place], RN rad onc notified of the above.  1615 per Dr. Sondra Come via Elmo Putt, RN, OK to discharge patient if asymptomatic but MD states if patient feels ill he will need to go to ED. He will be rechecked at rad onc appt tomorrow. Patient states he feels OK to go home. Patient encouraged to drink water and non-caffeinated liquids at home, he and son-in-law voice understanding. Discharged to home in wheelchair stable and in no apparent distress.

## 2014-05-03 NOTE — Addendum Note (Signed)
Encounter addended by: Blair Promise, MD on: 05/03/2014 12:33 PM<BR>     Documentation filed: Orders

## 2014-05-03 NOTE — Patient Instructions (Signed)
Dehydration, Adult Dehydration is when you lose more fluids from the body than you take in. Vital organs like the kidneys, brain, and heart cannot function without a proper amount of fluids and salt. Any loss of fluids from the body can cause dehydration.  CAUSES   Vomiting.  Diarrhea.  Excessive sweating.  Excessive urine output.  Fever. SYMPTOMS  Mild dehydration  Thirst.  Dry lips.  Slightly dry mouth. Moderate dehydration  Very dry mouth.  Sunken eyes.  Skin does not bounce back quickly when lightly pinched and released.  Dark urine and decreased urine production.  Decreased tear production.  Headache. Severe dehydration  Very dry mouth.  Extreme thirst.  Rapid, weak pulse (more than 100 beats per minute at rest).  Cold hands and feet.  Not able to sweat in spite of heat and temperature.  Rapid breathing.  Blue lips.  Confusion and lethargy.  Difficulty being awakened.  Minimal urine production.  No tears. DIAGNOSIS  Your caregiver will diagnose dehydration based on your symptoms and your exam. Blood and urine tests will help confirm the diagnosis. The diagnostic evaluation should also identify the cause of dehydration. TREATMENT  Treatment of mild or moderate dehydration can often be done at home by increasing the amount of fluids that you drink. It is best to drink small amounts of fluid more often. Drinking too much at one time can make vomiting worse. Refer to the home care instructions below. Severe dehydration needs to be treated at the hospital where you will probably be given intravenous (IV) fluids that contain water and electrolytes. HOME CARE INSTRUCTIONS   Ask your caregiver about specific rehydration instructions.  Drink enough fluids to keep your urine clear or pale yellow.  Drink small amounts frequently if you have nausea and vomiting.  Eat as you normally do.  Avoid:  Foods or drinks high in sugar.  Carbonated  drinks.  Juice.  Extremely hot or cold fluids.  Drinks with caffeine.  Fatty, greasy foods.  Alcohol.  Tobacco.  Overeating.  Gelatin desserts.  Wash your hands well to avoid spreading bacteria and viruses.  Only take over-the-counter or prescription medicines for pain, discomfort, or fever as directed by your caregiver.  Ask your caregiver if you should continue all prescribed and over-the-counter medicines.  Keep all follow-up appointments with your caregiver. SEEK MEDICAL CARE IF:  You have abdominal pain and it increases or stays in one area (localizes).  You have a rash, stiff neck, or severe headache.  You are irritable, sleepy, or difficult to awaken.  You are weak, dizzy, or extremely thirsty. SEEK IMMEDIATE MEDICAL CARE IF:   You are unable to keep fluids down or you get worse despite treatment.  You have frequent episodes of vomiting or diarrhea.  You have blood or green matter (bile) in your vomit.  You have blood in your stool or your stool looks black and tarry.  You have not urinated in 6 to 8 hours, or you have only urinated a small amount of very dark urine.  You have a fever.  You faint. MAKE SURE YOU:   Understand these instructions.  Will watch your condition.  Will get help right away if you are not doing well or get worse. Document Released: 07/01/2005 Document Revised: 09/23/2011 Document Reviewed: 02/18/2011 [employer] Patient Information 2015 [employer]. This information is not intended to replace advice given to you by your health care provider. Make sure you discuss any questions you have with your health care   provider. Hypotension As your heart beats, it forces blood through your arteries. This force is your blood pressure. If your blood pressure is too low for you to go about your normal activities or to support the organs of your body, you have hypotension. Hypotension is also referred to as low blood pressure. When your blood  pressure becomes too low, you may not get enough blood to your brain. As a result, you may feel weak, feel lightheaded, or develop a rapid heart rate. In a more severe case, you may faint. CAUSES Various conditions can cause hypotension. These include:  Blood loss.  Dehydration.  Heart or endocrine problems.  Pregnancy.  Severe infection.  Not having a well-balanced diet filled with needed nutrients.  Severe allergic reactions (anaphylaxis). Some medicines, such as blood pressure medicine or water pills (diuretics), may lower your blood pressure below normal. Sometimes taking too much medicine or taking medicine not as directed can cause hypotension. TREATMENT  Hospitalization is sometimes required for hypotension if fluid or blood replacement is needed, if time is needed for medicines to wear off, or if further monitoring is needed. Treatment might include changing your diet, changing your medicines (including medicines aimed at raising your blood pressure), and use of support stockings. HOME CARE INSTRUCTIONS   Drink enough fluids to keep your urine clear or pale yellow.  Take your medicines as directed by your health care provider.  Get up slowly from reclining or sitting positions. This gives your blood pressure a chance to adjust.  Wear support stockings as directed by your health care provider.  Maintain a healthy diet by including nutritious food, such as fruits, vegetables, nuts, whole grains, and lean meats. SEEK MEDICAL CARE IF:  You have vomiting or diarrhea.  You have a fever for more than 2-3 days.  You feel more thirsty than usual.  You feel weak and tired. SEEK IMMEDIATE MEDICAL CARE IF:   You have chest pain or a fast or irregular heartbeat.  You have a loss of feeling in some part of your body, or you lose movement in your arms or legs.  You have trouble speaking.  You become sweaty or feel lightheaded.  You faint. MAKE SURE YOU:   Understand  these instructions.  Will watch your condition.  Will get help right away if you are not doing well or get worse. Document Released: 07/01/2005 Document Revised: 04/21/2013 Document Reviewed: 01/01/2013 [facility] Patient Information 2015 [place], [place]. This information is not intended to replace advice given to you by your health care provider. Make sure you discuss any questions you have with your health care provider.

## 2014-05-04 ENCOUNTER — Ambulatory Visit
Admission: RE | Admit: 2014-05-04 | Discharge: 2014-05-04 | Disposition: A | Discharge status: Home / Self Care | Payer: Medicare HMO | Source: Ambulatory Visit | Attending: Radiation Oncology | Admitting: Radiation Oncology

## 2014-05-04 ENCOUNTER — Ambulatory Visit: Payer: Medicare HMO

## 2014-05-04 VITALS — BP 92/58 | HR 111 | Temp 97.9°F | Resp 16

## 2014-05-04 DIAGNOSIS — C3402 Malignant neoplasm of left main bronchus: Secondary | ICD-10-CM | POA: Insufficient documentation

## 2014-05-04 DIAGNOSIS — Z510 Encounter for antineoplastic radiation therapy: Secondary | ICD-10-CM | POA: Diagnosis present

## 2014-05-04 MED ORDER — RADIAPLEXRX EX GEL
Freq: Once | CUTANEOUS | Status: AC
  Administered 2014-05-04: 17:00:00 via TOPICAL

## 2014-05-04 NOTE — Addendum Note (Signed)
Encounter addended by: Jacqulyn Liner, RN on: 05/04/2014  5:15 PM<BR>     Documentation filed: Inpatient MAR, Orders

## 2014-05-04 NOTE — Progress Notes (Signed)
Gregory Farrell is here for a vital sign check.  He says he feels better today but did feel dizzy after getting off the table.  Orthostatic vitals done bp sitting 110/68, hr 89, bp standing 92/58, hr 111.  He also has a skin tear on his left forearm.  Refill of radiaplex also given.

## 2014-05-04 NOTE — Progress Notes (Signed)
Patient has skin tear on left posterior forearm from falling against doorway last week. The area has dried blood, some skin is dark on edges but no drainage or odor noted. Patient states he has not been doing any treatment to this area. Antibiotic ointment applied with sterile q tips, non adherent dressing applied and wrapped in Kerlix secured with paper tape. Daughter states he has appointment tomorrow with Dr Marin Olp, and she will have Dr Marin Olp look at this area. Instructed patient to keep this area covered and dry until Dr Marin Olp can assess. Daughter and patient verbalized understanding.

## 2014-05-05 ENCOUNTER — Ambulatory Visit (HOSPITAL_BASED_OUTPATIENT_CLINIC_OR_DEPARTMENT_OTHER): Payer: Medicare HMO | Admitting: Family

## 2014-05-05 ENCOUNTER — Ambulatory Visit: Payer: Medicare HMO

## 2014-05-05 ENCOUNTER — Ambulatory Visit (HOSPITAL_BASED_OUTPATIENT_CLINIC_OR_DEPARTMENT_OTHER): Payer: Medicare HMO

## 2014-05-05 ENCOUNTER — Other Ambulatory Visit (HOSPITAL_BASED_OUTPATIENT_CLINIC_OR_DEPARTMENT_OTHER): Payer: Medicare HMO | Admitting: Lab

## 2014-05-05 ENCOUNTER — Other Ambulatory Visit: Payer: Medicare HMO | Admitting: Lab

## 2014-05-05 ENCOUNTER — Encounter: Payer: Self-pay | Admitting: Family

## 2014-05-05 ENCOUNTER — Ambulatory Visit
Admission: RE | Admit: 2014-05-05 | Discharge: 2014-05-05 | Disposition: A | Discharge status: Home / Self Care | Payer: Medicare HMO | Source: Ambulatory Visit | Attending: Radiation Oncology | Admitting: Radiation Oncology

## 2014-05-05 VITALS — BP 100/60 | HR 84 | Temp 98.0°F | Resp 18 | Ht 69.0 in | Wt 179.0 lb

## 2014-05-05 VITALS — BP 100/60 | HR 88 | Temp 98.0°F | Resp 16

## 2014-05-05 DIAGNOSIS — R42 Dizziness and giddiness: Secondary | ICD-10-CM

## 2014-05-05 DIAGNOSIS — D509 Iron deficiency anemia, unspecified: Secondary | ICD-10-CM

## 2014-05-05 DIAGNOSIS — Z510 Encounter for antineoplastic radiation therapy: Secondary | ICD-10-CM | POA: Diagnosis not present

## 2014-05-05 DIAGNOSIS — R918 Other nonspecific abnormal finding of lung field: Secondary | ICD-10-CM

## 2014-05-05 DIAGNOSIS — C3492 Malignant neoplasm of unspecified part of left bronchus or lung: Secondary | ICD-10-CM

## 2014-05-05 DIAGNOSIS — D696 Thrombocytopenia, unspecified: Secondary | ICD-10-CM

## 2014-05-05 DIAGNOSIS — I959 Hypotension, unspecified: Secondary | ICD-10-CM

## 2014-05-05 DIAGNOSIS — C3402 Malignant neoplasm of left main bronchus: Secondary | ICD-10-CM

## 2014-05-05 DIAGNOSIS — Z5111 Encounter for antineoplastic chemotherapy: Secondary | ICD-10-CM

## 2014-05-05 DIAGNOSIS — R110 Nausea: Secondary | ICD-10-CM

## 2014-05-05 DIAGNOSIS — C3490 Malignant neoplasm of unspecified part of unspecified bronchus or lung: Secondary | ICD-10-CM

## 2014-05-05 LAB — CMP (CANCER CENTER ONLY)
ALT: 15 U/L (ref 10–47)
AST: 19 U/L (ref 11–38)
Albumin: 2.8 g/dL — ABNORMAL LOW (ref 3.3–5.5)
Alkaline Phosphatase: 95 U/L — ABNORMAL HIGH (ref 26–84)
BUN, Bld: 9 mg/dL (ref 7–22)
CO2: 27 meq/L (ref 18–33)
Calcium: 8.9 mg/dL (ref 8.0–10.3)
Chloride: 98 mEq/L (ref 98–108)
Creat: 1.4 mg/dl — ABNORMAL HIGH (ref 0.6–1.2)
Glucose, Bld: 145 mg/dL — ABNORMAL HIGH (ref 73–118)
Potassium: 4.3 mEq/L (ref 3.3–4.7)
Sodium: 138 mEq/L (ref 128–145)
Total Bilirubin: 0.7 mg/dl (ref 0.20–1.60)
Total Protein: 6.3 g/dL — ABNORMAL LOW (ref 6.4–8.1)

## 2014-05-05 LAB — CBC WITH DIFFERENTIAL (CANCER CENTER ONLY)
BASO#: 0 10*3/uL (ref 0.0–0.2)
BASO%: 0.3 % (ref 0.0–2.0)
EOS%: 1.3 % (ref 0.0–7.0)
Eosinophils Absolute: 0.1 10*3/uL (ref 0.0–0.5)
HCT: 38.2 % — ABNORMAL LOW (ref 38.7–49.9)
HEMOGLOBIN: 12.2 g/dL — AB (ref 13.0–17.1)
LYMPH#: 0.5 10*3/uL — AB (ref 0.9–3.3)
LYMPH%: 12.8 % — ABNORMAL LOW (ref 14.0–48.0)
MCH: 30.7 pg (ref 28.0–33.4)
MCHC: 31.9 g/dL — ABNORMAL LOW (ref 32.0–35.9)
MCV: 96 fL (ref 82–98)
MONO#: 0.4 10*3/uL (ref 0.1–0.9)
MONO%: 9.6 % (ref 0.0–13.0)
NEUT%: 76 % (ref 40.0–80.0)
NEUTROS ABS: 2.9 10*3/uL (ref 1.5–6.5)
Platelets: 156 10*3/uL (ref 145–400)
RBC: 3.98 10*6/uL — ABNORMAL LOW (ref 4.20–5.70)
RDW: 18.7 % — ABNORMAL HIGH (ref 11.1–15.7)
WBC: 3.8 10*3/uL — ABNORMAL LOW (ref 4.0–10.0)

## 2014-05-05 LAB — TECHNOLOGIST REVIEW CHCC SATELLITE

## 2014-05-05 MED ORDER — PACLITAXEL CHEMO INJECTION 300 MG/50ML
36.0000 mg/m2 | Freq: Once | INTRAVENOUS | Status: AC
  Administered 2014-05-05: 72 mg via INTRAVENOUS
  Filled 2014-05-05: qty 12

## 2014-05-05 MED ORDER — FAMOTIDINE IN NACL 20-0.9 MG/50ML-% IV SOLN
20.0000 mg | Freq: Once | INTRAVENOUS | Status: AC
  Administered 2014-05-05: 20 mg via INTRAVENOUS

## 2014-05-05 MED ORDER — SODIUM CHLORIDE 0.9 % IJ SOLN
10.0000 mL | INTRAMUSCULAR | Status: DC | PRN
  Administered 2014-05-05: 10 mL
  Filled 2014-05-05: qty 10

## 2014-05-05 MED ORDER — DIPHENHYDRAMINE HCL 25 MG PO CAPS
ORAL_CAPSULE | ORAL | Status: AC
  Filled 2014-05-05: qty 1

## 2014-05-05 MED ORDER — HEPARIN SOD (PORK) LOCK FLUSH 100 UNIT/ML IV SOLN
500.0000 [IU] | Freq: Once | INTRAVENOUS | Status: AC | PRN
Start: 2014-05-05 — End: 2014-05-05
  Administered 2014-05-05: 500 [IU]
  Filled 2014-05-05: qty 5

## 2014-05-05 MED ORDER — DEXAMETHASONE SODIUM PHOSPHATE 20 MG/5ML IJ SOLN
INTRAMUSCULAR | Status: AC
Start: 2014-05-05 — End: 2014-05-05
  Filled 2014-05-05: qty 5

## 2014-05-05 MED ORDER — ONDANSETRON 16 MG/50ML IVPB (CHCC)
16.0000 mg | Freq: Once | INTRAVENOUS | Status: AC
  Administered 2014-05-05: 16 mg via INTRAVENOUS

## 2014-05-05 MED ORDER — ONDANSETRON 16 MG/50ML IVPB (CHCC)
INTRAVENOUS | Status: AC
  Filled 2014-05-05: qty 16

## 2014-05-05 MED ORDER — FAMOTIDINE IN NACL 20-0.9 MG/50ML-% IV SOLN
INTRAVENOUS | Status: AC
  Filled 2014-05-05: qty 50

## 2014-05-05 MED ORDER — DEXAMETHASONE SODIUM PHOSPHATE 20 MG/5ML IJ SOLN
20.0000 mg | Freq: Once | INTRAMUSCULAR | Status: AC
  Administered 2014-05-05: 20 mg via INTRAVENOUS

## 2014-05-05 MED ORDER — SODIUM CHLORIDE 0.9 % IV SOLN
120.0000 mg | Freq: Once | INTRAVENOUS | Status: AC
  Administered 2014-05-05: 120 mg via INTRAVENOUS
  Filled 2014-05-05: qty 12

## 2014-05-05 MED ORDER — DIPHENHYDRAMINE HCL 25 MG PO CAPS
25.0000 mg | ORAL_CAPSULE | Freq: Once | ORAL | Status: AC
  Administered 2014-05-05: 25 mg via ORAL

## 2014-05-05 MED ORDER — SODIUM CHLORIDE 0.9 % IV SOLN
Freq: Once | INTRAVENOUS | Status: AC
  Administered 2014-05-05: 15:00:00 via INTRAVENOUS

## 2014-05-05 NOTE — Progress Notes (Signed)
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 He is feeling better. He is eating better and drinking lots of fluids. He still has some SOB with exertion. He stumbled and fell against a wall last week and got a skin tear on his left arm. It looks better this week. I instructed him to keep it clean and covered. He denies fever, chills, cough, rash, n/v, headache, chest pain, palpitations, abdominal pain, constipation, diarrhea, blood in urine or stool. He has had no swelling, tenderness, numbness or tingling in his extremities. He has had no bleeding or pain.   CURRENT TREATMENT: Patient started radiation and chemotherapy within one week  REVIEW OF SYSTEMS: All other 10 point review of systems is negative except for those issues mentioned above.  PAST MEDICAL HISTORY: Past Medical History  Diagnosis Date  . Hypertension   . COPD (chronic obstructive pulmonary disease)   . GERD (gastroesophageal reflux disease)   . Renal insufficiency   . Hyperlipidemia   . CAD (coronary artery disease)   . B12 deficiency   . Memory loss   . Vertigo   . Diabetes mellitus     type II, poly neuropathy  . Overweight 01/31/2013  . Lung cancer, hilus 03/22/2014   PAST SURGICAL HISTORY: Past Surgical History  Procedure Laterality Date  . Coronary angioplasty with stent placement    . Inguinal hernia repair  2010  . Video bronchoscopy Bilateral 03/07/2014    Procedure: VIDEO BRONCHOSCOPY WITHOUT FLUORO;  Surgeon: Kathee Delton, MD;  Location: [facility];  Service: Cardiopulmonary;  Laterality: Bilateral;   FAMILY HISTORY Family History  Problem  Relation Age of Onset  . Coronary artery disease Mother   . Breast cancer Mother   . Diabetes Mother   . Leukemia Brother    GYNECOLOGIC HISTORY:  No LMP for male patient.   SOCIAL HISTORY:  History   Social History  . Marital Status: Married    Spouse Name: N/A    Number of Children: 1  . Years of Education: N/A   Occupational History  . Retired     [employer]   Social History Main Topics  . Smoking status: Former Smoker -- 1.00 packs/day for 20 years    Types: Cigarettes    Start date: 09/09/1982    Quit date: 07/15/2002  . Smokeless tobacco: Never Used     Comment: quit smoking 11 years ago  . Alcohol Use: No  . Drug Use: Not on file  . Sexual Activity: Not on file   Other Topics Concern  . Not on file   Social History Narrative  . No narrative on file   ADVANCED DIRECTIVES: <no information>  HEALTH MAINTENANCE: History  Substance Use Topics  . Smoking status: Former Smoker -- 1.00 packs/day for 20 years    Types: Cigarettes    Start date: 09/09/1982    Quit date: 07/15/2002  . Smokeless tobacco: Never Used     Comment: quit smoking 11 years ago  . Alcohol Use: No   Colonoscopy: PAP: Bone density: Lipid panel:  Allergies  Allergen Reactions  . Exenatide     REACTION: nausea    Current Outpatient  Prescriptions  Medication Sig Dispense Refill  . albuterol (PROVENTIL HFA;VENTOLIN HFA) 108 (90 BASE) MCG/ACT inhaler Inhale 2 puffs into the lungs every 6 (six) hours as needed for wheezing or shortness of breath.      Marland Kitchen aspirin 81 MG tablet Take 81 mg by mouth daily.        Marland Kitchen atorvastatin (LIPITOR) 80 MG tablet TAKE ONE TABLET BY MOUTH ONE TIME DAILY   90 tablet  1  . budesonide-formoterol (SYMBICORT) 160-4.5 MCG/ACT inhaler Inhale 2 puffs into the lungs 2 (two) times daily.      . citalopram (CELEXA) 20 MG tablet TAKE TWO TABLETS BY MOUTH DAILY   60 tablet  3  . fentaNYL (DURAGESIC - DOSED MCG/HR) 12 MCG/HR Place 1 patch (12.5 mcg total) onto the  skin every 3 (three) days.  10 patch  0  . insulin detemir (LEVEMIR) 100 UNIT/ML injection Inject 50 Units into the skin daily. May increase by 2 units every 3 days as directed      . lactulose (CHRONULAC) 10 GM/15ML solution Take 30 mLs (20 g total) by mouth 2 (two) times daily.  500 mL  0  . loperamide (IMODIUM) 1 MG/5ML solution Take 10 mLs (2 mg total) by mouth as needed for diarrhea or loose stools.  120 mL  2  . LORazepam (ATIVAN) 0.5 MG tablet Take 0.5 mg by mouth every 6 (six) hours as needed for anxiety.      . mometasone-formoterol (DULERA) 200-5 MCG/ACT AERO Inhale 2 puffs into the lungs 2 (two) times daily.      Marland Kitchen morphine (ROXANOL) 20 MG/ML concentrated solution Take 0.34ml-0.5ml every 2hrs as needed for pain  120 mL  0  . ondansetron (ZOFRAN) 8 MG tablet Take 8 mg by mouth every 8 (eight) hours as needed for nausea or vomiting. Start after Chemo      . pantoprazole (PROTONIX) 40 MG tablet Take 40 mg by mouth daily.      . prochlorperazine (COMPAZINE) 10 MG tablet Take 10 mg by mouth every 6 (six) hours as needed for nausea or vomiting.      . sucralfate (CARAFATE) 1 GM/10ML suspension Take 10 mLs (1 g total) by mouth 4 (four) times daily -  with meals and at bedtime.  420 mL  0  . Wound Cleansers (RADIAPLEX EX) Apply topically.       No current facility-administered medications for this visit.   OBJECTIVE: Filed Vitals:   05/05/14 1337  BP: 100/60  Pulse: 84  Temp: 98 F (36.7 C)  Resp: 18   Body mass index is 26.42 kg/(m^2). ECOG FS:0 - Asymptomatic Ocular: Sclerae unicteric, pupils equal, round and reactive to light Ear-nose-throat: Oropharynx clear, dentition fair Lymphatic: No cervical or supraclavicular adenopathy Lungs no rales or rhonchi, good excursion bilaterally Heart regular rate and rhythm, no murmur appreciated Abd soft, nontender, positive bowel sounds MSK no focal spinal tenderness, no joint edema Neuro: non-focal, well-oriented, appropriate affect  LAB  RESULTS: CMP     Component Value Date/Time   NA 138 04/28/2014 1316   NA 136 04/12/2014 1317   NA 135* 03/30/2014 1305   K 3.8 04/28/2014 1316   K 4.6 04/12/2014 1317   K 4.4 03/30/2014 1305   CL 97* 04/28/2014 1316   CL 100 03/30/2014 1305   CO2 27 04/28/2014 1316   CO2 26 04/12/2014 1317   CO2 20 03/30/2014 1305   GLUCOSE 164* 04/28/2014 1316   GLUCOSE 158* 04/12/2014 1317  GLUCOSE 95 03/30/2014 1305   BUN 12 04/28/2014 1316   BUN 22.3 04/12/2014 1317   BUN 39* 03/30/2014 1305   CREATININE 1.5* 04/28/2014 1316   CREATININE 1.5* 04/12/2014 1317   CREATININE 1.93* 03/30/2014 1305   CALCIUM 8.4 04/28/2014 1316   CALCIUM 9.2 04/12/2014 1317   CALCIUM 9.4 03/30/2014 1305   PROT 6.1* 04/28/2014 1316   PROT 6.9 03/22/2014 1109   ALBUMIN 3.4* 03/22/2014 1109   AST 17 04/28/2014 1316   AST 17 03/22/2014 1109   ALT 9* 04/28/2014 1316   ALT 12 03/22/2014 1109   ALKPHOS 103* 04/28/2014 1316   ALKPHOS 105 03/22/2014 1109   BILITOT 0.90 04/28/2014 1316   BILITOT 0.9 03/22/2014 1109   GFRNONAA 31* 03/30/2014 1305   GFRAA 36* 03/30/2014 1305   No results found for this basename: SPEP,  UPEP,   kappa and lambda light chains   Lab Results  Component Value Date   WBC 3.8* 05/05/2014   NEUTROABS 2.9 05/05/2014   HGB 12.2* 05/05/2014   HCT 38.2* 05/05/2014   MCV 96 05/05/2014   PLT 156 05/05/2014   No results found for this basename: LABCA2   No components found with this basename: SHUOH729   No results found for this basename: INR,  in the last 168 hours STUDIES: None  ASSESSMENT/PLAN: Gregory Farrell is 80 year old gentleman with locally advanced, inoperable non-small cell lung cancer of the left lung. This is squamous cell carcinoma. He is having issues with hypotension, nausea and dizziness.  We will proceed with treatment today.   His CBC and CMP today were ok.  We will see him back for a follow-up and labs in 1 week with his last treatment.  He knows to call here with any questions or concerns and to  go to the ED in the event of an emergency. We can certainly see him sooner if need be.  Eliezer Bottom, NP 05/05/2014 1:54 PM

## 2014-05-05 NOTE — Patient Instructions (Signed)
Paclitaxel injection What is this medicine? PACLITAXEL (PAK li TAX el) is a chemotherapy drug. It targets fast dividing cells, like cancer cells, and causes these cells to die. This medicine is used to treat ovarian cancer, breast cancer, and other cancers. This medicine may be used for other purposes; ask your health care provider or pharmacist if you have questions. COMMON BRAND NAME(S): Onxol, Taxol What should I tell my health care provider before I take this medicine? They need to know if you have any of these conditions: -blood disorders -irregular heartbeat -infection (especially a virus infection such as chickenpox, cold sores, or herpes) -liver disease -previous or ongoing radiation therapy -an unusual or allergic reaction to paclitaxel, alcohol, polyoxyethylated castor oil, other chemotherapy agents, other medicines, foods, dyes, or preservatives -pregnant or trying to get pregnant -breast-feeding How should I use this medicine? This drug is given as an infusion into a vein. It is administered in a hospital or clinic by a specially trained health care professional. Talk to your pediatrician regarding the use of this medicine in children. Special care may be needed. Overdosage: If you think you have taken too much of this medicine contact a poison control center or emergency room at once. NOTE: This medicine is only for you. Do not share this medicine with others. What if I miss a dose? It is important not to miss your dose. Call your doctor or health care professional if you are unable to keep an appointment. What may interact with this medicine? Do not take this medicine with any of the following medications: -disulfiram -metronidazole This medicine may also interact with the following medications: -cyclosporine -diazepam -ketoconazole -medicines to increase blood counts like filgrastim, pegfilgrastim, sargramostim -other chemotherapy drugs like cisplatin, doxorubicin,  epirubicin, etoposide, teniposide, vincristine -quinidine -testosterone -vaccines -verapamil Talk to your doctor or health care professional before taking any of these medicines: -acetaminophen -aspirin -ibuprofen -ketoprofen -naproxen This list may not describe all possible interactions. Give your health care provider a list of all the medicines, herbs, non-prescription drugs, or dietary supplements you use. Also tell them if you smoke, drink alcohol, or use illegal drugs. Some items may interact with your medicine. What should I watch for while using this medicine? Your condition will be monitored carefully while you are receiving this medicine. You will need important blood work done while you are taking this medicine. This drug may make you feel generally unwell. This is not uncommon, as chemotherapy can affect healthy cells as well as cancer cells. Report any side effects. Continue your course of treatment even though you feel ill unless your doctor tells you to stop. In some cases, you may be given additional medicines to help with side effects. Follow all directions for their use. Call your doctor or health care professional for advice if you get a fever, chills or sore throat, or other symptoms of a cold or flu. Do not treat yourself. This drug decreases your body's ability to fight infections. Try to avoid being around people who are sick. This medicine may increase your risk to bruise or bleed. Call your doctor or health care professional if you notice any unusual bleeding. Be careful brushing and flossing your teeth or using a toothpick because you may get an infection or bleed more easily. If you have any dental work done, tell your dentist you are receiving this medicine. Avoid taking products that contain aspirin, acetaminophen, ibuprofen, naproxen, or ketoprofen unless instructed by your doctor. These medicines may hide a fever.   Do not become pregnant while taking this medicine.  Women should inform their doctor if they wish to become pregnant or think they might be pregnant. There is a potential for serious side effects to an unborn child. Talk to your health care professional or pharmacist for more information. Do not breast-feed an infant while taking this medicine. Men are advised not to father a child while receiving this medicine. What side effects may I notice from receiving this medicine? Side effects that you should report to your doctor or health care professional as soon as possible: -allergic reactions like skin rash, itching or hives, swelling of the face, lips, or tongue -low blood counts - This drug may decrease the number of white blood cells, red blood cells and platelets. You may be at increased risk for infections and bleeding. -signs of infection - fever or chills, cough, sore throat, pain or difficulty passing urine -signs of decreased platelets or bleeding - bruising, pinpoint red spots on the skin, black, tarry stools, nosebleeds -signs of decreased red blood cells - unusually weak or tired, fainting spells, lightheadedness -breathing problems -chest pain -high or low blood pressure -mouth sores -nausea and vomiting -pain, swelling, redness or irritation at the injection site -pain, tingling, numbness in the hands or feet -slow or irregular heartbeat -swelling of the ankle, feet, hands Side effects that usually do not require medical attention (report to your doctor or health care professional if they continue or are bothersome): -bone pain -complete hair loss including hair on your head, underarms, pubic hair, eyebrows, and eyelashes -changes in the color of fingernails -diarrhea -loosening of the fingernails -loss of appetite -muscle or joint pain -red flush to skin -sweating This list may not describe all possible side effects. Call your doctor for medical advice about side effects. You may report side effects to FDA at  1-800-FDA-1088. Where should I keep my medicine? This drug is given in a hospital or clinic and will not be stored at home. NOTE: This sheet is a summary. It may not cover all possible information. If you have questions about this medicine, talk to your doctor, pharmacist, or health care provider.  2015, Elsevier/Gold Standard. (2012-08-24 16:41:21)  Carboplatin injection What is this medicine? CARBOPLATIN (KAR boe pla tin) is a chemotherapy drug. It targets fast dividing cells, like cancer cells, and causes these cells to die. This medicine is used to treat ovarian cancer and many other cancers. This medicine may be used for other purposes; ask your health care provider or pharmacist if you have questions. COMMON BRAND NAME(S): Paraplatin What should I tell my health care provider before I take this medicine? They need to know if you have any of these conditions: -blood disorders -hearing problems -kidney disease -recent or ongoing radiation therapy -an unusual or allergic reaction to carboplatin, cisplatin, other chemotherapy, other medicines, foods, dyes, or preservatives -pregnant or trying to get pregnant -breast-feeding How should I use this medicine? This drug is usually given as an infusion into a vein. It is administered in a hospital or clinic by a specially trained health care professional. Talk to your pediatrician regarding the use of this medicine in children. Special care may be needed. Overdosage: If you think you have taken too much of this medicine contact a poison control center or emergency room at once. NOTE: This medicine is only for you. Do not share this medicine with others. What if I miss a dose? It is important not to miss a dose. Call your   doctor or health care professional if you are unable to keep an appointment. What may interact with this medicine? -medicines for seizures -medicines to increase blood counts like filgrastim, pegfilgrastim,  sargramostim -some antibiotics like amikacin, gentamicin, neomycin, streptomycin, tobramycin -vaccines Talk to your doctor or health care professional before taking any of these medicines: -acetaminophen -aspirin -ibuprofen -ketoprofen -naproxen This list may not describe all possible interactions. Give your health care provider a list of all the medicines, herbs, non-prescription drugs, or dietary supplements you use. Also tell them if you smoke, drink alcohol, or use illegal drugs. Some items may interact with your medicine. What should I watch for while using this medicine? Your condition will be monitored carefully while you are receiving this medicine. You will need important blood work done while you are taking this medicine. This drug may make you feel generally unwell. This is not uncommon, as chemotherapy can affect healthy cells as well as cancer cells. Report any side effects. Continue your course of treatment even though you feel ill unless your doctor tells you to stop. In some cases, you may be given additional medicines to help with side effects. Follow all directions for their use. Call your doctor or health care professional for advice if you get a fever, chills or sore throat, or other symptoms of a cold or flu. Do not treat yourself. This drug decreases your body's ability to fight infections. Try to avoid being around people who are sick. This medicine may increase your risk to bruise or bleed. Call your doctor or health care professional if you notice any unusual bleeding. Be careful brushing and flossing your teeth or using a toothpick because you may get an infection or bleed more easily. If you have any dental work done, tell your dentist you are receiving this medicine. Avoid taking products that contain aspirin, acetaminophen, ibuprofen, naproxen, or ketoprofen unless instructed by your doctor. These medicines may hide a fever. Do not become pregnant while taking this  medicine. Women should inform their doctor if they wish to become pregnant or think they might be pregnant. There is a potential for serious side effects to an unborn child. Talk to your health care professional or pharmacist for more information. Do not breast-feed an infant while taking this medicine. What side effects may I notice from receiving this medicine? Side effects that you should report to your doctor or health care professional as soon as possible: -allergic reactions like skin rash, itching or hives, swelling of the face, lips, or tongue -signs of infection - fever or chills, cough, sore throat, pain or difficulty passing urine -signs of decreased platelets or bleeding - bruising, pinpoint red spots on the skin, black, tarry stools, nosebleeds -signs of decreased red blood cells - unusually weak or tired, fainting spells, lightheadedness -breathing problems -changes in hearing -changes in vision -chest pain -high blood pressure -low blood counts - This drug may decrease the number of white blood cells, red blood cells and platelets. You may be at increased risk for infections and bleeding. -nausea and vomiting -pain, swelling, redness or irritation at the injection site -pain, tingling, numbness in the hands or feet -problems with balance, talking, walking -trouble passing urine or change in the amount of urine Side effects that usually do not require medical attention (report to your doctor or health care professional if they continue or are bothersome): -hair loss -loss of appetite -metallic taste in the mouth or changes in taste This list may not describe all   possible side effects. Call your doctor for medical advice about side effects. You may report side effects to FDA at 1-800-FDA-1088. Where should I keep my medicine? This drug is given in a hospital or clinic and will not be stored at home. NOTE: This sheet is a summary. It may not cover all possible information. If you  have questions about this medicine, talk to your doctor, pharmacist, or health care provider.  2015, Elsevier/Gold Standard. (2007-10-06 14:38:05)  

## 2014-05-06 ENCOUNTER — Ambulatory Visit
Admission: RE | Admit: 2014-05-06 | Discharge: 2014-05-06 | Disposition: A | Discharge status: Home / Self Care | Payer: Medicare HMO | Source: Ambulatory Visit | Attending: Radiation Oncology | Admitting: Radiation Oncology

## 2014-05-06 ENCOUNTER — Ambulatory Visit: Payer: Medicare HMO

## 2014-05-06 DIAGNOSIS — Z510 Encounter for antineoplastic radiation therapy: Secondary | ICD-10-CM | POA: Diagnosis not present

## 2014-05-06 LAB — FERRITIN CHCC

## 2014-05-06 LAB — IRON AND TIBC CHCC
%SAT: 38 % (ref 20–55)
IRON: 61 ug/dL (ref 42–163)
TIBC: 161 ug/dL — AB (ref 202–409)
UIBC: 100 ug/dL — ABNORMAL LOW (ref 117–376)

## 2014-05-06 LAB — PREALBUMIN: PREALBUMIN: 20.8 mg/dL (ref 17.0–34.0)

## 2014-05-07 ENCOUNTER — Other Ambulatory Visit: Payer: Self-pay

## 2014-05-07 ENCOUNTER — Inpatient Hospital Stay (HOSPITAL_COMMUNITY)
Admission: EM | Admit: 2014-05-07 | Discharge: 2014-05-17 | DRG: 391 | Disposition: A | Discharge status: Home / Self Care | Payer: Medicare HMO | Attending: Internal Medicine | Admitting: Internal Medicine

## 2014-05-07 ENCOUNTER — Emergency Department (HOSPITAL_COMMUNITY): Payer: Medicare HMO

## 2014-05-07 ENCOUNTER — Inpatient Hospital Stay (HOSPITAL_COMMUNITY): Payer: Medicare HMO

## 2014-05-07 ENCOUNTER — Encounter (HOSPITAL_COMMUNITY): Payer: Self-pay | Admitting: Emergency Medicine

## 2014-05-07 DIAGNOSIS — I2510 Atherosclerotic heart disease of native coronary artery without angina pectoris: Secondary | ICD-10-CM | POA: Diagnosis present

## 2014-05-07 DIAGNOSIS — K219 Gastro-esophageal reflux disease without esophagitis: Secondary | ICD-10-CM

## 2014-05-07 DIAGNOSIS — T424X5A Adverse effect of benzodiazepines, initial encounter: Secondary | ICD-10-CM | POA: Diagnosis not present

## 2014-05-07 DIAGNOSIS — Z8249 Family history of ischemic heart disease and other diseases of the circulatory system: Secondary | ICD-10-CM

## 2014-05-07 DIAGNOSIS — I129 Hypertensive chronic kidney disease with stage 1 through stage 4 chronic kidney disease, or unspecified chronic kidney disease: Secondary | ICD-10-CM | POA: Diagnosis present

## 2014-05-07 DIAGNOSIS — Z794 Long term (current) use of insulin: Secondary | ICD-10-CM

## 2014-05-07 DIAGNOSIS — Z803 Family history of malignant neoplasm of breast: Secondary | ICD-10-CM

## 2014-05-07 DIAGNOSIS — M25551 Pain in right hip: Secondary | ICD-10-CM

## 2014-05-07 DIAGNOSIS — Z6827 Body mass index (BMI) 27.0-27.9, adult: Secondary | ICD-10-CM

## 2014-05-07 DIAGNOSIS — R339 Retention of urine, unspecified: Secondary | ICD-10-CM | POA: Diagnosis not present

## 2014-05-07 DIAGNOSIS — I951 Orthostatic hypotension: Secondary | ICD-10-CM | POA: Diagnosis present

## 2014-05-07 DIAGNOSIS — R42 Dizziness and giddiness: Secondary | ICD-10-CM

## 2014-05-07 DIAGNOSIS — Z79899 Other long term (current) drug therapy: Secondary | ICD-10-CM

## 2014-05-07 DIAGNOSIS — Z833 Family history of diabetes mellitus: Secondary | ICD-10-CM

## 2014-05-07 DIAGNOSIS — E113399 Type 2 diabetes mellitus with moderate nonproliferative diabetic retinopathy without macular edema, unspecified eye: Secondary | ICD-10-CM

## 2014-05-07 DIAGNOSIS — I739 Peripheral vascular disease, unspecified: Secondary | ICD-10-CM

## 2014-05-07 DIAGNOSIS — R413 Other amnesia: Secondary | ICD-10-CM

## 2014-05-07 DIAGNOSIS — J441 Chronic obstructive pulmonary disease with (acute) exacerbation: Secondary | ICD-10-CM | POA: Diagnosis present

## 2014-05-07 DIAGNOSIS — Y842 Radiological procedure and radiotherapy as the cause of abnormal reaction of the patient, or of later complication, without mention of misadventure at the time of the procedure: Secondary | ICD-10-CM | POA: Diagnosis present

## 2014-05-07 DIAGNOSIS — Z9861 Coronary angioplasty status: Secondary | ICD-10-CM | POA: Diagnosis not present

## 2014-05-07 DIAGNOSIS — K208 Other esophagitis: Secondary | ICD-10-CM | POA: Diagnosis present

## 2014-05-07 DIAGNOSIS — R000 Tachycardia, unspecified: Secondary | ICD-10-CM

## 2014-05-07 DIAGNOSIS — R531 Weakness: Secondary | ICD-10-CM

## 2014-05-07 DIAGNOSIS — J9601 Acute respiratory failure with hypoxia: Secondary | ICD-10-CM | POA: Diagnosis present

## 2014-05-07 DIAGNOSIS — D638 Anemia in other chronic diseases classified elsewhere: Secondary | ICD-10-CM | POA: Diagnosis present

## 2014-05-07 DIAGNOSIS — I4891 Unspecified atrial fibrillation: Secondary | ICD-10-CM

## 2014-05-07 DIAGNOSIS — I82621 Acute embolism and thrombosis of deep veins of right upper extremity: Secondary | ICD-10-CM | POA: Diagnosis present

## 2014-05-07 DIAGNOSIS — N183 Chronic kidney disease, stage 3 (moderate): Secondary | ICD-10-CM | POA: Diagnosis present

## 2014-05-07 DIAGNOSIS — R4781 Slurred speech: Secondary | ICD-10-CM | POA: Diagnosis present

## 2014-05-07 DIAGNOSIS — D61810 Antineoplastic chemotherapy induced pancytopenia: Secondary | ICD-10-CM | POA: Diagnosis present

## 2014-05-07 DIAGNOSIS — E119 Type 2 diabetes mellitus without complications: Secondary | ICD-10-CM | POA: Diagnosis present

## 2014-05-07 DIAGNOSIS — R911 Solitary pulmonary nodule: Secondary | ICD-10-CM

## 2014-05-07 DIAGNOSIS — G92 Toxic encephalopathy: Secondary | ICD-10-CM | POA: Diagnosis not present

## 2014-05-07 DIAGNOSIS — I4819 Other persistent atrial fibrillation: Secondary | ICD-10-CM

## 2014-05-07 DIAGNOSIS — D6959 Other secondary thrombocytopenia: Secondary | ICD-10-CM | POA: Diagnosis present

## 2014-05-07 DIAGNOSIS — I480 Paroxysmal atrial fibrillation: Secondary | ICD-10-CM | POA: Diagnosis present

## 2014-05-07 DIAGNOSIS — R0602 Shortness of breath: Secondary | ICD-10-CM

## 2014-05-07 DIAGNOSIS — Z7982 Long term (current) use of aspirin: Secondary | ICD-10-CM

## 2014-05-07 DIAGNOSIS — D6481 Anemia due to antineoplastic chemotherapy: Secondary | ICD-10-CM | POA: Diagnosis present

## 2014-05-07 DIAGNOSIS — E860 Dehydration: Secondary | ICD-10-CM | POA: Diagnosis present

## 2014-05-07 DIAGNOSIS — C801 Malignant (primary) neoplasm, unspecified: Secondary | ICD-10-CM

## 2014-05-07 DIAGNOSIS — E43 Unspecified severe protein-calorie malnutrition: Secondary | ICD-10-CM | POA: Diagnosis present

## 2014-05-07 DIAGNOSIS — C3401 Malignant neoplasm of right main bronchus: Secondary | ICD-10-CM

## 2014-05-07 DIAGNOSIS — I499 Cardiac arrhythmia, unspecified: Secondary | ICD-10-CM

## 2014-05-07 DIAGNOSIS — R7989 Other specified abnormal findings of blood chemistry: Secondary | ICD-10-CM

## 2014-05-07 DIAGNOSIS — T451X5A Adverse effect of antineoplastic and immunosuppressive drugs, initial encounter: Secondary | ICD-10-CM | POA: Diagnosis present

## 2014-05-07 DIAGNOSIS — F4321 Adjustment disorder with depressed mood: Secondary | ICD-10-CM

## 2014-05-07 DIAGNOSIS — E785 Hyperlipidemia, unspecified: Secondary | ICD-10-CM | POA: Diagnosis present

## 2014-05-07 DIAGNOSIS — Z806 Family history of leukemia: Secondary | ICD-10-CM

## 2014-05-07 DIAGNOSIS — Z87891 Personal history of nicotine dependence: Secondary | ICD-10-CM | POA: Diagnosis not present

## 2014-05-07 DIAGNOSIS — C3402 Malignant neoplasm of left main bronchus: Secondary | ICD-10-CM | POA: Diagnosis present

## 2014-05-07 DIAGNOSIS — C3490 Malignant neoplasm of unspecified part of unspecified bronchus or lung: Secondary | ICD-10-CM

## 2014-05-07 DIAGNOSIS — C3400 Malignant neoplasm of unspecified main bronchus: Secondary | ICD-10-CM | POA: Diagnosis present

## 2014-05-07 DIAGNOSIS — R0600 Dyspnea, unspecified: Secondary | ICD-10-CM

## 2014-05-07 DIAGNOSIS — E663 Overweight: Secondary | ICD-10-CM

## 2014-05-07 DIAGNOSIS — R918 Other nonspecific abnormal finding of lung field: Secondary | ICD-10-CM

## 2014-05-07 LAB — GLUCOSE, CAPILLARY: Glucose-Capillary: 102 mg/dL — ABNORMAL HIGH (ref 70–99)

## 2014-05-07 LAB — CBC WITH DIFFERENTIAL/PLATELET
BASOS PCT: 0 % (ref 0–1)
Basophils Absolute: 0 10*3/uL (ref 0.0–0.1)
EOS ABS: 0 10*3/uL (ref 0.0–0.7)
EOS PCT: 0 % (ref 0–5)
HEMATOCRIT: 36.2 % — AB (ref 39.0–52.0)
Hemoglobin: 11.5 g/dL — ABNORMAL LOW (ref 13.0–17.0)
Lymphocytes Relative: 5 % — ABNORMAL LOW (ref 12–46)
Lymphs Abs: 0.3 10*3/uL — ABNORMAL LOW (ref 0.7–4.0)
MCH: 30.7 pg (ref 26.0–34.0)
MCHC: 31.8 g/dL (ref 30.0–36.0)
MCV: 96.8 fL (ref 78.0–100.0)
MONO ABS: 0.2 10*3/uL (ref 0.1–1.0)
MONOS PCT: 4 % (ref 3–12)
NEUTROS ABS: 4.4 10*3/uL (ref 1.7–7.7)
Neutrophils Relative %: 91 % — ABNORMAL HIGH (ref 43–77)
Platelets: 140 10*3/uL — ABNORMAL LOW (ref 150–400)
RBC: 3.74 MIL/uL — ABNORMAL LOW (ref 4.22–5.81)
RDW: 19.4 % — ABNORMAL HIGH (ref 11.5–15.5)
WBC: 4.8 10*3/uL (ref 4.0–10.5)

## 2014-05-07 LAB — COMPREHENSIVE METABOLIC PANEL
ALBUMIN: 2.6 g/dL — AB (ref 3.5–5.2)
ALK PHOS: 101 U/L (ref 39–117)
ALT: 13 U/L (ref 0–53)
AST: 17 U/L (ref 0–37)
Anion gap: 13 (ref 5–15)
BILIRUBIN TOTAL: 0.5 mg/dL (ref 0.3–1.2)
BUN: 16 mg/dL (ref 6–23)
CHLORIDE: 100 meq/L (ref 96–112)
CO2: 25 mEq/L (ref 19–32)
Calcium: 8.5 mg/dL (ref 8.4–10.5)
Creatinine, Ser: 1.77 mg/dL — ABNORMAL HIGH (ref 0.50–1.35)
GFR calc Af Amer: 40 mL/min — ABNORMAL LOW (ref >90)
GFR calc non Af Amer: 35 mL/min — ABNORMAL LOW (ref >90)
Glucose, Bld: 125 mg/dL — ABNORMAL HIGH (ref 70–99)
POTASSIUM: 4.4 meq/L (ref 3.7–5.3)
Sodium: 138 mEq/L (ref 137–147)
Total Protein: 6.1 g/dL (ref 6.0–8.3)

## 2014-05-07 LAB — TSH: TSH: 1.36 u[IU]/mL (ref 0.350–4.500)

## 2014-05-07 LAB — I-STAT TROPONIN, ED: Troponin i, poc: 0.03 ng/mL (ref 0.00–0.08)

## 2014-05-07 LAB — TROPONIN I

## 2014-05-07 LAB — D-DIMER, QUANTITATIVE: D-Dimer, Quant: 7.47 ug/mL-FEU — ABNORMAL HIGH (ref 0.00–0.48)

## 2014-05-07 LAB — HEMOGLOBIN A1C
HEMOGLOBIN A1C: 7.6 % — AB (ref <5.7)
MEAN PLASMA GLUCOSE: 171 mg/dL — AB (ref <117)

## 2014-05-07 LAB — LACTIC ACID, PLASMA: LACTIC ACID, VENOUS: 1.6 mmol/L (ref 0.5–2.2)

## 2014-05-07 MED ORDER — INSULIN ASPART 100 UNIT/ML ~~LOC~~ SOLN: 0.0000 [IU] | Freq: Three times a day (TID) | SUBCUTANEOUS | Status: DC

## 2014-05-07 MED ORDER — INSULIN ASPART 100 UNIT/ML ~~LOC~~ SOLN: 0.0000 [IU] | Freq: Every day | SUBCUTANEOUS | Status: DC

## 2014-05-07 MED ORDER — STROKE: EARLY STAGES OF RECOVERY BOOK
Freq: Once | Status: AC
  Administered 2014-05-07: 16:00:00
  Filled 2014-05-07: qty 1

## 2014-05-07 MED ORDER — ALBUTEROL SULFATE (2.5 MG/3ML) 0.083% IN NEBU: 2.5000 mg | INHALATION_SOLUTION | RESPIRATORY_TRACT | Status: DC | PRN

## 2014-05-07 MED ORDER — MOMETASONE FURO-FORMOTEROL FUM 200-5 MCG/ACT IN AERO
2.0000 | INHALATION_SPRAY | Freq: Two times a day (BID) | RESPIRATORY_TRACT | Status: DC
  Administered 2014-05-07 – 2014-05-17 (×20): 2 via RESPIRATORY_TRACT
  Filled 2014-05-07: qty 8.8

## 2014-05-07 MED ORDER — ENOXAPARIN SODIUM 40 MG/0.4ML ~~LOC~~ SOLN
40.0000 mg | SUBCUTANEOUS | Status: DC
  Administered 2014-05-07 – 2014-05-09 (×3): 40 mg via SUBCUTANEOUS
  Filled 2014-05-07 (×4): qty 0.4

## 2014-05-07 MED ORDER — SUCRALFATE 1 GM/10ML PO SUSP
1.0000 g | Freq: Three times a day (TID) | ORAL | Status: DC
  Administered 2014-05-07: 1 g via ORAL
  Filled 2014-05-07 (×3): qty 10

## 2014-05-07 MED ORDER — SODIUM CHLORIDE 0.9 % IV SOLN
INTRAVENOUS | Status: DC
  Administered 2014-05-07: 16:00:00 via INTRAVENOUS

## 2014-05-07 MED ORDER — LACTULOSE 10 GM/15ML PO SOLN
20.0000 g | Freq: Two times a day (BID) | ORAL | Status: DC
  Filled 2014-05-07: qty 30

## 2014-05-07 MED ORDER — ACETAMINOPHEN 325 MG PO TABS
650.0000 mg | ORAL_TABLET | Freq: Four times a day (QID) | ORAL | Status: DC | PRN
  Filled 2014-05-07: qty 2

## 2014-05-07 MED ORDER — PROCHLORPERAZINE MALEATE 10 MG PO TABS
10.0000 mg | ORAL_TABLET | Freq: Four times a day (QID) | ORAL | Status: DC | PRN
  Filled 2014-05-07: qty 1

## 2014-05-07 MED ORDER — PANTOPRAZOLE SODIUM 40 MG PO TBEC: 40.0000 mg | DELAYED_RELEASE_TABLET | Freq: Two times a day (BID) | ORAL | Status: DC

## 2014-05-07 MED ORDER — HYDROMORPHONE HCL 1 MG/ML IJ SOLN: 0.5000 mg | INTRAMUSCULAR | Status: DC | PRN

## 2014-05-07 MED ORDER — ACETAMINOPHEN 650 MG RE SUPP: 650.0000 mg | Freq: Four times a day (QID) | RECTAL | Status: DC | PRN

## 2014-05-07 MED ORDER — LORAZEPAM 2 MG/ML IJ SOLN: 0.5000 mg | INTRAMUSCULAR | Status: DC | PRN

## 2014-05-07 MED ORDER — SODIUM CHLORIDE 0.9 % IJ SOLN
3.0000 mL | Freq: Two times a day (BID) | INTRAMUSCULAR | Status: DC
  Administered 2014-05-07 – 2014-05-16 (×5): 3 mL via INTRAVENOUS

## 2014-05-07 MED ORDER — DEXTROSE-NACL 5-0.9 % IV SOLN
INTRAVENOUS | Status: DC
Start: 2014-05-07 — End: 2014-05-09
  Administered 2014-05-07 – 2014-05-08 (×3): via INTRAVENOUS

## 2014-05-07 MED ORDER — DOCUSATE SODIUM 100 MG PO CAPS
100.0000 mg | ORAL_CAPSULE | Freq: Two times a day (BID) | ORAL | Status: DC
  Filled 2014-05-07: qty 1

## 2014-05-07 MED ORDER — ASPIRIN 300 MG RE SUPP
300.0000 mg | Freq: Every day | RECTAL | Status: DC
  Administered 2014-05-08: 300 mg via RECTAL
  Filled 2014-05-07 (×3): qty 1

## 2014-05-07 MED ORDER — SODIUM CHLORIDE 0.9 % IV SOLN: INTRAVENOUS | Status: DC

## 2014-05-07 MED ORDER — INSULIN ASPART 100 UNIT/ML ~~LOC~~ SOLN
0.0000 [IU] | Freq: Four times a day (QID) | SUBCUTANEOUS | Status: DC
  Administered 2014-05-08: 1 [IU] via SUBCUTANEOUS
  Administered 2014-05-08: 2 [IU] via SUBCUTANEOUS
  Administered 2014-05-08: 1 [IU] via SUBCUTANEOUS
  Administered 2014-05-09: 2 [IU] via SUBCUTANEOUS
  Administered 2014-05-09 (×2): 3 [IU] via SUBCUTANEOUS

## 2014-05-07 MED ORDER — GLUCERNA SHAKE PO LIQD
237.0000 mL | Freq: Three times a day (TID) | ORAL | Status: DC
  Filled 2014-05-07: qty 237

## 2014-05-07 MED ORDER — LEVALBUTEROL HCL 1.25 MG/0.5ML IN NEBU
1.2500 mg | INHALATION_SOLUTION | RESPIRATORY_TRACT | Status: DC | PRN
  Filled 2014-05-07: qty 0.5

## 2014-05-07 MED ORDER — ASPIRIN 81 MG PO CHEW: 324.0000 mg | CHEWABLE_TABLET | Freq: Once | ORAL | Status: DC

## 2014-05-07 MED ORDER — INSULIN DETEMIR 100 UNIT/ML ~~LOC~~ SOLN
10.0000 [IU] | Freq: Every day | SUBCUTANEOUS | Status: DC
  Filled 2014-05-07: qty 0.1

## 2014-05-07 MED ORDER — HYDROCODONE-ACETAMINOPHEN 5-325 MG PO TABS: 1.0000 | ORAL_TABLET | ORAL | Status: DC | PRN

## 2014-05-07 MED ORDER — IOHEXOL 350 MG/ML SOLN
80.0000 mL | Freq: Once | INTRAVENOUS | Status: AC | PRN
  Administered 2014-05-07: 80 mL via INTRAVENOUS

## 2014-05-07 MED ORDER — SENNA 8.6 MG PO TABS
1.0000 | ORAL_TABLET | Freq: Two times a day (BID) | ORAL | Status: DC
  Administered 2014-05-07: 8.6 mg via ORAL
  Filled 2014-05-07: qty 1

## 2014-05-07 MED ORDER — ATORVASTATIN CALCIUM 80 MG PO TABS
80.0000 mg | ORAL_TABLET | Freq: Every day | ORAL | Status: DC
  Filled 2014-05-07: qty 1

## 2014-05-07 MED ORDER — BISACODYL 10 MG RE SUPP: 10.0000 mg | Freq: Every day | RECTAL | Status: DC | PRN

## 2014-05-07 MED ORDER — ONDANSETRON HCL 4 MG/2ML IJ SOLN
4.0000 mg | Freq: Four times a day (QID) | INTRAMUSCULAR | Status: DC
  Administered 2014-05-08 (×4): 4 mg via INTRAVENOUS
  Filled 2014-05-07 (×5): qty 2

## 2014-05-07 MED ORDER — ONDANSETRON HCL 4 MG/2ML IJ SOLN
4.0000 mg | Freq: Three times a day (TID) | INTRAMUSCULAR | Status: DC | PRN
  Administered 2014-05-07: 4 mg via INTRAVENOUS
  Filled 2014-05-07: qty 2

## 2014-05-07 MED ORDER — INSULIN ASPART 100 UNIT/ML ~~LOC~~ SOLN: 0.0000 [IU] | Freq: Four times a day (QID) | SUBCUTANEOUS | Status: DC

## 2014-05-07 MED ORDER — METOPROLOL TARTRATE 25 MG PO TABS: 25.0000 mg | ORAL_TABLET | Freq: Two times a day (BID) | ORAL | Status: DC

## 2014-05-07 MED ORDER — CITALOPRAM HYDROBROMIDE 40 MG PO TABS
40.0000 mg | ORAL_TABLET | Freq: Every day | ORAL | Status: DC
  Administered 2014-05-07: 40 mg via ORAL
  Filled 2014-05-07: qty 1

## 2014-05-07 MED ORDER — FENTANYL 12 MCG/HR TD PT72
12.5000 ug | MEDICATED_PATCH | TRANSDERMAL | Status: DC
  Administered 2014-05-07 – 2014-05-10 (×2): 12.5 ug via TRANSDERMAL
  Filled 2014-05-07 (×2): qty 1

## 2014-05-07 MED ORDER — SODIUM CHLORIDE 0.9 % IJ SOLN
10.0000 mL | INTRAMUSCULAR | Status: DC | PRN
  Administered 2014-05-08 – 2014-05-14 (×9): 10 mL
  Administered 2014-05-14: 20 mL
  Administered 2014-05-15 – 2014-05-17 (×5): 10 mL
  Filled 2014-05-07 (×5): qty 40

## 2014-05-07 MED ORDER — SODIUM CHLORIDE 0.9 % IV BOLUS (SEPSIS)
500.0000 mL | Freq: Once | INTRAVENOUS | Status: AC
  Administered 2014-05-07: 500 mL via INTRAVENOUS

## 2014-05-07 MED ORDER — ONDANSETRON HCL 8 MG PO TABS
8.0000 mg | ORAL_TABLET | Freq: Three times a day (TID) | ORAL | Status: DC
  Administered 2014-05-07: 8 mg via ORAL
  Filled 2014-05-07 (×2): qty 1

## 2014-05-07 MED ORDER — METOPROLOL TARTRATE 1 MG/ML IV SOLN
5.0000 mg | Freq: Four times a day (QID) | INTRAVENOUS | Status: DC
  Administered 2014-05-07 – 2014-05-08 (×2): 5 mg via INTRAVENOUS
  Filled 2014-05-07 (×6): qty 5

## 2014-05-07 MED ORDER — PROCHLORPERAZINE EDISYLATE 5 MG/ML IJ SOLN: 10.0000 mg | Freq: Four times a day (QID) | INTRAMUSCULAR | Status: DC | PRN

## 2014-05-07 MED ORDER — PANTOPRAZOLE SODIUM 40 MG IV SOLR
40.0000 mg | Freq: Two times a day (BID) | INTRAVENOUS | Status: DC
  Administered 2014-05-07 – 2014-05-08 (×3): 40 mg via INTRAVENOUS
  Filled 2014-05-07 (×5): qty 40

## 2014-05-07 MED ORDER — POLYETHYLENE GLYCOL 3350 17 G PO PACK
17.0000 g | PACK | Freq: Every day | ORAL | Status: DC | PRN
  Filled 2014-05-07: qty 1

## 2014-05-07 MED ORDER — ASPIRIN EC 325 MG PO TBEC
325.0000 mg | DELAYED_RELEASE_TABLET | Freq: Every day | ORAL | Status: DC
  Filled 2014-05-07: qty 1

## 2014-05-07 NOTE — Consult Note (Addendum)
Consulting cardiologist: Dr Carlyle Dolly MD  Clinical Summary Mr. Wintle is a 80 y.o.male hx of squamous cell lung CA undergoing chemo and XRT, COPD, GERD, HTN, HL, CAD with previous stent, DM2 admitted with weakness and lightheadedness. Has recently had fairly poor oral intake due to chemotherapy, not able to keep much fluid down.   Feels very lightheaded/dizzy with standing. Found to be severely orthostatic in ER, with approx 20 point drop in SBP with standing. Notes occasional palpitations. No chest pain, no SOB.   K 4.4, Cr 1.77, trop neg x2, TSH pending, Hgb 12.2, Plt 156 Echo 02/2014 LVEF 50-93%, grade I diastolic dysfunction CXR no acute process EKG Sinus tach, RBBB, LAFB (bifascicular block, borderline LAE, QTc by by measure 450 ms.  Cath 2003: LM patent, LAD 80% proximal, D1 80-90%, D2 95%, LCX prox 30-40%, 40-50% mid, RCA 30-40%. LVEF 50-55%. CABG was discussed at that time but patient opted for PCI. He received stent to prox LAD and mid/distal LCX.     Allergies  Allergen Reactions  . Exenatide     REACTION: nausea    Medications Scheduled Medications: . aspirin  300 mg Rectal Daily  . enoxaparin (LOVENOX) injection  40 mg Subcutaneous Q24H  . fentaNYL  12.5 mcg Transdermal Q72H  . insulin aspart  0-9 Units Subcutaneous Q6H  . mometasone-formoterol  2 puff Inhalation BID  . ondansetron (ZOFRAN) IV  4 mg Intravenous 4 times per day  . pantoprazole (PROTONIX) IV  40 mg Intravenous BID  . sodium chloride  3 mL Intravenous Q12H     Infusions: . dextrose 5 % and 0.9% NaCl       PRN Medications:  acetaminophen, acetaminophen, albuterol, bisacodyl, HYDROmorphone (DILAUDID) injection, prochlorperazine   Past Medical History  Diagnosis Date  . Hypertension   . COPD (chronic obstructive pulmonary disease)   . GERD (gastroesophageal reflux disease)   . Renal insufficiency   . Hyperlipidemia   . CAD (coronary artery disease)   . B12 deficiency   . Memory loss    . Vertigo   . Diabetes mellitus     type II, poly neuropathy  . Overweight 01/31/2013  . Lung cancer, hilus 03/22/2014    Past Surgical History  Procedure Laterality Date  . Coronary angioplasty with stent placement      more than 10 years ago  . Inguinal hernia repair  2010  . Video bronchoscopy Bilateral 03/07/2014    Procedure: VIDEO BRONCHOSCOPY WITHOUT FLUORO;  Surgeon: Kathee Delton, MD;  Location: [facility];  Service: Cardiopulmonary;  Laterality: Bilateral;    Family History  Problem Relation Age of Onset  . Coronary artery disease Mother   . Breast cancer Mother   . Diabetes Mother   . Leukemia Brother     Social History Mr. Granja reports that he quit smoking about 11 years ago. His smoking use included Cigarettes. He started smoking about 31 years ago. He has a 20 pack-year smoking history. He has never used smokeless tobacco. Mr. Roza reports that he does not drink alcohol.  Review of Systems CONSTITUTIONAL: No weight loss, fever, chills, weakness or fatigue.  HEENT: Eyes: No visual loss, blurred vision, double vision or yellow sclerae. No hearing loss, sneezing, congestion, runny nose or sore throat.  SKIN: No rash or itching.  CARDIOVASCULAR: occas palpitations  RESPIRATORY: No shortness of breath, cough or sputum.  GASTROINTESTINAL: No anorexia, nausea, vomiting or diarrhea. No abdominal pain or blood.  GENITOURINARY: no polyuria, no dysuria NEUROLOGICAL:  per HPI MUSCULOSKELETAL: No muscle, back pain, joint pain or stiffness.  HEMATOLOGIC: No anemia, bleeding or bruising.  LYMPHATICS: No enlarged nodes. No history of splenectomy.  PSYCHIATRIC: No history of depression or anxiety.      Physical Examination Blood pressure 141/74, pulse 86, temperature 98.4 F (36.9 C), temperature source Oral, resp. rate 16, height 5\' 8"  (1.727 m), weight 179 lb 0.2 oz (81.2 kg), SpO2 99.00%.  Intake/Output Summary (Last 24 hours) at 05/07/14 1739 Last data filed at  05/07/14 1612  Gross per 24 hour  Intake      0 ml  Output    525 ml  Net   -525 ml    HEENT: sclera clear  Cardiovascular: RRR, no m/r/g, no JVD, no carotid bruits  Respiratory: CTAB  GI: abdomen soft, NT, ND  MSK: no LE edema  Neuro: no focal deficits  Psych: appropriate affect   Lab Results  Basic Metabolic Panel:  Recent Labs Lab 05/05/14 1332 05/07/14 1026  NA 138 138  K 4.3 4.4  CL 98 100  CO2 27 25  GLUCOSE 145* 125*  BUN 9 16  CREATININE 1.4* 1.77*  CALCIUM 8.9 8.5    Liver Function Tests:  Recent Labs Lab 05/05/14 1332 05/07/14 1026  AST 19 17  ALT 15 13  ALKPHOS 95* 101  BILITOT 0.70 0.5  PROT 6.3* 6.1  ALBUMIN  --  2.6*    CBC:  Recent Labs Lab 05/05/14 1331 05/07/14 1026  WBC 3.8* 4.8  NEUTROABS 2.9 4.4  HGB 12.2* 11.5*  HCT 38.2* 36.2*  MCV 96 96.8  PLT 156 140*    Cardiac Enzymes:  Recent Labs Lab 05/07/14 1609  TROPONINI <0.30    BNP: No components found with this basename: POCBNP,    ECG   Imaging 10/2001 Cath RESULTS:  1. Left main coronary artery is widely patent and bifurcates into a  left anterior descending artery and left circumflex artery.  2. The left anterior descending artery has an 80%, very proximal,  almost into the ostium stenosis involving the first diagonal Omkar Stratmann  of 80-90%. Just distal to this narrowing there was a second diagonal which  has a 95% ostial stenosis. There are two further diagonal branches which  are patent. The rest of the LAD is widely patent throughout its course at  the apex.  3. The left circumflex gives rise to one small high obtuse marginal Mervyn Pflaum.  There are luminal irregularities up to 30-40% in the proximal and  midportion of the left circumflex. There is clearly a 40-50% narrowing  in the mid circumflex before the takeoff of a second obtuse marginal Vanassa Penniman  which is widely patent. The distal left circumflex has a 70% narrowing.  4. The right coronary artery is  widely patent throughout its course with  luminal irregularities up to 30-40%. It bifurcates distally in the  posterior descending artery, and posterolateral artery, both of which are  widely patent.  LEFT VENTRICULOGRAM: Left ventriculography performed in a 30-degree RAO view  using a total of 30 cc of contrast at 13 cc/sec. showed mild anterior  hypokinesis, mild MR. Ejection fraction 50-55%. LV pressure was 172/22 mmHg  and aortic pressure was 172/86 mmHg.  ASSESSMENT:  1 Two-vessel obstructive coronary disease.  2. Low-normal left ventricular function with anterior hypokinesis.  3. Hypertension with elevated blood pressure.  PLAN: Review of films with Dr. Tamala Julian. Probable CVTS consult. Start Toprol-XL  25 mg a day for blood pressure control. Continue aspirin  325 mg a day. Zocor  20 mg a day was started for hyperlipidemia.  Dictated by: Fransico Him, M.D.   Impression/Recommendations 1. Wide complex tachycardia - SVT with aberrancy, QRS complex identical to his baseline RBBB. SVT somewhat difficult to distinguish, it is regular at times but can have some irregularity as well.  - f/u echo, f/u TSH.  Will ad Mg level to AM labs.  - describes some occasional palpitations. His dizziness is primarily postional, and with his severe orthostatic changes in ER doubt arrhythmia playing much role. IVF per primary team, bp has come up to 141/74. Hopefully with fluid resusictation can tolerate low dose beta blocker, start lopressor 25mg  bid. If not cannot keep oral down can try IV lopressor 2.5mg  q 8 hours.   2. CAD - no current chest pain, trop neg and EKG without ischemic changes - continue risk factor modification. F/u echo.    Carlyle Dolly, M.D.

## 2014-05-07 NOTE — Progress Notes (Signed)
Dr. Oval Linsey on call for cardiology returned page. Informed her of patient with episodes of increased HR 150s intermittently. EKG shows Afib RVR with a rate of 103 at time of EKG. At this time patient appears sinus rhythm on the monitory with PACs and a rate of 79. Informed her patient received 5mg  lopressor IV prior to episodes of increased HR. Per Dr. Oval Linsey no new orders at this time, continue to monitor and call for any sustained elevated HR.

## 2014-05-07 NOTE — Progress Notes (Signed)
PHARMACY NOTE:  HOME CITALOPRAM DOSAGE. Orders noted to continue citalopram (Celexa) 40mg  daily as taken prior to admission.  Recent FDA Alert recommends limiting citalopram dosage to 20mg  daily in patients age 78 or older due to increased risk of QTc prolongation and life-threatening arrhythmias.    EKG on admission reported QTc as 495  RECOMMEND: Consider risk vs. benefit of continuing present citalopram dosage.   Thank you -  Clayburn Pert, PharmD, BCPS   [phone]

## 2014-05-07 NOTE — ED Provider Notes (Signed)
CSN: [id number]     Arrival date & time 05/07/14  5093 History   First MD Initiated Contact with Patient 05/07/14 1000     Chief Complaint  Patient presents with  . Weakness  . Dizziness      HPI Per EMS pt comes from home c/o generalized weakness and dizziness that started at 0215 this am. Pt has lung cancer and did receive chemo yesterday. Pt states to EMS that he hasnt had as much fluids and has orthostatic changes. Pt denies pain, n/v.  Patient had similar episode when he went to the oncologist yesterday.  He apparently was given IV fluids during that time.  Past Medical History  Diagnosis Date  . Hypertension   . COPD (chronic obstructive pulmonary disease)   . GERD (gastroesophageal reflux disease)   . Renal insufficiency   . Hyperlipidemia   . CAD (coronary artery disease)   . B12 deficiency   . Memory loss   . Vertigo   . Diabetes mellitus     type II, poly neuropathy  . Overweight 01/31/2013  . Lung cancer, hilus 03/22/2014   Past Surgical History  Procedure Laterality Date  . Coronary angioplasty with stent placement      more than 10 years ago  . Inguinal hernia repair  2010  . Video bronchoscopy Bilateral 03/07/2014    Procedure: VIDEO BRONCHOSCOPY WITHOUT FLUORO;  Surgeon: Kathee Delton, MD;  Location: [facility];  Service: Cardiopulmonary;  Laterality: Bilateral;   Family History  Problem Relation Age of Onset  . Coronary artery disease Mother   . Breast cancer Mother   . Diabetes Mother   . Leukemia Brother    History  Substance Use Topics  . Smoking status: Former Smoker -- 1.00 packs/day for 20 years    Types: Cigarettes    Start date: 09/09/1982    Quit date: 07/15/2002  . Smokeless tobacco: Never Used     Comment: quit smoking 11 years ago  . Alcohol Use: No    Review of Systems  Patient denies vomiting or diarrhea.  According to family he has poor by mouth intake. All other systems reviewed and are negative  Allergies  Exenatide  Home  Medications   Prior to Admission medications   Medication Sig Start Date End Date Taking? Authorizing Provider  albuterol (PROVENTIL HFA;VENTOLIN HFA) 108 (90 BASE) MCG/ACT inhaler Inhale 2 puffs into the lungs every 6 (six) hours as needed for wheezing or shortness of breath.   Yes Historical Provider, MD  atorvastatin (LIPITOR) 80 MG tablet TAKE ONE TABLET BY MOUTH ONE TIME DAILY  04/08/14  Yes Mosie Lukes, MD  budesonide-formoterol [facility]) 160-4.5 MCG/ACT inhaler Inhale 2 puffs into the lungs 2 (two) times daily.   Yes Historical Provider, MD  citalopram (CELEXA) 20 MG tablet Take 40 mg by mouth daily.   Yes Historical Provider, MD  fentaNYL (DURAGESIC - DOSED MCG/HR) 12 MCG/HR Place 1 patch (12.5 mcg total) onto the skin every 3 (three) days. 04/28/14  Yes Volanda Napoleon, MD  insulin detemir (LEVEMIR) 100 UNIT/ML injection Inject 50 Units into the skin daily. May increase by 2 units every 3 days as directed   Yes Historical Provider, MD  loperamide (IMODIUM) 1 MG/5ML solution Take 10 mLs (2 mg total) by mouth as needed for diarrhea or loose stools. 04/11/14  Yes Volanda Napoleon, MD  mometasone-formoterol (DULERA) 200-5 MCG/ACT AERO Inhale 2 puffs into the lungs 2 (two) times daily.   Yes Historical  Provider, MD  ondansetron (ZOFRAN) 8 MG tablet Take 8 mg by mouth every 8 (eight) hours as needed for nausea or vomiting. Start after Chemo   Yes Historical Provider, MD  pantoprazole (PROTONIX) 40 MG tablet Take 40 mg by mouth 2 (two) times daily.    Yes Historical Provider, MD  prochlorperazine (COMPAZINE) 10 MG tablet Take 10 mg by mouth every 6 (six) hours as needed for nausea or vomiting.   Yes Historical Provider, MD  sucralfate (CARAFATE) 1 GM/10ML suspension Take 10 mLs (1 g total) by mouth 4 (four) times daily -  with meals and at bedtime. 04/05/14  Yes Blair Promise, MD  Wound Cleansers (RADIAPLEX EX) Apply topically.   Yes Historical Provider, MD  lactulose (CHRONULAC) 10 GM/15ML  solution Take 30 mLs (20 g total) by mouth 2 (two) times daily. 04/28/14   Volanda Napoleon, MD   BP 141/74  Pulse 86  Temp(Src) 98.4 F (36.9 C) (Oral)  Resp 16  Ht 5\' 8"  (1.727 m)  Wt 179 lb 0.2 oz (81.2 kg)  BMI 27.23 kg/m2  SpO2 99% Physical Exam Physical Exam  Nursing note and vitals reviewed. Constitutional: He is oriented to person, place, and time. He appears well-developed and well-nourished. No distress.  patient becomes dizzy when he stands. HENT:  Head: Normocephalic and atraumatic.  Eyes: Pupils are equal, round, and reactive to light.  Neck: Normal range of motion.  Cardiovascular: Normal rate and intact distal pulses.   Pulmonary/Chest: No respiratory distress.  Abdominal: Normal appearance. He exhibits no distension.  Musculoskeletal: Normal range of motion.  Neurological: He is alert and oriented to person, place, and time. No cranial nerve deficit. No speech problems. No lateralizing weakness or physical findings suggesting acute stroke.  Skin: Skin is warm and dry. No rash noted.  Psychiatric: He has a normal mood and affect. His behavior is normal.   ED Course  Procedures (including critical care time) Labs Review Labs Reviewed  COMPREHENSIVE METABOLIC PANEL - Abnormal; Notable for the following:    Glucose, Bld 125 (*)    Creatinine, Ser 1.77 (*)    Albumin 2.6 (*)    GFR calc non Af Amer 35 (*)    GFR calc Af Amer 40 (*)    All other components within normal limits  CBC WITH DIFFERENTIAL - Abnormal; Notable for the following:    RBC 3.74 (*)    Hemoglobin 11.5 (*)    HCT 36.2 (*)    RDW 19.4 (*)    Platelets 140 (*)    Neutrophils Relative % 91 (*)    Lymphocytes Relative 5 (*)    Lymphs Abs 0.3 (*)    All other components within normal limits  D-DIMER, QUANTITATIVE - Abnormal; Notable for the following:    D-Dimer, Quant 7.47 (*)    All other components within normal limits  GLUCOSE, CAPILLARY - Abnormal; Notable for the following:     Glucose-Capillary 102 (*)    All other components within normal limits  LACTIC ACID, PLASMA  TSH  TROPONIN I  HEMOGLOBIN A1C  TROPONIN I  TROPONIN I  LIPID PANEL  BASIC METABOLIC PANEL  CBC  MAGNESIUM  I-STAT TROPOININ, ED    Imaging Review Dg Chest Port 1 View  05/07/2014   CLINICAL DATA:  Patient complaining of shortness of breath today. History of COPD. History of lung carcinoma, coronary artery disease and hypertension. Ex-smoker.  EXAM: PORTABLE CHEST - 1 VIEW  COMPARISON:  10/07/2012.  FINDINGS:  Cardiac silhouette is mildly enlarged. No mediastinal or hilar masses.  No lung consolidation or edema.  No mass or discrete nodule.  Opacity superimpose are cardiac silhouette may be due to the aorta or a small hiatal hernia.  No convincing pleural effusion.  No pneumothorax.  Right anterior chest wall power Port-A-Cath has its tip in the lower superior vena cava.  IMPRESSION: No acute cardiopulmonary disease.   Electronically Signed   By: Lajean Manes M.D.   On: 05/07/2014 11:11    2nd EKG showed sinus rhythm.  No acute ST changes noted.  Old RBBB present.  Short run of afib with RVR noted on rhythm strip.  Plan admit with rehydration and monitoring.    MDM   Final diagnoses:  Lung cancer  Dizziness  Dehydration  Cardiac arrhythmia, unspecified cardiac arrhythmia type        Dot Lanes, MD 05/09/14 2138

## 2014-05-07 NOTE — ED Notes (Signed)
Per EMS pt comes from home c/o generalized weakness and dizziness that started at 0215 this am. Pt has lung cancer and did receive chemo yesterday. Pt states to EMS that he hasnt had as much fluids and has orthostatic changes. Pt denies pain, n/v.

## 2014-05-07 NOTE — ED Notes (Signed)
Bed: WA17 Expected date:  Expected time:  Means of arrival:  Comments: Hold- cancer pt

## 2014-05-07 NOTE — H&P (Addendum)
[facility] History and Physical  PAYDEN DOCTER TIW:[id number] DOB: November 05, 1932 DOA: 05/07/2014  Referring physician:  Leonard Schwartz PCP:  [name], MD  ONC:  Dr. Marin Olp  Chief Complaint:  Generalized weakness, lightheadedness  HPI:  The patient is a 80 y.o. year-old male with history of stage IIIB squamous cell lung cancer for which he has been undergoing chemotherapy and XRT, COPD, GERD, HTN/HLD, CAD s/p stent 10 years ago, T2DM who presents with generalized weakness and lightheadedness.  The patient was last at their baseline health several weeks ago.  Because he had been having severe abdominal pain, he was given a break from his chemotherapy for 1 week which helped. He resumes his chemotherapy last week. The patient and his family report that the chemotherapy causes nausea and decreased appetite. Although he has been trying to drink more fluids, he is only been able to keep down approximately 4-6 ounces of fluid per day for the last several days. He has been compliant with his antiemetics. He has had some progressive weakness over the last few weeks, but this morning around 2 AM he was so weak he was unable to stand up. When he sits up or stands up, he becomes very lightheaded or dizzy.  He denies recent fevers, chills, new signs of infection. He has chronic rhinorrhea, chronic cough. He denies any increased shortness of breath, dysuria. He is constipated. He states approximately 2 weeks ago, he had a change in the hearing of his right ear. He can hear his heartbeat in his right ear and he has noticed that his heartbeat is irregular intermittently. He denies any chest pain, chest pressure, association with shortness of breath, nausea, lightheadedness. Since this morning, he has felt that his speech has been slurred, his thinking has been clouded. He is worried he had a stroke.  Denies focal numbness, weakness.   In the emergency department, vital signs were notable for oxygen saturation of  88% on room air.  His orthostatics were very positive with a drop in his systolic blood pressure from 25-05 systolic when standing up. Labs were near baseline except for mild elevation of creatinine to 1.77, baseline of 1.4.He is not on home oxygen. His initial EKG was normal sinus rhythm with RBBB, no evidence of ischemia, however, on telemetry, he is having episodic wide complex tachycardia that appears somewhat irregular but concerning in that he is > 200bpm during some of these episodes.  He is generally asymptomatic during these episodes.   Urinalysis was unremarkable a chest x-ray was negative for infiltrate. Troponin was negative.   Review of Systems:  General:  Denies fevers, chills, + weight loss HEENT:  Per HPI CV:  Denies chest pain, lower extremity edema.  PULM:  Denies SOB, wheezing, chronic cough.   GI:  Per HPI GU:  Denies dysuria, frequency, urgency.  Has difficulty getting stream started ENDO:  Denies polyuria, polydipsia.   HEME:  Denies hematemesis, blood in stools, melena, abnormal bruising or bleeding.  LYMPH:  Denies lymphadenopathy.   MSK:  Denies arthralgias, myalgias.   DERM:  Denies skin rash or ulcer.   NEURO:  Denies focal numbness, weakness, slurred speech, confusion, facial droop.  PSYCH:  Denies anxiety and depression.    Past Medical History  Diagnosis Date  . Hypertension   . COPD (chronic obstructive pulmonary disease)   . GERD (gastroesophageal reflux disease)   . Renal insufficiency   . Hyperlipidemia   . CAD (coronary artery disease)   . B12  deficiency   . Memory loss   . Vertigo   . Diabetes mellitus     type II, poly neuropathy  . Overweight 01/31/2013  . Lung cancer, hilus 03/22/2014   Past Surgical History  Procedure Laterality Date  . Coronary angioplasty with stent placement      more than 10 years ago  . Inguinal hernia repair  2010  . Video bronchoscopy Bilateral 03/07/2014    Procedure: VIDEO BRONCHOSCOPY WITHOUT FLUORO;  Surgeon:  Kathee Delton, MD;  Location: [facility];  Service: Cardiopulmonary;  Laterality: Bilateral;   Social History:  reports that he quit smoking about 11 years ago. His smoking use included Cigarettes. He started smoking about 31 years ago. He has a 20 pack-year smoking history. He has never used smokeless tobacco. He reports that he does not drink alcohol. His drug history is not on file.  Allergies  Allergen Reactions  . Exenatide     REACTION: nausea    Family History  Problem Relation Age of Onset  . Coronary artery disease Mother   . Breast cancer Mother   . Diabetes Mother   . Leukemia Brother      Prior to Admission medications   Medication Sig Start Date End Date Taking? Authorizing Provider  albuterol (PROVENTIL HFA;VENTOLIN HFA) 108 (90 BASE) MCG/ACT inhaler Inhale 2 puffs into the lungs every 6 (six) hours as needed for wheezing or shortness of breath.   Yes Historical Provider, MD  atorvastatin (LIPITOR) 80 MG tablet TAKE ONE TABLET BY MOUTH ONE TIME DAILY  04/08/14  Yes Mosie Lukes, MD  budesonide-formoterol [facility]) 160-4.5 MCG/ACT inhaler Inhale 2 puffs into the lungs 2 (two) times daily.   Yes Historical Provider, MD  citalopram (CELEXA) 20 MG tablet Take 40 mg by mouth daily.   Yes Historical Provider, MD  fentaNYL (DURAGESIC - DOSED MCG/HR) 12 MCG/HR Place 1 patch (12.5 mcg total) onto the skin every 3 (three) days. 04/28/14  Yes Volanda Napoleon, MD  insulin detemir (LEVEMIR) 100 UNIT/ML injection Inject 50 Units into the skin daily. May increase by 2 units every 3 days as directed   Yes Historical Provider, MD  loperamide (IMODIUM) 1 MG/5ML solution Take 10 mLs (2 mg total) by mouth as needed for diarrhea or loose stools. 04/11/14  Yes Volanda Napoleon, MD  mometasone-formoterol (DULERA) 200-5 MCG/ACT AERO Inhale 2 puffs into the lungs 2 (two) times daily.   Yes Historical Provider, MD  ondansetron (ZOFRAN) 8 MG tablet Take 8 mg by mouth every 8 (eight) hours as  needed for nausea or vomiting. Start after Chemo   Yes Historical Provider, MD  pantoprazole (PROTONIX) 40 MG tablet Take 40 mg by mouth 2 (two) times daily.    Yes Historical Provider, MD  prochlorperazine (COMPAZINE) 10 MG tablet Take 10 mg by mouth every 6 (six) hours as needed for nausea or vomiting.   Yes Historical Provider, MD  sucralfate (CARAFATE) 1 GM/10ML suspension Take 10 mLs (1 g total) by mouth 4 (four) times daily -  with meals and at bedtime. 04/05/14  Yes Blair Promise, MD  Wound Cleansers (RADIAPLEX EX) Apply topically.   Yes Historical Provider, MD  lactulose (CHRONULAC) 10 GM/15ML solution Take 30 mLs (20 g total) by mouth 2 (two) times daily. 04/28/14   Volanda Napoleon, MD   Physical Exam: Filed Vitals:   05/07/14 1430 05/07/14 1446 05/07/14 1447 05/07/14 1605  BP: 141/74  141/74   Pulse: 89  93 86  Temp:  98.2 F (36.8 C)  98.4 F (36.9 C)  TempSrc:    Oral  Resp: 34  16 16  Height:    5\' 8"  (1.727 m)  Weight:    81.2 kg (179 lb 0.2 oz)  SpO2: 98%  99% 99%     General:  WM, NAD  Eyes:  PERRL, anicteric, non-injected.  ENT:  Nares clear.  OP clear, non-erythematous without plaques or exudates.  MMM.  Neck:  Supple without TM or JVD.    Lymph:  No cervical, supraclavicular, or submandibular LAD.  Cardiovascular:  RRR, normal S1, S2, without m/r/g.  2+ pulses, warm extremities  Respiratory:  CTA bilaterally without increased WOB.  Abdomen:  NABS.  Soft, ND/NT.    Skin:  No rashes or focal lesions.  Musculoskeletal:  Normal bulk and tone.  Trace RLE edema and 1 + LLE edema  Psychiatric:  A & O x 4.  Appropriate affect.  Neurologic:  CN II-XII grossly intact.  Speech deficit noticeable to family and patient, but not to myself.  No word finding difficulties.  Strength and sensation symmetric and no obvious dysmetria.    Labs on Admission:  Basic Metabolic Panel:  Recent Labs Lab 05/05/14 1332 05/07/14 1026  NA 138 138  K 4.3 4.4  CL 98 100   CO2 27 25  GLUCOSE 145* 125*  BUN 9 16  CREATININE 1.4* 1.77*  CALCIUM 8.9 8.5   Liver Function Tests:  Recent Labs Lab 05/05/14 1332 05/07/14 1026  AST 19 17  ALT 15 13  ALKPHOS 95* 101  BILITOT 0.70 0.5  PROT 6.3* 6.1  ALBUMIN  --  2.6*   No results found for this basename: LIPASE, AMYLASE,  in the last 168 hours No results found for this basename: AMMONIA,  in the last 168 hours CBC:  Recent Labs Lab 05/05/14 1331 05/07/14 1026  WBC 3.8* 4.8  NEUTROABS 2.9 4.4  HGB 12.2* 11.5*  HCT 38.2* 36.2*  MCV 96 96.8  PLT 156 140*   Cardiac Enzymes:  Recent Labs Lab 05/07/14 1609  TROPONINI <0.30    BNP (last 3 results) No results found for this basename: PROBNP,  in the last 8760 hours CBG:  Recent Labs Lab 05/07/14 1638  GLUCAP 102*    Radiological Exams on Admission: Dg Chest Port 1 View  05/07/2014   CLINICAL DATA:  Patient complaining of shortness of breath today. History of COPD. History of lung carcinoma, coronary artery disease and hypertension. Ex-smoker.  EXAM: PORTABLE CHEST - 1 VIEW  COMPARISON:  10/07/2012.  FINDINGS: Cardiac silhouette is mildly enlarged. No mediastinal or hilar masses.  No lung consolidation or edema.  No mass or discrete nodule.  Opacity superimpose are cardiac silhouette may be due to the aorta or a small hiatal hernia.  No convincing pleural effusion.  No pneumothorax.  Right anterior chest wall power Port-A-Cath has its tip in the lower superior vena cava.  IMPRESSION: No acute cardiopulmonary disease.   Electronically Signed   By: Lajean Manes M.D.   On: 05/07/2014 11:11    EKG: Independently reviewed. NSR with RBBB, no acute changes  Assessment/Plan Active Problems:   Diabetes mellitus, type 2   Coronary atherosclerosis   Lung cancer, hilus   Dehydration   Acute respiratory failure with hypoxia   Elevated d-dimer   Tachycardia   Generalized weakness   Orthostatic hypotension  ---  Dehydration and generalized  weakness with orthostatic hypotension -  IVF -  Continue antiemetics -  Consider appetite stimulant, supplements and liberalized diet once diet advanced -  Nutrition consultation -  PT/OT  Slurred speech with confusion.  Higher suspicion for stroke given possible new PAF.  FAILED STROKE SWALLOW -  Daily rectal ASA -  MRI brain and if positive, additional testing -  Speech therapy assessment -  Lipid panel and A1c  Possible a-fib with RVR/SVT with abberancy vs. Intermittent V-tach -  Telemetry -  Cycle troponins -  TSH -  ECHO -  Start low dose beta blocker if blood pressure tolerates.  Digoxin probably not a good choice given age and kidney function.   -  Hold on heparin gtt pending cardiology consultation -  Cardiology consultation, spoke with Dr. Wynonia Lawman  Acute hypoxic respiratory failure. DDx includes COPD exac.  D-dimer elevated. -  CT angio chest -  Resume dulera -  Xopenex prn -  CT angio chest  Lower extermity edema may be due to diastolic heart failure or DVT in the setting of malignancy -  Lower extremity duplex -  Check ECHO  Mild Acute on chronic kidney disease stage 3, likely prerenal given history -  IVF and repeat BMP in AM  Difficulty urinating -  Check PVR:  Retaining urine -  Place foley  Constipation, bisacodyl prn.   -  Soap suds enema if needed  CAD, chest pain free but having new arrhythmias -  Continue ASA -  Consider BB, defer to cardiology -  Resume statin but change to another pill form or crush if possible  T2DM, has not received insulin in last day and CBGs are low normal -  A1c -  Start dextrose fluids and cover with SSI  Squamous cell lung CA -  Oncologist and radiation oncologist added to rounding list.    Anemia of chronic disease and chemotherapy, hgb near baseline -  Trend hemoglobin   Thrombocytopenia likely due to chemotherapy -  Trend platelets  Diet:  diabetic Access:  port IVF:  yes Proph:  lovenox  Code Status:  FULL  Family Communication: patient and his daughter Disposition Plan: Admit to telemetry  Time spent: 60 min Janece Canterbury [facility] Pager [phone]  If 7PM-7AM, please contact night-coverage [email or web address] Password [email or web address] 05/07/2014, 5:49 PM

## 2014-05-07 NOTE — Progress Notes (Signed)
Patient having frequent episodes of SVT on monitor with rates 150s lasting approximately 30 seconds each. Rate appears irregular on monitor. EKG done showing Afibrillation with RVR. Cardiology paged, awaiting call back.

## 2014-05-07 NOTE — ED Notes (Signed)
Called floor to give report RN unavailable as well as Agricultural consultant.  Will call back again.

## 2014-05-08 ENCOUNTER — Other Ambulatory Visit: Payer: Self-pay

## 2014-05-08 ENCOUNTER — Inpatient Hospital Stay (HOSPITAL_COMMUNITY): Payer: Medicare HMO

## 2014-05-08 ENCOUNTER — Ambulatory Visit (HOSPITAL_COMMUNITY): Payer: Medicare HMO

## 2014-05-08 DIAGNOSIS — R42 Dizziness and giddiness: Secondary | ICD-10-CM

## 2014-05-08 DIAGNOSIS — I059 Rheumatic mitral valve disease, unspecified: Secondary | ICD-10-CM

## 2014-05-08 DIAGNOSIS — R600 Localized edema: Secondary | ICD-10-CM

## 2014-05-08 DIAGNOSIS — R4781 Slurred speech: Secondary | ICD-10-CM

## 2014-05-08 LAB — BASIC METABOLIC PANEL
ANION GAP: 9 (ref 5–15)
BUN: 17 mg/dL (ref 6–23)
CHLORIDE: 101 meq/L (ref 96–112)
CO2: 26 mEq/L (ref 19–32)
Calcium: 8 mg/dL — ABNORMAL LOW (ref 8.4–10.5)
Creatinine, Ser: 1.62 mg/dL — ABNORMAL HIGH (ref 0.50–1.35)
GFR calc non Af Amer: 39 mL/min — ABNORMAL LOW (ref >90)
GFR, EST AFRICAN AMERICAN: 45 mL/min — AB (ref >90)
Glucose, Bld: 164 mg/dL — ABNORMAL HIGH (ref 70–99)
Potassium: 4.9 mEq/L (ref 3.7–5.3)
SODIUM: 136 meq/L — AB (ref 137–147)

## 2014-05-08 LAB — CBC
HEMATOCRIT: 31.2 % — AB (ref 39.0–52.0)
HEMOGLOBIN: 9.8 g/dL — AB (ref 13.0–17.0)
MCH: 30.2 pg (ref 26.0–34.0)
MCHC: 31.4 g/dL (ref 30.0–36.0)
MCV: 96.3 fL (ref 78.0–100.0)
Platelets: 127 10*3/uL — ABNORMAL LOW (ref 150–400)
RBC: 3.24 MIL/uL — ABNORMAL LOW (ref 4.22–5.81)
RDW: 19.4 % — ABNORMAL HIGH (ref 11.5–15.5)
WBC: 2.8 10*3/uL — AB (ref 4.0–10.5)

## 2014-05-08 LAB — LIPID PANEL
Cholesterol: 172 mg/dL (ref 0–200)
HDL: 30 mg/dL — ABNORMAL LOW (ref >39)
LDL CALC: 99 mg/dL (ref 0–99)
TRIGLYCERIDES: 214 mg/dL — AB (ref <150)
Total CHOL/HDL Ratio: 5.7 RATIO
VLDL: 43 mg/dL — ABNORMAL HIGH (ref 0–40)

## 2014-05-08 LAB — GLUCOSE, CAPILLARY
GLUCOSE-CAPILLARY: 115 mg/dL — AB (ref 70–99)
GLUCOSE-CAPILLARY: 141 mg/dL — AB (ref 70–99)
GLUCOSE-CAPILLARY: 171 mg/dL — AB (ref 70–99)
Glucose-Capillary: 149 mg/dL — ABNORMAL HIGH (ref 70–99)
Glucose-Capillary: 209 mg/dL — ABNORMAL HIGH (ref 70–99)

## 2014-05-08 LAB — TROPONIN I: Troponin I: <0.3 ng/mL (ref <0.30)

## 2014-05-08 LAB — MAGNESIUM: MAGNESIUM: 2.1 mg/dL (ref 1.5–2.5)

## 2014-05-08 MED ORDER — AMIODARONE HCL IN DEXTROSE 360-4.14 MG/200ML-% IV SOLN
30.0000 mg/h | INTRAVENOUS | Status: DC
  Administered 2014-05-09 – 2014-05-12 (×7): 30 mg/h via INTRAVENOUS
  Filled 2014-05-08 (×18): qty 200

## 2014-05-08 MED ORDER — METOPROLOL TARTRATE 1 MG/ML IV SOLN
5.0000 mg | Freq: Four times a day (QID) | INTRAVENOUS | Status: DC
  Administered 2014-05-08 (×2): 5 mg via INTRAVENOUS
  Filled 2014-05-08 (×6): qty 5

## 2014-05-08 MED ORDER — AMIODARONE HCL IN DEXTROSE 360-4.14 MG/200ML-% IV SOLN
60.0000 mg/h | INTRAVENOUS | Status: AC
Start: 2014-05-08 — End: 2014-05-09
  Administered 2014-05-08 (×2): 60 mg/h via INTRAVENOUS
  Filled 2014-05-08 (×2): qty 200

## 2014-05-08 MED ORDER — DIPHENHYDRAMINE HCL 50 MG PO CAPS
50.0000 mg | ORAL_CAPSULE | Freq: Once | ORAL | Status: AC
  Administered 2014-05-08: 50 mg via ORAL
  Filled 2014-05-08: qty 1

## 2014-05-08 MED ORDER — AMIODARONE LOAD VIA INFUSION
150.0000 mg | Freq: Once | INTRAVENOUS | Status: AC
  Administered 2014-05-08: 150 mg via INTRAVENOUS
  Filled 2014-05-08: qty 83.34

## 2014-05-08 NOTE — Progress Notes (Signed)
VASCULAR LAB PRELIMINARY  PRELIMINARY  PRELIMINARY  PRELIMINARY  Bilateral lower extremity venous duplex completed.    Preliminary report:  Bilateral:  No evidence of DVT, superficial thrombosis, or Baker's Cyst.   Garyn Waguespack, RVS 05/08/2014, 9:47 AM

## 2014-05-08 NOTE — Progress Notes (Signed)
Patient in accelerated junctional rhythm on the monitor with a rate in the 70s which alternates with sinus rhythm with a rate in the 70s. Remains on amiodarone drip at 60 mg/hr per protocol.  EKG done first was Afib RVR with rate 137, once rate slowed down another EKG done interpretation reads undetermined rhythm, rate 71. Patient is awake and alert at this time, BP 98/49, denies any chest discomfort. Call placed to Dr. Claiborne Billings on call for cardiology, informed him of this no new orders, per Dr. Claiborne Billings ok to continue amiodarone drip at this time. Will hold 0000 dose of lopressor due to BP.

## 2014-05-08 NOTE — Progress Notes (Signed)
Lopressor 5mg  IV 0000 dose held due to BP 95/53 and HR controlled in the 70s at this time. Will continue to monitor for increased HR and determine if Lopressor may need to be given.

## 2014-05-08 NOTE — Progress Notes (Signed)
PT Cancellation Note  Patient Details Name: Gregory Farrell MRN: [id number] DOB: 06-11-1933   Cancelled Treatment:    Reason Eval/Treat Not Completed: Patient at procedure or test/unavailable; pt having 2D echo, will  Check back as schedule permits;   [facility] 05/08/2014, 4:27 PM

## 2014-05-08 NOTE — Progress Notes (Signed)
Pt having more frequent bursts of HR in 160-180s, 6pm dose of lopressor given at this time.

## 2014-05-08 NOTE — Progress Notes (Signed)
[facility] was attempting to transport pt to [facility] for MRI and pt went into AFib RVR rate of 180s, brought him back to his room, see VS as charted.  Pt asymptomatic, Dr Sheran Fava aware, see new orders. Pharmacy to send amiodorone gtt.

## 2014-05-08 NOTE — Progress Notes (Signed)
Pt had run of SVT during 2D echo, rate of 160-180s, asymptomatic. Not sustained, will continue to monitor.

## 2014-05-08 NOTE — Evaluation (Signed)
Clinical/Bedside Swallow Evaluation Patient Details  Name: Gregory Farrell MRN: [id number] Date of Birth: 02-22-1933  Today's Date: 05/08/2014 Time: 4034-7425 SLP Time Calculation (min): 23 min  Past Medical History:  Past Medical History  Diagnosis Date  . Hypertension   . COPD (chronic obstructive pulmonary disease)   . GERD (gastroesophageal reflux disease)   . Renal insufficiency   . Hyperlipidemia   . CAD (coronary artery disease)   . B12 deficiency   . Memory loss   . Vertigo   . Diabetes mellitus     type II, poly neuropathy  . Overweight 01/31/2013  . Lung cancer, hilus 03/22/2014   Past Surgical History:  Past Surgical History  Procedure Laterality Date  . Coronary angioplasty with stent placement      more than 10 years ago  . Inguinal hernia repair  2010  . Video bronchoscopy Bilateral 03/07/2014    Procedure: VIDEO BRONCHOSCOPY WITHOUT FLUORO;  Surgeon: Gregory Delton, MD;  Location: [facility];  Service: Cardiopulmonary;  Laterality: Bilateral;   HPI:  80 y.o. year-old male with history of stage IIIB squamous cell lung cancer for which he has been undergoing chemotherapy and XRT, COPD, GERD, HTN/HLD, CAD s/p stent 10 years ago, T2DM who presents with generalized weakness and lightheadedness. The patient was last at baseline health several weeks ago. Because he had been having severe abdominal pain, he was given a break from his chemotherapy for 1 week which helped. He resumed his chemotherapy last week. The patient and his family report that the chemotherapy causes nausea and decreased appetite. Although he has been trying to drink more fluids, he is only been able to keep down approximately 4-6 ounces of fluid per day for the last several days. Today with dysarthria, confusion;    Assessment / Plan / Recommendation Clinical Impression  Pt presents with normal oropharyngeal swallow function.  There are no focal CN deficits.  Mastication is  efficient.  Swallow response is  consistent/swift.  There are no overt s/s of aspiration.  Pt does have esophageal discomfort due to radiation treatments, leading to avoidance of certain foods/liquids.  Recommend resuming regular diet, thin liquids. No formal speech/language eval is warranted - pt's communication is back to baseline - no dysarthria.  SLP to sign off.  Pt/family agree with results/recs.     Aspiration Risk  Mild    Diet Recommendation Regular;Thin liquid   Liquid Administration via: Cup Medication Administration: Whole meds with liquid Supervision: Patient able to self feed    Other  Recommendations Oral Care Recommendations: Oral care BID   Follow Up Recommendations  None    Swallow Study Prior Functional Status       General Date of Onset: 05/07/14 HPI: 80 y.o. year-old male with history of stage IIIB squamous cell lung cancer for which he has been undergoing chemotherapy and XRT, COPD, GERD, HTN/HLD, CAD s/p stent 10 years ago, T2DM who presents with generalized weakness and lightheadedness. The patient was last at baseline health several weeks ago. Because he had been having severe abdominal pain, he was given a break from his chemotherapy for 1 week which helped. He resumed his chemotherapy last week. The patient and his family report that the chemotherapy causes nausea and decreased appetite. Although he has been trying to drink more fluids, he is only been able to keep down approximately 4-6 ounces of fluid per day for the last several days. Today with dysarthria, confusion;  Type of Study: Bedside swallow evaluation Previous  Swallow Assessment: none per records Diet Prior to this Study: NPO Temperature Spikes Noted: No Respiratory Status: Room air History of Recent Intubation: No Behavior/Cognition: Alert;Cooperative;Pleasant mood Oral Cavity - Dentition: Dentures, top;Dentures, bottom Self-Feeding Abilities: Able to feed self Patient Positioning: Upright in bed Baseline Vocal Quality:  Clear Volitional Cough: Strong Volitional Swallow: Able to elicit    Oral/Motor/Sensory Function Overall Oral Motor/Sensory Function: Appears within functional limits for tasks assessed   Ice Chips Ice chips: Within functional limits   Thin Liquid Thin Liquid: Within functional limits Presentation: Cup    Nectar Thick Nectar Thick Liquid: Not tested   Honey Thick Honey Thick Liquid: Not tested   Puree Puree: Within functional limits   Solid  Gregory Farrell, [place] CCC/SLP Pager [phone]     Solid: Within functional limits       Gregory Farrell 05/08/2014,5:20 PM

## 2014-05-08 NOTE — Progress Notes (Signed)
[facility] PROGRESS NOTE  Gregory Farrell DZH:[id number] DOB: 12/06/1932 DOA: 05/07/2014 PCP: Gregory Homans, MD  Assessment/Plan  Dehydration and generalized weakness with orthostatic hypotension, still feeling very thirsty - Increase IVF - Continue antiemetics  - PT/OT   Slurred speech with confusion. Higher suspicion for stroke given possible new PAF. FAILED STROKE SWALLOW  - Daily rectal ASA  - MRI/MRA brain at [facility]  - Speech therapy assessment  - Lipid panel and A1c  Lab Results  Component Value Date   CHOL 172 05/08/2014   HDL 30* 05/08/2014   LDLCALC 99 05/08/2014   LDLDIRECT 85.4 04/06/2008   TRIG 214* 05/08/2014   CHOLHDL 5.7 05/08/2014   Lab Results  Component Value Date   HGBA1C 7.6* 05/07/2014    Possible SVT with abberancy (baseline RBBB) -  Continue IV metoprolol until able to transition to PO - Cycle troponins:  neg - TSH 1.36 - ECHO pending - Appreciate cardiology assistance  Acute hypoxic respiratory failure. DDx includes COPD exac. D-dimer elevated.  - CT angio chest:  Improving left hilar and infrahilar mass. Persistent mild abnormal soft tissue surrounding the left lower bronchus with improved aeration.  No evidence of PE.   - Resume dulera  - Xopenex prn  Lower extermity edema may be due to diastolic heart failure or DVT in the setting of malignancy  - Lower extremity duplex:  Negative for DVT - Check ECHO   Mild Acute on chronic kidney disease stage 3, likely prerenal given history  - IVF and repeat BMP in AM   Difficulty urinating  - Check PVR: Retaining urine  - Place foley   Constipation, bisacodyl prn.  - Soap suds enema if needed   CAD, chest pain free but having new arrhythmias  - Continue ASA  - Consider BB, defer to cardiology  - Resume statin but change to another pill form or crush if possible   T2DM, has not received insulin in last day and CBGs are low normal  - A1c 7.6 - continue dextrose fluids and cover with SSI    Squamous cell lung CA  - Oncologist and radiation oncologist added to rounding list.   Anemia of chronic disease and chemotherapy, hgb near baseline  - Trend hemoglobin   Thrombocytopenia likely due to chemotherapy  - Trend platelets   Severe protein calorie malnutrition - Consider appetite stimulant, supplements and liberalized diet once diet advanced  - Nutrition consultation   Diet:  NPO pending speech eval Access: port  IVF: yes  Proph: lovenox   Code Status: FULL  Family Communication: patient and his daughter  Disposition Plan:  Continue telemetry   Consultants:  Cardiology  Procedures:  CXR  CT angio chest  Duplex lower extremities  ECHO  MRI/MRA  Antibiotics:  none   HPI/Subjective:  States that he feels a little bette rthan yesterday but he feels very thirsty.  Has not been out of bed so unsure if he feels lightheaded and unsure if his strength has improved  Objective: Filed Vitals:   05/08/14 0500 05/08/14 0553 05/08/14 0753 05/08/14 0953  BP:  117/67  115/56  Pulse:  77  74  Temp:    98.5 F (36.9 C)  TempSrc:    Oral  Resp:    18  Height:      Weight: 81.4 kg (179 lb 7.3 oz)     SpO2:   94% 93%    Intake/Output Summary (Last 24 hours) at 05/08/14 1135 Last data filed at  05/08/14 1113  Gross per 24 hour  Intake 936.25 ml  Output   1475 ml  Net -538.75 ml   Filed Weights   05/07/14 1605 05/08/14 0500  Weight: 81.2 kg (179 lb 0.2 oz) 81.4 kg (179 lb 7.3 oz)    Exam:   General:  WM, No acute distress  HEENT:  NCAT, MMM, normal cone bilateral ear, no cerumen impaction, effusions  Cardiovascular:  RRR, nl S1, S2 no mrg, 2+ pulses, warm extremities  Respiratory:  Rales at right base more than left base, no increased WOB  Abdomen:   NABS, soft, NT/ND  MSK:   Normal tone and bulk, 1+ bilateral LEE  Neuro:  Grossly moves all extremities  Data Reviewed: Basic Metabolic Panel:  Recent Labs Lab 05/05/14 1332  05/07/14 1026 05/08/14 0355  NA 138 138 136*  K 4.3 4.4 4.9  CL 98 100 101  CO2 27 25 26   GLUCOSE 145* 125* 164*  BUN 9 16 17   CREATININE 1.4* 1.77* 1.62*  CALCIUM 8.9 8.5 8.0*  MG  --   --  2.1   Liver Function Tests:  Recent Labs Lab 05/05/14 1332 05/07/14 1026  AST 19 17  ALT 15 13  ALKPHOS 95* 101  BILITOT 0.70 0.5  PROT 6.3* 6.1  ALBUMIN  --  2.6*   No results found for this basename: LIPASE, AMYLASE,  in the last 168 hours No results found for this basename: AMMONIA,  in the last 168 hours CBC:  Recent Labs Lab 05/05/14 1331 05/07/14 1026 05/08/14 0355  WBC 3.8* 4.8 2.8*  NEUTROABS 2.9 4.4  --   HGB 12.2* 11.5* 9.8*  HCT 38.2* 36.2* 31.2*  MCV 96 96.8 96.3  PLT 156 140* 127*   Cardiac Enzymes:  Recent Labs Lab 05/07/14 1609 05/07/14 2135 05/08/14 0355  TROPONINI <0.30 <0.30 <0.30   BNP (last 3 results) No results found for this basename: PROBNP,  in the last 8760 hours CBG:  Recent Labs Lab 05/07/14 1638 05/07/14 2243 05/08/14 0553  GLUCAP 102* 115* 149*    No results found for this or any previous visit (from the past 240 hour(s)).   Studies: Ct Angio Chest Pe W/cm &/or Wo Cm  05/07/2014   CLINICAL DATA:  History of stage IIIB squamous cell lung cancer, undergoing chemotherapy and radiation. Generalized weakness and lightheadedness. Shortness of breath, tachycardia. Evaluate for pulmonary embolus.  EXAM: CT ANGIOGRAPHY CHEST WITH CONTRAST  TECHNIQUE: Multidetector CT imaging of the chest was performed using the standard protocol during bolus administration of intravenous contrast. Multiplanar CT image reconstructions and MIPs were obtained to evaluate the vascular anatomy.  CONTRAST:  8mL OMNIPAQUE IOHEXOL 350 MG/ML SOLN  COMPARISON:  02/23/2014  FINDINGS: No filling defects in the pulmonary arteries to suggest pulmonary emboli. Stable moderate-sized hiatal hernia. Heart is normal size. Aorta is normal caliber. Densely calcified coronary  arteries. Moderate calcifications throughout the aorta which is mildly tortuous.  No mediastinal, hilar, or axillary adenopathy. Stable small left hilar lymph node measuring 9 mm in Gregory Farrell axis diameter on image 43. Chest wall soft tissues are unremarkable.  Significant improvement in left lower lobe atelectasis/collapse since prior study. The central left hilar/infrahilar mass much improved and difficult to measure. This soft tissue surrounds the left lower lobe bronchus on image 50, but again is improved.  Right basilar atelectasis. Small subpleural nodule in the left upper lobe measures 3 mm on image 41, stable. No new or enlarging pulmonary nodules. Trace bilateral pleural effusions.  No pericardial effusion.  Imaging into the upper abdomen shows no acute findings.  No acute bony abnormality or focal bone lesion. Changes of ankylosing spondylitis again noted within the thoracic spine.  Review of the MIP images confirms the above findings.  IMPRESSION: Improving left hilar/ infrahilar obstructing mass. Mild abnormal soft tissue continues does surround the left lower lobe bronchus, but there has improved aeration with decreasing atelectasis in the left lower lung. Stable small/borderline size left hilar lymph node.  Stable posterior subpleural nodule in the left upper lobe, 3 mm.  Right basilar atelectasis.  Severe coronary artery disease.  Stable hiatal hernia.   Electronically Signed   By: Rolm Baptise M.D.   On: 05/07/2014 23:48   Dg Chest Port 1 View  05/07/2014   CLINICAL DATA:  Patient complaining of shortness of breath today. History of COPD. History of lung carcinoma, coronary artery disease and hypertension. Ex-smoker.  EXAM: PORTABLE CHEST - 1 VIEW  COMPARISON:  10/07/2012.  FINDINGS: Cardiac silhouette is mildly enlarged. No mediastinal or hilar masses.  No lung consolidation or edema.  No mass or discrete nodule.  Opacity superimpose are cardiac silhouette may be due to the aorta or a small hiatal  hernia.  No convincing pleural effusion.  No pneumothorax.  Right anterior chest wall power Port-A-Cath has its tip in the lower superior vena cava.  IMPRESSION: No acute cardiopulmonary disease.   Electronically Signed   By: Lajean Manes M.D.   On: 05/07/2014 11:11    Scheduled Meds: . aspirin  300 mg Rectal Daily  . enoxaparin (LOVENOX) injection  40 mg Subcutaneous Q24H  . fentaNYL  12.5 mcg Transdermal Q72H  . insulin aspart  0-9 Units Subcutaneous 4 times per day  . metoprolol  5 mg Intravenous 4 times per day  . mometasone-formoterol  2 puff Inhalation BID  . ondansetron (ZOFRAN) IV  4 mg Intravenous 4 times per day  . pantoprazole (PROTONIX) IV  40 mg Intravenous BID  . sodium chloride  3 mL Intravenous Q12H   Continuous Infusions: . dextrose 5 % and 0.9% NaCl 75 mL/hr at 05/08/14 1129    Active Problems:   Diabetes mellitus, type 2   Coronary atherosclerosis   Lung cancer, hilus   Dehydration   Acute respiratory failure with hypoxia   Elevated d-dimer   Tachycardia   Generalized weakness   Orthostatic hypotension    Time spent: 30 min    Malay Fantroy, [facility]  [facility] Pager [phone]. If 7PM-7AM, please contact night-coverage at [email or web address], password [email or web address] 05/08/2014, 11:35 AM  LOS: 1 day

## 2014-05-08 NOTE — Progress Notes (Signed)
Unable to do MRI d/t pt being unable to lie flat, MD aware

## 2014-05-08 NOTE — Progress Notes (Signed)
Patient ID: CHRITOPHER COSTER, male   DOB: 29-Apr-1933, 80 y.o.   MRN: [id number]     Subjective:   No complaints this AM   Objective:   Temp:  [98 F (36.7 C)-98.9 F (37.2 C)] 98.3 F (36.8 C) (10/25 0357) Pulse Rate:  [70-104] 77 (10/25 0553) Resp:  [11-34] 18 (10/25 0357) BP: (95-141)/(52-74) 117/67 mmHg (10/25 0553) SpO2:  [88 %-100 %] 94 % (10/25 0753) Weight:  [179 lb 0.2 oz (81.2 kg)-179 lb 7.3 oz (81.4 kg)] 179 lb 7.3 oz (81.4 kg) (10/25 0500) Last BM Date: 05/05/14  Filed Weights   05/07/14 1605 05/08/14 0500  Weight: 179 lb 0.2 oz (81.2 kg) 179 lb 7.3 oz (81.4 kg)    Intake/Output Summary (Last 24 hours) at 05/08/14 0756 Last data filed at 05/08/14 0700  Gross per 24 hour  Intake 936.25 ml  Output   1275 ml  Net -338.75 ml    Telemetry: NSR, short episodes of SVT with aberrance  Exam:  General: NAD  Resp: CTAB  Cardiac: RRR, no m/r/g, no JVD  GI: abdomen soft, NT, ND  MSK: no LE edema  Neuro: no focal deficits  Psych: appropriate affect  Lab Results:  Basic Metabolic Panel:  Recent Labs Lab 05/05/14 1332 05/07/14 1026 05/08/14 0355  NA 138 138 136*  K 4.3 4.4 4.9  CL 98 100 101  CO2 27 25 26   GLUCOSE 145* 125* 164*  BUN 9 16 17   CREATININE 1.4* 1.77* 1.62*  CALCIUM 8.9 8.5 8.0*  MG  --   --  2.1    Liver Function Tests:  Recent Labs Lab 05/05/14 1332 05/07/14 1026  AST 19 17  ALT 15 13  ALKPHOS 95* 101  BILITOT 0.70 0.5  PROT 6.3* 6.1  ALBUMIN  --  2.6*    CBC:  Recent Labs Lab 05/05/14 1331 05/07/14 1026 05/08/14 0355  WBC 3.8* 4.8 2.8*  HGB 12.2* 11.5* 9.8*  HCT 38.2* 36.2* 31.2*  MCV 96 96.8 96.3  PLT 156 140* 127*    Cardiac Enzymes:  Recent Labs Lab 05/07/14 1609 05/07/14 2135 05/08/14 0355  TROPONINI <0.30 <0.30 <0.30    BNP: No results found for this basename: PROBNP,  in the last 8760 hours  Coagulation: No results found for this basename: INR,  in the last 168 hours  ECG:   Medications:     Scheduled Medications: . aspirin  300 mg Rectal Daily  . enoxaparin (LOVENOX) injection  40 mg Subcutaneous Q24H  . fentaNYL  12.5 mcg Transdermal Q72H  . insulin aspart  0-9 Units Subcutaneous 4 times per day  . metoprolol  5 mg Intravenous 4 times per day  . mometasone-formoterol  2 puff Inhalation BID  . ondansetron (ZOFRAN) IV  4 mg Intravenous 4 times per day  . pantoprazole (PROTONIX) IV  40 mg Intravenous BID  . sodium chloride  3 mL Intravenous Q12H     Infusions: . dextrose 5 % and 0.9% NaCl 75 mL/hr at 05/07/14 1831     PRN Medications:  acetaminophen, acetaminophen, bisacodyl, HYDROmorphone (DILAUDID) injection, levalbuterol, LORazepam, prochlorperazine, sodium chloride     Assessment/Plan    1. Wide complex tachycardia  - SVT with aberrancy, QRS complex identical to his baseline RBBB. SVT primarily regular R-R intervals. - TSH 1.36, K 4.9, Mg 2.1. Repeat echo pending. CT PE negative, improving left hilar mass. - describes some occasional palpitations. His dizziness is primarily postional, and with his severe orthostatic changes in ER  doubt arrhythmia playing much role in his dizziness. IVF per primary team, bp has increased. Hopefully with fluid resusictation can tolerate low dose beta blocker, started lopressor 25mg  bid yesterday. He currently is not taking PO and was changed to lopressor 5mg  IV q 6 hrs, change to oral when able. Some SVT overnight, just received first dose of IV metop last night, follow tele today.   2. CAD  - no current chest pain, trop neg and EKG without ischemic changes  - continue risk factor modification. F/u echo.   3. Lung CA - some imrprovement on repeat CT scan, management per primary team      Carlyle Dolly, M.D

## 2014-05-08 NOTE — Progress Notes (Signed)
  Echocardiogram 2D Echocardiogram has been performed.  Gregory Farrell 05/08/2014, 3:47 PM

## 2014-05-09 ENCOUNTER — Ambulatory Visit: Payer: Medicare HMO

## 2014-05-09 ENCOUNTER — Telehealth: Payer: Self-pay | Admitting: Oncology

## 2014-05-09 DIAGNOSIS — I4891 Unspecified atrial fibrillation: Secondary | ICD-10-CM

## 2014-05-09 DIAGNOSIS — E43 Unspecified severe protein-calorie malnutrition: Secondary | ICD-10-CM | POA: Insufficient documentation

## 2014-05-09 DIAGNOSIS — I480 Paroxysmal atrial fibrillation: Secondary | ICD-10-CM

## 2014-05-09 LAB — BASIC METABOLIC PANEL
ANION GAP: 11 (ref 5–15)
BUN: 13 mg/dL (ref 6–23)
CO2: 23 mEq/L (ref 19–32)
CREATININE: 1.35 mg/dL (ref 0.50–1.35)
Calcium: 7.6 mg/dL — ABNORMAL LOW (ref 8.4–10.5)
Chloride: 101 mEq/L (ref 96–112)
GFR calc non Af Amer: 48 mL/min — ABNORMAL LOW (ref >90)
GFR, EST AFRICAN AMERICAN: 56 mL/min — AB (ref >90)
Glucose, Bld: 209 mg/dL — ABNORMAL HIGH (ref 70–99)
Potassium: 3.9 mEq/L (ref 3.7–5.3)
Sodium: 135 mEq/L — ABNORMAL LOW (ref 137–147)

## 2014-05-09 LAB — CBC
HCT: 29.8 % — ABNORMAL LOW (ref 39.0–52.0)
Hemoglobin: 9.5 g/dL — ABNORMAL LOW (ref 13.0–17.0)
MCH: 30.4 pg (ref 26.0–34.0)
MCHC: 31.9 g/dL (ref 30.0–36.0)
MCV: 95.5 fL (ref 78.0–100.0)
PLATELETS: 102 10*3/uL — AB (ref 150–400)
RBC: 3.12 MIL/uL — ABNORMAL LOW (ref 4.22–5.81)
RDW: 19.2 % — ABNORMAL HIGH (ref 11.5–15.5)
WBC: 2.5 10*3/uL — ABNORMAL LOW (ref 4.0–10.5)

## 2014-05-09 LAB — GLUCOSE, CAPILLARY
Glucose-Capillary: 185 mg/dL — ABNORMAL HIGH (ref 70–99)
Glucose-Capillary: 229 mg/dL — ABNORMAL HIGH (ref 70–99)
Glucose-Capillary: 213 mg/dL — ABNORMAL HIGH (ref 70–99)
Glucose-Capillary: 138 mg/dL — ABNORMAL HIGH (ref 70–99)

## 2014-05-09 MED ORDER — INSULIN DETEMIR 100 UNIT/ML ~~LOC~~ SOLN
20.0000 [IU] | Freq: Every day | SUBCUTANEOUS | Status: DC
Start: 2014-05-09 — End: 2014-05-10
  Administered 2014-05-09: 20 [IU] via SUBCUTANEOUS
  Filled 2014-05-09: qty 0.2

## 2014-05-09 MED ORDER — TAMSULOSIN HCL 0.4 MG PO CAPS
0.4000 mg | ORAL_CAPSULE | Freq: Every day | ORAL | Status: DC
  Administered 2014-05-09 – 2014-05-16 (×8): 0.4 mg via ORAL
  Filled 2014-05-09 (×10): qty 1

## 2014-05-09 MED ORDER — BOOST / RESOURCE BREEZE PO LIQD
1.0000 | Freq: Three times a day (TID) | ORAL | Status: DC
  Administered 2014-05-09: 20:00:00 via ORAL
  Administered 2014-05-09 – 2014-05-10 (×2): 1 via ORAL

## 2014-05-09 MED ORDER — ASPIRIN EC 325 MG PO TBEC
325.0000 mg | DELAYED_RELEASE_TABLET | Freq: Every day | ORAL | Status: DC
  Administered 2014-05-09: 325 mg via ORAL
  Filled 2014-05-09 (×2): qty 1

## 2014-05-09 MED ORDER — BISACODYL 10 MG RE SUPP
10.0000 mg | Freq: Once | RECTAL | Status: AC
  Administered 2014-05-09: 10 mg via RECTAL
  Filled 2014-05-09: qty 1

## 2014-05-09 MED ORDER — INSULIN ASPART 100 UNIT/ML ~~LOC~~ SOLN
0.0000 [IU] | Freq: Three times a day (TID) | SUBCUTANEOUS | Status: DC
  Administered 2014-05-09: 3 [IU] via SUBCUTANEOUS
  Administered 2014-05-10: 1 [IU] via SUBCUTANEOUS
  Administered 2014-05-10: 2 [IU] via SUBCUTANEOUS
  Administered 2014-05-10: 1 [IU] via SUBCUTANEOUS
  Administered 2014-05-11: 2 [IU] via SUBCUTANEOUS
  Administered 2014-05-12: 1 [IU] via SUBCUTANEOUS
  Administered 2014-05-12: 2 [IU] via SUBCUTANEOUS
  Administered 2014-05-12: 1 [IU] via SUBCUTANEOUS
  Administered 2014-05-13: 2 [IU] via SUBCUTANEOUS
  Administered 2014-05-14: 1 [IU] via SUBCUTANEOUS
  Administered 2014-05-14: 10:00:00 via SUBCUTANEOUS
  Administered 2014-05-14 – 2014-05-15 (×3): 1 [IU] via SUBCUTANEOUS
  Administered 2014-05-16: 2 [IU] via SUBCUTANEOUS
  Administered 2014-05-16: 1 [IU] via SUBCUTANEOUS
  Administered 2014-05-16: 2 [IU] via SUBCUTANEOUS
  Administered 2014-05-17: 1 [IU] via SUBCUTANEOUS
  Administered 2014-05-17: 5 [IU] via SUBCUTANEOUS

## 2014-05-09 MED ORDER — DOCUSATE SODIUM 100 MG PO CAPS
100.0000 mg | ORAL_CAPSULE | Freq: Two times a day (BID) | ORAL | Status: DC
  Administered 2014-05-09 – 2014-05-10 (×2): 100 mg via ORAL
  Filled 2014-05-09 (×6): qty 1

## 2014-05-09 MED ORDER — SENNA 8.6 MG PO TABS
2.0000 | ORAL_TABLET | Freq: Every day | ORAL | Status: DC
  Filled 2014-05-09: qty 2

## 2014-05-09 MED ORDER — CITALOPRAM HYDROBROMIDE 40 MG PO TABS
40.0000 mg | ORAL_TABLET | Freq: Every day | ORAL | Status: DC
  Administered 2014-05-09: 40 mg via ORAL
  Filled 2014-05-09: qty 1

## 2014-05-09 MED ORDER — VITAMINS A & D EX OINT
TOPICAL_OINTMENT | CUTANEOUS | Status: AC
  Administered 2014-05-09: 5
  Filled 2014-05-09: qty 5

## 2014-05-09 MED ORDER — ONDANSETRON HCL 4 MG PO TABS
4.0000 mg | ORAL_TABLET | Freq: Three times a day (TID) | ORAL | Status: DC
  Filled 2014-05-09 (×4): qty 1

## 2014-05-09 MED ORDER — CITALOPRAM HYDROBROMIDE 20 MG PO TABS
20.0000 mg | ORAL_TABLET | Freq: Every day | ORAL | Status: DC
  Administered 2014-05-10: 20 mg via ORAL
  Filled 2014-05-09: qty 1

## 2014-05-09 MED ORDER — ATORVASTATIN CALCIUM 80 MG PO TABS
80.0000 mg | ORAL_TABLET | Freq: Every day | ORAL | Status: DC
  Filled 2014-05-09: qty 1

## 2014-05-09 MED ORDER — PANTOPRAZOLE SODIUM 40 MG PO TBEC
40.0000 mg | DELAYED_RELEASE_TABLET | Freq: Two times a day (BID) | ORAL | Status: DC
  Administered 2014-05-09 – 2014-05-10 (×2): 40 mg via ORAL
  Filled 2014-05-09 (×3): qty 1

## 2014-05-09 MED ORDER — OXYCODONE HCL 5 MG PO TABS: 5.0000 mg | ORAL_TABLET | ORAL | Status: DC | PRN

## 2014-05-09 MED ORDER — INSULIN ASPART 100 UNIT/ML ~~LOC~~ SOLN
3.0000 [IU] | Freq: Three times a day (TID) | SUBCUTANEOUS | Status: DC
  Administered 2014-05-09: 3 [IU] via SUBCUTANEOUS

## 2014-05-09 MED ORDER — SUCRALFATE 1 GM/10ML PO SUSP
1.0000 g | Freq: Three times a day (TID) | ORAL | Status: DC
  Administered 2014-05-09 – 2014-05-17 (×29): 1 g via ORAL
  Filled 2014-05-09 (×41): qty 10

## 2014-05-09 MED ORDER — LACTULOSE 10 GM/15ML PO SOLN
20.0000 g | Freq: Two times a day (BID) | ORAL | Status: DC
  Administered 2014-05-09 – 2014-05-17 (×14): 20 g via ORAL
  Filled 2014-05-09 (×19): qty 30

## 2014-05-09 MED ORDER — ATORVASTATIN CALCIUM 80 MG PO TABS
80.0000 mg | ORAL_TABLET | Freq: Every day | ORAL | Status: DC
  Administered 2014-05-09 – 2014-05-10 (×2): 80 mg via ORAL
  Filled 2014-05-09 (×3): qty 1

## 2014-05-09 MED ORDER — INSULIN ASPART 100 UNIT/ML ~~LOC~~ SOLN: 0.0000 [IU] | Freq: Every day | SUBCUTANEOUS | Status: DC

## 2014-05-09 MED ORDER — ONDANSETRON HCL 4 MG/2ML IJ SOLN
4.0000 mg | Freq: Four times a day (QID) | INTRAMUSCULAR | Status: DC | PRN
  Filled 2014-05-09: qty 2

## 2014-05-09 MED ORDER — ENSURE PUDDING PO PUDG
1.0000 | Freq: Three times a day (TID) | ORAL | Status: DC
  Administered 2014-05-09 – 2014-05-13 (×4): 1 via ORAL
  Filled 2014-05-09 (×17): qty 1

## 2014-05-09 MED ORDER — PROCHLORPERAZINE MALEATE 10 MG PO TABS
10.0000 mg | ORAL_TABLET | Freq: Four times a day (QID) | ORAL | Status: DC | PRN
Start: 2014-05-09 — End: 2014-05-11
  Filled 2014-05-09: qty 1

## 2014-05-09 MED ORDER — ONDANSETRON HCL 4 MG PO TABS
4.0000 mg | ORAL_TABLET | Freq: Two times a day (BID) | ORAL | Status: DC
  Administered 2014-05-09: 4 mg via ORAL
  Filled 2014-05-09: qty 1

## 2014-05-09 MED ORDER — METOPROLOL TARTRATE 1 MG/ML IV SOLN
2.5000 mg | Freq: Four times a day (QID) | INTRAVENOUS | Status: DC
  Administered 2014-05-09 – 2014-05-13 (×16): 2.5 mg via INTRAVENOUS
  Filled 2014-05-09 (×21): qty 5

## 2014-05-09 NOTE — Progress Notes (Signed)
    Subjective:  Denies CP or dyspnea   Objective:  Filed Vitals:   05/08/14 2103 05/08/14 2323 05/09/14 0507 05/09/14 0519  BP:  98/49 99/55   Pulse:  142 73   Temp:  98.1 F (36.7 C) 98.3 F (36.8 C)   TempSrc:  Oral Oral   Resp:  20 18   Height:      Weight:    181 lb 10.5 oz (82.4 kg)  SpO2: 97% 97% 98%     Intake/Output from previous day:  Intake/Output Summary (Last 24 hours) at 05/09/14 0720 Last data filed at 05/09/14 0700  Gross per 24 hour  Intake 2643.3 ml  Output    800 ml  Net 1843.3 ml    Physical Exam: Physical exam: Well-developed well-nourished in no acute distress.  Skin is warm and dry.  HEENT is normal.  Neck is supple.  Chest is clear to auscultation with normal expansion.  Cardiovascular exam is regular rate and rhythm.  Abdominal exam nontender or distended. No masses palpated. Extremities show no edema. neuro grossly intact    Lab Results: Basic Metabolic Panel:  Recent Labs  05/07/14 1026 05/08/14 0355 05/09/14 0503  NA 138 136* 135*  K 4.4 4.9 3.9  CL 100 101 101  CO2 25 26 23   GLUCOSE 125* 164* 209*  BUN 16 17 13   CREATININE 1.77* 1.62* 1.35  CALCIUM 8.5 8.0* 7.6*  MG  --  2.1  --    CBC:  Recent Labs  05/07/14 1026 05/08/14 0355 05/09/14 0503  WBC 4.8 2.8* 2.5*  NEUTROABS 4.4  --   --   HGB 11.5* 9.8* 9.5*  HCT 36.2* 31.2* 29.8*  MCV 96.8 96.3 95.5  PLT 140* 127* 102*   Cardiac Enzymes:  Recent Labs  05/07/14 1609 05/07/14 2135 05/08/14 0355  TROPONINI <0.30 <0.30 <0.30     Assessment/Plan:  1 PAF-patient is noted to have intermittent atrial fibrillation on telemetry. Presently in sinus; Plan to continue low-dose metoprolol (decreased to 2.5 mg IV every 6 hours) and IV amiodarone. Will transition to oral amiodarone tomorrow morning. He has multiple embolic risk factors including age greater than 67, coronary artery disease, hypertension, diabetes mellitus. He would benefit from long-term  anticoagulation. I would recommend apixaban 5 mg by mouth twice a day once it is clear that his platelet count has reached its nadir from recent chemotherapy. Note LV function is normal. Recent TSH normal. 2 coronary artery disease-Once apixaban initiated, would discontinue aspirin. Resume statin at discharge. 3 hypertension-blood pressure is borderline. Decrease metoprolol as outlined above. 4 lung cancer-management perOncology. 5 dehydration-patient is being hydrated with improvement in symptoms.  Kirk Ruths 05/09/2014, 7:20 AM

## 2014-05-09 NOTE — Progress Notes (Signed)
INITIAL NUTRITION ASSESSMENT  DOCUMENTATION CODES Per approved criteria  -Severe malnutrition in the context of chronic illness  Pt meets criteria for severe MALNUTRITION in the context of chronic illness as evidenced by PO intake < 50% for > 7 days, 2.7% body weight loss in 2 weeks  INTERVENTION: -Consider addition of appetite stimulant;  pt refusing meals, supplements, and snacks. RD will continue to follow to reinforce importance of nutrition in chronic illness and monitor supplement/meal acceptance -Offered nutrition supplement and nourishments; pt and family declined -Provided high kcal/protein nutrition education handout; family accepted handout but declined education   NUTRITION DIAGNOSIS: Inadequate oral intake related to lack of appetite as evidenced by PO intake < 75%.   Goal: Pt to meet >/= 90% of their estimated nutrition needs    Monitor:  Total protein/energy intake, labs, weight, GI profile  Reason for Assessment: MST/Consult to Assess  80 y.o. male  Admitting Dx: <principal problem not specified>  ASSESSMENT: The patient is a 80 y.o. year-old male with history of stage IIIB squamous cell lung cancer for which he has been undergoing chemotherapy and XRT, COPD, GERD, HTN/HLD, CAD s/p stent 10 years ago, T2DM who presents with generalized weakness and lightheadedness. The patient was last at their baseline health several weeks ago  -Pt with decreased appetite since lung cancer dx and subsequent chemo and radiation therapy (03/2014) -Family has tried multiple nutrition supplements, snacks, and other methods to increase PO and fluid intake; however pt continues to have minimal interest and appetite -Has consume mostly liquids for past several days, 4-6 oz/day per H&P.  -Pt refusing meals during admit,0% PO intake -Offered nutrition supplement alternatives and snacks; pt and family declined -Provided pt with nutrition education handout regarding ways to increase kcal and  protein; however, family noted it would not likely be helpful as no foods sound appealing to patient -Consider addition of appetite stimulant for possibly encouragement of PO intake -Pt with 5 lb weight loss in two weeks (2.7% body weight, severe for time frame)   Height: Ht Readings from Last 1 Encounters:  05/07/14 5\' 8"  (1.727 m)    Weight: Wt Readings from Last 1 Encounters:  05/09/14 181 lb 10.5 oz (82.4 kg)    Ideal Body Weight: 154 lb  % Ideal Body Weight: 118%  Wt Readings from Last 10 Encounters:  05/09/14 181 lb 10.5 oz (82.4 kg)  05/05/14 179 lb (81.194 kg)  05/03/14 180 lb 9.6 oz (81.92 kg)  04/27/14 183 lb (83.008 kg)  04/26/14 185 lb (83.915 kg)  04/25/14 185 lb 12.8 oz (84.278 kg)  04/18/14 184 lb (83.462 kg)  04/12/14 182 lb 8 oz (82.781 kg)  04/11/14 181 lb 3.2 oz (82.192 kg)  04/11/14 181 lb 3.2 oz (82.192 kg)    Usual Body Weight: 180-185 lb per previous medical record  % Usual Body Weight: 97%  BMI:  Body mass index is 27.63 kg/(m^2).  Estimated Nutritional Needs: Kcal: 2200-2400 Protein: 100-115 gram Fluid: >/= 2200 ml daily  Skin: WDL  Diet Order: General  EDUCATION NEEDS: -Education needs addressed   Intake/Output Summary (Last 24 hours) at 05/09/14 1158 Last data filed at 05/09/14 0700  Gross per 24 hour  Intake 2643.3 ml  Output    600 ml  Net 2043.3 ml    Last BM: 10/22   Labs:   Recent Labs Lab 05/07/14 1026 05/08/14 0355 05/09/14 0503  NA 138 136* 135*  K 4.4 4.9 3.9  CL 100 101 101  CO2  25 26 23   BUN 16 17 13   CREATININE 1.77* 1.62* 1.35  CALCIUM 8.5 8.0* 7.6*  MG  --  2.1  --   GLUCOSE 125* 164* 209*    CBG (last 3)   Recent Labs  05/08/14 1757 05/08/14 2322 05/09/14 0506  GLUCAP 171* 209* 185*    Scheduled Meds: . aspirin EC  325 mg Oral Daily  . atorvastatin  80 mg Oral q1800  . bisacodyl  10 mg Rectal Once  . citalopram  40 mg Oral Daily  . docusate sodium  100 mg Oral BID  . enoxaparin  (LOVENOX) injection  40 mg Subcutaneous Q24H  . fentaNYL  12.5 mcg Transdermal Q72H  . insulin aspart  0-9 Units Subcutaneous 4 times per day  . lactulose  20 g Oral BID  . metoprolol  2.5 mg Intravenous 4 times per day  . mometasone-formoterol  2 puff Inhalation BID  . ondansetron  4 mg Oral Q12H  . pantoprazole  40 mg Oral BID AC  . senna  2 tablet Oral QHS  . sodium chloride  3 mL Intravenous Q12H  . sucralfate  1 g Oral TID WC & HS  . tamsulosin  0.4 mg Oral QPC supper    Continuous Infusions: . amiodarone 30 mg/hr (05/09/14 0802)    Past Medical History  Diagnosis Date  . Hypertension   . COPD (chronic obstructive pulmonary disease)   . GERD (gastroesophageal reflux disease)   . Renal insufficiency   . Hyperlipidemia   . CAD (coronary artery disease)   . B12 deficiency   . Memory loss   . Vertigo   . Diabetes mellitus     type II, poly neuropathy  . Overweight 01/31/2013  . Lung cancer, hilus 03/22/2014    Past Surgical History  Procedure Laterality Date  . Coronary angioplasty with stent placement      more than 10 years ago  . Inguinal hernia repair  2010  . Video bronchoscopy Bilateral 03/07/2014    Procedure: VIDEO BRONCHOSCOPY WITHOUT FLUORO;  Surgeon: Kathee Delton, MD;  Location: [facility];  Service: Cardiopulmonary;  Laterality: Bilateral;    [place] LDN Clinical Dietitian [phone]

## 2014-05-09 NOTE — Progress Notes (Signed)
OT Cancellation Note  Patient Details Name: Gregory Farrell MRN: [id number] DOB: [birth year]   Cancelled Treatment:    Reason Eval/Treat Not Completed: Medical issues which prohibited therapy (irregular heart rate.) Nsg asking to hold therapy at this time. Will assess when pt is appropriate.   [place], OTR/L  [phone] 05/09/2014 05/09/2014, 8:22 AM

## 2014-05-09 NOTE — Telephone Encounter (Signed)
Called for report on Gregory Farrell.  Per Shirlee Limerick, RN, he is having runs of junctional rhythm and atrial fibrillation.  He is currently on an amiodarone drip.  Per Dr. Sondra Come, treatment should be held today. Notified Jennifer, RT on [place] [facility].

## 2014-05-09 NOTE — Progress Notes (Signed)
PT Cancellation Note  Patient Details Name: Gregory Farrell MRN: [id number] DOB: 29-Apr-1933   Cancelled Treatment:    Reason Eval/Treat Not Completed: Medical issues which prohibited therapy (pt on amiodarone drip for a fib, hold PT per RN. Will follow. )   Philomena Doheny 05/09/2014, 11:26 AM [phone]

## 2014-05-09 NOTE — Progress Notes (Addendum)
[facility] PROGRESS NOTE  Gregory Farrell ZRA:[id number] DOB: [birth year] DOA: 05/07/2014 PCP: [name], MD  Assessment/Plan  Dehydration with orthostatic hypotension, feeling less thirsty today -  D/c IVF  Generalized weakness - PT/OT   Slurred speech with confusion, now resolved but concerning for possible stroke given his transient dysphagia - Daily ASA  - MRI/MRA brain at [facility] today  - failed initial swallow but passed speech therapy's swallow eval on 10/25 - Cholesterol not fully at goal, but maximized on atorvastatin - Consider transition to crestor - Continue diabetes management -  Will need carotid duplex if stroke on MRI  Possible SVT with abberancy (baseline RBBB) vs. a-fib with RVR 10/25 that responded to amiodarone infusion.  Normal systolic function and no major valvular dysfunction on ECHO - Cycle troponins:  neg - TSH 1.36 -  IV metop per cardiology -  Continue amiodarone gtt today and transition to po tomorrow  -  Cardiology recommending apixaban as long as plt nadir > 50 000 -  Appreciate cardiology assistance  Acute hypoxic respiratory failure, now resolved.  May have been due to COPD exac.  CT angio chest:  Improving left hilar and infrahilar mass. Persistent mild abnormal soft tissue surrounding the left lower bronchus with improved aeration.  No evidence of PE.   - Continue dulera and prn xopenex  Lower extermity edema may be due to diastolic heart failure or DVT in the setting of malignancy  - Lower extremity duplex:  Negative for DVT - ECHO as above  Mild Acute on chronic kidney disease stage 3, likely prerenal given history and resolved with IVF  Acute urinary retention -  Start flomax -  Continue foley today and try to remove tomorrow  Constipation, bisacodyl prn.  - Soap suds enema if needed   CAD, chest pain free  - Continue ASA, BB, statin per cardiology  T2DM, CBG rose after tesuming diet - A1c 7.6 - d/c IVF  - start levemir 20  units (home dose is 50 units) - add low dose SSI with meals with 3 units AC   Squamous cell lung CA  - Oncologist and radiation oncologist added to rounding list.   Pancytopenia due to recent chemotherapy -  Check differential with tomorrow's labs -  tx for hgb < 7 or for symptomatic anemia  Severe protein calorie malnutrition - Consider appetite stimulant, supplements and liberalized diet once diet advanced  - Nutrition consultation   Diet:  regular Access: port  IVF:  Off Proph: lovenox   Code Status: FULL  Family Communication: patient and daughter.  Daughter states they had a bad experience previously at [facility] and did not even want her father transferred there for MRI.  Somewhat distrustful of healthcare system.  She likes frequent updates and likes to know the minutia of daily events.  Please update on a daily basis, preferably at bedside.   Disposition Plan:  Continue telemetry   Consultants:  Cardiology  Procedures:  CXR  CT angio chest  Duplex lower extremities  ECHO  MRI/MRA  Antibiotics:  none   HPI/Subjective:  Feeling less thirsty.  Denies chest pain, SOB, nausea, vomiting.  Eating okay.  Worried about bowels and not having BM  Objective: Filed Vitals:   05/08/14 2103 05/08/14 2323 05/09/14 0507 05/09/14 0519  BP:  98/49 99/55   Pulse:  142 73   Temp:  98.1 F (36.7 C) 98.3 F (36.8 C)   TempSrc:  Oral Oral   Resp:  20 18  Height:      Weight:    82.4 kg (181 lb 10.5 oz)  SpO2: 97% 97% 98%     Intake/Output Summary (Last 24 hours) at 05/09/14 0829 Last data filed at 05/09/14 0700  Gross per 24 hour  Intake 2643.3 ml  Output    800 ml  Net 1843.3 ml   Filed Weights   05/07/14 1605 05/08/14 0500 05/09/14 0519  Weight: 81.2 kg (179 lb 0.2 oz) 81.4 kg (179 lb 7.3 oz) 82.4 kg (181 lb 10.5 oz)    Exam:   General:  WM, No acute distress  HEENT:  NCAT, MMM  Cardiovascular:  RRR, nl S1, S2 no mrg, 2+ pulses, warm  extremities  Respiratory:  Rales at right base more than left base, no increased WOB  Abdomen:   NABS, soft, NT/ND  MSK:   Normal tone and bulk, trace bilateral LEE  Neuro:  Grossly moves all extremities  Data Reviewed: Basic Metabolic Panel:  Recent Labs Lab 05/05/14 1332 05/07/14 1026 05/08/14 0355 05/09/14 0503  NA 138 138 136* 135*  K 4.3 4.4 4.9 3.9  CL 98 100 101 101  CO2 27 25 26 23   GLUCOSE 145* 125* 164* 209*  BUN 9 16 17 13   CREATININE 1.4* 1.77* 1.62* 1.35  CALCIUM 8.9 8.5 8.0* 7.6*  MG  --   --  2.1  --    Liver Function Tests:  Recent Labs Lab 05/05/14 1332 05/07/14 1026  AST 19 17  ALT 15 13  ALKPHOS 95* 101  BILITOT 0.70 0.5  PROT 6.3* 6.1  ALBUMIN  --  2.6*   No results found for this basename: LIPASE, AMYLASE,  in the last 168 hours No results found for this basename: AMMONIA,  in the last 168 hours CBC:  Recent Labs Lab 05/05/14 1331 05/07/14 1026 05/08/14 0355 05/09/14 0503  WBC 3.8* 4.8 2.8* 2.5*  NEUTROABS 2.9 4.4  --   --   HGB 12.2* 11.5* 9.8* 9.5*  HCT 38.2* 36.2* 31.2* 29.8*  MCV 96 96.8 96.3 95.5  PLT 156 140* 127* 102*   Cardiac Enzymes:  Recent Labs Lab 05/07/14 1609 05/07/14 2135 05/08/14 0355  TROPONINI <0.30 <0.30 <0.30   BNP (last 3 results) No results found for this basename: PROBNP,  in the last 8760 hours CBG:  Recent Labs Lab 05/08/14 0553 05/08/14 1155 05/08/14 1757 05/08/14 2322 05/09/14 0506  GLUCAP 149* 141* 171* 209* 185*    No results found for this or any previous visit (from the past 240 hour(s)).   Studies: Ct Angio Chest Pe W/cm &/or Wo Cm  05/07/2014   CLINICAL DATA:  History of stage IIIB squamous cell lung cancer, undergoing chemotherapy and radiation. Generalized weakness and lightheadedness. Shortness of breath, tachycardia. Evaluate for pulmonary embolus.  EXAM: CT ANGIOGRAPHY CHEST WITH CONTRAST  TECHNIQUE: Multidetector CT imaging of the chest was performed using the standard  protocol during bolus administration of intravenous contrast. Multiplanar CT image reconstructions and MIPs were obtained to evaluate the vascular anatomy.  CONTRAST:  83mL OMNIPAQUE IOHEXOL 350 MG/ML SOLN  COMPARISON:  02/23/2014  FINDINGS: No filling defects in the pulmonary arteries to suggest pulmonary emboli. Stable moderate-sized hiatal hernia. Heart is normal size. Aorta is normal caliber. Densely calcified coronary arteries. Moderate calcifications throughout the aorta which is mildly tortuous.  No mediastinal, hilar, or axillary adenopathy. Stable small left hilar lymph node measuring 9 mm in [place] axis diameter on image 43. Chest wall soft tissues are unremarkable.  Significant improvement in left lower lobe atelectasis/collapse since prior study. The central left hilar/infrahilar mass much improved and difficult to measure. This soft tissue surrounds the left lower lobe bronchus on image 50, but again is improved.  Right basilar atelectasis. Small subpleural nodule in the left upper lobe measures 3 mm on image 41, stable. No new or enlarging pulmonary nodules. Trace bilateral pleural effusions. No pericardial effusion.  Imaging into the upper abdomen shows no acute findings.  No acute bony abnormality or focal bone lesion. Changes of ankylosing spondylitis again noted within the thoracic spine.  Review of the MIP images confirms the above findings.  IMPRESSION: Improving left hilar/ infrahilar obstructing mass. Mild abnormal soft tissue continues does surround the left lower lobe bronchus, but there has improved aeration with decreasing atelectasis in the left lower lung. Stable small/borderline size left hilar lymph node.  Stable posterior subpleural nodule in the left upper lobe, 3 mm.  Right basilar atelectasis.  Severe coronary artery disease.  Stable hiatal hernia.   Electronically Signed   By: Rolm Baptise M.D.   On: 05/07/2014 23:48   Dg Chest Port 1 View  05/07/2014   CLINICAL DATA:  Patient  complaining of shortness of breath today. History of COPD. History of lung carcinoma, coronary artery disease and hypertension. Ex-smoker.  EXAM: PORTABLE CHEST - 1 VIEW  COMPARISON:  10/07/2012.  FINDINGS: Cardiac silhouette is mildly enlarged. No mediastinal or hilar masses.  No lung consolidation or edema.  No mass or discrete nodule.  Opacity superimpose are cardiac silhouette may be due to the aorta or a small hiatal hernia.  No convincing pleural effusion.  No pneumothorax.  Right anterior chest wall power Port-A-Cath has its tip in the lower superior vena cava.  IMPRESSION: No acute cardiopulmonary disease.   Electronically Signed   By: Lajean Manes M.D.   On: 05/07/2014 11:11    Scheduled Meds: . aspirin  300 mg Rectal Daily  . enoxaparin (LOVENOX) injection  40 mg Subcutaneous Q24H  . fentaNYL  12.5 mcg Transdermal Q72H  . insulin aspart  0-9 Units Subcutaneous 4 times per day  . metoprolol  2.5 mg Intravenous 4 times per day  . mometasone-formoterol  2 puff Inhalation BID  . ondansetron (ZOFRAN) IV  4 mg Intravenous 4 times per day  . pantoprazole (PROTONIX) IV  40 mg Intravenous BID  . sodium chloride  3 mL Intravenous Q12H   Continuous Infusions: . amiodarone 30 mg/hr (05/09/14 0802)  . dextrose 5 % and 0.9% NaCl 125 mL/hr at 05/08/14 4034    Active Problems:   Diabetes mellitus, type 2   Coronary atherosclerosis   Lung cancer, hilus   Dehydration   Acute respiratory failure with hypoxia   Elevated d-dimer   Tachycardia   Generalized weakness   Orthostatic hypotension   Atrial fibrillation    Time spent: 30 min    Agron Swiney, [place] [facility] Pager [phone]. If 7PM-7AM, please contact night-coverage at [email or web address], password [facility] 05/09/2014, 8:29 AM  LOS: 2 days

## 2014-05-09 NOTE — Progress Notes (Addendum)
Gregory Farrell  [phone]   Requesting Provider: [facility]  Consulting Provider: Allen Kell  Primary Oncologist: Riley Churches CONSULTATION  NOTE  Reason for Consultation: Lung Cancer  HPI: 80 year old man with a history of Squamous cell carcinoma on chemo and radiation as described below, currently day 6 post C5 chemo with [place] and Taxol with concurrent radiation, admitted on 10/24 with dizziness and orthostatic hypotension, found to have wide complex tachycardia and atrial fibrillation requiring cardiologist evaluation, recommending IVF resuscitation, and antiarrhythmics. CT angio of the chest on 10/24 showed improving left hilar/ infrahilar obstructing mass. Mild abnormal soft tissue continues does surround the left lower lobe bronchus, but there has improved aeration with decreasing atelectasis in the left lower lung. Stable small/borderline size left hilar lymph node. Stable posterior subpleural nodule in the left upper lobe, 3 mm. He denies any shortness of breath. No fever or chills. Denies chest pain. Appetite is decreased. Feeling weak. 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 Planned C 6 for 10/29  MEDICATIONS:  Scheduled Meds: . aspirin EC  325 mg Oral Daily  . atorvastatin  80 mg Oral q1800  . bisacodyl  10 mg Rectal Once  . citalopram  40 mg Oral Daily  . docusate sodium  100 mg Oral BID  . enoxaparin (LOVENOX) injection  40 mg Subcutaneous Q24H  . fentaNYL  12.5 mcg Transdermal Q72H  . insulin aspart  0-9 Units Subcutaneous 4 times per day  . lactulose  20 g Oral BID  . metoprolol  2.5 mg Intravenous 4 times per day  . mometasone-formoterol  2 puff Inhalation BID  . ondansetron  4 mg Oral Q12H  .  pantoprazole  40 mg Oral BID AC  . senna  2 tablet Oral QHS  . sodium chloride  3 mL Intravenous Q12H  . sucralfate  1 g Oral TID WC & HS  . tamsulosin  0.4 mg Oral QPC supper  . vitamin A & D       Continuous Infusions: . amiodarone 30 mg/hr (05/09/14 0802)   PRN Meds:.acetaminophen, acetaminophen, bisacodyl, levalbuterol, LORazepam, oxyCODONE, prochlorperazine, sodium chloride  ALLERGIES:  Allergies  Allergen Reactions  . Exenatide     REACTION: nausea      PHYSICAL EXAMINATION:   Filed Vitals:   05/09/14 0507  BP: 99/55  Pulse: 73  Temp: 98.3 F (36.8 C)  Resp: 18   Filed Weights   05/07/14 1605 05/08/14 0500 05/09/14 0519  Weight: 179 lb 0.2 oz (81.2 kg) 179 lb 7.3 oz (81.4 kg) 181 lb 10.5 oz (82.76 kg)    80 year old in no acute distress,conversant, alert and oriented to time, place and date.  General well-developed and well-nourished  HEENT: Normocephalic, atraumatic.Sclera anicteric.Oral cavity without thrush or lesions. Neck:  Supple. No thyromegaly,no cervical or supraclavicular adenopathy  Lungs: Clear to auscultation. No wheezing, rhonchi or rales. Cardiac: Regular rate and rhythm, no murmur,rubs or gallops, occasional ectopic beat. Abdomen: Soft nontender,bowel sounds x4. Nohepatosplenomegaly Extremities: No clubbing cyanosis or edema. No petechial rash Neuro: No focal or motor deficits  LABORATORY/RADIOLOGY DATA:   Recent Labs Lab 05/05/14 1331 05/07/14 1026 05/08/14 0355 05/09/14 0503  WBC 3.8* 4.8 2.8* 2.5*  HGB 12.2* 11.5* 9.8* 9.5*  HCT 38.2* 36.2* 31.2* 29.8*  PLT 156  140* 127* 102*  MCV 96 96.8 96.3 95.5  MCH 30.7 30.7 30.2 30.4  MCHC 31.9* 31.8 31.4 31.9  RDW 18.7* 19.4* 19.4* 19.2*  LYMPHSABS 0.5* 0.3*  --   --   MONOABS  --  0.2  --   --   EOSABS 0.1 0.0  --   --   BASOSABS 0.0 0.0  --   --     CMP    Recent Labs Lab 05/05/14 1332 05/07/14 1026 05/08/14 0355 05/09/14 0503  NA 138 138 136* 135*  K 4.3 4.4 4.9 3.9  CL  98 100 101 101  CO2 27 25 26 23   GLUCOSE 145* 125* 164* 209*  BUN 9 16 17 13   CREATININE 1.4* 1.77* 1.62* 1.35  CALCIUM 8.9 8.5 8.0* 7.6*  MG  --   --  2.1  --   AST 19 17  --   --   ALT 15 13  --   --   ALKPHOS 95* 101  --   --   BILITOT 0.70 0.5  --   --         Component Value Date/Time   BILITOT 0.5 05/07/2014 1026   BILITOT 0.70 05/05/2014 1332   BILIDIR 0.1 02/11/2014 0923   IBILI 0.6 02/11/2014 0923    Anemia panel:   No results found for this basename: VITAMINB12, FOLATE, FERRITIN, TIBC, IRON, RETICCTPCT,  in the last 72 hours   Recent Labs  05/07/14 1609  TSH 1.360       Urinalysis    Component Value Date/Time   COLORURINE YELLOW 07/06/2013 1110   APPEARANCEUR CLEAR 07/06/2013 1110   LABSPEC 1.025 07/06/2013 1110   PHURINE 5.0 07/06/2013 1110   GLUCOSEU 100* 07/06/2013 1110   HGBUR NEG 07/06/2013 1110   BILIRUBINUR SMALL* 07/06/2013 1110   KETONESUR TRACE* 07/06/2013 1110   PROTEINUR NEG 07/06/2013 1110   UROBILINOGEN 0.2 07/06/2013 1110   NITRITE NEG 07/06/2013 1110   LEUKOCYTESUR NEG 07/06/2013 1110    Drugs of Abuse  No results found for this basename: labopia, cocainscrnur, labbenz, amphetmu, thcu, labbarb     Liver Function Tests:  Recent Labs Lab 05/05/14 1332 05/07/14 1026  AST 19 17  ALT 15 13  ALKPHOS 95* 101  BILITOT 0.70 0.5  PROT 6.3* 6.1  ALBUMIN  --  2.6*   CBG:  Recent Labs Lab 05/08/14 1155 05/08/14 1757 05/08/14 2322 05/09/14 0506 05/09/14 1151  GLUCAP 141* 171* 209* 185* 229*   Hgb A1c  Recent Labs  05/07/14 1609  HGBA1C 7.6*    Cardiac Enzymes:  Recent Labs Lab 05/07/14 1609 05/07/14 2135 05/08/14 0355  TROPONINI <0.30 <0.30 <0.30    BNP: No components found with this basename: POCBNP,   D-Dimer  Recent Labs  05/07/14 1609  DDIMER 7.47*    Thyroid function studies  Recent Labs  05/07/14 1609  TSH 1.360    Radiology Studies:  Ct Angio Chest Pe W/cm &/or Wo Cm  05/07/2014    CLINICAL DATA:  History of stage IIIB squamous cell lung cancer, undergoing chemotherapy and radiation. Generalized weakness and lightheadedness. Shortness of breath, tachycardia. Evaluate for pulmonary embolus.  EXAM: CT ANGIOGRAPHY CHEST WITH CONTRAST  TECHNIQUE: Multidetector CT imaging of the chest was performed using the standard protocol during bolus administration of intravenous contrast. Multiplanar CT image reconstructions and MIPs were obtained to evaluate the vascular anatomy.  CONTRAST:  74mL OMNIPAQUE IOHEXOL 350 MG/ML SOLN  COMPARISON:  02/23/2014  FINDINGS: No filling  defects in the pulmonary arteries to suggest pulmonary emboli. Stable moderate-sized hiatal hernia. Heart is normal size. Aorta is normal caliber. Densely calcified coronary arteries. Moderate calcifications throughout the aorta which is mildly tortuous.  No mediastinal, hilar, or axillary adenopathy. Stable small left hilar lymph node measuring 9 mm in short axis diameter on image 43. Chest wall soft tissues are unremarkable.  Significant improvement in left lower lobe atelectasis/collapse since prior study. The central left hilar/infrahilar mass much improved and difficult to measure. This soft tissue surrounds the left lower lobe bronchus on image 50, but again is improved.  Right basilar atelectasis. Small subpleural nodule in the left upper lobe measures 3 mm on image 41, stable. No new or enlarging pulmonary nodules. Trace bilateral pleural effusions. No pericardial effusion.  Imaging into the upper abdomen shows no acute findings.  No acute bony abnormality or focal bone lesion. Changes of ankylosing spondylitis again noted within the thoracic spine.  Review of the MIP images confirms the above findings.  IMPRESSION: Improving left hilar/ infrahilar obstructing mass. Mild abnormal soft tissue continues does surround the left lower lobe bronchus, but there has improved aeration with decreasing atelectasis in the left lower lung.  Stable small/borderline size left hilar lymph node.  Stable posterior subpleural nodule in the left upper lobe, 3 mm.  Right basilar atelectasis.  Severe coronary artery disease.  Stable hiatal hernia.   Electronically Signed   By: Rolm Baptise M.D.   On: 05/07/2014 23:48   Dg Chest Port 1 View  05/07/2014   CLINICAL DATA:  Patient complaining of shortness of breath today. History of COPD. History of lung carcinoma, coronary artery disease and hypertension. Ex-smoker.  EXAM: PORTABLE CHEST - 1 VIEW  COMPARISON:  10/07/2012.  FINDINGS: Cardiac silhouette is mildly enlarged. No mediastinal or hilar masses.  No lung consolidation or edema.  No mass or discrete nodule.  Opacity superimpose are cardiac silhouette may be due to the aorta or a small hiatal hernia.  No convincing pleural effusion.  No pneumothorax.  Right anterior chest wall power Port-A-Cath has its tip in the lower superior vena cava.  IMPRESSION: No acute cardiopulmonary disease.   Electronically Signed   By: Lajean Manes M.D.   On: 05/07/2014 11:11       ASSESSMENT AND PLAN:  1. 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 Planned C 6 for 10/29 Chemo plans on hold due to current hospitalization. Continue radiation as scheduled.  CT angio  shows no new oncological findings, stable mass, likely improving.  2. PAF On antiarrhythmics as per Cards.   3. Pancytopenia Secondary to current chemotherapy. No bleeding issues noted. patient is on ASA and on DVT prophylaxis with Lovenox Continue to monitor carefully, no transfusion is indicated at this time.  Other medical issues as per admitting team  4. malnutrition Appreciate Nutritional involvement  4. Full Code     WERTMAN,SARA E, PA-C 05/09/2014, 1:00 PM  ADDENDUM: I saw and examined the patient this morning. I appreciate the help from Dr. Sheran Fava and  cardiology.  I think his main problem is this paroxysmal atrial fibrillation. I'm sure that the etiology is multifactorial. I have no problems with him being on ELIQUIS. His thrombocytopenia is not a problem from my point of view. For now, I would put him on therapeutic Lovenox. This way, he can have upper endoscopy. Once the upper endoscopy is done, then  I would get him onto [facility].  As much as we have told him to eat and drink, he just will not do this.  He now is complaining of a lot of reflux. I think that he needs to be seen by gastroenterology and have upper endoscopy. I will call them.  He is persistently anemic. This is multifactorial.  He has diabetes. I probably will change his IV fluid to normal saline.  I clearly agree with physical therapy. He definitely is in need of physical therapy.  It is no surprise that the CAT scan has shown that he is responding. He will continue to respond. He just finished his radiation last week so we should be able to see a continued response.  He is on a fentanyl patch. He says this has not been changed since he's been here. I will look into this.  It has been very difficult getting him through therapy. Mostly, he just would not eat or drink. He would get dehydrated. We would tell him to come to the office for IV fluids but he did not do this all that much.  His performance status is marginal. He really needs to have a lot of strengthening. A lot of this will be through nutrition. This is why an upper endoscopy will be helpful to make sure he has no type of esophageal infection causing his dysphagia and odynophagia.  We will follow along. Even though he is not that anemic, he may benefit from a transfusion. We will see what his blood work looks like tomorrow.  Again, I very much appreciate the outstanding care that he is getting on 4 W.  [name]  Ephesians 2:8-9

## 2014-05-09 NOTE — Progress Notes (Signed)
Nutrition Note  RD approached by RN, who reported pt consumed 100% of Ensure Pudding and >75% of Resource Breeze when promoted by [employer]. Will order supplements, and follow up on 10/27. Appreciate RN encouragement.  Intervention: -Ensure Pudding po TID, each supplement provides 170 kcal and 4 grams of protein -Resource Breeze po TID, each supplement provides 250 kcal and 9 grams of protein   Will continue to monitor per protocols. Please re-consult as needed.  Gregory Abide MS RD LDN Clinical Dietitian [phone]

## 2014-05-10 ENCOUNTER — Ambulatory Visit: Admission: RE | Admit: 2014-05-10 | Payer: Medicare HMO | Source: Ambulatory Visit

## 2014-05-10 ENCOUNTER — Ambulatory Visit: Payer: Medicare HMO

## 2014-05-10 DIAGNOSIS — D61810 Antineoplastic chemotherapy induced pancytopenia: Secondary | ICD-10-CM

## 2014-05-10 DIAGNOSIS — I959 Hypotension, unspecified: Secondary | ICD-10-CM

## 2014-05-10 DIAGNOSIS — E46 Unspecified protein-calorie malnutrition: Secondary | ICD-10-CM

## 2014-05-10 DIAGNOSIS — C3402 Malignant neoplasm of left main bronchus: Secondary | ICD-10-CM

## 2014-05-10 LAB — CBC WITH DIFFERENTIAL/PLATELET
BASOS ABS: 0 10*3/uL (ref 0.0–0.1)
Basophils Relative: 0 % (ref 0–1)
Eosinophils Absolute: 0 10*3/uL (ref 0.0–0.7)
Eosinophils Relative: 2 % (ref 0–5)
HEMATOCRIT: 29.8 % — AB (ref 39.0–52.0)
Hemoglobin: 9.6 g/dL — ABNORMAL LOW (ref 13.0–17.0)
Lymphocytes Relative: 14 % (ref 12–46)
Lymphs Abs: 0.3 10*3/uL — ABNORMAL LOW (ref 0.7–4.0)
MCH: 31.3 pg (ref 26.0–34.0)
MCHC: 32.2 g/dL (ref 30.0–36.0)
MCV: 97.1 fL (ref 78.0–100.0)
Monocytes Absolute: 0.1 10*3/uL (ref 0.1–1.0)
Monocytes Relative: 7 % (ref 3–12)
Neutro Abs: 1.5 10*3/uL — ABNORMAL LOW (ref 1.7–7.7)
Neutrophils Relative %: 78 % — ABNORMAL HIGH (ref 43–77)
Platelets: 111 10*3/uL — ABNORMAL LOW (ref 150–400)
RBC: 3.07 MIL/uL — ABNORMAL LOW (ref 4.22–5.81)
RDW: 19.2 % — AB (ref 11.5–15.5)
WBC: 1.9 10*3/uL — ABNORMAL LOW (ref 4.0–10.5)

## 2014-05-10 LAB — GLUCOSE, CAPILLARY
GLUCOSE-CAPILLARY: 181 mg/dL — AB (ref 70–99)
Glucose-Capillary: 128 mg/dL — ABNORMAL HIGH (ref 70–99)
Glucose-Capillary: 121 mg/dL — ABNORMAL HIGH (ref 70–99)
Glucose-Capillary: 119 mg/dL — ABNORMAL HIGH (ref 70–99)

## 2014-05-10 LAB — BASIC METABOLIC PANEL
Anion gap: 10 (ref 5–15)
BUN: 9 mg/dL (ref 6–23)
CO2: 24 meq/L (ref 19–32)
Calcium: 7.8 mg/dL — ABNORMAL LOW (ref 8.4–10.5)
Chloride: 103 mEq/L (ref 96–112)
Creatinine, Ser: 1.19 mg/dL (ref 0.50–1.35)
GFR calc Af Amer: 65 mL/min — ABNORMAL LOW (ref >90)
GFR calc non Af Amer: 56 mL/min — ABNORMAL LOW (ref >90)
Glucose, Bld: 122 mg/dL — ABNORMAL HIGH (ref 70–99)
Potassium: 3.7 mEq/L (ref 3.7–5.3)
Sodium: 137 mEq/L (ref 137–147)

## 2014-05-10 MED ORDER — OXYCODONE HCL 5 MG/5ML PO SOLN: 5.0000 mg | ORAL | Status: DC | PRN

## 2014-05-10 MED ORDER — ENOXAPARIN SODIUM 40 MG/0.4ML ~~LOC~~ SOLN
40.0000 mg | SUBCUTANEOUS | Status: DC
  Filled 2014-05-10: qty 0.4

## 2014-05-10 MED ORDER — SODIUM CHLORIDE 0.9 % IV SOLN
INTRAVENOUS | Status: DC
  Administered 2014-05-10 – 2014-05-11 (×2): via INTRAVENOUS
  Administered 2014-05-12: 500 mL via INTRAVENOUS

## 2014-05-10 MED ORDER — POLYETHYLENE GLYCOL 3350 17 G PO PACK
17.0000 g | PACK | Freq: Every day | ORAL | Status: DC
  Filled 2014-05-10: qty 1

## 2014-05-10 MED ORDER — MIRTAZAPINE 15 MG PO TABS
15.0000 mg | ORAL_TABLET | Freq: Every day | ORAL | Status: DC
  Filled 2014-05-10: qty 1

## 2014-05-10 MED ORDER — METOCLOPRAMIDE HCL 5 MG/5ML PO SOLN
10.0000 mg | Freq: Three times a day (TID) | ORAL | Status: DC
  Administered 2014-05-10 – 2014-05-16 (×18): 10 mg via ORAL
  Filled 2014-05-10 (×29): qty 10

## 2014-05-10 MED ORDER — MIRTAZAPINE 15 MG PO TBDP
15.0000 mg | ORAL_TABLET | Freq: Every day | ORAL | Status: DC
  Administered 2014-05-13 – 2014-05-14 (×2): 15 mg via ORAL
  Filled 2014-05-10 (×8): qty 1

## 2014-05-10 MED ORDER — CITALOPRAM HYDROBROMIDE 20 MG PO TABS
20.0000 mg | ORAL_TABLET | Freq: Every day | ORAL | Status: DC
  Filled 2014-05-10: qty 1

## 2014-05-10 MED ORDER — PANTOPRAZOLE SODIUM 40 MG IV SOLR
40.0000 mg | Freq: Two times a day (BID) | INTRAVENOUS | Status: DC
  Administered 2014-05-10 – 2014-05-11 (×2): 40 mg via INTRAVENOUS
  Filled 2014-05-10 (×3): qty 40

## 2014-05-10 MED ORDER — ONDANSETRON 4 MG PO TBDP
4.0000 mg | ORAL_TABLET | Freq: Three times a day (TID) | ORAL | Status: DC
  Administered 2014-05-10 – 2014-05-17 (×11): 4 mg via ORAL
  Filled 2014-05-10 (×27): qty 1

## 2014-05-10 MED ORDER — INSULIN DETEMIR 100 UNIT/ML ~~LOC~~ SOLN
15.0000 [IU] | Freq: Every day | SUBCUTANEOUS | Status: DC
  Filled 2014-05-10: qty 0.15

## 2014-05-10 MED ORDER — ENOXAPARIN SODIUM 100 MG/ML ~~LOC~~ SOLN
1.0000 mg/kg | SUBCUTANEOUS | Status: DC
  Administered 2014-05-10: 85 mg via SUBCUTANEOUS
  Filled 2014-05-10: qty 1

## 2014-05-10 MED ORDER — LORAZEPAM 2 MG/ML PO CONC
1.0000 mg | ORAL | Status: DC | PRN
  Administered 2014-05-10: 1 mg via ORAL
  Filled 2014-05-10: qty 1

## 2014-05-10 MED ORDER — PANTOPRAZOLE SODIUM 40 MG PO PACK
40.0000 mg | PACK | Freq: Two times a day (BID) | ORAL | Status: DC
Start: 2014-05-10 — End: 2014-05-10
  Administered 2014-05-10: 40 mg via ORAL
  Filled 2014-05-10 (×2): qty 20

## 2014-05-10 NOTE — Progress Notes (Signed)
ANTICOAGULATION CONSULT NOTE - Initial Consult  Pharmacy Consult for enoxaparin Indication: VTE prophylaxis  Allergies  Allergen Reactions  . Exenatide     REACTION: nausea    Patient Measurements: Height: 5\' 8"  (172.7 cm) Weight: 183 lb 13.8 oz (83.4 kg) IBW/kg (Calculated) : 68.4 Heparin Dosing Weight:   Vital Signs: Temp: 98.4 F (36.9 C) (10/27 0629) Temp Source: Oral (10/27 0629) BP: 140/61 mmHg (10/27 0629) Pulse Rate: 71 (10/27 0629)  Labs:  Recent Labs  05/07/14 1609 05/07/14 2135  05/08/14 0355 05/09/14 0503 05/10/14 0449  HGB  --   --   < > 9.8* 9.5* 9.6*  HCT  --   --   --  31.2* 29.8* 29.8*  PLT  --   --   --  127* 102* 111*  CREATININE  --   --   --  1.62* 1.35 1.19  TROPONINI <0.30 <0.30  --  <0.30  --   --   < > = values in this interval not displayed.  Estimated Creatinine Clearance: 53 ml/min (by C-G formula based on Cr of 1.19).   Assessment: 55 YOM with afib, enoxaparin increased to 85mg  SQ daily this morning.  Discussed with TRH this afternoon regarding frequency and if therapeutic dosing intended.  Current TRH wants VTE prophylaxis dosing while awaiting possible endoscopy.  Plans for full dose anticoagulation after endoscopy.    Scr is WNL with CrCl > 40ml/min  CBC Hgb = 9.6, pltc = 111  Goal of Therapy:  Anti-Xa level 0.3-0.6 units/ml 4hrs after LMWH dose given   Plan:   Lovenox 40mg  SQ q24h, starting 10/28 10am  Await anticoagulation plans following endoscopy, likely apixaban as per cardiology's note  Doreene Eland, PharmD, BCPS.   Pager: [phone]  05/10/2014,2:05 PM

## 2014-05-10 NOTE — Progress Notes (Signed)
[facility] PROGRESS NOTE  Gregory Farrell GUR:[id number] DOB: 30-Nov-1932 DOA: 05/07/2014 PCP: [name], MD  Brief Summary  The patient is a 80 y.o. year-old male with history of stage IIIB squamous cell lung cancer for which he has been undergoing chemotherapy and XRT, COPD, GERD, HTN/HLD, CAD s/p stent 10 years ago, T2DM who presented with generalized weakness and lightheadedness.  He completed his chemo and XRT last week but did not come in regularly for IVF despite nausea and poor appetite.  He states approximately 2 weeks ago, he noticed that his heartbeat was beating irregular intermittently.  The morning of admission, he developed slurred speech, clouded thinking, blurred vision, and dizziness. He has had a-fib with RVR and possibly SVT intermittently and has been started on amiodarone per cardiology.  Stroke work up is on hold because he is too kyphotic to fit in [facility] MRI machine and needs to go to the larger MRI machine at [facility], but cannot transfer until his has fewer episodes of tachycardia.  He is not eating and having dysphagia.  He is very weak.    Assessment/Plan  Slurred speech with confusion and blurred vision, now resolved but concerning for possible stroke - Daily ASA  - MRI/MRA brain at [facility] once HR more stable - failed initial swallow but passed speech therapy's swallow eval on 10/25 - Maximized on atorvastatin - Continue diabetes management -  Will need carotid duplex if stroke on MRI  Possible SVT with abberancy (baseline RBBB) vs. a-fib with RVR 10/25 that responded to amiodarone infusion.  Normal systolic function and no major valvular dysfunction on ECHO.   - Cycle troponins:  neg - TSH 1.36 - amio and metoprolol per cardiology - Hold systemic anticoagulation pending GI assessment and then start full dose a/c post any planned procedures -  Would start apixaban and stop aspirin post procedure -  Appreciate cardiology assistance  Dysphagia -  Change medications  to liquid where possible -  GI consult placed by Dr. Marin Olp for endoscopy to rule out stenosis and underlying infectious esophagitis.    Acute hypoxic respiratory failure, now resolved.  May have been due to COPD exac.  CT angio chest:  Improving left hilar and infrahilar mass. Persistent mild abnormal soft tissue surrounding the left lower bronchus with improved aeration.  No evidence of PE.   - Continue dulera and prn xopenex  Lower extermity edema may be due to diastolic heart failure or DVT in the setting of malignancy  - Lower extremity duplex:  Negative for DVT - ECHO as above  Mild Acute on chronic kidney disease stage 3, likely prerenal given history and resolved with IVF  Acute urinary retention -  Start flomax -  Attempt to remove foley   Constipation, stable, continue lactulose  CAD, chest pain free  - Continue ASA, BB, statin per cardiology  T2DM, CBG now somewhat low today and not eating - A1c 7.6 - reduce levemir to 15 units and stop scheduled mealtime insulin -  Continue low dose SSI  Squamous cell lung CA  - Appreciate oncology assistance    Pancytopenia due to recent chemotherapy -  Check differential with tomorrow's labs -  tx for hgb < 7 or for symptomatic anemia  Severe protein calorie malnutrition -  Start mirtazapine -  Continue supplements and liberalized diet  - Nutrition consultation   Dehydration with orthostatic hypotension, resolved with IVF.    Generalized weakness, likely secondary  - PT/OT    Diet:  regular  Access: port  IVF:  Off Proph: lovenox   Code Status: FULL  Family Communication: patient and daughter.  Daughter states they had a bad experience previously at [facility] and did not even want her father transferred there for MRI.  Somewhat distrustful of healthcare system.  She likes frequent updates and likes to know the minutia of daily events.  Please update on a daily basis, preferably at bedside.   Disposition Plan:  Continue  telemetry   Consultants:  Cardiology  Procedures:  CXR  CT angio chest  Duplex lower extremities  ECHO  MRI/MRA  Antibiotics:  none   HPI/Subjective:  Feeling tired but was up in chair twice so far today.  Denies chest pain, SOB, nausea, vomiting.  Has pain when swallowing pills but soft foods are going down okay.  Had BM yesterday  Objective: Filed Vitals:   05/10/14 0035 05/10/14 0500 05/10/14 0629 05/10/14 0911  BP: 125/66  140/61   Pulse: 81  71   Temp:   98.4 F (36.9 C)   TempSrc:   Oral   Resp:   18   Height:      Weight:  83.4 kg (183 lb 13.8 oz)    SpO2:   98% 94%    Intake/Output Summary (Last 24 hours) at 05/10/14 1327 Last data filed at 05/10/14 1210  Gross per 24 hour  Intake    240 ml  Output    600 ml  Net   -360 ml   Filed Weights   05/08/14 0500 05/09/14 0519 05/10/14 0500  Weight: 81.4 kg (179 lb 7.3 oz) 82.4 kg (181 lb 10.5 oz) 83.4 kg (183 lb 13.8 oz)    Exam:   General:  WM, No acute distress  HEENT:  NCAT, MMM  Cardiovascular:  RRR, nl S1, S2 no mrg, 2+ pulses, warm extremities  Respiratory:  Rales at right base more than left base, no wheezes or rhonchi, no increased WOB  Abdomen:   NABS, soft, NT/ND  MSK:   Normal tone and bulk, trace bilateral LEE, 1+ swelling RUE (lying in right side)  Neuro:  Grossly moves all extremities  Data Reviewed: Basic Metabolic Panel:  Recent Labs Lab 05/05/14 1332 05/07/14 1026 05/08/14 0355 05/09/14 0503 05/10/14 0449  NA 138 138 136* 135* 137  K 4.3 4.4 4.9 3.9 3.7  CL 98 100 101 101 103  CO2 27 25 26 23 24   GLUCOSE 145* 125* 164* 209* 122*  BUN 9 16 17 13 9   CREATININE 1.4* 1.77* 1.62* 1.35 1.19  CALCIUM 8.9 8.5 8.0* 7.6* 7.8*  MG  --   --  2.1  --   --    Liver Function Tests:  Recent Labs Lab 05/05/14 1332 05/07/14 1026  AST 19 17  ALT 15 13  ALKPHOS 95* 101  BILITOT 0.70 0.5  PROT 6.3* 6.1  ALBUMIN  --  2.6*   No results found for this basename: LIPASE,  AMYLASE,  in the last 168 hours No results found for this basename: AMMONIA,  in the last 168 hours CBC:  Recent Labs Lab 05/05/14 1331 05/07/14 1026 05/08/14 0355 05/09/14 0503 05/10/14 0449  WBC 3.8* 4.8 2.8* 2.5* 1.9*  NEUTROABS 2.9 4.4  --   --  1.5*  HGB 12.2* 11.5* 9.8* 9.5* 9.6*  HCT 38.2* 36.2* 31.2* 29.8* 29.8*  MCV 96 96.8 96.3 95.5 97.1  PLT 156 140* 127* 102* 111*   Cardiac Enzymes:  Recent Labs Lab 05/07/14 1609 05/07/14 2135  05/08/14 0355  TROPONINI <0.30 <0.30 <0.30   BNP (last 3 results) No results found for this basename: PROBNP,  in the last 8760 hours CBG:  Recent Labs Lab 05/09/14 1151 05/09/14 1723 05/09/14 2230 05/10/14 0740 05/10/14 1146  GLUCAP 229* 213* 138* 128* 121*    No results found for this or any previous visit (from the past 240 hour(s)).   Studies: No results found.  Scheduled Meds: . atorvastatin  80 mg Oral q1800  . citalopram  20 mg Oral Daily  . enoxaparin (LOVENOX) injection  1 mg/kg Subcutaneous Q24H  . feeding supplement (ENSURE)  1 Container Oral TID WC  . feeding supplement (RESOURCE BREEZE)  1 Container Oral TID BM  . fentaNYL  12.5 mcg Transdermal Q72H  . insulin aspart  0-5 Units Subcutaneous QHS  . insulin aspart  0-9 Units Subcutaneous TID WC  . insulin detemir  15 Units Subcutaneous QHS  . lactulose  20 g Oral BID  . metoCLOPramide  10 mg Oral TID AC  . metoprolol  2.5 mg Intravenous 4 times per day  . mirtazapine  15 mg Oral QHS  . mometasone-formoterol  2 puff Inhalation BID  . ondansetron  4 mg Oral 3 times per day  . pantoprazole sodium  40 mg Oral BID  . polyethylene glycol  17 g Oral Daily  . sodium chloride  3 mL Intravenous Q12H  . sucralfate  1 g Oral TID WC & HS  . tamsulosin  0.4 mg Oral QPC supper   Continuous Infusions: . amiodarone 30 mg/hr (05/10/14 0925)    Active Problems:   Diabetes mellitus, type 2   Coronary atherosclerosis   Lung cancer, hilus   Dehydration   Acute  respiratory failure with hypoxia   Elevated d-dimer   Tachycardia   Generalized weakness   Orthostatic hypotension   Atrial fibrillation   Protein-calorie malnutrition, severe    Time spent: 30 min    Gregory Farrell, [facility] Pager [phone]. If 7PM-7AM, please contact night-coverage at [email or web address], password [facility] 05/10/2014, 1:27 PM  LOS: 3 days

## 2014-05-10 NOTE — Evaluation (Signed)
Occupational Therapy Evaluation Patient Details Name: Gregory Farrell MRN: [id number] DOB: 01-28-1933 Today's Date: 05/10/2014    History of Present Illness Pt is a 80 yo male admitted with h/o squamous cell Stage IIIB lung CA, COPD, GERD, has been on chemo until this admission, CAD, HTN and DM II. Pt admitted with lightheadedness, weakness and abdominal pain.  Pt found to be orthstatic, dehydtrated and with afib.  Pt also with 2 vessel obstructive coronary disease.     Clinical Impression   Pt admitted with the above diagnosis and has the deficits listed below.  Pt would benefit from cont OT to increase independence with all adls so he can d/c back home with his wife as he continues to fight cancer.  Pt requires encouragement to participate but once engaged does very well and do feel like he can be more I moving forward which is a personal goal for him.  Daughter is in agreement and highly involved in his care.     Follow Up Recommendations  Home health OT;Supervision/Assistance - 24 hour;Other (comment) (may need SNF if does not improve with activity tolerance.)    Equipment Recommendations  None recommended by OT    Recommendations for Other Services       Precautions / Restrictions Precautions Precautions: Fall Restrictions Weight Bearing Restrictions: No Other Position/Activity Restrictions: check orthostatics.      Mobility Bed Mobility Overal bed mobility: Needs Assistance Bed Mobility: Supine to Sit     Supine to sit: HOB elevated;Min assist     General bed mobility comments: did well getting to EOB. Reaches to pull in therapist but when encouraged to do things himself he did very well.  Transfers Overall transfer level: Needs assistance Equipment used: 1 person hand held assist Transfers: Sit to/from Stand Sit to Stand: Min assist         General transfer comment: Cues for hand placement and controlled decent.    Balance Overall balance assessment: Needs  assistance Sitting-balance support: Feet supported Sitting balance-Leahy Scale: Good     Standing balance support: Bilateral upper extremity supported;During functional activity Standing balance-Leahy Scale: Fair Standing balance comment: pt fatigues quickly but did well standing at sink                            ADL Overall ADL's : Needs assistance/impaired Eating/Feeding: Set up;Sitting (encouragement to eat)   Grooming: Wash/dry hands;Oral care;Min guard;Standing Grooming Details (indicate cue type and reason): walked to sink.  Min guard at sink.  No LOB.  First time up with therapy. Upper Body Bathing: Minimal assitance;Sitting Upper Body Bathing Details (indicate cue type and reason): min assist for fatigue. Lower Body Bathing: Moderate assistance;Sit to/from stand Lower Body Bathing Details (indicate cue type and reason): mod assist to stand and maintain balance and assist to reach feet. Upper Body Dressing : Minimal assistance;Sitting Upper Body Dressing Details (indicate cue type and reason): min assist to get arms over head. Lower Body Dressing: Moderate assistance;Sit to/from stand Lower Body Dressing Details (indicate cue type and reason): mod assist to stand to fasten clothes and to donn socks and shoes. Toilet Transfer: Minimal assistance;Ambulation;Grab bars;Comfort height toilet Toilet Transfer Details (indicate cue type and reason): cues  for  hand placement. Toileting- Clothing Manipulation and Hygiene: Minimal assistance;Sit to/from stand Toileting - Clothing Manipulation Details (indicate cue type and reason): min assist for balance.     Functional mobility during ADLs: Minimal assistance;Rolling walker General  ADL Comments: Pt was resistant and afraid to get up but moved very well. Pt is very weak.  Most difficulties are with LE adls due to unability to cross legs up to dress and bathe and with general toleration of adl activity.     Vision                      Perception     Praxis      Pertinent Vitals/Pain Pain Assessment: No/denies pain     Hand Dominance Right   Extremity/Trunk Assessment Upper Extremity Assessment Upper Extremity Assessment: Generalized weakness   Lower Extremity Assessment Lower Extremity Assessment: Defer to PT evaluation   Cervical / Trunk Assessment Cervical / Trunk Assessment: Normal   Communication Communication Communication: No difficulties   Cognition Arousal/Alertness: Awake/alert Behavior During Therapy: WFL for tasks assessed/performed Overall Cognitive Status: Within Functional Limits for tasks assessed                     General Comments       Exercises       Shoulder Instructions      Home Living Family/patient expects to be discharged to:: Private residence Living Arrangements: Spouse/significant other Available Help at Discharge: Family (wife cant do lifting.) Type of Home: House (townhouse) Home Access: Level entry     Home Layout: Able to live on main level with bedroom/bathroom     Bathroom Shower/Tub: Walk-in shower;Other (comment) (actually a walk in rebath bath tub.)   Bathroom Toilet: Standard     Home Equipment: Walker - 2 wheels;Bedside commode   Additional Comments: up until this admit has only had to use the walker outside the house.      Prior Functioning/Environment Level of Independence: Needs assistance  Gait / Transfers Assistance Needed: walked with walker outside the home; otherwise cruised furniture inside. ADL's / Homemaking Assistance Needed: wife does Office manager / Swallowing Assistance Needed: has had issues with swalling due to radiation etc. Comments: able to do most of own adls PTA.    OT Diagnosis: Generalized weakness   OT Problem List: Decreased activity tolerance;Impaired balance (sitting and/or standing);Decreased knowledge of use of DME or AE;Decreased safety awareness;Decreased knowledge of  precautions;Increased edema   OT Treatment/Interventions: Self-care/ADL training;Therapeutic activities;DME and/or AE instruction    OT Goals(Current goals can be found in the care plan section) Acute Rehab OT Goals Patient Stated Goal: to go home and do more for myself. OT Goal Formulation: With patient/family Time For Goal Achievement: 05/24/14 Potential to Achieve Goals: Good ADL Goals Pt Will Perform Grooming: with supervision;standing Pt Will Perform Lower Body Bathing: with min guard assist;sit to/from stand Pt Will Perform Lower Body Dressing: with min guard assist;sit to/from stand Pt Will Transfer to Toilet: with supervision;ambulating Pt Will Perform Toileting - Clothing Manipulation and hygiene: with supervision;sit to/from stand Pt Will Perform Tub/Shower Transfer: with supervision;rolling walker;ambulating  OT Frequency: Min 2X/week   Barriers to D/C: Decreased caregiver support  only has wife to assist at home.       Co-evaluation              End of Session Equipment Utilized During Treatment: Surveyor, mining Communication: Mobility status  Activity Tolerance: Patient limited by fatigue Patient left: in chair;with call bell/phone within reach;with family/visitor present   Time: 1003-1048 OT Time Calculation (min): 45 min Charges:  OT General Charges $OT Visit: 1 Procedure OT Evaluation $Initial OT Evaluation Tier I: 1 Procedure OT  Treatments $Self Care/Home Management : 38-52 mins G-Codes:    Glenford Peers 06/06/2014, 12:03 PM [phone]

## 2014-05-10 NOTE — Consult Note (Signed)
Referring Provider: Dr. Marin Olp Primary Care Physician:  [name], MD Primary Gastroenterologist:  Althia Forts  Reason for Consultation:  Dysphagia  HPI: Gregory Farrell is a 80 y.o. male is a pleasant 80 yo man with squamous cell carcinoma of the lung on chemo/radiation with radiation started several weeks ago. Patient recently on cycle 5 of chemo with concurrent radiation. GI consult due to dysphagia that started approximately 3 weeks ago per Gregory Farrell and his son-in-law that began shortly after initiation of radiation therapy. Denies trouble swallowing food prior to radiation treatment. Describes a severe burning with swallowing liquid or food. Very poor appetite with minimal PO intake per his son-in-law. Episodes of nausea and vomiting recently. Denies abdominal pain. Admitted on 05/07/14 with dizziness and orthostatic hypotension and found to have an arrythmia with atrial fib with RVR. Patient on amiodarone for the arrythmia and plan for anticoagulation (Eliquis) when GI workup is complete. Recent swallowing evaluation ok with no signs of aspiration and diet without restrictions recommended. Had odynophagia and nausea with swallowing pills recently and one of his meds had to be crushed because he was afraid to swallow it because of its size. No history of melena or hematochezia. Patient sitting in bedside chair.   Pancytopenic: Results for CADENCE, HASLAM (MRN [id number]) as of 05/10/2014 18:34  Ref. Range 05/07/2014 10:26 05/08/2014 03:55 05/09/2014 05:03 05/10/2014 04:49  WBC Latest Range: 4.0-10.0 10e3/uL 4.8 2.8 (L) 2.5 (L) 1.9 (L)  RBC Latest Range: 4.22-5.81 MIL/uL 3.74 (L) 3.24 (L) 3.12 (L) 3.07 (L)  Hemoglobin Latest Range: 13.0-17.0 g/dL 11.5 (L) 9.8 (L) 9.5 (L) 9.6 (L)  HCT Latest Range: 39.0-52.0 % 36.2 (L) 31.2 (L) 29.8 (L) 29.8 (L)  MCV Latest Range: 78.0-100.0 fL 96.8 96.3 95.5 97.1  MCH Latest Range: 26.0-34.0 pg 30.7 30.2 30.4 31.3  MCHC Latest Range: 30.0-36.0 g/dL 31.8 31.4 31.9  32.2  RDW Latest Range: 11.5-15.5 % 19.4 (H) 19.4 (H) 19.2 (H) 19.2 (H)  Platelets Latest Range: 150-400 K/uL 140 (L) 127 (L) 102 (L) 111 (L)      Past Medical History  Diagnosis Date  . Hypertension   . COPD (chronic obstructive pulmonary disease)   . GERD (gastroesophageal reflux disease)   . Renal insufficiency   . Hyperlipidemia   . CAD (coronary artery disease)   . B12 deficiency   . Memory loss   . Vertigo   . Diabetes mellitus     type II, poly neuropathy  . Overweight 01/31/2013  . Lung cancer, hilus 03/22/2014    Past Surgical History  Procedure Laterality Date  . Coronary angioplasty with stent placement      more than 10 years ago  . Inguinal hernia repair  2010  . Video bronchoscopy Bilateral 03/07/2014    Procedure: VIDEO BRONCHOSCOPY WITHOUT FLUORO;  Surgeon: Kathee Delton, MD;  Location: [facility];  Service: Cardiopulmonary;  Laterality: Bilateral;    Prior to Admission medications   Medication Sig Start Date End Date Taking? Authorizing Provider  albuterol (PROVENTIL HFA;VENTOLIN HFA) 108 (90 BASE) MCG/ACT inhaler Inhale 2 puffs into the lungs every 6 (six) hours as needed for wheezing or shortness of breath.   Yes Historical Provider, MD  atorvastatin (LIPITOR) 80 MG tablet TAKE ONE TABLET BY MOUTH ONE TIME DAILY  04/08/14  Yes Mosie Lukes, MD  budesonide-formoterol [facility]) 160-4.5 MCG/ACT inhaler Inhale 2 puffs into the lungs 2 (two) times daily.   Yes Historical Provider, MD  citalopram (CELEXA) 20 MG tablet Take 40  mg by mouth daily.   Yes Historical Provider, MD  fentaNYL (DURAGESIC - DOSED MCG/HR) 12 MCG/HR Place 1 patch (12.5 mcg total) onto the skin every 3 (three) days. 04/28/14  Yes Volanda Napoleon, MD  insulin detemir (LEVEMIR) 100 UNIT/ML injection Inject 50 Units into the skin daily. May increase by 2 units every 3 days as directed   Yes Historical Provider, MD  loperamide (IMODIUM) 1 MG/5ML solution Take 10 mLs (2 mg total) by mouth as  needed for diarrhea or loose stools. 04/11/14  Yes Volanda Napoleon, MD  mometasone-formoterol (DULERA) 200-5 MCG/ACT AERO Inhale 2 puffs into the lungs 2 (two) times daily.   Yes Historical Provider, MD  ondansetron (ZOFRAN) 8 MG tablet Take 8 mg by mouth every 8 (eight) hours as needed for nausea or vomiting. Start after Chemo   Yes Historical Provider, MD  pantoprazole (PROTONIX) 40 MG tablet Take 40 mg by mouth 2 (two) times daily.    Yes Historical Provider, MD  prochlorperazine (COMPAZINE) 10 MG tablet Take 10 mg by mouth every 6 (six) hours as needed for nausea or vomiting.   Yes Historical Provider, MD  sucralfate (CARAFATE) 1 GM/10ML suspension Take 10 mLs (1 g total) by mouth 4 (four) times daily -  with meals and at bedtime. 04/05/14  Yes Blair Promise, MD  Wound Cleansers (RADIAPLEX EX) Apply topically.   Yes Historical Provider, MD  lactulose (CHRONULAC) 10 GM/15ML solution Take 30 mLs (20 g total) by mouth 2 (two) times daily. 04/28/14   Volanda Napoleon, MD    Scheduled Meds: . atorvastatin  80 mg Oral q1800  . [START ON 05/11/2014] enoxaparin (LOVENOX) injection  40 mg Subcutaneous Q24H  . feeding supplement (ENSURE)  1 Container Oral TID WC  . feeding supplement (RESOURCE BREEZE)  1 Container Oral TID BM  . fentaNYL  12.5 mcg Transdermal Q72H  . insulin aspart  0-5 Units Subcutaneous QHS  . insulin aspart  0-9 Units Subcutaneous TID WC  . insulin detemir  15 Units Subcutaneous QHS  . lactulose  20 g Oral BID  . metoCLOPramide  10 mg Oral TID AC  . metoprolol  2.5 mg Intravenous 4 times per day  . mirtazapine  15 mg Oral QHS  . mometasone-formoterol  2 puff Inhalation BID  . ondansetron  4 mg Oral 3 times per day  . pantoprazole sodium  40 mg Oral BID  . sodium chloride  3 mL Intravenous Q12H  . sucralfate  1 g Oral TID WC & HS  . tamsulosin  0.4 mg Oral QPC supper   Continuous Infusions: . amiodarone 30 mg/hr (05/10/14 0925)   PRN Meds:.acetaminophen, acetaminophen,  bisacodyl, levalbuterol, LORazepam, ondansetron (ZOFRAN) IV, oxyCODONE, prochlorperazine, sodium chloride  Allergies as of 05/07/2014 - Review Complete 05/07/2014  Allergen Reaction Noted  . Exenatide  03/09/2010    Family History  Problem Relation Age of Onset  . Coronary artery disease Mother   . Breast cancer Mother   . Diabetes Mother   . Leukemia Brother     History   Social History  . Marital Status: Married    Spouse Name: N/A    Number of Children: 1  . Years of Education: N/A   Occupational History  . Retired     [employer]   Social History Main Topics  . Smoking status: Former Smoker -- 1.00 packs/day for 20 years    Types: Cigarettes    Start date: 09/09/1982  Quit date: 07/15/2002  . Smokeless tobacco: Never Used     Comment: quit smoking 11 years ago  . Alcohol Use: No  . Drug Use: Not on file  . Sexual Activity: Not on file   Other Topics Concern  . Not on file   Social History Narrative  . No narrative on file    Review of Systems: All negative from a GI standpoint except as stated above in HPI.  Physical Exam: Vital signs: Filed Vitals:   05/10/14 1700  BP: 121/72  Pulse: 80  Temp: 98.4  Resp: 18   Last BM Date: 05/09/14 General:   Elderly, frail, lethargic, no acute distress HEENT: anicteric Lungs:  Coarse breath sounds Heart:  Regular rate and rhythm Abdomen: soft, nontender, nondistended, +BS  Rectal:  Deferred  GI:  Lab Results:  Recent Labs  05/08/14 0355 05/09/14 0503 05/10/14 0449  WBC 2.8* 2.5* 1.9*  HGB 9.8* 9.5* 9.6*  HCT 31.2* 29.8* 29.8*  PLT 127* 102* 111*   BMET  Recent Labs  05/08/14 0355 05/09/14 0503 05/10/14 0449  NA 136* 135* 137  K 4.9 3.9 3.7  CL 101 101 103  CO2 26 23 24   GLUCOSE 164* 209* 122*  BUN 17 13 9   CREATININE 1.62* 1.35 1.19  CALCIUM 8.0* 7.6* 7.8*   LFT No results found for this basename: PROT, ALBUMIN, AST, ALT, ALKPHOS, BILITOT, BILIDIR, IBILI,  in the last 72  hours PT/INR No results found for this basename: LABPROT, INR,  in the last 72 hours   Studies/Results: No results found.  Impression/Plan: 80 yo with squamous cell lung CA on chemo/radiation admitted for weakness and dizziness thought to be due to paroxysmal Afib, who has been having severe odynophagia since radiation therapy that has worsened. Odynophagia persisting despite PO PPI and Carafate. Patient pancytopenic. EGD needed to evaluate for radiation esophagitis vs. Infectious esophagitis due to need for improvement in PO intake and need for anticoagulation for the Afib. Will change to IV PPI Q 12. Hold tonight's Carafate dose. NPO p MN. Tentatively plan for EGD tomorrow unless patient becomes neutropenic then may have to delay the procedure. Dr. Myrtha Mantis will reevaluate tomorrow and decide on timing of the endoscopy.    LOS: 3 days   Gregory C.  05/10/2014, 6:12 PM

## 2014-05-10 NOTE — Progress Notes (Signed)
    Subjective:  Denies CP or dyspnea   Objective:  Filed Vitals:   05/09/14 1445 05/09/14 2059 05/09/14 2117 05/10/14 0035  BP: 119/67  124/61 125/66  Pulse: 75  81 81  Temp: 98.4 F (36.9 C)  99.1 F (37.3 C)   TempSrc: Oral  Oral   Resp: 20  20   Height:      Weight:      SpO2: 98% 95% 97%     Intake/Output from previous day:  Intake/Output Summary (Last 24 hours) at 05/10/14 1610 Last data filed at 05/09/14 1530  Gross per 24 hour  Intake 1330.1 ml  Output    300 ml  Net 1030.1 ml    Physical Exam: Physical exam: Well-developed well-nourished in no acute distress.  Skin is warm and dry.  HEENT is normal.  Neck is supple.  Chest diffuse rhonchi Cardiovascular exam is irregular Abdominal exam nontender or distended. No masses palpated. Extremities show no edema. neuro grossly intact    Lab Results: Basic Metabolic Panel:  Recent Labs  05/07/14 1026 05/08/14 0355 05/09/14 0503 05/10/14 0449  NA 138 136* 135* 137  K 4.4 4.9 3.9 3.7  CL 100 101 101 103  CO2 25 26 23 24   GLUCOSE 125* 164* 209* 122*  BUN 16 17 13 9   CREATININE 1.77* 1.62* 1.35 1.19  CALCIUM 8.5 8.0* 7.6* 7.8*  MG  --  2.1  --   --    CBC:  Recent Labs  05/07/14 1026  05/09/14 0503 05/10/14 0449  WBC 4.8  < > 2.5* 1.9*  NEUTROABS 4.4  --   --  1.5*  HGB 11.5*  < > 9.5* 9.6*  HCT 36.2*  < > 29.8* 29.8*  MCV 96.8  < > 95.5 97.1  PLT 140*  < > 102* 111*  < > = values in this interval not displayed. Cardiac Enzymes:  Recent Labs  05/07/14 1609 05/07/14 2135 05/08/14 0355  TROPONINI <0.30 <0.30 <0.30     Assessment/Plan:  1 PAF-patient continues to have intermittent atrial fibrillation on telemetry; HR elevated. Plan to continue low-dose metoprolol and IV amiodarone. Hopefully he will hold sinus as more amiodarone gets into his system. Will transition to oral amiodarone later. He has multiple embolic risk factors including age greater than 66, coronary artery disease,  hypertension, diabetes mellitus. He would benefit from long-term anticoagulation. I would recommend apixaban 5 mg by mouth twice a day once it is clear that his platelet count has reached its nadir from recent chemotherapy. Note LV function is normal. Recent TSH normal. 2 coronary artery disease-Once apixaban initiated, would discontinue aspirin. Resume statin at discharge. 3 hypertension-continue present meds 4 lung cancer-management perOncology. Would ask oncology about risk of anticoagulation with ongoing chemotherapy with resultant thrombocytopenia. 5 dehydration-improved  Gregory Farrell 05/10/2014, 6:19 AM

## 2014-05-10 NOTE — Progress Notes (Signed)
PT Cancellation Note  Patient Details Name: Gregory Farrell MRN: [id number] DOB: 08-12-1932   Cancelled Treatment:    Reason Eval/Treat Not Completed: Other (comment)  Attempted x2 this am and pt's family member refused requesting we come back later this pm; pt seemed agreeable, stating he was not overly tired form OT or inpain; RN present and confirmed HR ok during OT earlier this am;  Will check back as schedule permits, have informed pt family this may NOT be today;   [facility] 05/10/2014, 1:43 PM

## 2014-05-10 NOTE — Progress Notes (Signed)
Had a brief episode of atrial fib with RVR HR-150-160's, quickly converted back to NSR. Patient was asymptomatic.

## 2014-05-11 ENCOUNTER — Ambulatory Visit: Payer: Medicare HMO

## 2014-05-11 ENCOUNTER — Telehealth: Payer: Self-pay | Admitting: Cardiology

## 2014-05-11 DIAGNOSIS — M7989 Other specified soft tissue disorders: Secondary | ICD-10-CM

## 2014-05-11 DIAGNOSIS — E119 Type 2 diabetes mellitus without complications: Secondary | ICD-10-CM

## 2014-05-11 DIAGNOSIS — R609 Edema, unspecified: Secondary | ICD-10-CM

## 2014-05-11 LAB — BASIC METABOLIC PANEL
Anion gap: 10 (ref 5–15)
BUN: 9 mg/dL (ref 6–23)
CHLORIDE: 103 meq/L (ref 96–112)
CO2: 24 mEq/L (ref 19–32)
Calcium: 7.9 mg/dL — ABNORMAL LOW (ref 8.4–10.5)
Creatinine, Ser: 1.22 mg/dL (ref 0.50–1.35)
GFR, EST AFRICAN AMERICAN: 63 mL/min — AB (ref >90)
GFR, EST NON AFRICAN AMERICAN: 55 mL/min — AB (ref >90)
GLUCOSE: 116 mg/dL — AB (ref 70–99)
POTASSIUM: 3.8 meq/L (ref 3.7–5.3)
SODIUM: 137 meq/L (ref 137–147)

## 2014-05-11 LAB — BLOOD GAS, VENOUS
Acid-Base Excess: 1.2 mmol/L (ref 0.0–2.0)
BICARBONATE: 24.9 meq/L — AB (ref 20.0–24.0)
FIO2: 0.21 %
O2 Saturation: 64.2 %
PCO2 VEN: 37.3 mmHg — AB (ref 45.0–50.0)
Patient temperature: 97.9
TCO2: 23.2 mmol/L (ref 0–100)
pH, Ven: 7.439 — ABNORMAL HIGH (ref 7.250–7.300)

## 2014-05-11 LAB — CBC WITH DIFFERENTIAL/PLATELET
BASOS PCT: 1 % (ref 0–1)
Basophils Absolute: 0 10*3/uL (ref 0.0–0.1)
EOS PCT: 1 % (ref 0–5)
Eosinophils Absolute: 0 10*3/uL (ref 0.0–0.7)
HCT: 30.5 % — ABNORMAL LOW (ref 39.0–52.0)
HEMOGLOBIN: 9.9 g/dL — AB (ref 13.0–17.0)
Lymphocytes Relative: 21 % (ref 12–46)
Lymphs Abs: 0.3 10*3/uL — ABNORMAL LOW (ref 0.7–4.0)
MCH: 30.6 pg (ref 26.0–34.0)
MCHC: 32.5 g/dL (ref 30.0–36.0)
MCV: 94.1 fL (ref 78.0–100.0)
MONO ABS: 0.1 10*3/uL (ref 0.1–1.0)
Monocytes Relative: 12 % (ref 3–12)
NEUTROS PCT: 65 % (ref 43–77)
Neutro Abs: 0.8 10*3/uL — ABNORMAL LOW (ref 1.7–7.7)
Platelets: 126 10*3/uL — ABNORMAL LOW (ref 150–400)
RBC: 3.24 MIL/uL — ABNORMAL LOW (ref 4.22–5.81)
RDW: 19 % — ABNORMAL HIGH (ref 11.5–15.5)
WBC: 1.2 10*3/uL — AB (ref 4.0–10.5)

## 2014-05-11 LAB — GLUCOSE, CAPILLARY
GLUCOSE-CAPILLARY: 116 mg/dL — AB (ref 70–99)
GLUCOSE-CAPILLARY: 164 mg/dL — AB (ref 70–99)
Glucose-Capillary: 113 mg/dL — ABNORMAL HIGH (ref 70–99)
Glucose-Capillary: 110 mg/dL — ABNORMAL HIGH (ref 70–99)
Glucose-Capillary: 109 mg/dL — ABNORMAL HIGH (ref 70–99)

## 2014-05-11 LAB — PROTIME-INR
INR: 1.16 (ref 0.00–1.49)
PROTHROMBIN TIME: 14.9 s (ref 11.6–15.2)

## 2014-05-11 LAB — HEPARIN LEVEL (UNFRACTIONATED): Heparin Unfractionated: 1.05 IU/mL — ABNORMAL HIGH (ref 0.30–0.70)

## 2014-05-11 MED ORDER — HEPARIN (PORCINE) IN NACL 100-0.45 UNIT/ML-% IJ SOLN
1400.0000 [IU]/h | INTRAMUSCULAR | Status: DC
  Administered 2014-05-11: 1400 [IU]/h via INTRAVENOUS
  Filled 2014-05-11 (×2): qty 250

## 2014-05-11 MED ORDER — HEPARIN (PORCINE) IN NACL 100-0.45 UNIT/ML-% IJ SOLN
1100.0000 [IU]/h | INTRAMUSCULAR | Status: DC
  Administered 2014-05-12: 1100 [IU]/h via INTRAVENOUS
  Filled 2014-05-11: qty 250

## 2014-05-11 MED ORDER — ACETAMINOPHEN 160 MG/5ML PO SOLN
650.0000 mg | Freq: Four times a day (QID) | ORAL | Status: DC | PRN
  Administered 2014-05-11: 650 mg via ORAL
  Filled 2014-05-11: qty 20.3

## 2014-05-11 MED ORDER — FILGRASTIM 480 MCG/1.6ML IJ SOLN
480.0000 ug | Freq: Every day | INTRAMUSCULAR | Status: DC
  Administered 2014-05-11: 480 ug via SUBCUTANEOUS
  Filled 2014-05-11 (×3): qty 1.6

## 2014-05-11 MED ORDER — PANTOPRAZOLE SODIUM 40 MG PO PACK
40.0000 mg | PACK | Freq: Two times a day (BID) | ORAL | Status: DC
  Administered 2014-05-11 – 2014-05-17 (×12): 40 mg via ORAL
  Filled 2014-05-11 (×18): qty 20

## 2014-05-11 MED ORDER — HEPARIN BOLUS VIA INFUSION
3000.0000 [IU] | Freq: Once | INTRAVENOUS | Status: AC
  Administered 2014-05-11: 3000 [IU] via INTRAVENOUS
  Filled 2014-05-11: qty 3000

## 2014-05-11 NOTE — Telephone Encounter (Signed)
Dr Stanford Breed SPOKE TO JESSICA

## 2014-05-11 NOTE — Progress Notes (Signed)
[facility] PROGRESS NOTE  Gregory Farrell TMH:[id number] DOB: May 18, 1933 DOA: 05/07/2014 PCP: [name], MD  Brief Summary  The patient is a 80 y.o. year-old male with history of stage IIIB squamous cell lung cancer for which he has been undergoing chemotherapy and XRT, COPD, GERD, HTN/HLD, CAD s/p stent 10 years ago, T2DM who presented with generalized weakness and lightheadedness.  He completed his chemo and XRT last week but did not come in regularly for IVF despite nausea and poor appetite.  He states approximately 2 weeks ago, he noticed that his heartbeat was beating irregular intermittently.  The morning of admission, he developed slurred speech, clouded thinking, blurred vision, and dizziness. He has had a-fib with RVR and possibly SVT intermittently and has been started on amiodarone per cardiology.  Stroke work up is on hold because he is too kyphotic to fit in [facility] MRI machine and needs to go to the larger MRI machine at [facility], but cannot transfer until his has fewer episodes of tachycardia.  He is not eating and having dysphagia.  He is very weak.    Assessment/Plan  Slurred speech with confusion and blurred vision, now resolved but concerning for possible stroke - Daily ASA  - MRI/MRA brain at [facility] today, ok with cardio.  - failed initial swallow but passed speech therapy's swallow eval on 10/25 - holding  Atorvastatin until able to swallow.  - Continue diabetes management -  Will need carotid duplex if stroke on MRI  Encephalopathy;  Patient more sleepy today, wake up to answer questions, neuro exam non focal.  Received 1 Mg ativan last night. Will remove fentanyl patch.  Blood glucose 110.  Check venous gas for CO2 level.  Frequent neuro check. If continue to be sleep might need CT head.   DVT right subclavian vein and axillary vein:  Heparin per Pharmacy.    Skin burn post radiation;  Wound care consulted.    a-fib with RVR 10/25 that responded to amiodarone  infusion.  Normal systolic function and no major valvular dysfunction on ECHO.   - Cycle troponins:  neg - TSH 1.36 - amio and metoprolol per cardiology -  Would start apixaban and stop aspirin post procedure -  Appreciate cardiology assistance   Dysphagia - Change medications to liquid where possible - GI consult placed by Dr. Marin Olp for endoscopy to rule out stenosis and underlying infectious esophagitis.   -Endoscopy to be done tomorrow depending on white count.   Acute hypoxic respiratory failure, now resolved.  May have been due to COPD exac.  CT angio chest:  Improving left hilar and infrahilar mass. Persistent mild abnormal soft tissue surrounding the left lower bronchus with improved aeration.  No evidence of PE.   - Continue dulera and prn xopenex  Lower extermity edema may be due to diastolic heart failure or DVT in the setting of malignancy  - Lower extremity duplex:  Negative for DVT - ECHO as above  Mild Acute on chronic kidney disease stage 3, likely prerenal given history and resolved with IVF  Acute urinary retention -  Started flomax -  Attempt to remove foley   Constipation, stable, continue lactulose  CAD, chest pain free  - Continue ASA, BB, statin per cardiology  T2DM, CBG now somewhat low today and not eating - A1c 7.6 - hold levemir blood sugar in the 110 range.  -  Continue low dose SSI  Squamous cell lung CA  - Appreciate oncology assistance    Pancytopenia  due to recent chemotherapy - Neupogen daily, started 10-28 -  tx for hgb < 7 or for symptomatic anemia  Severe protein calorie malnutrition -  Started  Mirtazapine, celexa discontinue.  -  Continue supplements and liberalized diet  - Nutrition consultation   Dehydration with orthostatic hypotension, resolved with IVF.    Generalized weakness, likely secondary  - PT/OT    Diet:  regular Access: port  IVF:  Off Proph: lovenox   Code Status: FULL  Family Communication: patient and  daughter.  Disposition Plan:  Continue telemetry   Consultants:  Cardiology  Procedures:  CXR  CT angio chest  Duplex lower extremities  ECHO  MRI/MRA  Antibiotics:  none   HPI/Subjective: Patient notice to be more sleepy today by daughter.  Patient wake up, answer questions appropriately. Follows commands.   Objective: Filed Vitals:   05/10/14 1700 05/10/14 2011 05/10/14 2225 05/11/14 0611  BP: 121/72  100/51 100/57  Pulse: 80  83 81  Temp:   98.5 F (36.9 C) 98.2 F (36.8 C)  TempSrc:   Oral Oral  Resp: 18  20 20   Height:      Weight:    84.3 kg (185 lb 13.6 oz)  SpO2:  97% 95% 95%    Intake/Output Summary (Last 24 hours) at 05/11/14 0952 Last data filed at 05/11/14 0865  Gross per 24 hour  Intake 268.67 ml  Output    870 ml  Net -601.33 ml   Filed Weights   05/09/14 0519 05/10/14 0500 05/11/14 0611  Weight: 82.4 kg (181 lb 10.5 oz) 83.4 kg (183 lb 13.8 oz) 84.3 kg (185 lb 13.6 oz)    Exam:   General:   No acute distress  Cardiovascular:  RRR, nl S1, S2 no mrg, 2+ pulses, warm extremities  Respiratory:   no wheezes or rhonchi, no increased WOB  Abdomen:   NABS, soft, NT/ND  MSK:   Normal tone and bulk, trace bilateral LEE, 1+ swelling RUE (lying in right side)  Neuro:  Grossly moves all extremities, non focal, motor strength 5/5. Follow commands, lethargic, wake up to answer questions.   Data Reviewed: Basic Metabolic Panel:  Recent Labs Lab 05/07/14 1026 05/08/14 0355 05/09/14 0503 05/10/14 0449 05/11/14 0600  NA 138 136* 135* 137 137  K 4.4 4.9 3.9 3.7 3.8  CL 100 101 101 103 103  CO2 25 26 23 24 24   GLUCOSE 125* 164* 209* 122* 116*  BUN 16 17 13 9 9   CREATININE 1.77* 1.62* 1.35 1.19 1.22  CALCIUM 8.5 8.0* 7.6* 7.8* 7.9*  MG  --  2.1  --   --   --    Liver Function Tests:  Recent Labs Lab 05/05/14 1332 05/07/14 1026  AST 19 17  ALT 15 13  ALKPHOS 95* 101  BILITOT 0.70 0.5  PROT 6.3* 6.1  ALBUMIN  --  2.6*   No  results found for this basename: LIPASE, AMYLASE,  in the last 168 hours No results found for this basename: AMMONIA,  in the last 168 hours CBC:  Recent Labs Lab 05/05/14 1331 05/07/14 1026 05/08/14 0355 05/09/14 0503 05/10/14 0449 05/11/14 0600  WBC 3.8* 4.8 2.8* 2.5* 1.9* 1.2*  NEUTROABS 2.9 4.4  --   --  1.5* 0.8*  HGB 12.2* 11.5* 9.8* 9.5* 9.6* 9.9*  HCT 38.2* 36.2* 31.2* 29.8* 29.8* 30.5*  MCV 96 96.8 96.3 95.5 97.1 94.1  PLT 156 140* 127* 102* 111* 126*   Cardiac Enzymes:  Recent Labs Lab 05/07/14 1609 05/07/14 2135 05/08/14 0355  TROPONINI <0.30 <0.30 <0.30   BNP (last 3 results) No results found for this basename: PROBNP,  in the last 8760 hours CBG:  Recent Labs Lab 05/10/14 0740 05/10/14 1146 05/10/14 1702 05/10/14 2224 05/11/14 0724  GLUCAP 128* 121* 181* 119* 113*    No results found for this or any previous visit (from the past 240 hour(s)).   Studies: No results found.  Scheduled Meds: . enoxaparin (LOVENOX) injection  40 mg Subcutaneous Q24H  . feeding supplement (ENSURE)  1 Container Oral TID WC  . feeding supplement (RESOURCE BREEZE)  1 Container Oral TID BM  . fentaNYL  12.5 mcg Transdermal Q72H  . filgrastim  480 mcg Subcutaneous Daily  . insulin aspart  0-5 Units Subcutaneous QHS  . insulin aspart  0-9 Units Subcutaneous TID WC  . insulin detemir  15 Units Subcutaneous QHS  . lactulose  20 g Oral BID  . metoCLOPramide  10 mg Oral TID AC  . metoprolol  2.5 mg Intravenous 4 times per day  . mirtazapine  15 mg Oral QHS  . mometasone-formoterol  2 puff Inhalation BID  . ondansetron  4 mg Oral 3 times per day  . pantoprazole (PROTONIX) IV  40 mg Intravenous Q12H  . sodium chloride  3 mL Intravenous Q12H  . sucralfate  1 g Oral TID WC & HS  . tamsulosin  0.4 mg Oral QPC supper   Continuous Infusions: . sodium chloride 20 mL/hr at 05/10/14 2234  . amiodarone 30 mg/hr (05/11/14 0901)    Active Problems:   Diabetes mellitus, type  2   Coronary atherosclerosis   Lung cancer, hilus   Dehydration   Acute respiratory failure with hypoxia   Elevated d-dimer   Tachycardia   Generalized weakness   Orthostatic hypotension   Atrial fibrillation   Protein-calorie malnutrition, severe    Time spent: 30 min    Joyce Leckey, [facility] Pager [phone]. If 7PM-7AM, please contact night-coverage at [email or web address], password [facility] 05/11/2014, 9:52 AM  LOS: 4 days

## 2014-05-11 NOTE — Progress Notes (Signed)
Called [facility] twice today. The first time called they stated that because the pt had cardiac stents in place, they would need more information about the stents before the pt could have an MRI in the "larger MRI machine." Spoke with pt's family and they did not have the card for the stents, but they reported to RN that the stents were placed at [facility]. The family also stated that the pt had an MRI at [facility] a few months ago because he was not able to have it done at the outpatient facility off of [id number]. Called MRI at [facility] again this evening regarding the information about the stents and this person stated that because the pt has cardiac stents, he cannot go into the "larger MRI machine" at all, regardless of the material of the stents. This person also stated that the "smaller MRI machine at [facility] is the same as the one here at [facility]. This person also found in the pt's chart that when he did have a MRI at [facility] in August, it was of his abdomen which would not have required the "larger MRI machine", and she could see that in August an MRI of the brain was "cancelled" maybe due to the same situation we are in now.  This person reccommended possibly having the test done at [place], phone number [phone], because they have an Open MRI there. [name] does not have an open MRI and the reason pt was going to have the MRI at [facility] is because his neck does not fit in the MRI machine here at [facility], but pt cannot use the "larger MRI at [facility] which may have potentially fit his neck size. This note written to communicate this information to all staff involved with pt's care. Will discuss this with the rounding MD in am.  [name] [facility] 05/11/2014 7:43 PM

## 2014-05-11 NOTE — Progress Notes (Signed)
Dr. Kathline Magic note reviewed.  Patient today is neutropenic, ANC < 1000.  Especially given possible need for biopsies, we will hold off on endoscopy until his [place] is > 1000.  OK to feed today, make NPO after midnight, we can do EGD tomorrow if ANC is > 1000.

## 2014-05-11 NOTE — Progress Notes (Signed)
[facility] for heparin Indication: DVT treatment of RUE/afib  Allergies  Allergen Reactions  . Exenatide     REACTION: nausea    Patient Measurements: Height: 5\' 8"  (172.7 cm) Weight: 185 lb 13.6 oz (84.3 kg) IBW/kg (Calculated) : 68.4 Heparin Dosing Weight:   Vital Signs: Temp: 98.2 F (36.8 C) (10/28 0611) Temp Source: Oral (10/28 1610) BP: 100/57 mmHg (10/28 0611) Pulse Rate: 81 (10/28 0611)  Labs:  Recent Labs  05/09/14 0503 05/10/14 0449 05/11/14 0600  HGB 9.5* 9.6* 9.9*  HCT 29.8* 29.8* 30.5*  PLT 102* 111* 126*  CREATININE 1.35 1.19 1.22    Estimated Creatinine Clearance: 51.9 ml/min (by C-G formula based on Cr of 1.22).   Assessment: 60 YOM admitted with weakness and afib (not currently on therapeutic anticoagulation) and switching from enoxaparin (prophylaxis) to heparin gtt for DVT treatment.   Plans for EGD pending resolution of neutropenia. Currently receiving chemo and radiation for lung cancer.  Today (10/28): duplex of RUE reveals DVT (has implanted port in R chest wall), pharmacy asked to dose heparin gtt.    Scr: 1.22, CrCL ~59 mL/min  CBC Hgb = 9.9, Hct = 30.5, pltc = 126 (low stable)  Goal of Therapy:  Anti-Xa (heparin level) level 0.3-0.7 units/ml  Monitor platelets by anticoagulation protocol: Yes  Plan:   Heparin loading dose: 3000 units x 1   Heparin drip: 1400 units/hr (17 units/kg/hr)  Check heparin level 8h after start of gtt (also check INR)  Daily HL and CBC (watch CBC with chemo 10/22 - carbo/paclitaxel)  Await anticoagulation plans following endoscopy, likely apixaban as per cardiology's note (this was plan prior to discovery of DVT)  Enid Skeens, PharmD Candidate 05/11/14  Doreene Eland, PharmD, BCPS.   Pager: [phone] 05/11/2014 11:41 AM   .d

## 2014-05-11 NOTE — Progress Notes (Signed)
ANTICOAGULATION CONSULT NOTE   Pharmacy Consult for heparin Indication: DVT treatment of RUE/afib  Assessment: 53 YOM admitted with weakness and afib (not currently on therapeutic anticoagulation) and switching from enoxaparin (prophylaxis) to heparin gtt for DVT treatment.   See pharmacist note from earlier today for further detail.  First heparin level supratherapeutic at 1.05 with infusion at 1400 units/hr (following bolus).  Goal of Therapy:  Anti-Xa (heparin level) level 0.3-0.7 units/ml  Monitor platelets by anticoagulation protocol: Yes  Plan:  1.  Hold heparin x 1 hour then resume at reduced rate of 1100 units/hr. 2.  Check heparin level 8 hours following resumption of heparin.  Hershal Coria, PharmD, BCPS Pager: [phone] 05/11/2014 9:47 PM

## 2014-05-11 NOTE — Progress Notes (Signed)
CRITICAL VALUE ALERT  Critical value received:  WBC 1.2  Date of notification:  05/11/2014  Time of notification:  0620  Critical value read back:Yes.    Nurse who received alert:  Daleen Bo RN  MD notified (1st page):  Lamar Blinks NP  Time of first page:  [phone]  MD notified (2nd page):  Time of second page:  Responding MD:  Lamar Blinks NP  Time MD responded:  [phone]

## 2014-05-11 NOTE — Progress Notes (Signed)
Mr. Ganas is about the same. He still is not eating much. I do appreciate gastroenterology's help. They will do a upper endoscopy today.  His white cell count is on the low side. I will put him on Neupogen. This will help with any potential infections. He should have no problems with the upper endoscopy.  Now he cannot urinate. I'm not sure as to why this is. It might be just lack of activity. He is getting physical therapy. It looks like they tried to work with him but his family refused. He may have done some occupational therapy.  He's not had a bowel movement in a couple days. He now is on Reglan.  His blood sugars have been doing okay.  He is only on prophylactic dose of Lovenox.  He is on amiodarone.  He has more swelling in his right arm. I will get a Doppler to make sure that there is no blood clot there. It would not surprise me if he had one because of his inactivity, Port-A-Cath, and malignancy.  His vital signs are pretty stable. His pulse is 81. He is afebrile. Blood pressure 100/57. His lungs are clear. He has good air movement bilaterally. Oral exam shows no obvious mucositis. Cardiac exam is regular rate and rhythm. Abdomen is soft. He has some bowel sounds. There is no guarding or rebound tenderness. He has no palpable liver or spleen tip. Extremities shows the swelling in his right arm.  We will see what the endoscopy shows.  We will get the Doppler test of his right arm.  It is hard to say if depression as a factor. He definitely has a flat affect.  Gregory E.  1 Timothy: 4:10

## 2014-05-11 NOTE — Progress Notes (Signed)
VASCULAR LAB PRELIMINARY  PRELIMINARY  PRELIMINARY  PRELIMINARY  Right upper extremity venous duplex completed.    Preliminary report:  Right:  DVT noted in the subclavian v, axillary vein.  Superficial thrombosis noted in the cephalic vein at the shoulder.      Josiyah Tozzi, RVT 05/11/2014, 9:25 AM

## 2014-05-11 NOTE — Telephone Encounter (Signed)
Janett Billow called in inquiring about a MRI that the pt will be having done today. Please call  Thanks

## 2014-05-11 NOTE — Consult Note (Signed)
WOC wound consult note Reason for Consult:Thermal injury to mid-back secondary to radiation therapy; dry cell desquamation. Wound type:Thermal Pressure Ulcer POA: No Measurement:14cm x 11cm.  Small area of partial thickness tissue loss (0.2 x 0.2cm x 0.2cm) noted in center. Wound PHX:TAVWP, pink, moist Drainage (amount, consistency, odor) none Periwound: discolored deep red, warm to touch. Dressing procedure/placement/frequency: A soft silicone foam dressing will seal, pad and protect the affected tissue. Change twice weekly until tissue recovers. [facility] nursing team will not follow, but will remain available to this patient, the nursing and medical teams.  Please re-consult if needed. Thanks, Maudie Flakes, MSN, RN, [place], [place], [place] [phone])

## 2014-05-11 NOTE — Progress Notes (Signed)
    Subjective:  Denies CP or dyspnea   Objective:  Filed Vitals:   05/10/14 1700 05/10/14 2011 05/10/14 2225 05/11/14 0611  BP: 121/72  100/51 100/57  Pulse: 80  83 81  Temp:   98.5 F (36.9 C) 98.2 F (36.8 C)  TempSrc:   Oral Oral  Resp: 18  20 20   Height:      Weight:    185 lb 13.6 oz (84.3 kg)  SpO2:  97% 95% 95%    Intake/Output from previous day:  Intake/Output Summary (Last 24 hours) at 05/11/14 0726 Last data filed at 05/11/14 4166  Gross per 24 hour  Intake 328.67 ml  Output    870 ml  Net -541.33 ml    Physical Exam: Physical exam: Well-developed well-nourished in no acute distress.  Skin is warm and dry.  HEENT is normal.  Neck is supple.  Chest CTA Cardiovascular exam is irregular Abdominal exam nontender or distended. No masses palpated. Extremities show 2+ edema RUE. neuro grossly intact    Lab Results: Basic Metabolic Panel:  Recent Labs  05/10/14 0449 05/11/14 0600  NA 137 137  K 3.7 3.8  CL 103 103  CO2 24 24  GLUCOSE 122* 116*  BUN 9 9  CREATININE 1.19 1.22  CALCIUM 7.8* 7.9*   CBC:  Recent Labs  05/10/14 0449 05/11/14 0600  WBC 1.9* 1.2*  NEUTROABS 1.5* 0.8*  HGB 9.6* 9.9*  HCT 29.8* 30.5*  MCV 97.1 94.1  PLT 111* 126*   Cardiac Enzymes: No results found for this basename: CKTOTAL, CKMB, CKMBINDEX, TROPONINI,  in the last 72 hours   Assessment/Plan:  1 PAF-patient continues to have intermittent atrial fibrillation on telemetry; HR elevated. Plan to continue low-dose metoprolol and IV amiodarone. Hopefully he will hold sinus as more amiodarone gets into his system. Will transition to oral amiodarone later. He has multiple embolic risk factors including age greater than 42, coronary artery disease, hypertension, diabetes mellitus. He would benefit from long-term anticoagulation. I would recommend apixaban 5 mg by mouth twice a day once it is clear that his platelet count has reached its nadir from recent chemotherapy.  Note LV function is normal. Recent TSH normal. 2 coronary artery disease-Once apixaban initiated, would discontinue aspirin. Resume statin at discharge. 3 hypertension-continue present meds 4 lung cancer-management perOncology. Would ask oncology about risk of anticoagulation with ongoing chemotherapy with resultant thrombocytopenia. 5 dehydration-improved 6 RUE edema-doppler to exclude DVT has been ordered  Kirk Ruths 05/11/2014, 7:26 AM

## 2014-05-12 ENCOUNTER — Ambulatory Visit: Payer: Medicare HMO | Admitting: Hematology & Oncology

## 2014-05-12 ENCOUNTER — Ambulatory Visit: Payer: Medicare HMO

## 2014-05-12 ENCOUNTER — Encounter (HOSPITAL_COMMUNITY)
Admission: EM | Disposition: A | Discharge status: Home / Self Care | Payer: Self-pay | Source: Home / Self Care | Attending: Internal Medicine

## 2014-05-12 ENCOUNTER — Other Ambulatory Visit: Payer: Medicare HMO | Admitting: Lab

## 2014-05-12 DIAGNOSIS — I82621 Acute embolism and thrombosis of deep veins of right upper extremity: Secondary | ICD-10-CM

## 2014-05-12 DIAGNOSIS — R630 Anorexia: Secondary | ICD-10-CM

## 2014-05-12 DIAGNOSIS — E11339 Type 2 diabetes mellitus with moderate nonproliferative diabetic retinopathy without macular edema: Secondary | ICD-10-CM

## 2014-05-12 DIAGNOSIS — R1310 Dysphagia, unspecified: Secondary | ICD-10-CM

## 2014-05-12 HISTORY — PX: ESOPHAGOGASTRODUODENOSCOPY: SHX5428

## 2014-05-12 LAB — IRON AND TIBC
Iron: 49 ug/dL (ref 42–135)
Saturation Ratios: 37 % (ref 20–55)
TIBC: 133 ug/dL — AB (ref 215–435)
UIBC: 84 ug/dL — ABNORMAL LOW (ref 125–400)

## 2014-05-12 LAB — CBC WITH DIFFERENTIAL/PLATELET
BASOS ABS: 0 10*3/uL (ref 0.0–0.1)
Basophils Relative: 1 % (ref 0–1)
EOS PCT: 1 % (ref 0–5)
Eosinophils Absolute: 0 10*3/uL (ref 0.0–0.7)
HCT: 29.5 % — ABNORMAL LOW (ref 39.0–52.0)
Hemoglobin: 9.5 g/dL — ABNORMAL LOW (ref 13.0–17.0)
Lymphocytes Relative: 8 % — ABNORMAL LOW (ref 12–46)
Lymphs Abs: 0.3 10*3/uL — ABNORMAL LOW (ref 0.7–4.0)
MCH: 30.7 pg (ref 26.0–34.0)
MCHC: 32.2 g/dL (ref 30.0–36.0)
MCV: 95.5 fL (ref 78.0–100.0)
MONO ABS: 0.4 10*3/uL (ref 0.1–1.0)
Monocytes Relative: 11 % (ref 3–12)
NEUTROS PCT: 79 % — AB (ref 43–77)
Neutro Abs: 3.1 10*3/uL (ref 1.7–7.7)
Platelets: 116 10*3/uL — ABNORMAL LOW (ref 150–400)
RBC: 3.09 MIL/uL — AB (ref 4.22–5.81)
RDW: 19.2 % — ABNORMAL HIGH (ref 11.5–15.5)
WBC: 3.8 10*3/uL — AB (ref 4.0–10.5)

## 2014-05-12 LAB — GLUCOSE, CAPILLARY
Glucose-Capillary: 126 mg/dL — ABNORMAL HIGH (ref 70–99)
Glucose-Capillary: 127 mg/dL — ABNORMAL HIGH (ref 70–99)
Glucose-Capillary: 132 mg/dL — ABNORMAL HIGH (ref 70–99)
Glucose-Capillary: 141 mg/dL — ABNORMAL HIGH (ref 70–99)
Glucose-Capillary: 160 mg/dL — ABNORMAL HIGH (ref 70–99)

## 2014-05-12 LAB — BASIC METABOLIC PANEL
Anion gap: 10 (ref 5–15)
BUN: 8 mg/dL (ref 6–23)
CO2: 25 mEq/L (ref 19–32)
Calcium: 7.9 mg/dL — ABNORMAL LOW (ref 8.4–10.5)
Chloride: 100 mEq/L (ref 96–112)
Creatinine, Ser: 1.27 mg/dL (ref 0.50–1.35)
GFR calc Af Amer: 60 mL/min — ABNORMAL LOW (ref >90)
GFR calc non Af Amer: 52 mL/min — ABNORMAL LOW (ref >90)
Glucose, Bld: 134 mg/dL — ABNORMAL HIGH (ref 70–99)
POTASSIUM: 3.3 meq/L — AB (ref 3.7–5.3)
SODIUM: 135 meq/L — AB (ref 137–147)

## 2014-05-12 LAB — HEPARIN LEVEL (UNFRACTIONATED)
Heparin Unfractionated: 0.97 IU/mL — ABNORMAL HIGH (ref 0.30–0.70)
Heparin Unfractionated: 0.32 IU/mL (ref 0.30–0.70)

## 2014-05-12 LAB — FERRITIN: Ferritin: 1797 ng/mL — ABNORMAL HIGH (ref 22–322)

## 2014-05-12 SURGERY — EGD (ESOPHAGOGASTRODUODENOSCOPY)
Anesthesia: Moderate Sedation

## 2014-05-12 MED ORDER — MIDAZOLAM HCL 10 MG/2ML IJ SOLN
INTRAMUSCULAR | Status: DC | PRN
  Administered 2014-05-12 (×2): 1 mg via INTRAVENOUS

## 2014-05-12 MED ORDER — HEPARIN (PORCINE) IN NACL 100-0.45 UNIT/ML-% IJ SOLN
850.0000 [IU]/h | INTRAMUSCULAR | Status: AC
  Administered 2014-05-12: 850 [IU]/h via INTRAVENOUS
  Filled 2014-05-12 (×2): qty 250

## 2014-05-12 MED ORDER — FENTANYL CITRATE 0.05 MG/ML IJ SOLN
INTRAMUSCULAR | Status: AC
  Filled 2014-05-12: qty 2

## 2014-05-12 MED ORDER — FENTANYL 12 MCG/HR TD PT72: 12.5000 ug | MEDICATED_PATCH | TRANSDERMAL | Status: DC

## 2014-05-12 MED ORDER — ZOLPIDEM TARTRATE 5 MG PO TABS
5.0000 mg | ORAL_TABLET | Freq: Once | ORAL | Status: AC
  Administered 2014-05-12: 5 mg via ORAL
  Filled 2014-05-12: qty 1

## 2014-05-12 MED ORDER — BUTAMBEN-TETRACAINE-BENZOCAINE 2-2-14 % EX AERO
INHALATION_SPRAY | CUTANEOUS | Status: DC | PRN
  Administered 2014-05-12: 1 via TOPICAL

## 2014-05-12 MED ORDER — FENTANYL 12 MCG/HR TD PT72
12.5000 ug | MEDICATED_PATCH | TRANSDERMAL | Status: DC
  Administered 2014-05-12: 12.5 ug via TRANSDERMAL
  Filled 2014-05-12: qty 1

## 2014-05-12 MED ORDER — FENTANYL CITRATE 0.05 MG/ML IJ SOLN
INTRAMUSCULAR | Status: DC | PRN
  Administered 2014-05-12: 25 ug via INTRAVENOUS

## 2014-05-12 MED ORDER — MIDAZOLAM HCL 10 MG/2ML IJ SOLN
INTRAMUSCULAR | Status: AC
  Filled 2014-05-12: qty 2

## 2014-05-12 MED ORDER — POTASSIUM CHLORIDE 10 MEQ/100ML IV SOLN
10.0000 meq | INTRAVENOUS | Status: AC
  Administered 2014-05-12 (×2): 10 meq via INTRAVENOUS
  Filled 2014-05-12 (×2): qty 100

## 2014-05-12 NOTE — Interval H&P Note (Signed)
History and Physical Interval Note:  05/12/2014 10:28 AM  Gregory Farrell  has presented today for surgery, with the diagnosis of dysphagia  The various methods of treatment have been discussed with the patient and family. After consideration of risks, benefits and other options for treatment, the patient has consented to  Procedure(s): ESOPHAGOGASTRODUODENOSCOPY (EGD) (N/A) as a surgical intervention .  The patient's history has been reviewed, patient examined, no change in status, stable for surgery.  I have reviewed the patient's chart and labs.  Questions were answered to the patient's satisfaction.     Gregory Farrell M  Assessment:  1.  Dysphagia and odynophagia in setting of radiation therapy for lung cancer.  Plan:  1.  Endoscopy. 2.  Risks (bleeding, infection, bowel perforation that could require surgery, sedation-related changes in cardiopulmonary systems), benefits (identification and possible treatment of source of symptoms, exclusion of certain causes of symptoms), and alternatives (watchful waiting, radiographic imaging studies, empiric medical treatment) of upper endoscopy (EGD) were explained to patient/family in detail and patient wishes to proceed.

## 2014-05-12 NOTE — Progress Notes (Signed)
The upper endoscopy was not done yesterday. His white cell count was a little too low. Today, his white cell count is 3.8. It is good for a upper endoscopy. He still is having issues with odynophagia. He just is afraid of eating because of throwing up. He feels that if he eats, it will come back".  He does have a thrombus in the right arm. This certainly could be related to his Port-A-Cath. I will keep the Port-A-Cath in. We will get him on a heparin while he is an inpatient. He can then be converted over to an oral agent.  I think that a calorie count will help [place]. I still don't think he is eating all that much.  I will cancel the MRI. I don't think he's had any kind of stroke. His mental state is pretty much baseline now.  He's had no obvious bleeding.  He really needs physical therapy. I told him that he really has to participate in the physical therapy so we can try to get him better. I think if he is more active, he may eat better.  I will try him on some low-dose Marinol to try to help with his appetite.  His hemoglobin is stable. Platelet count is stable.  His vital signs are fairly good. He sees be in normal sinus rhythm. He continues on the amiodarone drip. His blood pressure is a little on the lower side.  His lungs sound clear. Oral exam shows no mucositis. Cardiac exam is regular rate and rhythm. Abdomen is soft. His bowel sounds are slightly decreased. Extremities shows some slightly improved improved edema of the right arm.  I really think the upper endoscopy will be very helpful.  He still has a ways to go. This is really all about his nutrition.  I appreciate all the great care that he is getting up on 4 W.  Gregory E.  Hebrews 6:10

## 2014-05-12 NOTE — Progress Notes (Signed)
Please hang calorie count envelope on the patient's door. Document percent consumed for each item on the patient's meal tray ticket and keep in envelope. Also document percent of any supplement or snack pt consumes and keep documentation in envelope for RD to review.    Atlee Abide MS RD LDN Clinical Dietitian [phone]

## 2014-05-12 NOTE — Progress Notes (Signed)
[facility] PROGRESS NOTE  Gregory Farrell MHW:[id number] DOB: May 11, 1933 DOA: 05/07/2014 PCP: [name], MD  Brief Summary  The patient is a 80 y.o. year-old male with history of stage IIIB squamous cell lung cancer for which he has been undergoing chemotherapy and XRT, COPD, GERD, HTN/HLD, CAD s/p stent 10 years ago, T2DM who presented with generalized weakness and lightheadedness.  He completed his chemo and XRT last week but did not come in regularly for IVF despite nausea and poor appetite.  He states approximately 2 weeks ago, he noticed that his heartbeat was beating irregular intermittently.  The morning of admission, he developed slurred speech, clouded thinking, blurred vision, and dizziness. He has had a-fib with RVR and possibly SVT intermittently and has been started on amiodarone per cardiology.  Stroke work up is on hold because he is too kyphotic to fit in [facility] MRI machine and needs to go to the larger MRI machine at [facility], but cannot transfer until his has fewer episodes of tachycardia.  He is not eating and having dysphagia.  He is very weak.    Assessment/Plan  Slurred speech with confusion and blurred vision, now resolved but concerning for possible stroke - Daily ASA  - MRI/MRA brain at [facility] today, ok with cardio.  - failed initial swallow but passed speech therapy's swallow eval on 10/25 - holding  Atorvastatin until able to swallow.  - Continue diabetes management -  Unable to do MRI. Daughter does not want to proceed with CT head, and or MRI.   Encephalopathy; resolved likely related to ativan.  Patient more sleepy today, wake up to answer questions, neuro exam non focal.  Received 1 Mg ativan last night. Will remove fentanyl patch.  Blood glucose 110.   DVT right subclavian vein and axillary vein:  Heparin per Pharmacy.  Transition to eliquis 10-30 if tolerating  oral.   Skin burn post radiation;  Wound care consulted.    a-fib with RVR 10/25 that responded to  amiodarone infusion.  Normal systolic function and no major valvular dysfunction on ECHO.   - Cycle troponins:  neg - TSH 1.36 - amio and metoprolol per cardiology -  Would start apixaban and stop aspirin post procedure -  Appreciate cardiology assistance   Dysphagia; post radiation esophagitis.  - Change medications to liquid where possible - GI consult placed by Dr. Marin Olp for endoscopy to rule out stenosis and underlying infectious esophagitis.   -Endoscopy show esophagitis.  -Carafate , PPI.   Acute hypoxic respiratory failure, now resolved.  May have been due to COPD exac.  CT angio chest:  Improving left hilar and infrahilar mass. Persistent mild abnormal soft tissue surrounding the left lower bronchus with improved aeration.  No evidence of PE.   - Continue dulera and prn xopenex  Lower extermity edema may be due to diastolic heart failure or DVT in the setting of malignancy  - Lower extremity duplex:  Negative for DVT - ECHO as above  Mild Acute on chronic kidney disease stage 3, likely prerenal given history and resolved with IVF  Acute urinary retention -  Started flomax - Bladder scan PRN.   Constipation, stable, continue lactulose  CAD, chest pain free  - Continue ASA, BB, statin per cardiology  T2DM, CBG now somewhat low today and not eating - A1c 7.6 - hold levemir blood sugar in the 110 range.  -  Continue low dose SSI  Squamous cell lung CA  - Appreciate oncology assistance  Pancytopenia due to recent chemotherapy - Neupogen daily, started 10-28 -  tx for hgb < 7 or for symptomatic anemia  Severe protein calorie malnutrition -  Started  Mirtazapine, celexa discontinue.  -  Continue supplements and liberalized diet  - Nutrition consultation   Dehydration with orthostatic hypotension, resolved with IVF.    Generalized weakness, likely secondary  - PT/OT    Diet:  regular Access: port  IVF:  Off Proph: lovenox   Code Status: FULL  Family  Communication: patient and daughter.  Disposition Plan:  Continue telemetry   Consultants:  Cardiology  Procedures:  CXR  CT angio chest  Duplex lower extremities  ECHO  MRI/MRA  Antibiotics:  none   HPI/Subjective: Patient more alert today. Answering questions. No complaints.   Objective: Filed Vitals:   05/12/14 1105 05/12/14 1115 05/12/14 1120 05/12/14 1451  BP: 104/63 103/53 109/56 92/49  Pulse: 68 67 65 76  Temp:  97.9 F (36.6 C)  98.7 F (37.1 C)  TempSrc:    Oral  Resp: 19 17 15 16   Height:      Weight:      SpO2: 95% 97% 97% 99%    Intake/Output Summary (Last 24 hours) at 05/12/14 1654 Last data filed at 05/12/14 1452  Gross per 24 hour  Intake 1135.5 ml  Output   1425 ml  Net -289.5 ml   Filed Weights   05/10/14 0500 05/11/14 0611 05/12/14 0500  Weight: 83.4 kg (183 lb 13.8 oz) 84.3 kg (185 lb 13.6 oz) 83.7 kg (184 lb 8.4 oz)    Exam:   General:   No acute distress  Cardiovascular:  RRR, nl S1, S2 no mrg, 2+ pulses, warm extremities  Respiratory:   no wheezes or rhonchi, no increased WOB  Abdomen:   NABS, soft, NT/ND  MSK:   Normal tone and bulk, trace bilateral LEE, 1+ swelling RUE (lying in right side)  Neuro: non focal.   Data Reviewed: Basic Metabolic Panel:  Recent Labs Lab 05/07/14 1026 05/08/14 0355 05/09/14 0503 05/10/14 0449 05/11/14 0600 05/12/14 0545  NA 138 136* 135* 137 137 135*  K 4.4 4.9 3.9 3.7 3.8 3.3*  CL 100 101 101 103 103 100  CO2 25 26 23 24 24 25   GLUCOSE 125* 164* 209* 122* 116* 134*  BUN 16 17 13 9 9 8   CREATININE 1.77* 1.62* 1.35 1.19 1.22 1.27  CALCIUM 8.5 8.0* 7.6* 7.8* 7.9* 7.9*  MG  --  2.1  --   --   --   --    Liver Function Tests:  Recent Labs Lab 05/07/14 1026  AST 17  ALT 13  ALKPHOS 101  BILITOT 0.5  PROT 6.1  ALBUMIN 2.6*   No results found for this basename: LIPASE, AMYLASE,  in the last 168 hours No results found for this basename: AMMONIA,  in the last 168  hours CBC:  Recent Labs Lab 05/07/14 1026 05/08/14 0355 05/09/14 0503 05/10/14 0449 05/11/14 0600 05/12/14 0545  WBC 4.8 2.8* 2.5* 1.9* 1.2* 3.8*  NEUTROABS 4.4  --   --  1.5* 0.8* 3.1  HGB 11.5* 9.8* 9.5* 9.6* 9.9* 9.5*  HCT 36.2* 31.2* 29.8* 29.8* 30.5* 29.5*  MCV 96.8 96.3 95.5 97.1 94.1 95.5  PLT 140* 127* 102* 111* 126* 116*   Cardiac Enzymes:  Recent Labs Lab 05/07/14 1609 05/07/14 2135 05/08/14 0355  TROPONINI <0.30 <0.30 <0.30   BNP (last 3 results) No results found for this basename: PROBNP,  in the last 8760 hours CBG:  Recent Labs Lab 05/12/14 0023 05/12/14 0426 05/12/14 0740 05/12/14 1153 05/12/14 1628  GLUCAP 141* 127* 132* 126* 160*    No results found for this or any previous visit (from the past 240 hour(s)).   Studies: No results found.  Scheduled Meds: . feeding supplement (ENSURE)  1 Container Oral TID WC  . feeding supplement (RESOURCE BREEZE)  1 Container Oral TID BM  . fentaNYL  12.5 mcg Transdermal Q72H  . filgrastim  480 mcg Subcutaneous Daily  . insulin aspart  0-5 Units Subcutaneous QHS  . insulin aspart  0-9 Units Subcutaneous TID WC  . lactulose  20 g Oral BID  . metoCLOPramide  10 mg Oral TID AC  . metoprolol  2.5 mg Intravenous 4 times per day  . mirtazapine  15 mg Oral QHS  . mometasone-formoterol  2 puff Inhalation BID  . ondansetron  4 mg Oral 3 times per day  . pantoprazole sodium  40 mg Oral BID  . potassium chloride  10 mEq Intravenous Q1 Hr x 2  . sodium chloride  3 mL Intravenous Q12H  . sucralfate  1 g Oral TID WC & HS  . tamsulosin  0.4 mg Oral QPC supper   Continuous Infusions: . amiodarone 30 mg/hr (05/12/14 0821)  . heparin 850 Units/hr (05/12/14 9798)    Active Problems:   Diabetes mellitus, type 2   Coronary atherosclerosis   Lung cancer, hilus   Dehydration   Acute respiratory failure with hypoxia   Elevated d-dimer   Tachycardia   Generalized weakness   Orthostatic hypotension   Atrial  fibrillation   Protein-calorie malnutrition, severe    Time spent: 30 min    Lathaniel Legate, [facility] Pager [phone]. If 7PM-7AM, please contact night-coverage at [email or web address], password [email or web address] 05/12/2014, 4:54 PM  LOS: 5 days

## 2014-05-12 NOTE — Progress Notes (Addendum)
    Subjective:  Denies CP or dyspnea   Objective:  Filed Vitals:   05/11/14 2144 05/12/14 0500 05/12/14 0642 05/12/14 0742  BP:   92/52   Pulse:   65   Temp:   98 F (36.7 C)   TempSrc:   Oral   Resp:   20   Height:      Weight:  184 lb 8.4 oz (83.7 kg)    SpO2: 96%  96% 95%    Intake/Output from previous day:  Intake/Output Summary (Last 24 hours) at 05/12/14 0803 Last data filed at 05/12/14 0700  Gross per 24 hour  Intake 1205.5 ml  Output    575 ml  Net  630.5 ml    Physical Exam: Physical exam: Well-developed well-nourished in no acute distress.  Skin is warm and dry.  HEENT is normal.  Neck is supple.  Chest CTA Cardiovascular exam is RRR Abdominal exam nontender or distended. No masses palpated. Extremities show 2+ edema RUE. neuro grossly intact    Lab Results: Basic Metabolic Panel:  Recent Labs  05/11/14 0600 05/12/14 0545  NA 137 135*  K 3.8 3.3*  CL 103 100  CO2 24 25  GLUCOSE 116* 134*  BUN 9 8  CREATININE 1.22 1.27  CALCIUM 7.9* 7.9*   CBC:  Recent Labs  05/11/14 0600 05/12/14 0545  WBC 1.2* 3.8*  NEUTROABS 0.8* 3.1  HGB 9.9* 9.5*  HCT 30.5* 29.5*  MCV 94.1 95.5  PLT 126* 116*     Assessment/Plan:  1 PAF-patient in sinus this AM (occasional junctional rhythm). Plan to continue low-dose metoprolol and IV amiodarone. Will transition to oral amiodarone later when he is able to tolerate (for EGD today). He has multiple embolic risk factors including age greater than 72, coronary artery disease, hypertension, diabetes mellitus. Would change to apixaban once all procedures complete. Note LV function is normal. Recent TSH normal. 2 coronary artery disease-Once apixaban initiated, would discontinue aspirin. Resume statin at discharge. 3 hypertension-continue present meds 4 lung cancer-management perOncology.  5 dehydration-improved 6 RUE DVT-on IV heparin; change to apixaban once all procedures complete.  Kirk Ruths 05/12/2014, 8:03 AM

## 2014-05-12 NOTE — Evaluation (Signed)
Physical Therapy Evaluation Patient Details Name: ORBIE GRUPE MRN: [id number] DOB: 1933/05/26 Today's Date: 05/12/2014   History of Present Illness  Pt is a 80 yo male admitted with h/o squamous cell Stage IIIB lung CA, COPD, GERD, has been on chemo until this admission, CAD, HTN and DM II. Pt admitted with lightheadedness, weakness and abdominal pain.  Pt found to be orthstatic, dehydtrated and with afib.  Pt also with 2 vessel obstructive coronary disease.    Clinical Impression  Pt somewhat upset about having so many visits/interruptions this am. He was not particularly pleased with working with PT at this time however he did agree to do so. (I did give him an option of me checking back later this am however he did begrudgingly agree to participate at this time). On eval, pt required Min-Mod assist for mobility-able to ambulate ~90 feet with rolling walker. Demonstrates general weakness, decreased activity tolerance, and impaired gait and balance. Will likely require 24 hour assist at discharge. Also explained to pt that therapy will try to see him daily, if able,  however I cannot guarantee this (especially over with weekend).     Follow Up Recommendations Home health PT;Supervision/Assistance - 24 hour (may need SNF if mobility/balance/tolerance don't improve)    Equipment Recommendations  None recommended by PT    Recommendations for Other Services OT consult     Precautions / Restrictions Precautions Precautions: Fall Restrictions Weight Bearing Restrictions: No      Mobility  Bed Mobility Overal bed mobility: Needs Assistance Bed Mobility: Supine to Sit;Sit to Supine     Supine to sit: HOB elevated;Min guard Sit to supine: Min guard   General bed mobility comments: close guard for safety. Increased time.   Transfers Overall transfer level: Needs assistance Equipment used: Rolling walker (2 wheeled) Transfers: Sit to/from Stand Sit to Stand: Mod assist          General transfer comment: assist to rise, stabilize, control descent. VCS safety, technique, hand placement  Ambulation/Gait Ambulation/Gait assistance: Min assist Ambulation Distance (Feet): 90 Feet Assistive device: Rolling walker (2 wheeled) Gait Pattern/deviations: Step-through pattern;Decreased stride length     General Gait Details: Unsteady. Assist to stabilize. Tolerated activity fairly well.   Stairs            Wheelchair Mobility    Modified Rankin (Stroke Patients Only)       Balance Overall balance assessment: Needs assistance   Sitting balance-Leahy Scale: Good     Standing balance support: During functional activity Standing balance-Leahy Scale: Poor                               Pertinent Vitals/Pain Pain Assessment: No/denies pain    Home Living Family/patient expects to be discharged to:: Private residence Living Arrangements: Spouse/significant other Available Help at Discharge: Family Type of Home: House Home Access: Level entry     Home Layout: Able to live on main level with bedroom/bathroom Home Equipment: Walker - 2 wheels;Bedside commode Additional Comments: up until this admit has only had to use the walker outside the house.    Prior Function Level of Independence: Needs assistance   Gait / Transfers Assistance Needed: walked with walker outside the home; otherwise cruised furniture inside.  ADL's / Homemaking Assistance Needed: wife does homemaking  Comments: able to do most of own adls PTA.     Hand Dominance        Extremity/Trunk  Assessment   Upper Extremity Assessment: Defer to OT evaluation           Lower Extremity Assessment: Generalized weakness      Cervical / Trunk Assessment: Normal  Communication   Communication: No difficulties  Cognition Arousal/Alertness: Awake/alert Behavior During Therapy: WFL for tasks assessed/performed Overall Cognitive Status: Within Functional Limits for  tasks assessed                      General Comments      Exercises General Exercises - Lower Extremity Quad Sets: AROM;Both;10 reps;Supine Long Arc Quad: AROM;Both;10 reps;Seated Hip ABduction/ADduction: AROM;Both;10 reps;Supine Hip Flexion/Marching: AROM;Both;10 reps;Seated      Assessment/Plan    PT Assessment Patient needs continued PT services  PT Diagnosis Difficulty walking;Generalized weakness   PT Problem List Decreased strength;Decreased activity tolerance;Decreased balance;Decreased mobility;Obesity;Decreased knowledge of use of DME  PT Treatment Interventions DME instruction;Gait training;Functional mobility training;Therapeutic activities;Therapeutic exercise;Patient/family education;Balance training   PT Goals (Current goals can be found in the Care Plan section) Acute Rehab PT Goals Patient Stated Goal: to rest! PT Goal Formulation: With patient Time For Goal Achievement: 05/26/14 Potential to Achieve Goals: Good    Frequency Min 3X/week (more if able)   Barriers to discharge        Co-evaluation               End of Session Equipment Utilized During Treatment: Gait belt Activity Tolerance: Patient tolerated treatment well Patient left: with call bell/phone within reach;with bed alarm set           Time: 7672-0947 PT Time Calculation (min): 17 min   Charges:   PT Evaluation $Initial PT Evaluation Tier I: 1 Procedure PT Treatments $Gait Training: 8-22 mins   PT G Codes:          Weston Anna, MPT Pager: [phone]

## 2014-05-12 NOTE — Progress Notes (Signed)
CARE MANAGEMENT NOTE 05/12/2014  Patient:  BUREL, KAHRE   Account Number:  [id number]  Date Initiated:  05/12/2014  Documentation initiated by:  Karl Bales  Subjective/Objective Assessment:   pt admitted with dehydration,  DVT, Afib     Action/Plan:   from home   Anticipated DC Date:  05/15/2014   Anticipated DC Plan:  [place] referral  Clinical Social Worker      DC Planning Services  CM consult      Choice offered to / List presented to:             Status of service:  In process, will continue to follow Medicare Important Message given?  YES (If response is "NO", the following Medicare IM given date fields will be blank) Date Medicare IM given:  05/09/2014 Medicare IM given by:  Karl Bales Date Additional Medicare IM given:   Additional Medicare IM given by:    Discharge Disposition:    Per UR Regulation:  Reviewed for med. necessity/level of care/duration of stay  If discussed at [place] of Stay Meetings, dates discussed:   05/12/2014    Comments:  05/12/14 MMcGibboney, RN, BSN Pt continues to show Afib increased HR, on IV Amiodarone, IV Heparin. will continue to follow.

## 2014-05-12 NOTE — H&P (View-Only) (Signed)
    Subjective:  Denies CP or dyspnea   Objective:  Filed Vitals:   05/11/14 2144 05/12/14 0500 05/12/14 0642 05/12/14 0742  BP:   92/52   Pulse:   65   Temp:   98 F (36.7 C)   TempSrc:   Oral   Resp:   20   Height:      Weight:  184 lb 8.4 oz (83.7 kg)    SpO2: 96%  96% 95%    Intake/Output from previous day:  Intake/Output Summary (Last 24 hours) at 05/12/14 0803 Last data filed at 05/12/14 0700  Gross per 24 hour  Intake 1205.5 ml  Output    575 ml  Net  630.5 ml    Physical Exam: Physical exam: Well-developed well-nourished in no acute distress.  Skin is warm and dry.  HEENT is normal.  Neck is supple.  Chest CTA Cardiovascular exam is RRR Abdominal exam nontender or distended. No masses palpated. Extremities show 2+ edema RUE. neuro grossly intact    Lab Results: Basic Metabolic Panel:  Recent Labs  05/11/14 0600 05/12/14 0545  NA 137 135*  K 3.8 3.3*  CL 103 100  CO2 24 25  GLUCOSE 116* 134*  BUN 9 8  CREATININE 1.22 1.27  CALCIUM 7.9* 7.9*   CBC:  Recent Labs  05/11/14 0600 05/12/14 0545  WBC 1.2* 3.8*  NEUTROABS 0.8* 3.1  HGB 9.9* 9.5*  HCT 30.5* 29.5*  MCV 94.1 95.5  PLT 126* 116*     Assessment/Plan:  1 PAF-patient in sinus this AM (occasional junctional rhythm). Plan to continue low-dose metoprolol and IV amiodarone. Will transition to oral amiodarone later when he is able to tolerate (for EGD today). He has multiple embolic risk factors including age greater than 91, coronary artery disease, hypertension, diabetes mellitus. Would change to apixaban once all procedures complete. Note LV function is normal. Recent TSH normal. 2 coronary artery disease-Once apixaban initiated, would discontinue aspirin. Resume statin at discharge. 3 hypertension-continue present meds 4 lung cancer-management perOncology.  5 dehydration-improved 6 RUE DVT-on IV heparin; change to apixaban once all procedures complete.  Kirk Ruths 05/12/2014, 8:03 AM

## 2014-05-12 NOTE — Progress Notes (Signed)
[place] for heparin Indication: DVT treatment of RUE/afib  Allergies  Allergen Reactions  . Exenatide     REACTION: nausea    Patient Measurements: Height: 5\' 8"  (172.7 cm) Weight: 184 lb 8.4 oz (83.7 kg) IBW/kg (Calculated) : 68.4  Vital Signs: Temp: 98.7 F (37.1 C) (10/29 2019) Temp Source: Oral (10/29 1451) BP: 113/51 mmHg (10/29 2019) Pulse Rate: 70 (10/29 2019)  Labs:  Recent Labs  05/10/14 0449 05/11/14 0600 05/11/14 2030 05/12/14 0545 05/12/14 0655 05/12/14 2103  HGB 9.6* 9.9*  --  9.5*  --   --   HCT 29.8* 30.5*  --  29.5*  --   --   PLT 111* 126*  --  116*  --   --   LABPROT  --   --  14.9  --   --   --   INR  --   --  1.16  --   --   --   HEPARINUNFRC  --   --  1.05*  --  0.97* 0.32  CREATININE 1.19 1.22  --  1.27  --   --     Estimated Creatinine Clearance: 49.7 ml/min (by C-G formula based on Cr of 1.27).   Assessment: 57 YOM admitted with weakness and afib (was not on therapeutic anticoagulation pending EGD) and switching from enoxaparin (prophylaxis) to heparin gtt for DVT treatment.   Plans for EGD pending resolution of neutropenia. Currently receiving chemo and radiation for lung cancer.  On 10/28 duplex of RUE reveals DVT (has implanted port in R chest wall), pharmacy asked to dose heparin gtt.   Today, 10/29  Heparin level = 0.97 (supratheapeutic) on heparin gtt 1100 units/hr. Reduce rate to 850 units/hr  Scr: 1.27/stable  CBC Hgb = 9.5,pltc = 116 (low stable)  Baseline INR 1.16  No bleeding documented/noted  10/29 2100 Heparin level = 0.32 at 850 units/hr (infusion stopped for EGD and restarted at 1300)  Goal of Therapy:  Anti-Xa (heparin level) level 0.3-0.7 units/ml  Monitor platelets by anticoagulation protocol: Yes  Plan:   Continue heparin rate to 850 units/hr (~10 units/kg/hr)  Daily HL and CBC (watch CBC with chemo 10/22 - carbo/paclitaxel)  Kizzie Furnish, PharmD Pager:  [phone] 05/12/2014 9:46 PM

## 2014-05-12 NOTE — Progress Notes (Addendum)
[place] for heparin Indication: DVT treatment of RUE/afib  Allergies  Allergen Reactions  . Exenatide     REACTION: nausea    Patient Measurements: Height: 5\' 8"  (172.7 cm) Weight: 184 lb 8.4 oz (83.7 kg) IBW/kg (Calculated) : 68.4 Heparin Dosing Weight:   Vital Signs: Temp: 98 F (36.7 C) (10/29 0642) Temp Source: Oral (10/29 7353) BP: 92/52 mmHg (10/29 0642) Pulse Rate: 65 (10/29 0642)  Labs:  Recent Labs  05/10/14 0449 05/11/14 0600 05/11/14 2030 05/12/14 0545 05/12/14 0655  HGB 9.6* 9.9*  --  9.5*  --   HCT 29.8* 30.5*  --  29.5*  --   PLT 111* 126*  --  116*  --   LABPROT  --   --  14.9  --   --   INR  --   --  1.16  --   --   HEPARINUNFRC  --   --  1.05*  --  0.97*  CREATININE 1.19 1.22  --  1.27  --     Estimated Creatinine Clearance: 49.7 ml/min (by C-G formula based on Cr of 1.27).   Assessment: 34 YOM admitted with weakness and afib (was not on therapeutic anticoagulation pending EGD) and switching from enoxaparin (prophylaxis) to heparin gtt for DVT treatment.   Plans for EGD pending resolution of neutropenia. Currently receiving chemo and radiation for lung cancer.  On 10/28 duplex of RUE reveals DVT (has implanted port in R chest wall), pharmacy asked to dose heparin gtt.   Today, 10/29  Heparin level = 0.97 (supratheapeutic) on heparin gtt 1100 units/hr. Previous level also above goal and rate reduced.    Scr: 1.27/stable  CBC Hgb = 9.5,pltc = 116 (low stable)  Baseline INR 1.16  No bleeding documented/noted  Goal of Therapy:  Anti-Xa (heparin level) level 0.3-0.7 units/ml  Monitor platelets by anticoagulation protocol: Yes  Plan:   Reduce heparin rate to 850 units/hr (~10 units/kg/hr) for 2nd supratherapeutic level (suprised by low needs)  Check heparin level 8h after rate change  Daily HL and CBC (watch CBC with chemo 10/22 - carbo/paclitaxel)  Await anticoagulation plans and heparin  interruption for endoscopy (likely today with improved WBC/ANC), likely apixaban as per cardiology's note (this was plan prior to discovery of DVT)  Doreene Eland, PharmD, BCPS.   Pager: [phone] 05/12/2014 7:38 AM   Addendum:  Patient went for EGD this am, found radiation esophagitis.  GI recommends continuing supportive care.  Heparin to be resumed at ~13:00 per floor RN.  Heparin level was supratherapeutic this am and rate adjusted.  Plan:    retime heparin level to 8h after restart heparin (21:00)  Doreene Eland, PharmD, BCPS.   Pager: [phone] 05/12/2014 1:54 PM

## 2014-05-12 NOTE — Progress Notes (Signed)
OT Cancellation Note  Patient Details Name: Gregory Farrell MRN: [id number] DOB: 1932-11-13   Cancelled Treatment:    Reason Eval/Treat Not Completed: Patient at procedure or test/ unavailable.  Will attempt another time.  Latesia Norrington 05/12/2014, 10:29 AM Lesle Chris, OTR/L [phone] 05/12/2014

## 2014-05-12 NOTE — Op Note (Signed)
[place], [id number]   ENDOSCOPY PROCEDURE REPORT  PATIENT: Gregory Farrell, Gregory Farrell  MR#: [id number] BIRTHDATE: [birth year] , 89  yrs. old GENDER: male ENDOSCOPIST: Arta Silence, MD REFERRED BY:  Burney Gauze, M.D. PROCEDURE DATE:  01-Jun-2014 PROCEDURE:  EGD, diagnostic ASA CLASS:     Class III INDICATIONS:  odynophagia. lung cancer with recent radiation treatment MEDICATIONS: Fentanyl 25 mcg IV and Versed 2 mg IV TOPICAL ANESTHETIC: Cetacaine Spray  DESCRIPTION OF PROCEDURE: After the risks benefits and alternatives of the procedure were thoroughly explained, informed consent was obtained.  The Pentax Gastroscope [id number] endoscope was introduced through the mouth and advanced to the second portion of the duodenum. The instrument was slowly withdrawn as the mucosa was fully examined.    Findings:  Fairly focal segment of confluent esophageal ulceration from 25 to 30cm from the incisors.  Appearance highly compatible with radiation esophagitis.  There was extensive mucosal sloughing distal to this ulcerated segment, likely sloughed mucosa from the focal ulcerated region, and as best I could tell the underlying mucosa elsewhere in the esophagus was essentially normal.  No evidence of esophageal candidiasis.  No punctate ulcerations or lesions to suggest CMV or HSV esophagitis.   Medium-sized hiatal hernia.  There was some focal esophageal narrowing in the region of the ulcerative esophagitis, but no focal stricture in the esophagus was seen.  Remainder of the examination to the second portion of the duodenum was normal.          The scope was then withdrawn from the patient and the procedure completed.  COMPLICATIONS: There were no immediate complications.  ENDOSCOPIC IMPRESSION:     Radiation esophagitis.  RECOMMENDATIONS:     1.  Watch for potential complications of procedure. 2.  Supportive care with sucralfate suspension and BID PPI,  as you are doing. 3.  If patient doesn't slowly improve with these supportive measures, and caloric intake is suboptimal, might have to consider short-term parenteral nutrition (TPN). 4.  Unfortunately not much else for [place] to offer; will follow.  eSigned:  Arta Silence, MD 06-01-2014 11:16 AM  CC:  CPT CODES: ICD CODES:  The ICD and CPT codes recommended by this software are interpretations from the data that the clinical staff has captured with the software.  The verification of the translation of this report to the ICD and CPT codes and modifiers is the sole responsibility of the health care institution and practicing physician where this report was generated.  [employer] will not be held responsible for the validity of the ICD and CPT codes included on this report.  AMA assumeno liability for data contained or not contained herein. CPT is a Designer, television/film set of the [employer].

## 2014-05-13 ENCOUNTER — Encounter (HOSPITAL_COMMUNITY): Payer: Self-pay | Admitting: Gastroenterology

## 2014-05-13 ENCOUNTER — Ambulatory Visit: Payer: Medicare HMO

## 2014-05-13 ENCOUNTER — Telehealth: Payer: Self-pay | Admitting: Oncology

## 2014-05-13 DIAGNOSIS — K520 Gastroenteritis and colitis due to radiation: Secondary | ICD-10-CM

## 2014-05-13 DIAGNOSIS — E43 Unspecified severe protein-calorie malnutrition: Secondary | ICD-10-CM

## 2014-05-13 LAB — BASIC METABOLIC PANEL
ANION GAP: 10 (ref 5–15)
BUN: 6 mg/dL (ref 6–23)
CALCIUM: 7.9 mg/dL — AB (ref 8.4–10.5)
CHLORIDE: 104 meq/L (ref 96–112)
CO2: 24 meq/L (ref 19–32)
Creatinine, Ser: 1.33 mg/dL (ref 0.50–1.35)
GFR calc Af Amer: 57 mL/min — ABNORMAL LOW (ref >90)
GFR calc non Af Amer: 49 mL/min — ABNORMAL LOW (ref >90)
Glucose, Bld: 121 mg/dL — ABNORMAL HIGH (ref 70–99)
Potassium: 4.1 mEq/L (ref 3.7–5.3)
SODIUM: 138 meq/L (ref 137–147)

## 2014-05-13 LAB — CBC WITH DIFFERENTIAL/PLATELET
Basophils Absolute: 0 10*3/uL (ref 0.0–0.1)
Basophils Relative: 0 % (ref 0–1)
EOS ABS: 0 10*3/uL (ref 0.0–0.7)
Eosinophils Relative: 0 % (ref 0–5)
HEMATOCRIT: 27.8 % — AB (ref 39.0–52.0)
HEMOGLOBIN: 9 g/dL — AB (ref 13.0–17.0)
LYMPHS PCT: 8 % — AB (ref 12–46)
Lymphs Abs: 0.5 10*3/uL — ABNORMAL LOW (ref 0.7–4.0)
MCH: 30.6 pg (ref 26.0–34.0)
MCHC: 32.4 g/dL (ref 30.0–36.0)
MCV: 94.6 fL (ref 78.0–100.0)
Monocytes Absolute: 0.7 10*3/uL (ref 0.1–1.0)
Monocytes Relative: 12 % (ref 3–12)
NRBC: 2 /100{WBCs} — AB
Neutro Abs: 4.7 10*3/uL (ref 1.7–7.7)
Neutrophils Relative %: 80 % — ABNORMAL HIGH (ref 43–77)
Platelets: 132 10*3/uL — ABNORMAL LOW (ref 150–400)
RBC: 2.94 MIL/uL — ABNORMAL LOW (ref 4.22–5.81)
RDW: 19.7 % — AB (ref 11.5–15.5)
WBC: 5.9 10*3/uL (ref 4.0–10.5)

## 2014-05-13 LAB — GLUCOSE, CAPILLARY
GLUCOSE-CAPILLARY: 92 mg/dL (ref 70–99)
GLUCOSE-CAPILLARY: 117 mg/dL — AB (ref 70–99)
Glucose-Capillary: 131 mg/dL — ABNORMAL HIGH (ref 70–99)
Glucose-Capillary: 179 mg/dL — ABNORMAL HIGH (ref 70–99)
Glucose-Capillary: 102 mg/dL — ABNORMAL HIGH (ref 70–99)
Glucose-Capillary: 165 mg/dL — ABNORMAL HIGH (ref 70–99)

## 2014-05-13 LAB — HEPARIN LEVEL (UNFRACTIONATED): Heparin Unfractionated: 0.44 IU/mL (ref 0.30–0.70)

## 2014-05-13 MED ORDER — ENSURE PUDDING PO PUDG
1.0000 | Freq: Three times a day (TID) | ORAL | Status: DC | PRN
  Filled 2014-05-13: qty 1

## 2014-05-13 MED ORDER — DRONABINOL 2.5 MG PO CAPS
2.5000 mg | ORAL_CAPSULE | Freq: Two times a day (BID) | ORAL | Status: DC
  Administered 2014-05-13 – 2014-05-17 (×9): 2.5 mg via ORAL
  Filled 2014-05-13 (×9): qty 1

## 2014-05-13 MED ORDER — TRAZODONE HCL 50 MG PO TABS
50.0000 mg | ORAL_TABLET | Freq: Every evening | ORAL | Status: DC | PRN
  Administered 2014-05-13: 50 mg via ORAL
  Filled 2014-05-13: qty 1

## 2014-05-13 MED ORDER — APIXABAN 5 MG PO TABS
10.0000 mg | ORAL_TABLET | Freq: Two times a day (BID) | ORAL | Status: DC
  Administered 2014-05-13 – 2014-05-16 (×8): 10 mg via ORAL
  Filled 2014-05-13 (×10): qty 2

## 2014-05-13 MED ORDER — APIXABAN 5 MG PO TABS: 5.0000 mg | ORAL_TABLET | Freq: Two times a day (BID) | ORAL | Status: DC

## 2014-05-13 MED ORDER — AMIODARONE HCL 200 MG PO TABS
400.0000 mg | ORAL_TABLET | Freq: Two times a day (BID) | ORAL | Status: DC
  Administered 2014-05-13 – 2014-05-17 (×9): 400 mg via ORAL
  Filled 2014-05-13 (×10): qty 2

## 2014-05-13 MED ORDER — AMIODARONE IV BOLUS ONLY 150 MG/100ML
150.0000 mg | Freq: Once | INTRAVENOUS | Status: AC
  Administered 2014-05-13: 150 mg via INTRAVENOUS
  Filled 2014-05-13: qty 100

## 2014-05-13 MED ORDER — METOPROLOL TARTRATE 12.5 MG HALF TABLET
12.5000 mg | ORAL_TABLET | Freq: Two times a day (BID) | ORAL | Status: DC
  Administered 2014-05-13 – 2014-05-17 (×9): 12.5 mg via ORAL
  Filled 2014-05-13 (×11): qty 1

## 2014-05-13 NOTE — Progress Notes (Addendum)
Calorie Count Note - Day 1   Intervention:  -Per pt's daughter request, RD has not been discussing nutrition or appetite with patient.  -Would likely benefit from education regarding nutrition therapy for esophagitis; however daughter declining RD assistance at this time -Modified supplement order per discussion with RN  -Continue appetite stimulant -Continue Calorie Count   48 hour calorie count ordered.  Diet: Soft Supplements: Ensure Pudding TID, Resource Breeze TID  Estimated nutrition needs: Kcal: 2200-2400  Protein: 100-115 gram  Fluid: >/= 2200 ml daily   Breakfast: 230 kcal, 5 gram protein Lunch: 68 kcal, 4.5 gram protein Dinner: 7 kcal, 0.5 gram protein Supplements: 0%  Total intake: 305 kcal (14% of minimum estimated needs)  10 gram protein (10% of minimum estimated needs)  Nutrition Dx: Inadequate oral intake related to lack of appetite as evidenced by PO intake < 75%'; continues   Goal: Pt to meet >/= 90% of their estimated nutrition needs; not met  Assessment: -Pt's daughter adamant that RD does not discuss nutrition with patient.  -RD was stopped by daughter before entering pt's room; informed daughter that MD had requested RD to perform calorie count. Daughter allowed calorie count, but requested no further intervention from RD -Pt dislikes supplement per discussion with RN, will d/c Resource Breeze and modify Ensure Pudding to PRN -Family has been bringing in different soups to encourage PO intake. -Marinol started on 10/30   Atlee Abide [place] Clinical Dietitian [phone]

## 2014-05-13 NOTE — Progress Notes (Signed)
    Subjective:  Denies CP or dyspnea   Objective:  Filed Vitals:   05/12/14 1451 05/12/14 2019 05/12/14 2032 05/13/14 0500  BP: 92/49 113/51  112/52  Pulse: 76 70  67  Temp: 98.7 F (37.1 C) 98.7 F (37.1 C)  97.8 F (36.6 C)  TempSrc: Oral   Oral  Resp: 16 20  18   Height:      Weight:    189 lb 13.1 oz (86.1 kg)  SpO2: 99% 95% 95% 95%    Intake/Output from previous day:  Intake/Output Summary (Last 24 hours) at 05/13/14 0746 Last data filed at 05/13/14 0300  Gross per 24 hour  Intake 1005.83 ml  Output   1500 ml  Net -494.17 ml    Physical Exam: Physical exam: Well-developed well-nourished in no acute distress.  Skin is warm and dry.  HEENT is normal.  Neck is supple.  Chest CTA Cardiovascular exam is RRR Abdominal exam nontender or distended. No masses palpated. Extremities show 2+ edema RUE. neuro grossly intact    Lab Results: Basic Metabolic Panel:  Recent Labs  05/12/14 0545 05/13/14 0546  NA 135* 138  K 3.3* 4.1  CL 100 104  CO2 25 24  GLUCOSE 134* 121*  BUN 8 6  CREATININE 1.27 1.33  CALCIUM 7.9* 7.9*   CBC:  Recent Labs  05/12/14 0545 05/13/14 0546  WBC 3.8* 5.9  NEUTROABS 3.1 4.7  HGB 9.5* 9.0*  HCT 29.5* 27.8*  MCV 95.5 94.6  PLT 116* 132*     Assessment/Plan:  1 PAF-patient in sinus this AM (occasional junctional rhythm); still with intermittent PAF. Change amiodarone to 400 BID for 2 weeks and then to 200 mg daily; change metoprolol to po. He has multiple embolic risk factors including age greater than 88, coronary artery disease, hypertension, diabetes mellitus. Would change to apixaban once all procedures complete. Note LV function is normal. Recent TSH normal. 2 coronary artery disease-Once apixaban initiated, would discontinue aspirin. Resume statin at discharge. 3 hypertension-continue present meds 4 lung cancer-management perOncology.  5 dehydration-improved 6 RUE DVT-on IV heparin; change to apixaban once all  procedures complete. 7 Radiation induced esophagitis.  Kirk Ruths 05/13/2014, 7:46 AM

## 2014-05-13 NOTE — Progress Notes (Signed)
Physical Therapy Treatment Patient Details Name: Gregory Farrell MRN: [id number] DOB: 18-Apr-1933 Today's Date: 05/13/2014    History of Present Illness Pt is a 80 yo male admitted with h/o squamous cell Stage IIIB lung CA, COPD, GERD, has been on chemo until this admission, CAD, HTN and DM II. Pt admitted with lightheadedness, weakness and abdominal pain.  Pt found to be orthstatic, dehydtrated and with afib.  Pt also with 2 vessel obstructive coronary disease.      PT Comments    Patient was pleasant and cooperative this session.  He is min-min guard A with all mobility.  Patient was able to increase gait tolerance this session; however, HR reached 170+ and dyspnea 2/4 during gait; O2 sats WNL; pt denies adverse sx.  Patient was highly motivated but stated he was tired at the end of tx and requested positioning back in bed.    Follow Up Recommendations  Home health PT;Supervision/Assistance - 24 hour     Equipment Recommendations  None recommended by PT    Recommendations for Other Services OT consult     Precautions / Restrictions Precautions Precautions: Fall Restrictions Weight Bearing Restrictions: No    Mobility  Bed Mobility Overal bed mobility: Needs Assistance Bed Mobility: Supine to Sit;Sit to Supine     Supine to sit: Min assist Sit to supine: Modified independent (Device/Increase time)   General bed mobility comments: Pt required use of siderail; HOB elevated  Transfers Overall transfer level: Needs assistance Equipment used: Rolling walker (2 wheeled) Transfers: Sit to/from Stand Sit to Stand: Min assist         General transfer comment: sit to stand 2x from EOB using RW; VC's for hand placement  Ambulation/Gait Ambulation/Gait assistance: Min guard Ambulation Distance (Feet): 200 Feet Assistive device: Rolling walker (2 wheeled) Gait Pattern/deviations: Step-through pattern Gait velocity: WNL   General Gait Details: Patient was highly motivated for  gait this session and refused a sitting rest break; pt demo dysnea of 2/4 following gait; O2 sats 99%, HR 170+ bpm; pt denies adverse effects; nursing aware; pt had min LOB during turn in hallway but was easily corrected   Stairs            Wheelchair Mobility    Modified Rankin (Stroke Patients Only)       Balance                                    Cognition Arousal/Alertness: Awake/alert Behavior During Therapy: WFL for tasks assessed/performed Overall Cognitive Status: Within Functional Limits for tasks assessed                      Exercises General Exercises - Lower Extremity Long Arc Quad: Right;Left;AROM;15 reps;Seated Hip Flexion/Marching: AROM;Right;Left;Seated;20 reps    General Comments        Pertinent Vitals/Pain Pain Assessment: 0-10 Pain Score: 5  Pain Location: R hip Pain Intervention(s): Monitored during session    Home Living                      Prior Function            PT Goals (current goals can now be found in the care plan section) Acute Rehab PT Goals PT Goal Formulation: With patient Time For Goal Achievement: 05/26/14 Potential to Achieve Goals: Good    Frequency  Min 3X/week  PT Plan Current plan remains appropriate    Co-evaluation             End of Session Equipment Utilized During Treatment: Gait belt Activity Tolerance: Patient tolerated treatment well Patient left: in bed;with call bell/phone within reach;with bed alarm set;with family/visitor present     Time: 1425-1444 PT Time Calculation (min): 19 min  Charges:                       G Codes:      Miller,Derrick, SPTA 05/13/2014, 3:09 PM  Reviewed  Rica Koyanagi  PTA [facility]  Acute  Rehab Pager      [phone]

## 2014-05-13 NOTE — Progress Notes (Signed)
[facility] PROGRESS NOTE  Gregory Farrell EGB:[id number] DOB: 12-04-1932 DOA: 05/07/2014 PCP: [name], MD  Brief Summary  The patient is a 80 y.o. year-old male with history of stage IIIB squamous cell lung cancer for which he has been undergoing chemotherapy and XRT, COPD, GERD, HTN/HLD, CAD s/p stent 10 years ago, T2DM who presented with generalized weakness and lightheadedness.  He completed his chemo and XRT last week but did not come in regularly for IVF despite nausea and poor appetite.  He states approximately 2 weeks ago, he noticed that his heartbeat was beating irregular intermittently.  The morning of admission, he developed slurred speech, clouded thinking, blurred vision, and dizziness. He has had a-fib with RVR and possibly SVT intermittently and has been started on amiodarone per cardiology.  Stroke work up is on hold because he is too kyphotic to fit in [facility] MRI machine and needs to go to the larger MRI machine at [facility], but cannot transfer until his has fewer episodes of tachycardia.  He is not eating and having dysphagia.  He is very weak.    Assessment/Plan  Slurred speech with confusion and blurred vision, now resolved but concerning for possible stroke - failed initial swallow but passed speech therapy's swallow eval on 10/25 - holding  Atorvastatin until able to swallow.  - Continue diabetes management -  Unable to do MRI. Daughter does not want to proceed with CT head, and or MRI.  -On eliquis now.   Encephalopathy; resolved likely related to ativan.  Patient is very sensitive to ativan, [place]. Sedatives.  Please avoid sedatives.   DVT right subclavian vein and axillary vein:  Transition to Eliquis today, Dr Marin Olp aware.   Skin burn post radiation;  Wound care consulted.    a-fib with RVR 10/25 that responded to amiodarone infusion.  Normal systolic function and no major valvular dysfunction on ECHO.   - Cycle troponins:  neg - TSH 1.36 - amio and  metoprolol per cardiology - on eliquis -  Appreciate cardiology assistance   Dysphagia; post radiation esophagitis.  - Change medications to liquid where possible - GI consult placed by Dr. Marin Olp for endoscopy to rule out stenosis and underlying infectious esophagitis.   -Endoscopy show esophagitis.  -Carafate , PPI.  -Advance diet today.   Acute hypoxic respiratory failure, now resolved.  May have been due to COPD exac.  CT angio chest:  Improving left hilar and infrahilar mass. Persistent mild abnormal soft tissue surrounding the left lower bronchus with improved aeration.  No evidence of PE.   - Continue dulera and prn xopenex  Lower extermity edema may be due to diastolic heart failure or DVT in the setting of malignancy  - Lower extremity duplex:  Negative for DVT - ECHO as above  Mild Acute on chronic kidney disease stage 3, likely prerenal given history and resolved with IVF  Acute urinary retention -  Started flomax - Bladder scan PRN.   Constipation, stable, continue lactulose  CAD, chest pain free  - Continue ASA, BB, statin per cardiology  T2DM, CBG now somewhat low today and not eating - A1c 7.6 - hold levemir blood sugar in the 110 range.  -  Continue low dose SSI  Squamous cell lung CA  - Appreciate oncology assistance    Pancytopenia due to recent chemotherapy -Resolved.  - Neupogen daily, started 10-28 -  tx for hgb < 7 or for symptomatic anemia  Severe protein calorie malnutrition -  Started  Mirtazapine,  celexa discontinue.  -  Continue supplements and liberalized diet  - Nutrition consultation   Dehydration with orthostatic hypotension, resolved with IVF.    Generalized weakness, likely secondary  - PT/OT    Diet:  regular Access: port  IVF:  Off Proph: lovenox   Code Status: FULL  Family Communication: patient and daughter.  Disposition Plan:  Continue telemetry   Consultants:  Cardiology  Procedures:  CXR  CT angio  chest  Duplex lower extremities  ECHO  MRI/MRA  Antibiotics:  none   HPI/Subjective: Patient was sleepy, he wake up. He was just working with PT. He feels better. No pain.    Objective: Filed Vitals:   05/12/14 2032 05/13/14 0500 05/13/14 0939 05/13/14 1324  BP:  112/52  101/52  Pulse:  67  70  Temp:  97.8 F (36.6 C)  98.7 F (37.1 C)  TempSrc:  Oral  Oral  Resp:  18  16  Height:      Weight:  86.1 kg (189 lb 13.1 oz)    SpO2: 95% 95% 94% 95%    Intake/Output Summary (Last 24 hours) at 05/13/14 1655 Last data filed at 05/13/14 1300  Gross per 24 hour  Intake 1195.83 ml  Output    550 ml  Net 645.83 ml   Filed Weights   05/11/14 0611 05/12/14 0500 05/13/14 0500  Weight: 84.3 kg (185 lb 13.6 oz) 83.7 kg (184 lb 8.4 oz) 86.1 kg (189 lb 13.1 oz)    Exam:   General:   No acute distress  Cardiovascular:  RRR, nl S1, S2 no mrg, 2+ pulses, warm extremities  Respiratory:   no wheezes or rhonchi, no increased WOB  Abdomen:   NABS, soft, NT/ND  MSK:   Normal tone and bulk, trace bilateral LEE, 1+ swelling RUE (lying in right side)  Neuro: non focal.   Data Reviewed: Basic Metabolic Panel:  Recent Labs Lab 05/07/14 1026 05/08/14 0355 05/09/14 0503 05/10/14 0449 05/11/14 0600 05/12/14 0545 05/13/14 0546  NA 138 136* 135* 137 137 135* 138  K 4.4 4.9 3.9 3.7 3.8 3.3* 4.1  CL 100 101 101 103 103 100 104  CO2 25 26 23 24 24 25 24   GLUCOSE 125* 164* 209* 122* 116* 134* 121*  BUN 16 17 13 9 9 8 6   CREATININE 1.77* 1.62* 1.35 1.19 1.22 1.27 1.33  CALCIUM 8.5 8.0* 7.6* 7.8* 7.9* 7.9* 7.9*  MG  --  2.1  --   --   --   --   --    Liver Function Tests:  Recent Labs Lab 05/07/14 1026  AST 17  ALT 13  ALKPHOS 101  BILITOT 0.5  PROT 6.1  ALBUMIN 2.6*   No results found for this basename: LIPASE, AMYLASE,  in the last 168 hours No results found for this basename: AMMONIA,  in the last 168 hours CBC:  Recent Labs Lab 05/07/14 1026  05/09/14 0503  05/10/14 0449 05/11/14 0600 05/12/14 0545 05/13/14 0546  WBC 4.8  < > 2.5* 1.9* 1.2* 3.8* 5.9  NEUTROABS 4.4  --   --  1.5* 0.8* 3.1 4.7  HGB 11.5*  < > 9.5* 9.6* 9.9* 9.5* 9.0*  HCT 36.2*  < > 29.8* 29.8* 30.5* 29.5* 27.8*  MCV 96.8  < > 95.5 97.1 94.1 95.5 94.6  PLT 140*  < > 102* 111* 126* 116* 132*  < > = values in this interval not displayed. Cardiac Enzymes:  Recent Labs Lab 05/07/14 1609  05/07/14 2135 05/08/14 0355  TROPONINI <0.30 <0.30 <0.30   BNP (last 3 results) No results found for this basename: PROBNP,  in the last 8760 hours CBG:  Recent Labs Lab 05/12/14 2015 05/13/14 0340 05/13/14 0755 05/13/14 1208 05/13/14 1613  GLUCAP 92 131* 117* 179* 102*    No results found for this or any previous visit (from the past 240 hour(s)).   Studies: No results found.  Scheduled Meds: . amiodarone  400 mg Oral BID  . apixaban  10 mg Oral BID  . [START ON 05/20/2014] apixaban  5 mg Oral BID  . dronabinol  2.5 mg Oral BID AC  . fentaNYL  12.5 mcg Transdermal Q72H  . insulin aspart  0-5 Units Subcutaneous QHS  . insulin aspart  0-9 Units Subcutaneous TID WC  . lactulose  20 g Oral BID  . metoCLOPramide  10 mg Oral TID AC  . metoprolol tartrate  12.5 mg Oral BID  . mirtazapine  15 mg Oral QHS  . mometasone-formoterol  2 puff Inhalation BID  . ondansetron  4 mg Oral 3 times per day  . pantoprazole sodium  40 mg Oral BID  . sodium chloride  3 mL Intravenous Q12H  . sucralfate  1 g Oral TID WC & HS  . tamsulosin  0.4 mg Oral QPC supper   Continuous Infusions:    Active Problems:   Diabetes mellitus, type 2   Coronary atherosclerosis   Lung cancer, hilus   Dehydration   Acute respiratory failure with hypoxia   Elevated d-dimer   Tachycardia   Generalized weakness   Orthostatic hypotension   Atrial fibrillation   Protein-calorie malnutrition, severe    Time spent: 30 min    [place], [facility] Pager [phone]. If 7PM-7AM,  please contact night-coverage at [email or web address], password [facility] 05/13/2014, 4:55 PM  LOS: 6 days

## 2014-05-13 NOTE — Progress Notes (Signed)
Gregory Farrell had his upper endoscopy yesterday. I very much appreciate the great help from Dr. Paulita Fujita. Looks like radiation esophagitis is the only problem. This will heal up. There does not appear to be any strictures. There is no infectious etiology. There is no bleeding.  He is on Carafate. He is on Protonix.  He is on a full liquid diet.  He still has no appetite. I will try him on some Marinol. I really cannot use Megace because of his thromboembolic disease. I cannot use steroids because of his blood sugars.  He is on a heparin drip. He still has some swelling in the right arm. I'll keep him on the heparin drip for right now.  He still is on amiodarone. He appears to be in sinus rhythm.  He did do some physical therapy yesterday. I appreciate physical therapy's determination with helping him. I realize he can be a little bit cranky.  His lab work shows his hemoglobin to be 9. Plan account 132. White cell count 5.9. I will stop the Neupogen. His renal function is stable. Blood sugar is okay.  Hopefully, he will begin to eat a little bit more. I think a lot of this might be his lack of appetite. I know this will get better as will the radiation esophagitis.  He is on a lot of medicines. It would be nice to try to minimize these of possible to make it easier for him.  His physical exam is pretty much unremarkable. Blood pressure 112/52. Temperature 97.8. Pulse is 67. Lungs are clear. Cardiac exam regular rate and rhythm. Abdomen is soft. Bowel sounds present. Extremity shows the pain and edema of the right arm. Left arm has minimal edema. There may be some trace edema in his legs.  From my point of view, he still is not ready for discharge. He is just too weak to go home. We need to get him eating better. We need continued physical therapy.  He is still on the amiodarone and heparin infusions.  I appreciate the great care that he is getting from the staff on [address] and from all the doctors  who have been seeing him.  [name]  Psalm 1:1

## 2014-05-13 NOTE — Telephone Encounter (Signed)
Boyd Kerbs, RN on [place] her that Andreu's radiation treatment is on hold today.  Marissa verbalized agreement.

## 2014-05-13 NOTE — Progress Notes (Signed)
Report received from M.Long,RN. No change in assessment. Will continue plan of care. Stacey Drain

## 2014-05-13 NOTE — Progress Notes (Addendum)
Holden for heparin --? apixaban Indication: DVT treatment of RUE/afib  Allergies  Allergen Reactions  . Exenatide     REACTION: nausea    Patient Measurements: Height: 5\' 8"  (172.7 cm) Weight: 189 lb 13.1 oz (86.1 kg) IBW/kg (Calculated) : 68.4 Heparin Dosing Weight:   Vital Signs: Temp: 97.8 F (36.6 C) (10/30 0500) Temp Source: Oral (10/30 0500) BP: 112/52 mmHg (10/30 0500) Pulse Rate: 67 (10/30 0500)  Labs:  Recent Labs  05/11/14 0600  05/11/14 2030 05/12/14 0545 05/12/14 0655 05/12/14 2103 05/13/14 0546  HGB 9.9*  --   --  9.5*  --   --  9.0*  HCT 30.5*  --   --  29.5*  --   --  27.8*  PLT 126*  --   --  116*  --   --  132*  LABPROT  --   --  14.9  --   --   --   --   INR  --   --  1.16  --   --   --   --   HEPARINUNFRC  --   < > 1.05*  --  0.97* 0.32 0.44  CREATININE 1.22  --   --  1.27  --   --  1.33  < > = values in this interval not displayed.  Estimated Creatinine Clearance: 48.1 ml/min (by C-G formula based on Cr of 1.33).   Assessment: 82 YOM admitted with weakness and afib (was not on therapeutic anticoagulation pending EGD) and switching from enoxaparin (prophylaxis) to heparin gtt for DVT treatment.   Plans for EGD pending resolution of neutropenia. Currently receiving chemo and radiation for lung cancer.   Significant events:  10/28: duplex of RUE reveals DVT (has implanted port in R chest wall), pharmacy asked to dose heparin gtt.  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  GI recommends continuing supportive care  Today, 10/30  Heparin level = 0.44 (2nd therapeutic level) on heparin gtt 850 units/hr.  Scr: 1.33/stable  CBC Hgb = 9 (trending down),pltc = 132 (improved)  Baseline INR 1.16  No bleeding documented/noted  Goal of Therapy:  Anti-Xa (heparin level) level 0.3-0.7 units/ml  Monitor platelets by anticoagulation protocol: Yes  Plan:   Continue heparin rate at 850 units/hr (~10  units/kg/hr)   Daily HL and CBC (watch CBC with chemo 10/22 - carbo/paclitaxel)  Await anticoagulation plans, likely apixaban.  Will need VTE treatment dosing which means 10gm BID x 7 days then 5mg  BID thereafter.  Will need Rx for #74 tabs for 30-day supply for first month  Cardiology note states stop ASA with start of PO anticoagulation - appears to have already been stopped (no longer active order), but should not be resumed during discharge med rec.  Doreene Eland, PharmD, BCPS.   Pager: [phone] 05/13/2014 8:05 AM   Addendum  Dr. Tyrell Antonio gave verbal order to change to apixiban during rounds.    Plan:  Due to acute RUE DVT, will start apixaban 10mg  PO BID x 7 days then 5mg  PO BID  Will need Rx for #74 tabs for 30-day supply for first month  D/C heparin gtt when 1st dose apixaban given  D/C heparin labs (daily order for CBC with diff per MD)  Educate when appropriate  Doreene Eland, PharmD, BCPS.   Pager: [phone] 05/13/2014 10:37 AM

## 2014-05-13 NOTE — Progress Notes (Signed)
Occupational Therapy Treatment Patient Details Name: Gregory Farrell MRN: [id number] DOB: 14-Sep-1932 Today's Date: 05/13/2014    History of present illness Pt is a 80 yo male admitted with h/o squamous cell Stage IIIB lung CA, COPD, GERD, has been on chemo until this admission, CAD, HTN and DM II. Pt admitted with lightheadedness, weakness and abdominal pain.  Pt found to be orthstatic, dehydtrated and with afib.  Pt also with 2 vessel obstructive coronary disease.     OT comments  Pt slowly progressing with adls.  Pt agreed to get up and work on LE dressing and grooming today at the sink.  Pt walked to bathroom with walker to work on Economist.  Daughter had many questions re: rehab vs. SNF rehab.  Answered questions to best of my ability.  Daughter now seems to agree that this pt cannot tolerate acute therapy.  Left pt up in chair with goal of sitting up for minimum of 1 hour.  Pt is moving well enough that this should be reasonable goal at least 2 if not 3 times a day.  Making good progress with goals with lots of encouragement.    Follow Up Recommendations  SNF    Equipment Recommendations  None recommended by OT    Recommendations for Other Services      Precautions / Restrictions Precautions Precautions: Fall Restrictions Weight Bearing Restrictions: No       Mobility Bed Mobility Overal bed mobility: Needs Assistance Bed Mobility: Supine to Sit     Supine to sit: Supervision     General bed mobility comments: HOB flat today and pt needed no physical assist.  Transfers Overall transfer level: Needs assistance Equipment used: Rolling walker (2 wheeled) Transfers: Sit to/from Stand Sit to Stand: Min assist         General transfer comment: min assist needed from low chair w/o arm rails.  From bed, pt required VCs only for safety and hand placement.    Balance Overall balance assessment: Needs assistance Sitting-balance support: Feet supported Sitting  balance-Leahy Scale: Good Sitting balance - Comments: sat EOB for 5 min donning socks unassisted for balance.   Standing balance support: During functional activity;Bilateral upper extremity supported Standing balance-Leahy Scale: Poor Standing balance comment: Pt continues to need walker or sink to support self in standing.  Pt's knees buckled on two occasions standing but pt able to recover w/o assist.                   ADL Overall ADL's : Needs assistance/impaired Eating/Feeding: Set up;Sitting Eating/Feeding Details (indicate cue type and reason): needs encouragment to eat. Grooming: Wash/dry hands;Wash/dry face;Oral care;Supervision/safety Grooming Details (indicate cue type and reason): pt walked to sink with walker and min guard.  Groomed at sink with one LOB but otherwise S at sink when holding to sink             Lower Body Dressing: Minimal assistance;Sit to/from stand Lower Body Dressing Details (indicate cue type and reason): Pt able to donn socks by crossing legs today. Toilet Transfer: Min guard;Ambulation;Grab bars;Comfort height toilet Toilet Transfer Details (indicate cue type and reason): cues for safety.  Pt tends to push up from walker. Toileting- Water quality scientist and Hygiene: Min guard;Sit to/from stand Toileting - Clothing Manipulation Details (indicate cue type and reason): min guard for balance.     Functional mobility during ADLs: Min guard;Rolling walker General ADL Comments: Pt fatigues quickly.  Pt resistant to sitting in chair for  long but if you give him a time frame, he does tend to stay up.       Vision                     Perception     Praxis      Cognition   Behavior During Therapy: WFL for tasks assessed/performed Overall Cognitive Status:  (seems to have some memory deficits in re: safety)       Memory: Decreased recall of precautions               Extremity/Trunk Assessment               Exercises      Shoulder Instructions       General Comments      Pertinent Vitals/ Pain       Pain Assessment: No/denies pain  Home Living                                          Prior Functioning/Environment              Frequency Min 2X/week     Progress Toward Goals  OT Goals(current goals can now be found in the care plan section)  Progress towards OT goals: Progressing toward goals  Acute Rehab OT Goals Patient Stated Goal: to rest! OT Goal Formulation: With patient/family Time For Goal Achievement: 05/24/14 Potential to Achieve Goals: Good ADL Goals Pt Will Perform Grooming: with supervision;standing Pt Will Perform Lower Body Bathing: with min guard assist;sit to/from stand Pt Will Perform Lower Body Dressing: with min guard assist;sit to/from stand Pt Will Transfer to Toilet: with supervision;ambulating Pt Will Perform Toileting - Clothing Manipulation and hygiene: with supervision;sit to/from stand Pt Will Perform Tub/Shower Transfer: with supervision;rolling walker;ambulating  Plan Discharge plan needs to be updated    Co-evaluation                 End of Session Equipment Utilized During Treatment: Rolling walker   Activity Tolerance Patient limited by fatigue   Patient Left in chair;with call bell/phone within reach;with family/visitor present   Nurse Communication Mobility status        Time: 2297-9892 OT Time Calculation (min): 38 min  Charges: OT General Charges $OT Visit: 1 Procedure OT Treatments $Self Care/Home Management : 38-52 mins  Glenford Peers 05/13/2014, 9:41 AM [phone]

## 2014-05-14 LAB — BASIC METABOLIC PANEL
Anion gap: 10 (ref 5–15)
BUN: 5 mg/dL — ABNORMAL LOW (ref 6–23)
CO2: 25 meq/L (ref 19–32)
Calcium: 8.2 mg/dL — ABNORMAL LOW (ref 8.4–10.5)
Chloride: 104 mEq/L (ref 96–112)
Creatinine, Ser: 1.43 mg/dL — ABNORMAL HIGH (ref 0.50–1.35)
GFR calc Af Amer: 52 mL/min — ABNORMAL LOW (ref >90)
GFR, EST NON AFRICAN AMERICAN: 45 mL/min — AB (ref >90)
GLUCOSE: 161 mg/dL — AB (ref 70–99)
POTASSIUM: 3.6 meq/L — AB (ref 3.7–5.3)
SODIUM: 139 meq/L (ref 137–147)

## 2014-05-14 LAB — CBC WITH DIFFERENTIAL/PLATELET
Basophils Absolute: 0 10*3/uL (ref 0.0–0.1)
Basophils Relative: 1 % (ref 0–1)
EOS PCT: 0 % (ref 0–5)
Eosinophils Absolute: 0 10*3/uL (ref 0.0–0.7)
HCT: 27.4 % — ABNORMAL LOW (ref 39.0–52.0)
Hemoglobin: 8.8 g/dL — ABNORMAL LOW (ref 13.0–17.0)
Lymphocytes Relative: 9 % — ABNORMAL LOW (ref 12–46)
Lymphs Abs: 0.5 10*3/uL — ABNORMAL LOW (ref 0.7–4.0)
MCH: 31 pg (ref 26.0–34.0)
MCHC: 32.1 g/dL (ref 30.0–36.0)
MCV: 96.5 fL (ref 78.0–100.0)
Monocytes Absolute: 0.7 10*3/uL (ref 0.1–1.0)
Monocytes Relative: 13 % — ABNORMAL HIGH (ref 3–12)
Neutro Abs: 4.1 10*3/uL (ref 1.7–7.7)
Neutrophils Relative %: 77 % (ref 43–77)
Platelets: 123 10*3/uL — ABNORMAL LOW (ref 150–400)
RBC: 2.84 MIL/uL — ABNORMAL LOW (ref 4.22–5.81)
RDW: 20.3 % — ABNORMAL HIGH (ref 11.5–15.5)
WBC: 5.2 10*3/uL (ref 4.0–10.5)

## 2014-05-14 LAB — GLUCOSE, CAPILLARY
GLUCOSE-CAPILLARY: 128 mg/dL — AB (ref 70–99)
Glucose-Capillary: 166 mg/dL — ABNORMAL HIGH (ref 70–99)
Glucose-Capillary: 136 mg/dL — ABNORMAL HIGH (ref 70–99)
Glucose-Capillary: 141 mg/dL — ABNORMAL HIGH (ref 70–99)

## 2014-05-14 LAB — PREPARE RBC (CROSSMATCH)

## 2014-05-14 LAB — ABO/RH: ABO/RH(D): O POS

## 2014-05-14 MED ORDER — ZOLPIDEM TARTRATE 5 MG PO TABS
5.0000 mg | ORAL_TABLET | Freq: Every evening | ORAL | Status: DC | PRN
  Administered 2014-05-14: 5 mg via ORAL
  Filled 2014-05-14: qty 1

## 2014-05-14 MED ORDER — FUROSEMIDE 10 MG/ML IJ SOLN
10.0000 mg | Freq: Once | INTRAMUSCULAR | Status: AC
  Administered 2014-05-14: 10 mg via INTRAVENOUS
  Filled 2014-05-14: qty 2

## 2014-05-14 MED ORDER — POTASSIUM CHLORIDE 10 MEQ/100ML IV SOLN
10.0000 meq | INTRAVENOUS | Status: AC
  Administered 2014-05-14 (×2): 10 meq via INTRAVENOUS
  Filled 2014-05-14 (×2): qty 100

## 2014-05-14 MED ORDER — DIPHENHYDRAMINE HCL 12.5 MG/5ML PO ELIX: 12.5000 mg | ORAL_SOLUTION | Freq: Every evening | ORAL | Status: DC | PRN

## 2014-05-14 MED ORDER — SODIUM CHLORIDE 0.9 % IV SOLN
Freq: Once | INTRAVENOUS | Status: AC
  Administered 2014-05-14: 09:00:00 via INTRAVENOUS

## 2014-05-14 NOTE — Progress Notes (Signed)
       Patient Name: Gregory Farrell Date of Encounter: 05/14/2014    SUBJECTIVE: Feels well this morning. Appetite is improving.  TELEMETRY:  Still having bursts of atrial fibrillation as recently as 5 minutes ago with heart rates up to 1 40 bpm Filed Vitals:   05/13/14 1324 05/13/14 2004 05/13/14 2034 05/14/14 0641  BP: 101/52  109/48 108/46  Pulse: 70 72 89 68  Temp: 98.7 F (37.1 C)  98.2 F (36.8 C) 98.5 F (36.9 C)  TempSrc: Oral  Oral Oral  Resp: 16 16 16 16   Height:      Weight:    190 lb 0.6 oz (86.2 kg)  SpO2: 95% 95% 96% 95%    Intake/Output Summary (Last 24 hours) at 05/14/14 0816 Last data filed at 05/14/14 0640  Gross per 24 hour  Intake    460 ml  Output    700 ml  Net   -240 ml   LABS: Basic Metabolic Panel:  Recent Labs  05/13/14 0546 05/14/14 0435  NA 138 139  K 4.1 3.6*  CL 104 104  CO2 24 25  GLUCOSE 121* 161*  BUN 6 5*  CREATININE 1.33 1.43*  CALCIUM 7.9* 8.2*   CBC:  Recent Labs  05/13/14 0546 05/14/14 0435  WBC 5.9 5.2  NEUTROABS 4.7 4.1  HGB 9.0* 8.8*  HCT 27.8* 27.4*  MCV 94.6 96.5  PLT 132* 123*     Radiology/Studies:  No new data  Physical Exam: Blood pressure 108/46, pulse 68, temperature 98.5 F (36.9 C), temperature source Oral, resp. rate 16, height 5\' 8"  (1.727 m), weight 190 lb 0.6 oz (86.2 kg), SpO2 95.00%. Weight change: 3.5 oz (0.1 kg)  Wt Readings from Last 3 Encounters:  05/14/14 190 lb 0.6 oz (86.2 kg)  05/14/14 190 lb 0.6 oz (86.2 kg)  05/05/14 179 lb (81.194 kg)   Lying flat in bed without dyspnea Chest is clear anteriorly 1/6 systolic murmur left midsternal border No peripheral edema  ASSESSMENT:  1. Paroxysmal atrial fibrillation, improving as we loaded with amiodarone 2. Chronic anticoagulation, with high chads score related to atrial fibrillation. 3. History of CAD 4. Lung cancer 5. Dehydration improving  Plan:  Continue loading with amiodarone. As amiodarone loads, atrial fibrillation  episodes are becoming less frequent. Please see the preceding no by Dr. Stanford Breed from 05/13/14 with reference to recommended anticoagulation.  Valerie Roys W 05/14/2014, 8:16 AM

## 2014-05-14 NOTE — Progress Notes (Signed)
Clinical Social Work Department BRIEF PSYCHOSOCIAL ASSESSMENT 05/14/2014  Patient:  Gregory Farrell, Gregory Farrell     Account Number:  [id number]     Admit date:  05/07/2014  Clinical Social Worker:  Earlie Server  Date/Time:  05/14/2014 12:00 N  Referred by:  Physician  Date Referred:  05/14/2014 Referred for  SNF Placement   Other Referral:   Interview type:  Patient Other interview type:    PSYCHOSOCIAL DATA Living Status:  FAMILY Admitted from facility:   Level of care:   Primary support name:  Katharine Look Primary support relationship to patient:  CHILD, ADULT Degree of support available:   Strong    CURRENT CONCERNS Current Concerns  Post-Acute Placement   Other Concerns:    SOCIAL WORK ASSESSMENT / PLAN CSW received referral in order to assist with DC planning. CSW reviewed chart and spoke with bedside RN. CSW met with patient and dtr at bedside. CSW introduced myself and explained role.    Patient was living at home prior to admission with wife. Dtr reports she is concerned about patient returning home because wife cannot provide lots of assistance and dtr feels patient needs 24 hour care. CSW explained no skilled need at this time and PT is only recommending HH. Dtr reports if she can talk with insurance company to determine if they would consider paying for SNF placement. CSW encouraged dtr to contact insurance company re: benefits and explained that CSW could contact insurance liaison on Monday. CSW explained that Medicare does not cover custodial care for LT care. Patient does not have Medicaid but does think he has LT care insurance. Dtr reports she will look for policy as well.    CSW will continue to follow but explained that patient is not meeting criteria for SNF placement at this time.   Assessment/plan status:  Psychosocial Support/Ongoing Assessment of Needs Other assessment/ plan:   Information/referral to community resources:   SNF information    PATIENT'S/FAMILY'S  RESPONSE TO PLAN OF CARE: Patient alert but allowed dtr to ask majority of questions during assessment. Patient's dtr reports she feels that SNF placement is needed and that she wants further information. Dtr feels that patient will not be successful at home but is worried about paying privately for custodial care. Dtr agreeable to continued CSW follow up.       Sindy Messing, LCSW (Weekend Coverage)

## 2014-05-14 NOTE — Progress Notes (Signed)
[facility] PROGRESS NOTE  Gregory Farrell:[id number] DOB: Apr 25, 1933 DOA: 05/07/2014 PCP: [name], MD  Brief Summary  The patient is a 80 y.o. year-old male with history of stage IIIB squamous cell lung cancer for which he has been undergoing chemotherapy and XRT, COPD, GERD, HTN/HLD, CAD s/p stent 10 years ago, T2DM who presented with generalized weakness and lightheadedness.  He completed his chemo and XRT last week but did not come in regularly for IVF despite nausea and poor appetite.  He states approximately 2 weeks ago, he noticed that his heartbeat was beating irregular intermittently.  The morning of admission, he developed slurred speech, clouded thinking, blurred vision, and dizziness. He has had a-fib with RVR and possibly SVT intermittently and has been started on amiodarone per cardiology.  Stroke work up is on hold because he is too kyphotic to fit in [facility] MRI machine and needs to go to the larger MRI machine at [facility], but cannot transfer until his has fewer episodes of tachycardia.  He is not eating and having dysphagia.  He is very weak.    Assessment/Plan Slurred speech with confusion and blurred vision, now resolved but concerning for possible stroke - failed initial swallow but passed speech therapy's swallow eval on 10/25 - holding  Atorvastatin until able to swallow.  - Continue diabetes management - Unable to do MRI. Daughter does not want to proceed with CT head, and or MRI.  -On eliquis now.   Encephalopathy; resolved likely related to ativan.  Patient is very sensitive to ativan, [place]. Sedatives.  Please avoid sedatives.  Feels sleepy today. Received trazodone last night. I have discontinue trazodone. Could try tylenol at bed timne.   DVT right subclavian vein and axillary vein:  Transition to Eliquis today, Dr Marin Olp aware.   Skin burn post radiation;  Wound care consulted. Local care.    a-fib with RVR 10/25 that responded to amiodarone infusion.   Normal systolic function and no major valvular dysfunction on ECHO.   - Cycle troponins:  neg - TSH 1.36 - amio and metoprolol per cardiology - on eliquis -  Appreciate cardiology assistance   Dysphagia; post radiation esophagitis.  - Change medications to liquid where possible - GI consult placed by Dr. Marin Olp for endoscopy to rule out stenosis and underlying infectious esophagitis.   -Endoscopy show esophagitis.  -Carafate , PPI.  -Advance diet today.   Acute hypoxic respiratory failure, now resolved.  May have been due to COPD exac.  CT angio chest:  Improving left hilar and infrahilar mass. Persistent mild abnormal soft tissue surrounding the left lower bronchus with improved aeration.  No evidence of PE.   - Continue dulera and prn xopenex  Lower extermity edema may be due to diastolic heart failure or DVT in the setting of malignancy  - Lower extremity duplex:  Negative for DVT - ECHO as above  Mild Acute on chronic kidney disease stage 3, likely prerenal given history and resolved with IVF Increase cr today. Avoid hypotension, repeat labs in am.   Acute urinary retention -  Started flomax - Bladder scan PRN.   Constipation, stable, continue lactulose  CAD, chest pain free  - Continue ASA, BB, statin per cardiology  T2DM, CBG now somewhat low today and not eating - A1c 7.6 - hold levemir blood sugar in the 110 range.  -  Continue low dose SSI  Squamous cell lung CA  - Appreciate oncology assistance    Pancytopenia due to recent chemotherapy -  Resolved.  - Neupogen daily, started 10-28 -  tx for hgb < 7 or for symptomatic anemia  Severe protein calorie malnutrition -  Started  Mirtazapine, celexa discontinue.  -  Continue supplements and liberalized diet  - Nutrition consultation   Dehydration with orthostatic hypotension, resolved with IVF.    Generalized weakness, likely secondary  - PT/OT    Diet:  regular Access: port  IVF:  Off Proph: lovenox    Code Status: FULL  Family Communication: patient and daughter.  Disposition Plan:  Continue telemetry   Consultants:  Cardiology  Procedures:  CXR  CT angio chest  Duplex lower extremities  ECHO  MRI/MRA  Antibiotics:  none   HPI/Subjective: He feels sleepy today. Eating breakfast. No chest pain, no pain with food.    Objective: Filed Vitals:   05/13/14 2004 05/13/14 2034 05/14/14 0641 05/14/14 0932  BP:  109/48 108/46   Pulse: 72 89 68   Temp:  98.2 F (36.8 C) 98.5 F (36.9 C)   TempSrc:  Oral Oral   Resp: 16 16 16    Height:      Weight:   86.2 kg (190 lb 0.6 oz)   SpO2: 95% 96% 95% 96%    Intake/Output Summary (Last 24 hours) at 05/14/14 1243 Last data filed at 05/14/14 1116  Gross per 24 hour  Intake    460 ml  Output    700 ml  Net   -240 ml   Filed Weights   05/12/14 0500 05/13/14 0500 05/14/14 0641  Weight: 83.7 kg (184 lb 8.4 oz) 86.1 kg (189 lb 13.1 oz) 86.2 kg (190 lb 0.6 oz)    Exam:   General:   No acute distress  Cardiovascular:  RRR, nl S1, S2 no mrg, 2+ pulses, warm extremities  Respiratory:   no wheezes or rhonchi, no increased WOB  Abdomen:   NABS, soft, NT/ND  MSK:   Normal tone and bulk, trace bilateral LEE, 1+ swelling RUE (lying in right side)  Neuro: non focal.   Data Reviewed: Basic Metabolic Panel:  Recent Labs Lab 05/08/14 0355  05/10/14 0449 05/11/14 0600 05/12/14 0545 05/13/14 0546 05/14/14 0435  NA 136*  < > 137 137 135* 138 139  K 4.9  < > 3.7 3.8 3.3* 4.1 3.6*  CL 101  < > 103 103 100 104 104  CO2 26  < > 24 24 25 24 25   GLUCOSE 164*  < > 122* 116* 134* 121* 161*  BUN 17  < > 9 9 8 6  5*  CREATININE 1.62*  < > 1.19 1.22 1.27 1.33 1.43*  CALCIUM 8.0*  < > 7.8* 7.9* 7.9* 7.9* 8.2*  MG 2.1  --   --   --   --   --   --   < > = values in this interval not displayed. Liver Function Tests: No results found for this basename: AST, ALT, ALKPHOS, BILITOT, PROT, ALBUMIN,  in the last 168 hours No  results found for this basename: LIPASE, AMYLASE,  in the last 168 hours No results found for this basename: AMMONIA,  in the last 168 hours CBC:  Recent Labs Lab 05/10/14 0449 05/11/14 0600 05/12/14 0545 05/13/14 0546 05/14/14 0435  WBC 1.9* 1.2* 3.8* 5.9 5.2  NEUTROABS 1.5* 0.8* 3.1 4.7 4.1  HGB 9.6* 9.9* 9.5* 9.0* 8.8*  HCT 29.8* 30.5* 29.5* 27.8* 27.4*  MCV 97.1 94.1 95.5 94.6 96.5  PLT 111* 126* 116* 132* 123*   Cardiac  Enzymes:  Recent Labs Lab 05/07/14 1609 05/07/14 2135 05/08/14 0355  TROPONINI <0.30 <0.30 <0.30   BNP (last 3 results) No results found for this basename: PROBNP,  in the last 8760 hours CBG:  Recent Labs Lab 05/13/14 0755 05/13/14 1208 05/13/14 1613 05/13/14 1957 05/14/14 0815  GLUCAP 117* 179* 102* 165* 128*    No results found for this or any previous visit (from the past 240 hour(s)).   Studies: No results found.  Scheduled Meds: . amiodarone  400 mg Oral BID  . apixaban  10 mg Oral BID  . [START ON 05/20/2014] apixaban  5 mg Oral BID  . dronabinol  2.5 mg Oral BID AC  . fentaNYL  12.5 mcg Transdermal Q72H  . furosemide  10 mg Intravenous Once  . insulin aspart  0-5 Units Subcutaneous QHS  . insulin aspart  0-9 Units Subcutaneous TID WC  . lactulose  20 g Oral BID  . metoCLOPramide  10 mg Oral TID AC  . metoprolol tartrate  12.5 mg Oral BID  . mirtazapine  15 mg Oral QHS  . mometasone-formoterol  2 puff Inhalation BID  . ondansetron  4 mg Oral 3 times per day  . pantoprazole sodium  40 mg Oral BID  . potassium chloride  10 mEq Intravenous Q1 Hr x 2  . sodium chloride  3 mL Intravenous Q12H  . sucralfate  1 g Oral TID WC & HS  . tamsulosin  0.4 mg Oral QPC supper   Continuous Infusions:    Active Problems:   Diabetes mellitus, type 2   Coronary atherosclerosis   Lung cancer, hilus   Dehydration   Acute respiratory failure with hypoxia   Elevated d-dimer   Tachycardia   Generalized weakness   Orthostatic  hypotension   Atrial fibrillation   Protein-calorie malnutrition, severe    Time spent: 30 min    [place], [facility] Pager [phone]. If 7PM-7AM, please contact night-coverage at [email or web address], password [facility] 05/14/2014, 12:43 PM  LOS: 7 days

## 2014-05-14 NOTE — Progress Notes (Signed)
Mr. Anello is about the same area he continues on amiodarone. I have seen the physical therapy note.  He is now on ELIQUIS. His right arm still is a little swollen. There is no bleeding.  He still is not eating much. His calorie count has shown this. I have him on some Marinol.  I don't know if depression is a problem. It certainly might be. He may need an anti-depressant.  He is not complaining of any obvious pain.  He is not yet had a bowel movement.  His hemoglobin is 8.8. I really think that he may benefit from a transfusion. I realize that his blood is not that low but given his age, comorbid conditions, I think that a transfusion might get him to feel a little better. I talked to him about this. He understands why I am recommending this. He agrees.  His renal function is doing okay. On his physical exam, his blood pressure is 108/46. His pulse is 68. Temperature is 98.5. Lungs are with some scattered wheezes bilaterally. He has decent air movement. Cardiac exam regular rate and rhythm. Has a 1/6 systolic murmur. Abdomen is soft. Bowel sounds are slightly decreased. Extremity shows the swelling of the right arm.  His progress is very slow. Again maybe a blood transfusion can help. Maybe an antidepressant will help.  He still is not ready for discharge. There is no way he can be at home right now.  Gregory E.  1 Thessalonians 5:16-18

## 2014-05-15 DIAGNOSIS — C801 Malignant (primary) neoplasm, unspecified: Secondary | ICD-10-CM

## 2014-05-15 DIAGNOSIS — I480 Paroxysmal atrial fibrillation: Secondary | ICD-10-CM | POA: Insufficient documentation

## 2014-05-15 DIAGNOSIS — I4891 Unspecified atrial fibrillation: Secondary | ICD-10-CM

## 2014-05-15 DIAGNOSIS — I499 Cardiac arrhythmia, unspecified: Secondary | ICD-10-CM

## 2014-05-15 LAB — CBC WITH DIFFERENTIAL/PLATELET
BASOS ABS: 0 10*3/uL (ref 0.0–0.1)
Basophils Relative: 0 % (ref 0–1)
Eosinophils Absolute: 0 10*3/uL (ref 0.0–0.7)
Eosinophils Relative: 0 % (ref 0–5)
HEMATOCRIT: 33.3 % — AB (ref 39.0–52.0)
Hemoglobin: 10.8 g/dL — ABNORMAL LOW (ref 13.0–17.0)
Lymphocytes Relative: 12 % (ref 12–46)
Lymphs Abs: 0.6 10*3/uL — ABNORMAL LOW (ref 0.7–4.0)
MCH: 30.9 pg (ref 26.0–34.0)
MCHC: 32.4 g/dL (ref 30.0–36.0)
MCV: 95.1 fL (ref 78.0–100.0)
Monocytes Absolute: 0.9 10*3/uL (ref 0.1–1.0)
Monocytes Relative: 18 % — ABNORMAL HIGH (ref 3–12)
NEUTROS ABS: 3.7 10*3/uL (ref 1.7–7.7)
Neutrophils Relative %: 70 % (ref 43–77)
PLATELETS: 123 10*3/uL — AB (ref 150–400)
RBC: 3.5 MIL/uL — ABNORMAL LOW (ref 4.22–5.81)
RDW: 20.4 % — ABNORMAL HIGH (ref 11.5–15.5)
WBC: 5.3 10*3/uL (ref 4.0–10.5)

## 2014-05-15 LAB — URINE MICROSCOPIC-ADD ON

## 2014-05-15 LAB — BASIC METABOLIC PANEL
ANION GAP: 9 (ref 5–15)
BUN: 6 mg/dL (ref 6–23)
CALCIUM: 8.1 mg/dL — AB (ref 8.4–10.5)
CO2: 28 meq/L (ref 19–32)
Chloride: 106 mEq/L (ref 96–112)
Creatinine, Ser: 1.53 mg/dL — ABNORMAL HIGH (ref 0.50–1.35)
GFR calc Af Amer: 48 mL/min — ABNORMAL LOW (ref >90)
GFR, EST NON AFRICAN AMERICAN: 42 mL/min — AB (ref >90)
GLUCOSE: 142 mg/dL — AB (ref 70–99)
POTASSIUM: 4 meq/L (ref 3.7–5.3)
SODIUM: 143 meq/L (ref 137–147)

## 2014-05-15 LAB — TYPE AND SCREEN
ABO/RH(D): O POS
Antibody Screen: NEGATIVE
UNIT DIVISION: 0
Unit division: 0

## 2014-05-15 LAB — URINALYSIS, ROUTINE W REFLEX MICROSCOPIC
BILIRUBIN URINE: NEGATIVE
Glucose, UA: NEGATIVE mg/dL
Ketones, ur: NEGATIVE mg/dL
NITRITE: NEGATIVE
PROTEIN: NEGATIVE mg/dL
Specific Gravity, Urine: 1.016 (ref 1.005–1.030)
Urobilinogen, UA: 1 mg/dL (ref 0.0–1.0)
pH: 6.5 (ref 5.0–8.0)

## 2014-05-15 LAB — GLUCOSE, CAPILLARY
GLUCOSE-CAPILLARY: 141 mg/dL — AB (ref 70–99)
GLUCOSE-CAPILLARY: 181 mg/dL — AB (ref 70–99)
Glucose-Capillary: 146 mg/dL — ABNORMAL HIGH (ref 70–99)
Glucose-Capillary: 139 mg/dL — ABNORMAL HIGH (ref 70–99)
Glucose-Capillary: 109 mg/dL — ABNORMAL HIGH (ref 70–99)

## 2014-05-15 MED ORDER — DIPHENHYDRAMINE HCL 12.5 MG/5ML PO ELIX
12.5000 mg | ORAL_SOLUTION | Freq: Every evening | ORAL | Status: DC | PRN
  Administered 2014-05-15: 12.5 mg via ORAL
  Filled 2014-05-15: qty 5

## 2014-05-15 NOTE — Progress Notes (Signed)
       Patient Name: Gregory Farrell Date of Encounter: 05/15/2014    SUBJECTIVE:the patient is sleeping. We were asked not to awaken him.  TELEMETRY:  Still having brief bursts of atrial fibrillation that asymptomatic Filed Vitals:   05/14/14 2012 05/14/14 2258 05/15/14 0414 05/15/14 0430  BP: 108/50 114/58 120/53   Pulse: 86 88 144 81  Temp: 99.9 F (37.7 C)  99 F (37.2 C)   TempSrc: Oral  Oral   Resp: 20  20   Height:      Weight:      SpO2: 92%  98%     Intake/Output Summary (Last 24 hours) at 05/15/14 0648 Last data filed at 05/15/14 0413  Gross per 24 hour  Intake    445 ml  Output   1301 ml  Net   -856 ml   LABS: Basic Metabolic Panel:  Recent Labs  05/14/14 0435 05/15/14 0455  NA 139 143  K 3.6* 4.0  CL 104 106  CO2 25 28  GLUCOSE 161* 142*  BUN 5* 6  CREATININE 1.43* 1.53*  CALCIUM 8.2* 8.1*   CBC:  Recent Labs  05/14/14 0435 05/15/14 0455  WBC 5.2 5.3  NEUTROABS 4.1 3.7  HGB 8.8* 10.8*  HCT 27.4* 33.3*  MCV 96.5 95.1  PLT 123* 123*     Radiology/Studies:  No new data  Physical Exam: Blood pressure 120/53, pulse 81, temperature 99 F (37.2 C), temperature source Oral, resp. rate 20, height 5\' 8"  (1.727 m), weight 190 lb 0.6 oz (86.2 kg), SpO2 98 %. Weight change:   Wt Readings from Last 3 Encounters:  05/14/14 190 lb 0.6 oz (86.2 kg)  05/05/14 179 lb (81.194 kg)  05/03/14 180 lb 9.6 oz (81.92 kg)    Normal sinus rhythm based on regular rate and rhythm Chest is clear anteriorly Patient asleep and did not awaken during examination  ASSESSMENT:  1. Atrial fibrillation, paroxysmal, slowly coming under control as amiodarone loads. 2. Anticoagulation with apixaban  Plan:  Continue current therapy including oral amiodarone loading. I would recommend 5-7 days of 400 mg twice a day before decreasing to 400 mg daily for 2 weeks then to 200 mg daily.  Demetrios Isaacs 05/15/2014, 6:48 AM

## 2014-05-15 NOTE — Progress Notes (Signed)
Gregory Farrell got his 2 units of blood yesterday. He tolerated this well. He ate actually looks a little better. He says that his appetite is slowly coming back. He really is not having odynophagia. I will stop his Duragesic patch and we will see how he does with this.  He has some slightly decreased swelling in the right arm. He is on ELIQUIS. There is no bleeding.  He said he had a bowel movement yesterday.  He just sounds better. He sounds a little bit more alert.  It is hard to say how much he really is eating.  On his labs, his hemoglobin is now to 10.8. White cell count 5.3. platelet count 123.his creatinine is 1.53.  On his physical exam, he is afebrile. Temperature is 90.9. Pulse 81. Blood pressure 120/53.  His lungs sound clear. Some scattered wheezes are noted. Cardiac exam regular rate and rhythm. Has a 1/6 systolic murmur. Abdomen is soft. He has good bowel sounds. Extremities shows a mild pitting edema of his right arm. This might be a little bit improved.  Again, he is making slow progress. I suspect she will need a skilled nursing facility. He still needs a lot of help with strength. Physical therapy is helping with this.  His nutrition is still very important. His calorie count I think finished yesterday or will finish today. We will see how much he is eating.  We will see how he does off the Duragesic patch.  [name]  [name] 1:9

## 2014-05-15 NOTE — Progress Notes (Signed)
[facility] PROGRESS NOTE  Gregory Farrell NKN:[id number] DOB: 28-Feb-1933 DOA: 05/07/2014 PCP: [name], MD  Brief Summary  The patient is a 80 y.o. year-old male with history of stage IIIB squamous cell lung cancer for which he has been undergoing chemotherapy and XRT, COPD, GERD, HTN/HLD, CAD s/p stent 10 years ago, T2DM who presented with generalized weakness and lightheadedness.  He completed his chemo and XRT last week but did not come in regularly for IVF despite nausea and poor appetite.  He states approximately 2 weeks ago, he noticed that his heartbeat was beating irregular intermittently.  The morning of admission, he developed slurred speech, clouded thinking, blurred vision, and dizziness. He has had a-fib with RVR and possibly SVT intermittently and has been started on amiodarone per cardiology.  Stroke work up is on hold because he is too kyphotic to fit in [facility] MRI machine and needs to go to the larger MRI machine at [facility], but cannot transfer until his has fewer episodes of tachycardia.  He is not eating and having dysphagia.  He is very weak.    Assessment/Plan Slurred speech with confusion and blurred vision, now resolved but concerning for possible stroke - failed initial swallow but passed speech therapy's swallow eval on 10/25 - holding  Atorvastatin until able to swallow.  - Continue diabetes management - Unable to do MRI. Daughter does not want to proceed with CT head, and or MRI.  -On eliquis now.   Encephalopathy; resolved likely related to ativan.  Patient is very sensitive to ativan, [place]. Sedatives.  Please avoid sedatives.   Insomnia: Patient very sensitive to sedatives. Would avoid Ambien, ativan. PRN benadryl.  DVT right subclavian vein and axillary vein:  Transition to Eliquis today, Dr Marin Olp aware.   Skin burn post radiation;  Wound care consulted. Local care.    a-fib with RVR 10/25 that responded to amiodarone infusion.  Normal systolic function  and no major valvular dysfunction on ECHO.   - Cycle troponins:  neg - TSH 1.36 - amio and metoprolol per cardiology - on eliquis -  Appreciate cardiology assistance   Dysphagia; post radiation esophagitis.  - Change medications to liquid where possible - GI consult placed by Dr. Marin Olp for endoscopy to rule out stenosis and underlying infectious esophagitis.   -Endoscopy show esophagitis.  -Carafate , PPI.  -Advance diet today.   Acute hypoxic respiratory failure, now resolved.  May have been due to COPD exac.  CT angio chest:  Improving left hilar and infrahilar mass. Persistent mild abnormal soft tissue surrounding the left lower bronchus with improved aeration.  No evidence of PE.   - Continue dulera and prn xopenex  Lower extermity edema may be due to diastolic heart failure or DVT in the setting of malignancy  - Lower extremity duplex:  Negative for DVT - ECHO as above  Mild Acute on chronic kidney disease stage 3, likely prerenal given history and resolved with IVF Reviewing records patient cr trend 1.5 to 1.6 a month ago.  Follow trend.   Acute urinary retention -  Started flomax - Bladder scan PRN.   Constipation, stable, continue lactulose  CAD, chest pain free  - Continue ASA, BB, statin per cardiology  T2DM, CBG now somewhat low today and not eating - A1c 7.6 - hold levemir blood sugar in the 110 range.  -  Continue low dose SSI  Squamous cell lung CA  - Appreciate oncology assistance    Pancytopenia due to recent chemotherapy -  Resolved.  - Neupogen daily, started 10-28   Severe protein calorie malnutrition -  Started  Mirtazapine, celexa discontinue.  -  Continue supplements and liberalized diet  - Nutrition consultation   Dehydration with orthostatic hypotension, resolved with IVF.    Generalized weakness, - PT/OT    Diet:  regular Access: port  IVF:  Off Proph: lovenox   Code Status: FULL  Family Communication: patient  Disposition  Plan:  Continue telemetry   Consultants:  Cardiology  Procedures:  CXR  CT angio chest  Duplex lower extremities  ECHO  MRI/MRA  Antibiotics:  none   HPI/Subjective: He is feeling better today. No complaints, no pain. He is alert in no distress.    Objective: Filed Vitals:   05/14/14 2258 05/15/14 0414 05/15/14 0430 05/15/14 0906  BP: 114/58 120/53    Pulse: 88 144 81   Temp:  99 F (37.2 C)    TempSrc:  Oral    Resp:  20    Height:      Weight:      SpO2:  98%  96%    Intake/Output Summary (Last 24 hours) at 05/15/14 1353 Last data filed at 05/15/14 1200  Gross per 24 hour  Intake    335 ml  Output   1175 ml  Net   -840 ml   Filed Weights   05/12/14 0500 05/13/14 0500 05/14/14 0641  Weight: 83.7 kg (184 lb 8.4 oz) 86.1 kg (189 lb 13.1 oz) 86.2 kg (190 lb 0.6 oz)    Exam:   General:   No acute distress  Cardiovascular:  RRR, nl S1, S2 no mrg, 2+ pulses, warm extremities  Respiratory:   no wheezes or rhonchi, no increased WOB  Abdomen:   NABS, soft, NT/ND  MSK:   Normal tone and bulk, trace bilateral LEE, 1+ swelling RUE (lying in right side)  Neuro: non focal.   Data Reviewed: Basic Metabolic Panel:  Recent Labs Lab 05/11/14 0600 05/12/14 0545 05/13/14 0546 05/14/14 0435 05/15/14 0455  NA 137 135* 138 139 143  K 3.8 3.3* 4.1 3.6* 4.0  CL 103 100 104 104 106  CO2 24 25 24 25 28   GLUCOSE 116* 134* 121* 161* 142*  BUN 9 8 6  5* 6  CREATININE 1.22 1.27 1.33 1.43* 1.53*  CALCIUM 7.9* 7.9* 7.9* 8.2* 8.1*   Liver Function Tests: No results for input(s): AST, ALT, ALKPHOS, BILITOT, PROT, ALBUMIN in the last 168 hours. No results for input(s): LIPASE, AMYLASE in the last 168 hours. No results for input(s): AMMONIA in the last 168 hours. CBC:  Recent Labs Lab 05/11/14 0600 05/12/14 0545 05/13/14 0546 05/14/14 0435 05/15/14 0455  WBC 1.2* 3.8* 5.9 5.2 5.3  NEUTROABS 0.8* 3.1 4.7 4.1 3.7  HGB 9.9* 9.5* 9.0* 8.8* 10.8*  HCT  30.5* 29.5* 27.8* 27.4* 33.3*  MCV 94.1 95.5 94.6 96.5 95.1  PLT 126* 116* 132* 123* 123*   Cardiac Enzymes: No results for input(s): CKTOTAL, CKMB, CKMBINDEX, TROPONINI in the last 168 hours. BNP (last 3 results) No results for input(s): PROBNP in the last 8760 hours. CBG:  Recent Labs Lab 05/14/14 1648 05/14/14 2057 05/15/14 0408 05/15/14 0859 05/15/14 1128  GLUCAP 136* 141* 146* 141* 139*    No results found for this or any previous visit (from the past 240 hour(s)).   Studies: No results found.  Scheduled Meds: . amiodarone  400 mg Oral BID  . apixaban  10 mg Oral BID  . [START ON  05/20/2014] apixaban  5 mg Oral BID  . dronabinol  2.5 mg Oral BID AC  . insulin aspart  0-5 Units Subcutaneous QHS  . insulin aspart  0-9 Units Subcutaneous TID WC  . lactulose  20 g Oral BID  . metoCLOPramide  10 mg Oral TID AC  . metoprolol tartrate  12.5 mg Oral BID  . mirtazapine  15 mg Oral QHS  . mometasone-formoterol  2 puff Inhalation BID  . ondansetron  4 mg Oral 3 times per day  . pantoprazole sodium  40 mg Oral BID  . sodium chloride  3 mL Intravenous Q12H  . sucralfate  1 g Oral TID WC & HS  . tamsulosin  0.4 mg Oral QPC supper   Continuous Infusions:    Active Problems:   Diabetes mellitus, type 2   Coronary atherosclerosis   Lung cancer, hilus   Dehydration   Acute respiratory failure with hypoxia   Elevated d-dimer   Tachycardia   Generalized weakness   Orthostatic hypotension   Atrial fibrillation   Protein-calorie malnutrition, severe   Cancer    Time spent: 30 min    Gokul Waybright, [facility] Pager [phone]. If 7PM-7AM, please contact night-coverage at [email or web address], password [facility] 05/15/2014, 1:53 PM  LOS: 8 days

## 2014-05-16 ENCOUNTER — Ambulatory Visit: Payer: Medicare HMO

## 2014-05-16 ENCOUNTER — Telehealth: Payer: Self-pay | Admitting: Oncology

## 2014-05-16 DIAGNOSIS — Z79899 Other long term (current) drug therapy: Secondary | ICD-10-CM

## 2014-05-16 DIAGNOSIS — I25118 Atherosclerotic heart disease of native coronary artery with other forms of angina pectoris: Secondary | ICD-10-CM

## 2014-05-16 DIAGNOSIS — I480 Paroxysmal atrial fibrillation: Secondary | ICD-10-CM

## 2014-05-16 LAB — GLUCOSE, CAPILLARY
GLUCOSE-CAPILLARY: 171 mg/dL — AB (ref 70–99)
GLUCOSE-CAPILLARY: 134 mg/dL — AB (ref 70–99)
GLUCOSE-CAPILLARY: 176 mg/dL — AB (ref 70–99)
GLUCOSE-CAPILLARY: 194 mg/dL — AB (ref 70–99)
Glucose-Capillary: 147 mg/dL — ABNORMAL HIGH (ref 70–99)
Glucose-Capillary: 199 mg/dL — ABNORMAL HIGH (ref 70–99)

## 2014-05-16 LAB — CBC WITH DIFFERENTIAL/PLATELET
BASOS PCT: 1 % (ref 0–1)
Basophils Absolute: 0 10*3/uL (ref 0.0–0.1)
EOS PCT: 0 % (ref 0–5)
Eosinophils Absolute: 0 10*3/uL (ref 0.0–0.7)
HCT: 32.7 % — ABNORMAL LOW (ref 39.0–52.0)
HEMOGLOBIN: 10.3 g/dL — AB (ref 13.0–17.0)
LYMPHS PCT: 14 % (ref 12–46)
Lymphs Abs: 0.7 10*3/uL (ref 0.7–4.0)
MCH: 30.7 pg (ref 26.0–34.0)
MCHC: 31.5 g/dL (ref 30.0–36.0)
MCV: 97.6 fL (ref 78.0–100.0)
MONOS PCT: 24 % — AB (ref 3–12)
Monocytes Absolute: 1.2 10*3/uL — ABNORMAL HIGH (ref 0.1–1.0)
Neutro Abs: 2.9 10*3/uL (ref 1.7–7.7)
Neutrophils Relative %: 61 % (ref 43–77)
PLATELETS: 99 10*3/uL — AB (ref 150–400)
RBC: 3.35 MIL/uL — AB (ref 4.22–5.81)
RDW: 20.5 % — ABNORMAL HIGH (ref 11.5–15.5)
WBC: 4.8 10*3/uL (ref 4.0–10.5)

## 2014-05-16 LAB — BASIC METABOLIC PANEL
Anion gap: 11 (ref 5–15)
BUN: 12 mg/dL (ref 6–23)
CHLORIDE: 101 meq/L (ref 96–112)
CO2: 27 mEq/L (ref 19–32)
Calcium: 8 mg/dL — ABNORMAL LOW (ref 8.4–10.5)
Creatinine, Ser: 1.62 mg/dL — ABNORMAL HIGH (ref 0.50–1.35)
GFR calc Af Amer: 45 mL/min — ABNORMAL LOW (ref >90)
GFR calc non Af Amer: 39 mL/min — ABNORMAL LOW (ref >90)
Glucose, Bld: 164 mg/dL — ABNORMAL HIGH (ref 70–99)
Potassium: 3.9 mEq/L (ref 3.7–5.3)
SODIUM: 139 meq/L (ref 137–147)

## 2014-05-16 LAB — ERYTHROPOIETIN: Erythropoietin: 64.4 m[IU]/mL — ABNORMAL HIGH (ref 2.6–18.5)

## 2014-05-16 MED ORDER — SODIUM CHLORIDE 0.9 % IV SOLN
INTRAVENOUS | Status: DC
  Administered 2014-05-16: 12:00:00 via INTRAVENOUS

## 2014-05-16 MED ORDER — ZOLPIDEM TARTRATE 5 MG PO TABS
5.0000 mg | ORAL_TABLET | Freq: Every evening | ORAL | Status: DC | PRN
  Administered 2014-05-16: 5 mg via ORAL
  Filled 2014-05-16: qty 1

## 2014-05-16 NOTE — Progress Notes (Signed)
Physical Therapy Treatment Patient Details Name: Gregory Farrell MRN: [id number] DOB: 12-29-1932 Today's Date: 05/16/2014    History of Present Illness Pt is a 80 yo male admitted with h/o squamous cell Stage IIIB lung CA, COPD, GERD, has been on chemo until this admission, CAD, HTN and DM II. Pt admitted with lightheadedness, weakness and abdominal pain.  Pt found to be orthstatic, dehydtrated and with afib.  Pt also with 2 vessel obstructive coronary disease.      PT Comments    Pt progressing poorly and required + 2 assist for ambulation limited distance.  Very unsteady shaky gait with third assist following with recliner.  Pt was hoping to D/C to home but realizes and admitted he is weak.   Spoke with daughter on phone and she agrees.  Pt will need ST Rehab at SNF before returning home. Also consulted SW after session to update.   Follow Up Recommendations  SNF (pt progressing poorly and will need ST Rehab.  Will consult LPT and SW.)     Equipment Recommendations       Recommendations for Other Services       Precautions / Restrictions Precautions Precautions: Fall Precaution Comments: A Fob monitor HR Restrictions Weight Bearing Restrictions: No    Mobility  Bed Mobility Overal bed mobility: Needs Assistance Bed Mobility: Supine to Sit     Supine to sit: Mod assist     General bed mobility comments: Increased time and HOB elevated 45 degrees.  Increased difficulty scooting and MAX c/o fatigue.  Transfers Overall transfer level: Needs assistance Equipment used: Rolling walker (2 wheeled) Transfers: Sit to/from Stand Sit to Stand: +2 physical assistance;Mod assist         General transfer comment: Pt required increased assist.  Very shaky and MAX c/o weakness.  Ambulation/Gait Ambulation/Gait assistance: +2 physical assistance;+2 safety/equipment;Min assist;Mod assist Ambulation Distance (Feet): 44 Feet (22 feet x 2) Assistive device: Rolling walker (2  wheeled) Gait Pattern/deviations: Step-through pattern;Trunk flexed Gait velocity: decreased   General Gait Details: Pt NOT progress as well as he had hoped.  Very shaky unsteady gait requiring + 2 asssit and a third person to follow with recliner for fatigue.  Pt demon limited activity tolerance.  Resting HR was 97 prior to activity and only increased to 118.  DOE is 3/4.  Very unsteady gait requiring 50% VC's on proper walker to self distance and upright posture.  50% VC's on purse lip breathing as pt demon SOB.  HIGH FALL RISK.   Stairs            Wheelchair Mobility    Modified Rankin (Stroke Patients Only)       Balance                                    Cognition                            Exercises      General Comments        Pertinent Vitals/Pain      Home Living                      Prior Function            PT Goals (current goals can now be found in the care plan section) Progress towards PT goals:  Progressing toward goals (slowly but not enough to return home)    Frequency  Min 3X/week    PT Plan      Co-evaluation             End of Session Equipment Utilized During Treatment: Gait belt Activity Tolerance: Patient limited by fatigue Patient left: in chair;with call bell/phone within reach;with family/visitor present;with nursing/sitter in room;with chair alarm set     Time: 3154-0086 PT Time Calculation (min): 31 min  Charges:  $Gait Training: 8-22 mins $Therapeutic Activity: 8-22 mins                    G CodesRica Koyanagi  PTA [facility]  Acute  Rehab Pager      [phone]  05/16/14 In agreement with updated follow-up recommendation for SNF for continued rehab.   Weston Anna, MPT [phone]

## 2014-05-16 NOTE — Progress Notes (Signed)
Calorie Count Note - Day 2   Intervention:  -Pt with steady gradual improvement in appetite, has increased from 14-30% est kcal needs intake, and 10-18% est protein needs intake over past two days -Continue with appetite stimulant and family encouragement (has also been bringing in outside food sources) -Ensure Pudding supplement PRN   48 hour calorie count ordered.  Diet: Soft Supplements: Ensure Pudding po PRN, each supplement provides 170 kcal and 4 grams of protein  Estimated nutrition needs: Kcal: 2200-2400  Protein: 100-115 gram  Fluid: >/= 2200 ml daily   Breakfast: 220 kcal, 13 gram Lunch: 190 kcal, 3 gram Dinner: 250 kcal, 2 gram Supplements: 0%  Total intake: 660 kcal (30% of minimum estimated needs)  18 gram protein (18% of minimum estimated needs)  Nutrition Dx: Inadequate oral intake related to lack of appetite as evidenced by PO intake < 75%'; continues, gradually improving  Goal: Pt to meet >/= 90% of their estimated nutrition needs; gradually progressing   Atlee Abide MS RD LDN Clinical Dietitian [phone]

## 2014-05-16 NOTE — Progress Notes (Addendum)
Subjective: Breathing is OK  No CP   Objective: Filed Vitals:   05/15/14 0906 05/15/14 1500 05/15/14 2036 05/16/14 0405  BP:  98/58 137/55 119/54  Pulse:  74 86 72  Temp:  98.3 F (36.8 C) 98.6 F (37 C) 98.1 F (36.7 C)  TempSrc:  Oral Oral Oral  Resp:  18 16 16   Height:      Weight:    195 lb 5.2 oz (88.6 kg)  SpO2: 96% 98% 96% 94%   Weight change:   Intake/Output Summary (Last 24 hours) at 05/16/14 0746 Last data filed at 05/15/14 1754  Gross per 24 hour  Intake     50 ml  Output    125 ml  Net    -75 ml    General: Alert, awake, oriented x3, in no acute distress Neck:  JVP is normal Heart: Regular rate and rhythm, without murmurs, rubs, gallops.  Lungs: Clear to auscultation.  No rales or wheezes. Exemities:  Tr edema.   Neuro: Grossly intact, nonfocal.  Tele:  SR   Lab Results: Results for orders placed or performed during the hospital encounter of 05/07/14 (from the past 24 hour(s))  Glucose, capillary     Status: Abnormal   Collection Time: 05/15/14  8:59 AM  Result Value Ref Range   Glucose-Capillary 141 (H) 70 - 99 mg/dL  Glucose, capillary     Status: Abnormal   Collection Time: 05/15/14 11:28 AM  Result Value Ref Range   Glucose-Capillary 139 (H) 70 - 99 mg/dL  Urinalysis, Routine w reflex microscopic     Status: Abnormal   Collection Time: 05/15/14  3:38 PM  Result Value Ref Range   Color, Urine YELLOW YELLOW   APPearance CLOUDY (A) CLEAR   Specific Gravity, Urine 1.016 1.005 - 1.030   pH 6.5 5.0 - 8.0   Glucose, UA NEGATIVE NEGATIVE mg/dL   Hgb urine dipstick MODERATE (A) NEGATIVE   Bilirubin Urine NEGATIVE NEGATIVE   Ketones, ur NEGATIVE NEGATIVE mg/dL   Protein, ur NEGATIVE NEGATIVE mg/dL   Urobilinogen, UA 1.0 0.0 - 1.0 mg/dL   Nitrite NEGATIVE NEGATIVE   Leukocytes, UA SMALL (A) NEGATIVE  Urine microscopic-add on     Status: Abnormal   Collection Time: 05/15/14  3:38 PM  Result Value Ref Range   WBC, UA 11-20 <3 WBC/hpf   RBC / HPF  21-50 <3 RBC/hpf   Bacteria, UA MANY (A) RARE   Urine-Other MUCOUS PRESENT   Glucose, capillary     Status: Abnormal   Collection Time: 05/15/14  4:51 PM  Result Value Ref Range   Glucose-Capillary 109 (H) 70 - 99 mg/dL  Glucose, capillary     Status: Abnormal   Collection Time: 05/15/14  7:45 PM  Result Value Ref Range   Glucose-Capillary 181 (H) 70 - 99 mg/dL  Glucose, capillary     Status: Abnormal   Collection Time: 05/16/14 12:24 AM  Result Value Ref Range   Glucose-Capillary 147 (H) 70 - 99 mg/dL  Glucose, capillary     Status: Abnormal   Collection Time: 05/16/14  4:01 AM  Result Value Ref Range   Glucose-Capillary 171 (H) 70 - 99 mg/dL  Basic metabolic panel     Status: Abnormal   Collection Time: 05/16/14  4:55 AM  Result Value Ref Range   Sodium 139 137 - 147 mEq/L   Potassium 3.9 3.7 - 5.3 mEq/L   Chloride 101 96 - 112 mEq/L   CO2 27 19 -  32 mEq/L   Glucose, Bld 164 (H) 70 - 99 mg/dL   BUN 12 6 - 23 mg/dL   Creatinine, Ser 1.62 (H) 0.50 - 1.35 mg/dL   Calcium 8.0 (L) 8.4 - 10.5 mg/dL   GFR calc non Af Amer 39 (L) >90 mL/min   GFR calc Af Amer 45 (L) >90 mL/min   Anion gap 11 5 - 15  CBC with Differential     Status: Abnormal   Collection Time: 05/16/14  4:55 AM  Result Value Ref Range   WBC 4.8 4.0 - 10.5 K/uL   RBC 3.35 (L) 4.22 - 5.81 MIL/uL   Hemoglobin 10.3 (L) 13.0 - 17.0 g/dL   HCT 32.7 (L) 39.0 - 52.0 %   MCV 97.6 78.0 - 100.0 fL   MCH 30.7 26.0 - 34.0 pg   MCHC 31.5 30.0 - 36.0 g/dL   RDW 20.5 (H) 11.5 - 15.5 %   Platelets 99 (L) 150 - 400 K/uL   Neutrophils Relative % 61 43 - 77 %   Lymphocytes Relative 14 12 - 46 %   Monocytes Relative 24 (H) 3 - 12 %   Eosinophils Relative 0 0 - 5 %   Basophils Relative 1 0 - 1 %   Neutro Abs 2.9 1.7 - 7.7 K/uL   Lymphs Abs 0.7 0.7 - 4.0 K/uL   Monocytes Absolute 1.2 (H) 0.1 - 1.0 K/uL   Eosinophils Absolute 0.0 0.0 - 0.7 K/uL   Basophils Absolute 0.0 0.0 - 0.1 K/uL   RBC Morphology POLYCHROMASIA PRESENT      WBC Morphology MILD LEFT SHIFT (1-5% METAS, OCC MYELO, OCC BANDS)     Studies/Results: No results found.  Medications:Reviewed   @PROBHOSP @  1.  PAF  patinet remains in SR  Keep on amio bid for 7 days then decreased to 400 qd for 2 wks then 200 qd.  Keep on apixiban   Will need to follow HR  2.  CAD  No symptoms to sugg angina  3.  HTN  Continue meds  4.  Lung CA  Dr Marin Olp following.  WIll make sure he has cardiology F/u    LOS: 9 days   Dorris Carnes 05/16/2014, 7:46 AM

## 2014-05-16 NOTE — Telephone Encounter (Signed)
Called Katharine Look, Gregory Farrell's daughter,  to see if he wants radiation today.  She said he is feeling beter and will be discharged Tuesday or Wednesday.  Per Katharine Look "he is done" with radiation.  Advised her to call if they need anything.  Katharine Look verbalized agreement.  Notified Heather RT on [place] 3 to cancel treatment today.

## 2014-05-16 NOTE — Progress Notes (Signed)
RN texted PCP to see if CBG checks would be taken ACHS if appropriate, as Insulin sliding scale is ACHS.  RN awaiting new orders.

## 2014-05-16 NOTE — Progress Notes (Signed)
OT Cancellation Note  Patient Details Name: ARSHDEEP BOLGER MRN: [id number] DOB: March 09, 1933   Cancelled Treatment:    Reason Eval/Treat Not Completed: Other (comment) Pt stating he doesn't feel good right now and declines working with OT. He states he feels weak and explained the purpose of working with OT to progress strength and independence. Pt requesting to work with OT another time/day.  Jules Schick  [phone] 05/16/2014, 12:07 PM

## 2014-05-16 NOTE — Progress Notes (Signed)
[facility] PROGRESS NOTE  Gregory Farrell UKG:[id number] DOB: 06-03-1933 DOA: 05/07/2014 PCP: [name], MD  Brief Summary  The patient is a 80 y.o. year-old male with history of stage IIIB squamous cell lung cancer for which he has been undergoing chemotherapy and XRT, COPD, GERD, HTN/HLD, CAD s/p stent 10 years ago, T2DM who presented with generalized weakness and lightheadedness.  He completed his chemo and XRT last week but did not come in regularly for IVF despite nausea and poor appetite.  He states approximately 2 weeks ago, he noticed that his heartbeat was beating irregular intermittently.  The morning of admission, he developed slurred speech, clouded thinking, blurred vision, and dizziness. He has had a-fib with RVR and possibly SVT intermittently and has been started on amiodarone per cardiology.  Stroke work up is on hold because he is too kyphotic to fit in [facility] MRI machine and needs to go to the larger MRI machine at [facility], but cannot transfer until his has fewer episodes of tachycardia.  He is not eating and having dysphagia.  He is very weak.    Assessment/Plan Slurred speech with confusion and blurred vision, now resolved but concerning for possible stroke - failed initial swallow but passed speech therapy's swallow eval on 10/25 - holding  Atorvastatin until able to swallow.  - Continue diabetes management - Unable to do MRI. Daughter does not want to proceed with CT head, and or MRI.  -On eliquis now.   Encephalopathy; resolved likely related to ativan.  Patient is very sensitive to ativan. . Sedatives.  Please avoid sedatives.   Insomnia: Patient very sensitive to sedatives. Would avoid ativan. Family and patient ok to try ambien/   DVT right subclavian vein and axillary vein:  Transition to Eliquis today, Dr Marin Olp aware.   Skin burn post radiation;  Wound care consulted. Local care.    a-fib with RVR 10/25 that responded to amiodarone infusion.  Normal systolic  function and no major valvular dysfunction on ECHO.   - Cycle troponins:  neg - TSH 1.36 - amio and metoprolol per cardiology - on eliquis -  Appreciate cardiology assistance   Dysphagia; post radiation esophagitis.  - Change medications to liquid where possible - GI consult placed by Dr. Marin Olp for endoscopy to rule out stenosis and underlying infectious esophagitis.   -Endoscopy show esophagitis.  -Carafate , PPI.  -Advance diet today.   Acute hypoxic respiratory failure, now resolved.  May have been due to COPD exac.  CT angio chest:  Improving left hilar and infrahilar mass. Persistent mild abnormal soft tissue surrounding the left lower bronchus with improved aeration.  No evidence of PE.   - Continue dulera and prn xopenex  Lower extermity edema may be due to diastolic heart failure or DVT in the setting of malignancy  - Lower extremity duplex:  Negative for DVT - ECHO as above  Mild Acute on chronic kidney disease stage 3, likely prerenal given history and resolved with IVF Reviewing records patient cr trend 1.5 to 1.6 a month ago.  Mildly increase cr today. Will check urine culture. Will give fluids for 4 hour.   Acute urinary retention -  Started flomax - Bladder scan PRN.   Constipation, stable, continue lactulose  CAD, chest pain free  - Continue ASA, BB, statin per cardiology  T2DM, CBG now somewhat low today and not eating - A1c 7.6 - hold levemir blood sugar in the 110 range.  -  Continue low dose SSI  Squamous cell lung CA  - Appreciate oncology assistance    Pancytopenia due to recent chemotherapy -Resolved.  - Neupogen daily, started 10-28   Severe protein calorie malnutrition -  Started  Mirtazapine, celexa discontinue.  -  Continue supplements and liberalized diet  - Nutrition consultation   Dehydration with orthostatic hypotension, resolved with IVF.    Generalized weakness, - PT/OT    Diet:  regular Access: port  IVF:  Off Proph:  lovenox   Code Status: FULL  Family Communication: patient  Disposition Plan:  Continue telemetry   Consultants:  Cardiology  Procedures:  CXR  CT angio chest  Duplex lower extremities  ECHO  MRI/MRA  Antibiotics:  none   HPI/Subjective: He was not able to sleep last night. Benadryl didn't help.  He wants to try [place]. He and his daughter understands possibility for adverse effect, confusion, lethargic..    Objective: Filed Vitals:   05/15/14 0906 05/15/14 1500 05/15/14 2036 05/16/14 0405  BP:  98/58 137/55 119/54  Pulse:  74 86 72  Temp:  98.3 F (36.8 C) 98.6 F (37 C) 98.1 F (36.7 C)  TempSrc:  Oral Oral Oral  Resp:  18 16 16   Height:      Weight:    88.6 kg (195 lb 5.2 oz)  SpO2: 96% 98% 96% 94%    Intake/Output Summary (Last 24 hours) at 05/16/14 1032 Last data filed at 05/16/14 0841  Gross per 24 hour  Intake     40 ml  Output    205 ml  Net   -165 ml   Filed Weights   05/13/14 0500 05/14/14 0641 05/16/14 0405  Weight: 86.1 kg (189 lb 13.1 oz) 86.2 kg (190 lb 0.6 oz) 88.6 kg (195 lb 5.2 oz)    Exam:   General:   No acute distress  Cardiovascular:  RRR, nl S1, S2 no mrg, 2+ pulses, warm extremities  Respiratory:   no wheezes or rhonchi, no increased WOB  Abdomen:   NABS, soft, NT/ND  MSK:   Normal tone and bulk, trace bilateral LEE, 1+ swelling RUE (lying in right side)  Neuro: non focal.   Data Reviewed: Basic Metabolic Panel:  Recent Labs Lab 05/12/14 0545 05/13/14 0546 05/14/14 0435 05/15/14 0455 05/16/14 0455  NA 135* 138 139 143 139  K 3.3* 4.1 3.6* 4.0 3.9  CL 100 104 104 106 101  CO2 25 24 25 28 27   GLUCOSE 134* 121* 161* 142* 164*  BUN 8 6 5* 6 12  CREATININE 1.27 1.33 1.43* 1.53* 1.62*  CALCIUM 7.9* 7.9* 8.2* 8.1* 8.0*   Liver Function Tests: No results for input(s): AST, ALT, ALKPHOS, BILITOT, PROT, ALBUMIN in the last 168 hours. No results for input(s): LIPASE, AMYLASE in the last 168 hours. No results  for input(s): AMMONIA in the last 168 hours. CBC:  Recent Labs Lab 05/12/14 0545 05/13/14 0546 05/14/14 0435 05/15/14 0455 05/16/14 0455  WBC 3.8* 5.9 5.2 5.3 4.8  NEUTROABS 3.1 4.7 4.1 3.7 2.9  HGB 9.5* 9.0* 8.8* 10.8* 10.3*  HCT 29.5* 27.8* 27.4* 33.3* 32.7*  MCV 95.5 94.6 96.5 95.1 97.6  PLT 116* 132* 123* 123* 99*   Cardiac Enzymes: No results for input(s): CKTOTAL, CKMB, CKMBINDEX, TROPONINI in the last 168 hours. BNP (last 3 results) No results for input(s): PROBNP in the last 8760 hours. CBG:  Recent Labs Lab 05/15/14 1651 05/15/14 1945 05/16/14 0024 05/16/14 0401 05/16/14 0802  GLUCAP 109* 181* 147* 171* 134*  No results found for this or any previous visit (from the past 240 hour(s)).   Studies: No results found.  Scheduled Meds: . amiodarone  400 mg Oral BID  . apixaban  10 mg Oral BID  . [START ON 05/20/2014] apixaban  5 mg Oral BID  . dronabinol  2.5 mg Oral BID AC  . insulin aspart  0-5 Units Subcutaneous QHS  . insulin aspart  0-9 Units Subcutaneous TID WC  . lactulose  20 g Oral BID  . metoCLOPramide  10 mg Oral TID AC  . metoprolol tartrate  12.5 mg Oral BID  . mirtazapine  15 mg Oral QHS  . mometasone-formoterol  2 puff Inhalation BID  . ondansetron  4 mg Oral 3 times per day  . pantoprazole sodium  40 mg Oral BID  . sodium chloride  3 mL Intravenous Q12H  . sucralfate  1 g Oral TID WC & HS  . tamsulosin  0.4 mg Oral QPC supper   Continuous Infusions: . sodium chloride      Active Problems:   Diabetes mellitus, type 2   Coronary atherosclerosis   Lung cancer, hilus   Dehydration   Acute respiratory failure with hypoxia   Elevated d-dimer   Generalized weakness   Orthostatic hypotension   Protein-calorie malnutrition, severe   Paroxysmal atrial fibrillation   On amiodarone therapy    Time spent: 30 min    Regalado, [facility] Pager [phone]. If 7PM-7AM, please contact night-coverage at [email or web address],  password [facility] 05/16/2014, 10:32 AM  LOS: 9 days

## 2014-05-16 NOTE — Progress Notes (Signed)
Gregory Farrell for apixaban Indication: DVT treatment of RUE/afib  Allergies  Allergen Reactions  . Exenatide     REACTION: nausea    Patient Measurements: Height: 5\' 8"  (172.7 cm) Weight: 195 lb 5.2 oz (88.6 kg) IBW/kg (Calculated) : 68.4  Vital Signs: Temp: 98.1 F (36.7 C) (11/02 0405) Temp Source: Oral (11/02 0405) BP: 109/55 mmHg (11/02 1058) Pulse Rate: 93 (11/02 1058)  Labs:  Recent Labs  05/14/14 0435 05/15/14 0455 05/16/14 0455  HGB 8.8* 10.8* 10.3*  HCT 27.4* 33.3* 32.7*  PLT 123* 123* 99*  CREATININE 1.43* 1.53* 1.62*    Assessment: 81 YOM admitted with weakness and afib (was not on therapeutic anticoagulation pending EGD) and switching from enoxaparin (prophylaxis) to heparin gtt for DVT treatment.   S/p EGD, heparin gtt transitioned to apixaban.  Currently receiving chemo and radiation for lung cancer.   Significant events:  10/28: duplex of RUE reveals DVT (has implanted port in R chest wall), pharmacy asked to dose heparin gtt.  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  GI recommends continuing supportive care.  Started apixaban.  Today, 11/2  Day #4 apixaban 10 mg BID.  Will transition to apixaban 5 mg BID on 11/6 AM.  SCr 1.62, increased.  CrCl~45 ml/min.  For renal impairment, no dosage adjustment is recommended by the manufacturer. However, it should be noted that patients with a serum creatinine >2.5 mg/dL or CrCl <25 mL/minute (as determined by Cockcroft-Gault equation) were excluded from the clinical trials.  SCr trending up but therapy still appropriate.  CBC: Hgb low/stable, Platelets trended down slightly today  No bleeding documented/noted  Education completed with patient, spouse, and daughter.  Goal of Therapy:  VTE treatment  Plan:  1.  Continue apixaban 10mg  PO BID x 7 days then 5mg  PO BID. 2.  F/u SCr.  Hershal Coria, PharmD, BCPS Pager: [phone] 05/16/2014 12:20 PM

## 2014-05-16 NOTE — Progress Notes (Addendum)
Gregory Farrell really looks good this morning. He is much more alert. He is not having any problems with pain. He seems to be eating better. He's not having any odynophagia.  He says he was out of bed and walked a little bit yesterday.  His Atrial fbrillation seems to be doing pretty well. He's on oral amiodarone.  He continues on ELIQUIS for the thromboembolic event in his right arm.  He is off his Duragesic patch. Again, he is not having any problems with pain.  He says he had a bowel movement yesterday.  His labs look pretty good. Hemoglobin is 10.3. White cell count is 4.8 and platelet count is 99. BUN is 12 and creatinine 1.62. His lungs sound clear. Cardiac exam is regular rate and rhythm. Abdomen is soft. Has good bowel sounds.extremities shows the edema in his right arm. This may be a little bit better.  He says he really wants to go home. The way he looks today, I think he probably could go home. He will need physical therapy at home. He may need [facility]. His daughter will have to approve all of this. I will see about trying to talk to her.  Again, over the past couple days, he really has made some nice progress.  He is on Marinol. He needs Marinol when he goes home to try to help with his appetite.  Gregory Farrell  Psalms 25:4-5    ADDENDUM: I spoke to his daughter this morning. She is pleased that he is doing better. She says that it will take 1 or 2 days to get his house ready so he can go home.  He will need home health physical therapy. Can he have somebody to stay with him at nighttime??  Gregory Farrell

## 2014-05-17 ENCOUNTER — Inpatient Hospital Stay (HOSPITAL_COMMUNITY)
Admission: EM | Admit: 2014-05-17 | Discharge: 2014-05-23 | DRG: 871 | Disposition: A | Discharge status: Skilled Nursing Facility | Payer: Medicare HMO | Attending: Internal Medicine | Admitting: Internal Medicine

## 2014-05-17 ENCOUNTER — Encounter (HOSPITAL_COMMUNITY): Payer: Self-pay | Admitting: Emergency Medicine

## 2014-05-17 ENCOUNTER — Inpatient Hospital Stay (HOSPITAL_COMMUNITY): Payer: Medicare HMO

## 2014-05-17 ENCOUNTER — Ambulatory Visit: Payer: Medicare HMO

## 2014-05-17 ENCOUNTER — Encounter: Payer: Self-pay | Admitting: Radiation Oncology

## 2014-05-17 ENCOUNTER — Emergency Department (HOSPITAL_COMMUNITY): Payer: Medicare HMO

## 2014-05-17 DIAGNOSIS — N179 Acute kidney failure, unspecified: Secondary | ICD-10-CM | POA: Diagnosis present

## 2014-05-17 DIAGNOSIS — E43 Unspecified severe protein-calorie malnutrition: Secondary | ICD-10-CM | POA: Diagnosis present

## 2014-05-17 DIAGNOSIS — R319 Hematuria, unspecified: Secondary | ICD-10-CM | POA: Diagnosis present

## 2014-05-17 DIAGNOSIS — D6959 Other secondary thrombocytopenia: Secondary | ICD-10-CM | POA: Diagnosis present

## 2014-05-17 DIAGNOSIS — Z87891 Personal history of nicotine dependence: Secondary | ICD-10-CM

## 2014-05-17 DIAGNOSIS — Z923 Personal history of irradiation: Secondary | ICD-10-CM

## 2014-05-17 DIAGNOSIS — I82B11 Acute embolism and thrombosis of right subclavian vein: Secondary | ICD-10-CM | POA: Diagnosis present

## 2014-05-17 DIAGNOSIS — I129 Hypertensive chronic kidney disease with stage 1 through stage 4 chronic kidney disease, or unspecified chronic kidney disease: Secondary | ICD-10-CM | POA: Diagnosis present

## 2014-05-17 DIAGNOSIS — I5032 Chronic diastolic (congestive) heart failure: Secondary | ICD-10-CM | POA: Diagnosis present

## 2014-05-17 DIAGNOSIS — Z9221 Personal history of antineoplastic chemotherapy: Secondary | ICD-10-CM | POA: Diagnosis not present

## 2014-05-17 DIAGNOSIS — A419 Sepsis, unspecified organism: Principal | ICD-10-CM | POA: Diagnosis present

## 2014-05-17 DIAGNOSIS — F329 Major depressive disorder, single episode, unspecified: Secondary | ICD-10-CM | POA: Diagnosis present

## 2014-05-17 DIAGNOSIS — R627 Adult failure to thrive: Secondary | ICD-10-CM | POA: Diagnosis present

## 2014-05-17 DIAGNOSIS — I2510 Atherosclerotic heart disease of native coronary artery without angina pectoris: Secondary | ICD-10-CM | POA: Diagnosis present

## 2014-05-17 DIAGNOSIS — R310 Gross hematuria: Secondary | ICD-10-CM | POA: Diagnosis present

## 2014-05-17 DIAGNOSIS — Z515 Encounter for palliative care: Secondary | ICD-10-CM | POA: Diagnosis not present

## 2014-05-17 DIAGNOSIS — Z6827 Body mass index (BMI) 27.0-27.9, adult: Secondary | ICD-10-CM | POA: Diagnosis not present

## 2014-05-17 DIAGNOSIS — F32A Depression, unspecified: Secondary | ICD-10-CM | POA: Insufficient documentation

## 2014-05-17 DIAGNOSIS — N183 Chronic kidney disease, stage 3 (moderate): Secondary | ICD-10-CM | POA: Diagnosis present

## 2014-05-17 DIAGNOSIS — Z79899 Other long term (current) drug therapy: Secondary | ICD-10-CM | POA: Diagnosis not present

## 2014-05-17 DIAGNOSIS — Z86718 Personal history of other venous thrombosis and embolism: Secondary | ICD-10-CM

## 2014-05-17 DIAGNOSIS — J449 Chronic obstructive pulmonary disease, unspecified: Secondary | ICD-10-CM | POA: Diagnosis present

## 2014-05-17 DIAGNOSIS — C3401 Malignant neoplasm of right main bronchus: Secondary | ICD-10-CM | POA: Diagnosis present

## 2014-05-17 DIAGNOSIS — Z66 Do not resuscitate: Secondary | ICD-10-CM | POA: Diagnosis present

## 2014-05-17 DIAGNOSIS — R040 Epistaxis: Secondary | ICD-10-CM | POA: Diagnosis present

## 2014-05-17 DIAGNOSIS — I4819 Other persistent atrial fibrillation: Secondary | ICD-10-CM

## 2014-05-17 DIAGNOSIS — R1310 Dysphagia, unspecified: Secondary | ICD-10-CM | POA: Diagnosis present

## 2014-05-17 DIAGNOSIS — E119 Type 2 diabetes mellitus without complications: Secondary | ICD-10-CM | POA: Diagnosis present

## 2014-05-17 DIAGNOSIS — Z794 Long term (current) use of insulin: Secondary | ICD-10-CM

## 2014-05-17 DIAGNOSIS — E46 Unspecified protein-calorie malnutrition: Secondary | ICD-10-CM | POA: Diagnosis present

## 2014-05-17 DIAGNOSIS — E785 Hyperlipidemia, unspecified: Secondary | ICD-10-CM | POA: Diagnosis present

## 2014-05-17 DIAGNOSIS — J9610 Chronic respiratory failure, unspecified whether with hypoxia or hypercapnia: Secondary | ICD-10-CM | POA: Diagnosis present

## 2014-05-17 DIAGNOSIS — IMO0001 Reserved for inherently not codable concepts without codable children: Secondary | ICD-10-CM | POA: Insufficient documentation

## 2014-05-17 DIAGNOSIS — N390 Urinary tract infection, site not specified: Secondary | ICD-10-CM | POA: Diagnosis present

## 2014-05-17 DIAGNOSIS — B379 Candidiasis, unspecified: Secondary | ICD-10-CM

## 2014-05-17 DIAGNOSIS — R509 Fever, unspecified: Secondary | ICD-10-CM | POA: Insufficient documentation

## 2014-05-17 DIAGNOSIS — E871 Hypo-osmolality and hyponatremia: Secondary | ICD-10-CM | POA: Diagnosis present

## 2014-05-17 DIAGNOSIS — I480 Paroxysmal atrial fibrillation: Secondary | ICD-10-CM | POA: Diagnosis present

## 2014-05-17 DIAGNOSIS — R531 Weakness: Secondary | ICD-10-CM

## 2014-05-17 DIAGNOSIS — G4700 Insomnia, unspecified: Secondary | ICD-10-CM | POA: Insufficient documentation

## 2014-05-17 DIAGNOSIS — C3400 Malignant neoplasm of unspecified main bronchus: Secondary | ICD-10-CM | POA: Diagnosis present

## 2014-05-17 DIAGNOSIS — C3402 Malignant neoplasm of left main bronchus: Secondary | ICD-10-CM

## 2014-05-17 LAB — CBC WITH DIFFERENTIAL/PLATELET
Basophils Absolute: 0 10*3/uL (ref 0.0–0.1)
Basophils Absolute: 0 K/uL (ref 0.0–0.1)
Basophils Relative: 0 % (ref 0–1)
Basophils Relative: 0 % (ref 0–1)
EOS PCT: 0 % (ref 0–5)
Eosinophils Absolute: 0 10*3/uL (ref 0.0–0.7)
Eosinophils Absolute: 0 K/uL (ref 0.0–0.7)
Eosinophils Relative: 0 % (ref 0–5)
HCT: 32.4 % — ABNORMAL LOW (ref 39.0–52.0)
HCT: 35.5 % — ABNORMAL LOW (ref 39.0–52.0)
Hemoglobin: 10.5 g/dL — ABNORMAL LOW (ref 13.0–17.0)
Hemoglobin: 11.4 g/dL — ABNORMAL LOW (ref 13.0–17.0)
LYMPHS PCT: 15 % (ref 12–46)
Lymphocytes Relative: 3 % — ABNORMAL LOW (ref 12–46)
Lymphs Abs: 0.7 10*3/uL (ref 0.7–4.0)
Lymphs Abs: 0.2 K/uL — ABNORMAL LOW (ref 0.7–4.0)
MCH: 31.5 pg (ref 26.0–34.0)
MCH: 30.6 pg (ref 26.0–34.0)
MCHC: 32.4 g/dL (ref 30.0–36.0)
MCHC: 32.1 g/dL (ref 30.0–36.0)
MCV: 97.3 fL (ref 78.0–100.0)
MCV: 95.4 fL (ref 78.0–100.0)
MONOS PCT: 27 % — AB (ref 3–12)
Monocytes Absolute: 1.2 10*3/uL — ABNORMAL HIGH (ref 0.1–1.0)
Monocytes Absolute: 0.3 K/uL (ref 0.1–1.0)
Monocytes Relative: 5 % (ref 3–12)
NEUTROS PCT: 58 % (ref 43–77)
Neutro Abs: 2.7 10*3/uL (ref 1.7–7.7)
Neutro Abs: 6.3 K/uL (ref 1.7–7.7)
Neutrophils Relative %: 92 % — ABNORMAL HIGH (ref 43–77)
PLATELETS: 74 10*3/uL — AB (ref 150–400)
Platelets: 71 K/uL — ABNORMAL LOW (ref 150–400)
RBC: 3.33 MIL/uL — AB (ref 4.22–5.81)
RBC: 3.72 MIL/uL — ABNORMAL LOW (ref 4.22–5.81)
RDW: 20.4 % — ABNORMAL HIGH (ref 11.5–15.5)
RDW: 19.7 % — ABNORMAL HIGH (ref 11.5–15.5)
WBC: 4.6 10*3/uL (ref 4.0–10.5)
WBC: 6.8 K/uL (ref 4.0–10.5)

## 2014-05-17 LAB — COMPREHENSIVE METABOLIC PANEL WITH GFR
ALT: 14 U/L (ref 0–53)
AST: 20 U/L (ref 0–37)
Albumin: 2.1 g/dL — ABNORMAL LOW (ref 3.5–5.2)
Alkaline Phosphatase: 99 U/L (ref 39–117)
Anion gap: 13 (ref 5–15)
BUN: 16 mg/dL (ref 6–23)
CO2: 24 meq/L (ref 19–32)
Calcium: 8.2 mg/dL — ABNORMAL LOW (ref 8.4–10.5)
Chloride: 97 meq/L (ref 96–112)
Creatinine, Ser: 1.59 mg/dL — ABNORMAL HIGH (ref 0.50–1.35)
GFR calc Af Amer: 46 mL/min — ABNORMAL LOW
GFR calc non Af Amer: 40 mL/min — ABNORMAL LOW
Glucose, Bld: 154 mg/dL — ABNORMAL HIGH (ref 70–99)
Potassium: 4.1 meq/L (ref 3.7–5.3)
Sodium: 134 meq/L — ABNORMAL LOW (ref 137–147)
Total Bilirubin: 0.6 mg/dL (ref 0.3–1.2)
Total Protein: 5.5 g/dL — ABNORMAL LOW (ref 6.0–8.3)

## 2014-05-17 LAB — BASIC METABOLIC PANEL
ANION GAP: 11 (ref 5–15)
BUN: 16 mg/dL (ref 6–23)
CO2: 26 mEq/L (ref 19–32)
Calcium: 8.1 mg/dL — ABNORMAL LOW (ref 8.4–10.5)
Chloride: 101 mEq/L (ref 96–112)
Creatinine, Ser: 1.64 mg/dL — ABNORMAL HIGH (ref 0.50–1.35)
GFR, EST AFRICAN AMERICAN: 44 mL/min — AB (ref >90)
GFR, EST NON AFRICAN AMERICAN: 38 mL/min — AB (ref >90)
Glucose, Bld: 129 mg/dL — ABNORMAL HIGH (ref 70–99)
Potassium: 4 mEq/L (ref 3.7–5.3)
Sodium: 138 mEq/L (ref 137–147)

## 2014-05-17 LAB — GLUCOSE, CAPILLARY
Glucose-Capillary: 144 mg/dL — ABNORMAL HIGH (ref 70–99)
Glucose-Capillary: 145 mg/dL — ABNORMAL HIGH (ref 70–99)
Glucose-Capillary: 254 mg/dL — ABNORMAL HIGH (ref 70–99)

## 2014-05-17 LAB — I-STAT CG4 LACTIC ACID, ED: Lactic Acid, Venous: 1.34 mmol/L (ref 0.5–2.2)

## 2014-05-17 MED ORDER — ALTEPLASE 2 MG IJ SOLR
2.0000 mg | Freq: Once | INTRAMUSCULAR | Status: AC
  Administered 2014-05-17: 2 mg
  Filled 2014-05-17: qty 2

## 2014-05-17 MED ORDER — HEPARIN SOD (PORK) LOCK FLUSH 100 UNIT/ML IV SOLN
500.0000 [IU] | INTRAVENOUS | Status: AC | PRN
  Administered 2014-05-17: 500 [IU]

## 2014-05-17 MED ORDER — SODIUM CHLORIDE 0.9 % IV SOLN
INTRAVENOUS | Status: DC
  Administered 2014-05-18 – 2014-05-21 (×2): via INTRAVENOUS

## 2014-05-17 MED ORDER — LEVALBUTEROL HCL 1.25 MG/0.5ML IN NEBU: 1.2500 mg | INHALATION_SOLUTION | RESPIRATORY_TRACT | Status: DC | PRN

## 2014-05-17 MED ORDER — SUCRALFATE 1 GM/10ML PO SUSP: 1.0000 g | Freq: Three times a day (TID) | ORAL | Status: DC

## 2014-05-17 MED ORDER — TAMSULOSIN HCL 0.4 MG PO CAPS: 0.4000 mg | ORAL_CAPSULE | Freq: Every day | ORAL | Status: DC

## 2014-05-17 MED ORDER — PANTOPRAZOLE SODIUM 40 MG PO PACK: 40.0000 mg | PACK | Freq: Two times a day (BID) | ORAL | Status: DC

## 2014-05-17 MED ORDER — APIXABAN 5 MG PO TABS
10.0000 mg | ORAL_TABLET | Freq: Two times a day (BID) | ORAL | Status: DC
  Administered 2014-05-17: 10 mg via ORAL
  Filled 2014-05-17 (×2): qty 2

## 2014-05-17 MED ORDER — ONDANSETRON HCL 4 MG PO TABS: 8.0000 mg | ORAL_TABLET | Freq: Three times a day (TID) | ORAL | Status: DC | PRN

## 2014-05-17 MED ORDER — ZOLPIDEM TARTRATE 5 MG PO TABS: 5.0000 mg | ORAL_TABLET | Freq: Every evening | ORAL | Status: DC | PRN

## 2014-05-17 MED ORDER — APIXABAN 5 MG PO TABS: 5.0000 mg | ORAL_TABLET | Freq: Two times a day (BID) | ORAL | Status: DC

## 2014-05-17 MED ORDER — METOPROLOL TARTRATE 12.5 MG HALF TABLET: 12.5000 mg | ORAL_TABLET | Freq: Two times a day (BID) | ORAL | Status: DC

## 2014-05-17 MED ORDER — AMIODARONE HCL 200 MG PO TABS
400.0000 mg | ORAL_TABLET | Freq: Two times a day (BID) | ORAL | Status: DC
  Administered 2014-05-18 (×2): 400 mg via ORAL
  Filled 2014-05-17 (×3): qty 2

## 2014-05-17 MED ORDER — ZOLPIDEM TARTRATE 5 MG PO TABS
5.0000 mg | ORAL_TABLET | Freq: Every evening | ORAL | Status: DC | PRN
  Administered 2014-05-18 (×2): 5 mg via ORAL
  Filled 2014-05-17 (×2): qty 1

## 2014-05-17 MED ORDER — ATORVASTATIN CALCIUM 80 MG PO TABS
80.0000 mg | ORAL_TABLET | Freq: Every day | ORAL | Status: DC
  Filled 2014-05-17: qty 1

## 2014-05-17 MED ORDER — INSULIN DETEMIR 100 UNIT/ML ~~LOC~~ SOLN: 3.0000 [IU] | Freq: Every day | SUBCUTANEOUS | Status: DC

## 2014-05-17 MED ORDER — SODIUM CHLORIDE 0.9 % IV SOLN
INTRAVENOUS | Status: DC
  Administered 2014-05-17: 22:00:00 via INTRAVENOUS

## 2014-05-17 MED ORDER — DRONABINOL 2.5 MG PO CAPS: 2.5000 mg | ORAL_CAPSULE | Freq: Two times a day (BID) | ORAL | Status: DC

## 2014-05-17 MED ORDER — FLUCONAZOLE 100 MG PO TABS
100.0000 mg | ORAL_TABLET | Freq: Every day | ORAL | Status: DC
Start: 2014-05-17 — End: 2014-05-17

## 2014-05-17 MED ORDER — INSULIN DETEMIR 100 UNIT/ML ~~LOC~~ SOLN
3.0000 [IU] | Freq: Every day | SUBCUTANEOUS | Status: DC
  Administered 2014-05-18: 3 [IU] via SUBCUTANEOUS
  Filled 2014-05-17: qty 0.03

## 2014-05-17 MED ORDER — NYSTATIN 100000 UNIT/ML MT SUSP
5.0000 mL | Freq: Four times a day (QID) | OROMUCOSAL | Status: DC
  Administered 2014-05-17: 500000 [IU] via ORAL
  Filled 2014-05-17 (×4): qty 5

## 2014-05-17 MED ORDER — ENSURE PUDDING PO PUDG: 1.0000 | Freq: Three times a day (TID) | ORAL | Status: DC | PRN

## 2014-05-17 MED ORDER — LACTULOSE 10 GM/15ML PO SOLN
20.0000 g | Freq: Two times a day (BID) | ORAL | Status: DC
  Administered 2014-05-18 – 2014-05-19 (×3): 20 g via ORAL
  Filled 2014-05-17 (×5): qty 30

## 2014-05-17 MED ORDER — SUCRALFATE 1 GM/10ML PO SUSP
1.0000 g | Freq: Three times a day (TID) | ORAL | Status: DC
  Administered 2014-05-18 (×2): 1 g via ORAL
  Filled 2014-05-17 (×5): qty 10

## 2014-05-17 MED ORDER — AMIODARONE HCL 400 MG PO TABS: ORAL_TABLET | ORAL | Status: DC

## 2014-05-17 MED ORDER — DRONABINOL 2.5 MG PO CAPS
2.5000 mg | ORAL_CAPSULE | Freq: Two times a day (BID) | ORAL | Status: DC
  Administered 2014-05-18 (×2): 2.5 mg via ORAL
  Filled 2014-05-17 (×3): qty 1

## 2014-05-17 MED ORDER — INSULIN ASPART 100 UNIT/ML ~~LOC~~ SOLN
0.0000 [IU] | SUBCUTANEOUS | Status: DC
  Administered 2014-05-18: 1 [IU] via SUBCUTANEOUS
  Administered 2014-05-18: 2 [IU] via SUBCUTANEOUS
  Administered 2014-05-18 (×2): 1 [IU] via SUBCUTANEOUS

## 2014-05-17 MED ORDER — ACETAMINOPHEN 325 MG PO TABS
650.0000 mg | ORAL_TABLET | Freq: Once | ORAL | Status: AC
  Administered 2014-05-17: 650 mg via ORAL
  Filled 2014-05-17: qty 2

## 2014-05-17 MED ORDER — PIPERACILLIN-TAZOBACTAM 3.375 G IVPB 30 MIN
3.3750 g | Freq: Once | INTRAVENOUS | Status: AC
  Administered 2014-05-17: 3.375 g via INTRAVENOUS
  Filled 2014-05-17: qty 50

## 2014-05-17 MED ORDER — MOMETASONE FURO-FORMOTEROL FUM 200-5 MCG/ACT IN AERO
2.0000 | INHALATION_SPRAY | Freq: Two times a day (BID) | RESPIRATORY_TRACT | Status: DC
  Administered 2014-05-18: 2 via RESPIRATORY_TRACT
  Filled 2014-05-17: qty 8.8

## 2014-05-17 MED ORDER — NYSTATIN 100000 UNIT/ML MT SUSP: 5.0000 mL | Freq: Four times a day (QID) | OROMUCOSAL | Status: DC

## 2014-05-17 MED ORDER — PANTOPRAZOLE SODIUM 40 MG PO PACK
40.0000 mg | PACK | Freq: Two times a day (BID) | ORAL | Status: DC
  Administered 2014-05-18 (×2): 40 mg via ORAL
  Filled 2014-05-17 (×3): qty 20

## 2014-05-17 MED ORDER — LEVALBUTEROL HCL 1.25 MG/0.5ML IN NEBU
1.2500 mg | INHALATION_SOLUTION | RESPIRATORY_TRACT | Status: DC | PRN
  Filled 2014-05-17: qty 0.5

## 2014-05-17 MED ORDER — VANCOMYCIN HCL IN DEXTROSE 1-5 GM/200ML-% IV SOLN
1000.0000 mg | Freq: Once | INTRAVENOUS | Status: AC
Start: 2014-05-17 — End: 2014-05-18
  Administered 2014-05-18: 1000 mg via INTRAVENOUS
  Filled 2014-05-17: qty 200

## 2014-05-17 MED ORDER — IOHEXOL 300 MG/ML  SOLN: 50.0000 mL | Freq: Once | INTRAMUSCULAR | Status: AC | PRN

## 2014-05-17 NOTE — Progress Notes (Signed)
[facility]  [facility] is providing the following services: Nebulizer and Commode  If patient discharges after hours, please call [phone].   Linward Headland 05/17/2014, 2:19 PM

## 2014-05-17 NOTE — ED Notes (Signed)
Called floor to make aware of pt transport up to room. RN unable to take pt at this time asked for a couple of extra minutes before pt transport.

## 2014-05-17 NOTE — Progress Notes (Signed)
Hopefully, Gregory Farrell will home today. His appetite was not as good yesterday. There is no nausea or vomiting. He does not have any odynophagia. He has had no bleeding. He's on ELIQUIS.  He said that he did walk a little bit yesterday.  His atrial fibrillation seems to be under good control.  He has had bowel movements.  He's had no complaints of shortness of breath.  His labs look okay area and hemoglobin is 10.5. White cell count is 4.6. His creatinine is. 1.64.  He continues on Marinol to try to help with his appetite.  On his physical exam, his blood pressure is 97/56. His temperature is 98.6. Heart rate is 70. His lungs sound clear. He does have some thrush. I will put him on some Diflucan.cardiac exam is regular rate and rhythm. Abdomen is soft. Bowel sounds are slightly decreased. Extremity shows some slight improvement in the right upper extremity pitting edema.skin exam shows some scattered ecchymoses. Neurological exam is nonfocal.  Again, I think that his daughter will be ready for him to go home today. She needs to be called to make sure that he will be ready to go home.  If he does go home, I will see him back in the office in a couple weeks.  I think, to make it simple her for him, we can just have Mann the ELIQUIS dose of 5 mg twice a day. I think will be confusing for he and his family if there are 2 different ELIQUIS doses. Gregory Farrell.  Gregory Farrell 1:9

## 2014-05-17 NOTE — ED Provider Notes (Signed)
CSN: [id number]     Arrival date & time 05/17/14  2039 History   First MD Initiated Contact with Patient 05/17/14 2105     Chief Complaint  Patient presents with  . Weakness  . Hematuria     (Consider location/radiation/quality/duration/timing/severity/associated sxs/prior Treatment) HPI Comments: Patient here complaining of fever which began when he arrived home from the hospital today. He was home for approximately 2 hours. He was just discharged after 14 day stay. He noted chills along with a nosebleed as well as hematuria. He was diagnosed with a right upper history DVT and is taking anti-coagulants for that. Denies any cough or congestion.  No dysuria. No flank pain. No abdominal pain. Did not take any antipyretics. Assistant and nothing makes them better or worse.  Patient is a 80 y.o. male presenting with weakness and hematuria. The history is provided by the patient and a relative.  Weakness  Hematuria    Past Medical History  Diagnosis Date  . Hypertension   . COPD (chronic obstructive pulmonary disease)   . GERD (gastroesophageal reflux disease)   . Renal insufficiency   . Hyperlipidemia   . CAD (coronary artery disease)   . B12 deficiency   . Memory loss   . Vertigo   . Diabetes mellitus     type II, poly neuropathy  . Overweight 01/31/2013  . Lung cancer, hilus 03/22/2014   Past Surgical History  Procedure Laterality Date  . Coronary angioplasty with stent placement      more than 10 years ago  . Inguinal hernia repair  2010  . Video bronchoscopy Bilateral 03/07/2014    Procedure: VIDEO BRONCHOSCOPY WITHOUT FLUORO;  Surgeon: Kathee Delton, MD;  Location: [facility] ENDOSCOPY;  Service: Cardiopulmonary;  Laterality: Bilateral;  . Esophagogastroduodenoscopy N/A 05/12/2014    Procedure: ESOPHAGOGASTRODUODENOSCOPY (EGD);  Surgeon: Arta Silence, MD;  Location: [facility];  Service: Endoscopy;  Laterality: N/A;   Family History  Problem Relation Age of Onset  . Coronary  artery disease Mother   . Breast cancer Mother   . Diabetes Mother   . Leukemia Brother    History  Substance Use Topics  . Smoking status: Former Smoker -- 1.00 packs/day for 20 years    Types: Cigarettes    Start date: 09/09/1982    Quit date: 07/15/2002  . Smokeless tobacco: Never Used     Comment: quit smoking 11 years ago  . Alcohol Use: No    Review of Systems  Genitourinary: Positive for hematuria.  Neurological: Positive for weakness.  All other systems reviewed and are negative.     Allergies  Exenatide  Home Medications   Prior to Admission medications   Medication Sig Start Date End Date Taking? Authorizing Provider  amiodarone (PACERONE) 400 MG tablet Take 400 mg Twice a day for 7 days, then 400 mg daily for 2 weeks, then 200 mg daily. 05/17/14   Belkys A Regalado, MD  apixaban (ELIQUIS) 5 MG TABS tablet Take 1 tablet (5 mg total) by mouth 2 (two) times daily. 05/20/14   Belkys A Regalado, MD  atorvastatin (LIPITOR) 80 MG tablet TAKE ONE TABLET BY MOUTH ONE TIME DAILY  04/08/14   Mosie Lukes, MD  budesonide-formoterol [facility]) 160-4.5 MCG/ACT inhaler Inhale 2 puffs into the lungs 2 (two) times daily.    Historical Provider, MD  dronabinol (MARINOL) 2.5 MG capsule Take 1 capsule (2.5 mg total) by mouth 2 (two) times daily before lunch and supper. 05/17/14   Belkys A  Regalado, MD  feeding supplement, ENSURE, (ENSURE) PUDG Take 1 Container by mouth 3 (three) times daily between meals as needed (Per pt request). 05/17/14   Belkys A Regalado, MD  insulin detemir (LEVEMIR) 100 UNIT/ML injection Inject 0.03 mLs (3 Units total) into the skin daily. May increase by 2 units every 3 days as directed 05/17/14   Belkys A Regalado, MD  lactulose (CHRONULAC) 10 GM/15ML solution Take 30 mLs (20 g total) by mouth 2 (two) times daily. 04/28/14   Volanda Napoleon, MD  levalbuterol Penne Lash) 1.25 MG/0.5ML nebulizer solution Take 1.25 mg by nebulization every 2 (two) hours as needed for  wheezing or shortness of breath. 05/17/14   Belkys A Regalado, MD  metoprolol tartrate (LOPRESSOR) 12.5 mg TABS tablet Take 0.5 tablets (12.5 mg total) by mouth 2 (two) times daily. 05/17/14   Belkys A Regalado, MD  mometasone-formoterol (DULERA) 200-5 MCG/ACT AERO Inhale 2 puffs into the lungs 2 (two) times daily.    Historical Provider, MD  nystatin (MYCOSTATIN) 100000 UNIT/ML suspension Take 5 mLs (500,000 Units total) by mouth 4 (four) times daily. 05/17/14   Belkys A Regalado, MD  ondansetron (ZOFRAN) 8 MG tablet Take 8 mg by mouth every 8 (eight) hours as needed for nausea or vomiting. Start after Chemo    Historical Provider, MD  pantoprazole sodium (PROTONIX) 40 mg/20 mL PACK Take 20 mLs (40 mg total) by mouth 2 (two) times daily. 05/17/14   Belkys A Regalado, MD  prochlorperazine (COMPAZINE) 10 MG tablet Take 10 mg by mouth every 6 (six) hours as needed for nausea or vomiting.    Historical Provider, MD  sucralfate (CARAFATE) 1 GM/10ML suspension Take 10 mLs (1 g total) by mouth 4 (four) times daily -  with meals and at bedtime. 05/17/14   Belkys A Regalado, MD  tamsulosin (FLOMAX) 0.4 MG CAPS capsule Take 1 capsule (0.4 mg total) by mouth daily after supper. 05/17/14   Belkys A Regalado, MD  Wound Cleansers (RADIAPLEX EX) Apply topically.    Historical Provider, MD  zolpidem (AMBIEN) 5 MG tablet Take 1 tablet (5 mg total) by mouth at bedtime as needed for sleep. 05/17/14   Belkys A Regalado, MD   BP 124/61 mmHg  Pulse 98  Temp(Src) 103.2 F (39.6 C) (Rectal)  Resp 26  SpO2 92% Physical Exam  Constitutional: He is oriented to person, place, and time. He appears well-developed and well-nourished.  Non-toxic appearance. No distress.  HENT:  Head: Normocephalic and atraumatic.  Eyes: Conjunctivae, EOM and lids are normal. Pupils are equal, round, and reactive to light.  Neck: Normal range of motion. Neck supple. No tracheal deviation present. No thyroid mass present.  Cardiovascular: Normal  rate, regular rhythm and normal heart sounds.  Exam reveals no gallop.   No murmur heard. Pulmonary/Chest: Effort normal and breath sounds normal. No stridor. No respiratory distress. He has no decreased breath sounds. He has no wheezes. He has no rhonchi. He has no rales.  Abdominal: Soft. Normal appearance and bowel sounds are normal. He exhibits no distension. There is no tenderness. There is no rebound and no CVA tenderness.  Musculoskeletal: Normal range of motion. He exhibits no edema or tenderness.       Arms: Neurological: He is alert and oriented to person, place, and time. He has normal strength. No cranial nerve deficit or sensory deficit. GCS eye subscore is 4. GCS verbal subscore is 5. GCS motor subscore is 6.  Skin: Skin is warm and dry. No  abrasion and no rash noted.  Psychiatric: He has a normal mood and affect. His speech is normal and behavior is normal.  Nursing note and vitals reviewed.   ED Course  Procedures (including critical care time) Labs Review Labs Reviewed  CULTURE, BLOOD (ROUTINE X 2)  CULTURE, BLOOD (ROUTINE X 2)  CBC WITH DIFFERENTIAL  COMPREHENSIVE METABOLIC PANEL  URINALYSIS, ROUTINE W REFLEX MICROSCOPIC  I-STAT CG4 LACTIC ACID, ED    Imaging Review No results found.   EKG Interpretation None      MDM   Final diagnoses:  Fever    Blood cultures obtained here. Lactate normal. He was medicated with Tylenol for his temperature. Discussed the case with the hospitalist and patient will be empirically started on antibiotics and admitted to the medicine service.    Leota Jacobsen, MD 05/17/14 [phone]

## 2014-05-17 NOTE — Progress Notes (Signed)
SUBJECTIVE:  Following atrial fib. Pt. Resting comfortably this AM.   Filed Vitals:   05/16/14 1300 05/16/14 2003 05/16/14 2039 05/17/14 0542  BP: 126/56 93/65 100/52 97/56  Pulse: 82 76 72 71  Temp: 100.2 F (37.9 C) 99.9 F (37.7 C)  98.6 F (37 C)  TempSrc: Oral Oral  Oral  Resp: 18 20    Height:      Weight:    189 lb 6 oz (85.9 kg)  SpO2: 94% 95%  99%     Intake/Output Summary (Last 24 hours) at 05/17/14 0718 Last data filed at 05/17/14 0543  Gross per 24 hour  Intake      0 ml  Output    180 ml  Net   -180 ml    LABS: Basic Metabolic Panel:  Recent Labs  05/15/14 0455 05/16/14 0455  NA 143 139  K 4.0 3.9  CL 106 101  CO2 28 27  GLUCOSE 142* 164*  BUN 6 12  CREATININE 1.53* 1.62*  CALCIUM 8.1* 8.0*   Liver Function Tests: No results for input(s): AST, ALT, ALKPHOS, BILITOT, PROT, ALBUMIN in the last 72 hours. No results for input(s): LIPASE, AMYLASE in the last 72 hours. CBC:  Recent Labs  05/16/14 0455 05/17/14 0635  WBC 4.8 4.6  NEUTROABS 2.9 2.7  HGB 10.3* 10.5*  HCT 32.7* 32.4*  MCV 97.6 97.3  PLT 99* 74*   Cardiac Enzymes: No results for input(s): CKTOTAL, CKMB, CKMBINDEX, TROPONINI in the last 72 hours. BNP: Invalid input(s): POCBNP D-Dimer: No results for input(s): DDIMER in the last 72 hours. Hemoglobin A1C: No results for input(s): HGBA1C in the last 72 hours. Fasting Lipid Panel: No results for input(s): CHOL, HDL, LDLCALC, TRIG, CHOLHDL, LDLDIRECT in the last 72 hours. Thyroid Function Tests: No results for input(s): TSH, T4TOTAL, T3FREE, THYROIDAB in the last 72 hours.  Invalid input(s): FREET3  RADIOLOGY: Ct Angio Chest Pe W/cm &/or Wo Cm  05/07/2014   CLINICAL DATA:  History of stage IIIB squamous cell lung cancer, undergoing chemotherapy and radiation. Generalized weakness and lightheadedness. Shortness of breath, tachycardia. Evaluate for pulmonary embolus.  EXAM: CT ANGIOGRAPHY CHEST WITH CONTRAST  TECHNIQUE:  Multidetector CT imaging of the chest was performed using the standard protocol during bolus administration of intravenous contrast. Multiplanar CT image reconstructions and MIPs were obtained to evaluate the vascular anatomy.  CONTRAST:  57mL OMNIPAQUE IOHEXOL 350 MG/ML SOLN  COMPARISON:  02/23/2014  FINDINGS: No filling defects in the pulmonary arteries to suggest pulmonary emboli. Stable moderate-sized hiatal hernia. Heart is normal size. Aorta is normal caliber. Densely calcified coronary arteries. Moderate calcifications throughout the aorta which is mildly tortuous.  No mediastinal, hilar, or axillary adenopathy. Stable small left hilar lymph node measuring 9 mm in short axis diameter on image 43. Chest wall soft tissues are unremarkable.  Significant improvement in left lower lobe atelectasis/collapse since prior study. The central left hilar/infrahilar mass much improved and difficult to measure. This soft tissue surrounds the left lower lobe bronchus on image 50, but again is improved.  Right basilar atelectasis. Small subpleural nodule in the left upper lobe measures 3 mm on image 41, stable. No new or enlarging pulmonary nodules. Trace bilateral pleural effusions. No pericardial effusion.  Imaging into the upper abdomen shows no acute findings.  No acute bony abnormality or focal bone lesion. Changes of ankylosing spondylitis again noted within the thoracic spine.  Review of the MIP images confirms the above findings.  IMPRESSION: Improving  left hilar/ infrahilar obstructing mass. Mild abnormal soft tissue continues does surround the left lower lobe bronchus, but there has improved aeration with decreasing atelectasis in the left lower lung. Stable small/borderline size left hilar lymph node.  Stable posterior subpleural nodule in the left upper lobe, 3 mm.  Right basilar atelectasis.  Severe coronary artery disease.  Stable hiatal hernia.   Electronically Signed   By: Rolm Baptise M.D.   On: 05/07/2014  23:48   Dg Chest Port 1 View  05/07/2014   CLINICAL DATA:  Patient complaining of shortness of breath today. History of COPD. History of lung carcinoma, coronary artery disease and hypertension. Ex-smoker.  EXAM: PORTABLE CHEST - 1 VIEW  COMPARISON:  10/07/2012.  FINDINGS: Cardiac silhouette is mildly enlarged. No mediastinal or hilar masses.  No lung consolidation or edema.  No mass or discrete nodule.  Opacity superimpose are cardiac silhouette may be due to the aorta or a small hiatal hernia.  No convincing pleural effusion.  No pneumothorax.  Right anterior chest wall power Port-A-Cath has its tip in the lower superior vena cava.  IMPRESSION: No acute cardiopulmonary disease.   Electronically Signed   By: Lajean Manes M.D.   On: 05/07/2014 11:11    PHYSICAL EXAM   Limited exam. Patient resting.   TELEMETRY:  I reviewed today. There is NSR. There is a 12 beat run of atrial fib. Rate 150.   ASSESSMENT AND PLAN:     Diabetes mellitus, type 2   Coronary atherosclerosis   Lung cancer, hilus   Generalized weakness   Orthostatic hypotension   Protein-calorie malnutrition, severe    Paroxysmal atrial fibrillation   On amiodarone therapy       Plan to continue amiodarone.  Daryel November, MD   Dola Argyle 05/17/2014 7:18 AM

## 2014-05-17 NOTE — ED Notes (Addendum)
Per EMS pt comes from home and was just released from [facility] this afternoon. Pt c/o weakness and hematuria. Pt foley catheter was just removed today. Pt denies n/v and pain. Pt reports burning with urination. Pt is alert and oriented. Pt ambulatory with a lot of assistance.

## 2014-05-17 NOTE — Progress Notes (Signed)
CSW reviewed PT evaluation recommending SNF at discharge now, CSW provided patient's daughter with SNF bed offers. Daughter was not pleased with bed offers provided & has decided to take patient home with home health.   Clinical Social Work Department CLINICAL SOCIAL WORK PLACEMENT NOTE 05/17/2014  Patient:  Gregory Farrell, Gregory Farrell  Account Number:  [id number] Admit date:  05/07/2014  Clinical Social Worker:  Renold Genta  Date/time:  05/17/2014 10:56 AM  Clinical Social Work is seeking post-discharge placement for this patient at the following level of care:   SKILLED NURSING   (*CSW will update this form in Epic as items are completed)   05/17/2014  Patient/family provided with [place] Department of Clinical Social Work's list of facilities offering this level of care within the geographic area requested by the patient (or if unable, by the patient's family).  05/17/2014  Patient/family informed of their freedom to choose among providers that offer the needed level of care, that participate in Medicare, Medicaid or managed care program needed by the patient, have an available bed and are willing to accept the patient.  05/17/2014  Patient/family informed of [facility]' ownership interest in [facility], as well as of the fact that they are under no obligation to receive care at this facility.  PASARR submitted to EDS on 05/17/2014 PASARR number received on 05/17/2014  FL2 transmitted to all facilities in geographic area requested by pt/family on  05/17/2014 FL2 transmitted to all facilities within larger geographic area on   Patient informed that his/her managed care company has contracts with or will negotiate with  certain facilities, including the following:   [facility]     Patient/family informed of bed offers received:   Patient chooses bed at  Physician recommends and patient chooses bed at    Patient to be transferred to  on   Patient to be transferred to  facility by  Patient and family notified of transfer on  Name of family member notified:    The following physician request were entered in Epic:   Additional Comments:   No further CSW needs identified - CSW signing off.   Raynaldo Opitz, [facility] Clinical Social Worker cell #: [phone]

## 2014-05-17 NOTE — Discharge Summary (Signed)
Physician Discharge Summary  Gregory Farrell WUJ:[id number] DOB: 1932/12/28 DOA: 05/07/2014  PCP: [name], MD  Admit date: 05/07/2014 Discharge date: 05/17/2014  Time spent: 35 minutes  Recommendations for Outpatient Follow-up:  1. Please repeat renal function, patient on Eliquis. 2. Please Repeat CBC, to follow platelet level.  3. Please follow urine culture.   Discharge Diagnoses:    Radiation Esophagitis.    Right Upper Ext DVT.     Acute on CKD   A fib RVR   Diabetes mellitus, type 2   Coronary atherosclerosis   Lung cancer, hilus   Dehydration   Acute respiratory failure with hypoxia   Elevated d-dimer   Generalized weakness   Orthostatic hypotension   Protein-calorie malnutrition, severe   Paroxysmal atrial fibrillation   On amiodarone therapy   Discharge Condition: Stable.   Diet recommendation: Regular diet.   Filed Weights   05/14/14 0641 05/16/14 0405 05/17/14 0542  Weight: 86.2 kg (190 lb 0.6 oz) 88.6 kg (195 lb 5.2 oz) 85.9 kg (189 lb 6 oz)    History of present illness:  The patient is a 80 y.o. year-old male with history of stage IIIB squamous cell lung cancer for which he has been undergoing chemotherapy and XRT, COPD, GERD, HTN/HLD, CAD s/p stent 10 years ago, T2DM who presents with generalized weakness and lightheadedness. The patient was last at their baseline health several weeks ago. Because he had been having severe abdominal pain, he was given a break from his chemotherapy for 1 week which helped. He resumes his chemotherapy last week. The patient and his family report that the chemotherapy causes nausea and decreased appetite. Although he has been trying to drink more fluids, he is only been able to keep down approximately 4-6 ounces of fluid per day for the last several days. He has been compliant with his antiemetics. He has had some progressive weakness over the last few weeks, but this morning around 2 AM he was so weak he was unable to stand up.  When he sits up or stands up, he becomes very lightheaded or dizzy. He denies recent fevers, chills, new signs of infection. He has chronic rhinorrhea, chronic cough. He denies any increased shortness of breath, dysuria. He is constipated. He states approximately 2 weeks ago, he had a change in the hearing of his right ear. He can hear his heartbeat in his right ear and he has noticed that his heartbeat is irregular intermittently. He denies any chest pain, chest pressure, association with shortness of breath, nausea, lightheadedness. Since this morning, he has felt that his speech has been slurred, his thinking has been clouded. He is worried he had a stroke. Denies focal numbness, weakness.  Hospital Course:  Slurred speech with confusion and blurred vision, now resolved but concerning for possible stroke - failed initial swallow but passed speech therapy's swallow eval on 10/25 - holding Atorvastatin until able to swallow.  - Continue diabetes management - Unable to do MRI. Daughter does not want to proceed with CT head, and or MRI.  -Continue with Eliquis.    Encephalopathy; resolved likely related to ativan.  Patient is very sensitive to ativan. . Sedatives.  Please avoid sedatives.   Insomnia: Patient very sensitive to sedatives. Would avoid ativan. Family and patient ok to try ambien/ patient refuse to continue taking remeron.   DVT right subclavian vein and axillary vein:  Continue with eliquis.  Patient has small nose bleed. That resolved.   Skin burn post radiation;  Wound care consulted. Local care.   A-fib with RVR 10/25 that responded to amiodarone infusion. Normal systolic function and no major valvular dysfunction on ECHO.  - Cycle troponins: neg - TSH 1.36 - amio and metoprolol per cardiology - on eliquis - Appreciate cardiology assistance  Dysphagia; post radiation esophagitis.  - Change medications to liquid where possible - GI consult placed by Dr.  Marin Olp for endoscopy to rule out stenosis and underlying infectious esophagitis.  -Endoscopy show esophagitis.  -Carafate , PPI.  -Tolerating diet.   Acute hypoxic respiratory failure, now resolved. May have been due to COPD exac. CT angio chest: Improving left hilar and infrahilar mass. Persistent mild abnormal soft tissue surrounding the left lower bronchus with improved aeration. No evidence of PE.  - Continue dulera and prn xopenex  Lower extermity edema may be due to diastolic heart failure or DVT in the setting of malignancy  - Lower extremity duplex: Negative for DVT - ECHO as above  Mild Acute on chronic kidney disease stage 3, likely prerenal given history and resolved with IVF Reviewing records patient cr trend 1.5 to 1.6 a month ago.  Cr stable.   Acute urinary retention - Started flomax - Bladder scan PRN.   Constipation, stable, continue lactulose  CAD, chest pain free  - Continue ASA, BB, statin per cardiology  T2DM, CBG now somewhat low today and not eating - A1c 7.6 -discharge on low dose levemir. 4 units.   Squamous cell lung CA  - Appreciate oncology assistance   Pancytopenia due to recent chemotherapy -Resolved.  -Received Neupogen.   Procedures:  CXR  CT angio chest  Duplex lower extremities  ECHO  MRI/MRA   Consultations:  Ennever  Cardiology  GI  Discharge Exam: Filed Vitals:   05/17/14 0542  BP: 97/56  Pulse: 71  Temp: 98.6 F (37 C)  Resp:     General: Alert in no distress.  Cardiovascular: S 1, S 2 RRR Respiratory: CTA  Discharge Instructions You were cared for by a hospitalist during your hospital stay. If you have any questions about your discharge medications or the care you received while you were in the hospital after you are discharged, you can call the unit and asked to speak with the hospitalist on call if the hospitalist that took care of you is not available. Once you are discharged, your  primary care physician will handle any further medical issues. Please note that NO REFILLS for any discharge medications will be authorized once you are discharged, as it is imperative that you return to your primary care physician (or establish a relationship with a primary care physician if you do not have one) for your aftercare needs so that they can reassess your need for medications and monitor your lab values.  Discharge Instructions    Diet Carb Modified    Complete by:  As directed      Increase activity slowly    Complete by:  As directed           Current Discharge Medication List    START taking these medications   Details  amiodarone (PACERONE) 400 MG tablet Take 400 mg Twice a day for 7 days, then 400 mg daily for 2 weeks, then 200 mg daily. Qty: 60 tablet, Refills: 0    apixaban (ELIQUIS) 5 MG TABS tablet Take 1 tablet (5 mg total) by mouth 2 (two) times daily. Qty: 60 tablet, Refills: 0   Associated Diagnoses: Persistent atrial fibrillation  dronabinol (MARINOL) 2.5 MG capsule Take 1 capsule (2.5 mg total) by mouth 2 (two) times daily before lunch and supper. Qty: 30 capsule, Refills: 0   Associated Diagnoses: Protein-calorie malnutrition, severe    feeding supplement, ENSURE, (ENSURE) PUDG Take 1 Container by mouth 3 (three) times daily between meals as needed (Per pt request). Qty: 30 Can, Refills: 0    levalbuterol (XOPENEX) 1.25 MG/0.5ML nebulizer solution Take 1.25 mg by nebulization every 2 (two) hours as needed for wheezing or shortness of breath. Qty: 1 each, Refills: 12    metoprolol tartrate (LOPRESSOR) 12.5 mg TABS tablet Take 0.5 tablets (12.5 mg total) by mouth 2 (two) times daily. Qty: 60 tablet, Refills: 0    nystatin (MYCOSTATIN) 100000 UNIT/ML suspension Take 5 mLs (500,000 Units total) by mouth 4 (four) times daily. Qty: 60 mL, Refills: 0    pantoprazole sodium (PROTONIX) 40 mg/20 mL PACK Take 20 mLs (40 mg total) by mouth 2 (two) times  daily. Qty: 60 each, Refills: 0   Associated Diagnoses: Lung cancer; Dizziness; Dehydration; Cardiac arrhythmia, unspecified cardiac arrhythmia type; Slurred speech; Dyspnea; Tachycardia; Cancer; Acute respiratory failure with hypoxia; Generalized weakness; Atherosclerosis of native coronary artery of native heart without angina pectoris; Paroxysmal atrial fibrillation; Malignant neoplasm of hilus of right lung; Type 2 diabetes mellitus with moderate nonproliferative diabetic retinopathy and without macular edema; Elevated d-dimer; Orthostatic hypotension; Protein-calorie malnutrition, severe; Gastroesophageal reflux disease without esophagitis; Memory loss; Shortness of breath; Adjustment disorder with depressed mood; Claudication; Lung nodules; Overweight; Hip pain, right; Solitary pulmonary nodule; Lung mass; Low testosterone    tamsulosin (FLOMAX) 0.4 MG CAPS capsule Take 1 capsule (0.4 mg total) by mouth daily after supper. Qty: 30 capsule, Refills: 0    zolpidem (AMBIEN) 5 MG tablet Take 1 tablet (5 mg total) by mouth at bedtime as needed for sleep. Qty: 30 tablet, Refills: 0      CONTINUE these medications which have CHANGED   Details  insulin detemir (LEVEMIR) 100 UNIT/ML injection Inject 0.03 mLs (3 Units total) into the skin daily. May increase by 2 units every 3 days as directed Qty: 10 mL, Refills: 11    sucralfate (CARAFATE) 1 GM/10ML suspension Take 10 mLs (1 g total) by mouth 4 (four) times daily -  with meals and at bedtime. Qty: 420 mL, Refills: 0      CONTINUE these medications which have NOT CHANGED   Details  budesonide-formoterol (SYMBICORT) 160-4.5 MCG/ACT inhaler Inhale 2 puffs into the lungs 2 (two) times daily.    lactulose (CHRONULAC) 10 GM/15ML solution Take 30 mLs (20 g total) by mouth 2 (two) times daily. Qty: 500 mL, Refills: 0   Associated Diagnoses: Malignant neoplasm of hilus of left lung    mometasone-formoterol (DULERA) 200-5 MCG/ACT AERO Inhale 2 puffs  into the lungs 2 (two) times daily.    ondansetron (ZOFRAN) 8 MG tablet Take 8 mg by mouth every 8 (eight) hours as needed for nausea or vomiting. Start after Chemo    atorvastatin (LIPITOR) 80 MG tablet TAKE ONE TABLET BY MOUTH ONE TIME DAILY  Qty: 90 tablet, Refills: 1    prochlorperazine (COMPAZINE) 10 MG tablet Take 10 mg by mouth every 6 (six) hours as needed for nausea or vomiting.    Wound Cleansers (RADIAPLEX EX) Apply topically.      STOP taking these medications     albuterol (PROVENTIL HFA;VENTOLIN HFA) 108 (90 BASE) MCG/ACT inhaler      fentaNYL (DURAGESIC - DOSED MCG/HR) 12 MCG/HR  loperamide (IMODIUM) 1 MG/5ML solution      pantoprazole (PROTONIX) 40 MG tablet      citalopram (CELEXA) 20 MG tablet        Allergies  Allergen Reactions  . Exenatide     REACTION: nausea   Follow-up Information    Follow up with [name], MD.   Specialty:  Family Medicine   Contact information:   [address] [place] [address] [phone]       Follow up with Volanda Napoleon, MD In 1 week.   Specialty:  Oncology   Contact information:   [address] [place], [address] [place] [address] [phone]        The results of significant diagnostics from this hospitalization (including imaging, microbiology, ancillary and laboratory) are listed below for reference.    Significant Diagnostic Studies: Ct Angio Chest Pe W/cm &/or Wo Cm  05/07/2014   CLINICAL DATA:  History of stage IIIB squamous cell lung cancer, undergoing chemotherapy and radiation. Generalized weakness and lightheadedness. Shortness of breath, tachycardia. Evaluate for pulmonary embolus.  EXAM: CT ANGIOGRAPHY CHEST WITH CONTRAST  TECHNIQUE: Multidetector CT imaging of the chest was performed using the standard protocol during bolus administration of intravenous contrast. Multiplanar CT image reconstructions and MIPs were obtained to evaluate the vascular anatomy.  CONTRAST:   14mL OMNIPAQUE IOHEXOL 350 MG/ML SOLN  COMPARISON:  02/23/2014  FINDINGS: No filling defects in the pulmonary arteries to suggest pulmonary emboli. Stable moderate-sized hiatal hernia. Heart is normal size. Aorta is normal caliber. Densely calcified coronary arteries. Moderate calcifications throughout the aorta which is mildly tortuous.  No mediastinal, hilar, or axillary adenopathy. Stable small left hilar lymph node measuring 9 mm in short axis diameter on image 43. Chest wall soft tissues are unremarkable.  Significant improvement in left lower lobe atelectasis/collapse since prior study. The central left hilar/infrahilar mass much improved and difficult to measure. This soft tissue surrounds the left lower lobe bronchus on image 50, but again is improved.  Right basilar atelectasis. Small subpleural nodule in the left upper lobe measures 3 mm on image 41, stable. No new or enlarging pulmonary nodules. Trace bilateral pleural effusions. No pericardial effusion.  Imaging into the upper abdomen shows no acute findings.  No acute bony abnormality or focal bone lesion. Changes of ankylosing spondylitis again noted within the thoracic spine.  Review of the MIP images confirms the above findings.  IMPRESSION: Improving left hilar/ infrahilar obstructing mass. Mild abnormal soft tissue continues does surround the left lower lobe bronchus, but there has improved aeration with decreasing atelectasis in the left lower lung. Stable small/borderline size left hilar lymph node.  Stable posterior subpleural nodule in the left upper lobe, 3 mm.  Right basilar atelectasis.  Severe coronary artery disease.  Stable hiatal hernia.   Electronically Signed   By: Rolm Baptise M.D.   On: 05/07/2014 23:48   Dg Chest Port 1 View  05/07/2014   CLINICAL DATA:  Patient complaining of shortness of breath today. History of COPD. History of lung carcinoma, coronary artery disease and hypertension. Ex-smoker.  EXAM: PORTABLE CHEST - 1 VIEW   COMPARISON:  10/07/2012.  FINDINGS: Cardiac silhouette is mildly enlarged. No mediastinal or hilar masses.  No lung consolidation or edema.  No mass or discrete nodule.  Opacity superimpose are cardiac silhouette may be due to the aorta or a small hiatal hernia.  No convincing pleural effusion.  No pneumothorax.  Right anterior chest wall power  Port-A-Cath has its tip in the lower superior vena cava.  IMPRESSION: No acute cardiopulmonary disease.   Electronically Signed   By: Lajean Manes M.D.   On: 05/07/2014 11:11    Microbiology: No results found for this or any previous visit (from the past 240 hour(s)).   Labs: Basic Metabolic Panel:  Recent Labs Lab 05/13/14 0546 05/14/14 0435 05/15/14 0455 05/16/14 0455 05/17/14 0635  NA 138 139 143 139 138  K 4.1 3.6* 4.0 3.9 4.0  CL 104 104 106 101 101  CO2 24 25 28 27 26   GLUCOSE 121* 161* 142* 164* 129*  BUN 6 5* 6 12 16   CREATININE 1.33 1.43* 1.53* 1.62* 1.64*  CALCIUM 7.9* 8.2* 8.1* 8.0* 8.1*   Liver Function Tests: No results for input(s): AST, ALT, ALKPHOS, BILITOT, PROT, ALBUMIN in the last 168 hours. No results for input(s): LIPASE, AMYLASE in the last 168 hours. No results for input(s): AMMONIA in the last 168 hours. CBC:  Recent Labs Lab 05/13/14 0546 05/14/14 0435 05/15/14 0455 05/16/14 0455 05/17/14 0635  WBC 5.9 5.2 5.3 4.8 4.6  NEUTROABS 4.7 4.1 3.7 2.9 2.7  HGB 9.0* 8.8* 10.8* 10.3* 10.5*  HCT 27.8* 27.4* 33.3* 32.7* 32.4*  MCV 94.6 96.5 95.1 97.6 97.3  PLT 132* 123* 123* 99* 74*   Cardiac Enzymes: No results for input(s): CKTOTAL, CKMB, CKMBINDEX, TROPONINI in the last 168 hours. BNP: BNP (last 3 results) No results for input(s): PROBNP in the last 8760 hours. CBG:  Recent Labs Lab 05/16/14 1212 05/16/14 1708 05/16/14 1959 05/17/14 0049 05/17/14 0756  GLUCAP 199* 176* 194* 144* 145*       Signed:  Hermenegildo Clausen A  [facility] 05/17/2014, 11:06 AM
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
## 2014-05-17 NOTE — ED Notes (Signed)
Bed: KQ20 Expected date: 05/17/14 Expected time: 8:13 PM Means of arrival: Ambulance Comments: 80 yo F  hematuria

## 2014-05-17 NOTE — Plan of Care (Signed)
Problem: Discharge Progression Outcomes Goal: Pain controlled with appropriate interventions Outcome: Completed/Met Date Met:  05/17/14

## 2014-05-18 ENCOUNTER — Ambulatory Visit: Payer: Medicare HMO

## 2014-05-18 DIAGNOSIS — E46 Unspecified protein-calorie malnutrition: Secondary | ICD-10-CM

## 2014-05-18 DIAGNOSIS — N189 Chronic kidney disease, unspecified: Secondary | ICD-10-CM

## 2014-05-18 DIAGNOSIS — I481 Persistent atrial fibrillation: Secondary | ICD-10-CM

## 2014-05-18 DIAGNOSIS — R319 Hematuria, unspecified: Secondary | ICD-10-CM

## 2014-05-18 DIAGNOSIS — I82499 Acute embolism and thrombosis of other specified deep vein of unspecified lower extremity: Secondary | ICD-10-CM

## 2014-05-18 DIAGNOSIS — N183 Chronic kidney disease, stage 3 (moderate): Secondary | ICD-10-CM

## 2014-05-18 DIAGNOSIS — I4891 Unspecified atrial fibrillation: Secondary | ICD-10-CM

## 2014-05-18 DIAGNOSIS — D696 Thrombocytopenia, unspecified: Secondary | ICD-10-CM

## 2014-05-18 DIAGNOSIS — N179 Acute kidney failure, unspecified: Secondary | ICD-10-CM

## 2014-05-18 DIAGNOSIS — A419 Sepsis, unspecified organism: Principal | ICD-10-CM

## 2014-05-18 DIAGNOSIS — R509 Fever, unspecified: Secondary | ICD-10-CM | POA: Insufficient documentation

## 2014-05-18 DIAGNOSIS — R531 Weakness: Secondary | ICD-10-CM

## 2014-05-18 DIAGNOSIS — C3402 Malignant neoplasm of left main bronchus: Secondary | ICD-10-CM

## 2014-05-18 LAB — URINALYSIS, ROUTINE W REFLEX MICROSCOPIC
BILIRUBIN URINE: NEGATIVE
Glucose, UA: NEGATIVE mg/dL
Ketones, ur: NEGATIVE mg/dL
NITRITE: NEGATIVE
PH: 5.5 (ref 5.0–8.0)
Protein, ur: NEGATIVE mg/dL
Specific Gravity, Urine: 1.011 (ref 1.005–1.030)
Urobilinogen, UA: 0.2 mg/dL (ref 0.0–1.0)

## 2014-05-18 LAB — CBC WITH DIFFERENTIAL/PLATELET
Basophils Absolute: 0 10*3/uL (ref 0.0–0.1)
Basophils Relative: 0 % (ref 0–1)
EOS ABS: 0 10*3/uL (ref 0.0–0.7)
Eosinophils Relative: 0 % (ref 0–5)
HCT: 30.9 % — ABNORMAL LOW (ref 39.0–52.0)
Hemoglobin: 9.9 g/dL — ABNORMAL LOW (ref 13.0–17.0)
LYMPHS PCT: 7 % — AB (ref 12–46)
Lymphs Abs: 0.7 10*3/uL (ref 0.7–4.0)
MCH: 31.2 pg (ref 26.0–34.0)
MCHC: 32 g/dL (ref 30.0–36.0)
MCV: 97.5 fL (ref 78.0–100.0)
Monocytes Absolute: 1.3 10*3/uL — ABNORMAL HIGH (ref 0.1–1.0)
Monocytes Relative: 12 % (ref 3–12)
NEUTROS PCT: 82 % — AB (ref 43–77)
Neutro Abs: 9 10*3/uL — ABNORMAL HIGH (ref 1.7–7.7)
PLATELETS: 65 10*3/uL — AB (ref 150–400)
RBC: 3.17 MIL/uL — AB (ref 4.22–5.81)
RDW: 20 % — ABNORMAL HIGH (ref 11.5–15.5)
WBC: 11 10*3/uL — AB (ref 4.0–10.5)

## 2014-05-18 LAB — PRO B NATRIURETIC PEPTIDE: Pro B Natriuretic peptide (BNP): 3964 pg/mL — ABNORMAL HIGH (ref 0–450)

## 2014-05-18 LAB — GLUCOSE, CAPILLARY
GLUCOSE-CAPILLARY: 148 mg/dL — AB (ref 70–99)
GLUCOSE-CAPILLARY: 153 mg/dL — AB (ref 70–99)
Glucose-Capillary: 140 mg/dL — ABNORMAL HIGH (ref 70–99)
Glucose-Capillary: 117 mg/dL — ABNORMAL HIGH (ref 70–99)
Glucose-Capillary: 133 mg/dL — ABNORMAL HIGH (ref 70–99)
Glucose-Capillary: 167 mg/dL — ABNORMAL HIGH (ref 70–99)

## 2014-05-18 LAB — HEMOGLOBIN A1C
Hgb A1c MFr Bld: 7.1 % — ABNORMAL HIGH (ref <5.7)
MEAN PLASMA GLUCOSE: 157 mg/dL — AB (ref <117)

## 2014-05-18 LAB — COMPREHENSIVE METABOLIC PANEL
ALT: 12 U/L (ref 0–53)
AST: 15 U/L (ref 0–37)
Albumin: 1.7 g/dL — ABNORMAL LOW (ref 3.5–5.2)
Alkaline Phosphatase: 90 U/L (ref 39–117)
Anion gap: 11 (ref 5–15)
BUN: 17 mg/dL (ref 6–23)
CALCIUM: 7.7 mg/dL — AB (ref 8.4–10.5)
CO2: 25 meq/L (ref 19–32)
CREATININE: 1.69 mg/dL — AB (ref 0.50–1.35)
Chloride: 100 mEq/L (ref 96–112)
GFR, EST AFRICAN AMERICAN: 43 mL/min — AB (ref >90)
GFR, EST NON AFRICAN AMERICAN: 37 mL/min — AB (ref >90)
GLUCOSE: 166 mg/dL — AB (ref 70–99)
Potassium: 4.2 mEq/L (ref 3.7–5.3)
Sodium: 136 mEq/L — ABNORMAL LOW (ref 137–147)
Total Bilirubin: 0.7 mg/dL (ref 0.3–1.2)
Total Protein: 4.8 g/dL — ABNORMAL LOW (ref 6.0–8.3)

## 2014-05-18 LAB — URINE CULTURE: Colony Count: >=100000

## 2014-05-18 LAB — URINE MICROSCOPIC-ADD ON

## 2014-05-18 MED ORDER — MIRTAZAPINE 15 MG PO TBDP
15.0000 mg | ORAL_TABLET | Freq: Every day | ORAL | Status: DC
  Filled 2014-05-18 (×2): qty 1

## 2014-05-18 MED ORDER — PANTOPRAZOLE SODIUM 40 MG PO TBEC
40.0000 mg | DELAYED_RELEASE_TABLET | Freq: Every day | ORAL | Status: DC
  Filled 2014-05-18 (×2): qty 1

## 2014-05-18 MED ORDER — HEPARIN (PORCINE) IN NACL 100-0.45 UNIT/ML-% IJ SOLN
850.0000 [IU]/h | INTRAMUSCULAR | Status: DC
  Administered 2014-05-18: 850 [IU]/h via INTRAVENOUS
  Filled 2014-05-18: qty 250

## 2014-05-18 MED ORDER — SODIUM CHLORIDE 0.9 % IV SOLN
1250.0000 mg | INTRAVENOUS | Status: DC
  Filled 2014-05-18 (×2): qty 1250

## 2014-05-18 MED ORDER — SODIUM CHLORIDE 0.9 % IJ SOLN
10.0000 mL | INTRAMUSCULAR | Status: DC | PRN
  Administered 2014-05-18 – 2014-05-23 (×2): 10 mL
  Filled 2014-05-18: qty 40

## 2014-05-18 MED ORDER — SODIUM CHLORIDE 0.9 % IV BOLUS (SEPSIS)
250.0000 mL | Freq: Once | INTRAVENOUS | Status: AC
  Administered 2014-05-18: 250 mL via INTRAVENOUS

## 2014-05-18 MED ORDER — METOPROLOL TARTRATE 12.5 MG HALF TABLET
12.5000 mg | ORAL_TABLET | Freq: Two times a day (BID) | ORAL | Status: DC
  Administered 2014-05-18: 12.5 mg via ORAL
  Filled 2014-05-18 (×2): qty 1

## 2014-05-18 MED ORDER — AMIODARONE HCL 200 MG PO TABS
400.0000 mg | ORAL_TABLET | Freq: Every day | ORAL | Status: DC
  Filled 2014-05-18: qty 2

## 2014-05-18 MED ORDER — PIPERACILLIN-TAZOBACTAM 3.375 G IVPB
3.3750 g | Freq: Three times a day (TID) | INTRAVENOUS | Status: DC
  Administered 2014-05-18: 3.375 g via INTRAVENOUS
  Filled 2014-05-18 (×6): qty 50

## 2014-05-18 NOTE — Progress Notes (Signed)
[place] for heparin Indication: DVT treatment of RUE/afib  Allergies  Allergen Reactions  . Exenatide     REACTION: nausea    Patient Measurements: Height: 5\' 11"  (180.3 cm) Weight: 193 lb 12.6 oz (87.9 kg) IBW/kg (Calculated) : 75.3  Vital Signs: Temp: 99.2 F (37.3 C) (11/04 1306) Temp Source: Oral (11/04 1306) BP: 91/51 mmHg (11/04 1306) Pulse Rate: 87 (11/04 1306)  Labs:  Recent Labs  05/17/14 0635 05/17/14 2138 05/18/14 0430  HGB 10.5* 11.4* 9.9*  HCT 32.4* 35.5* 30.9*  PLT 74* 71* 65*  CREATININE 1.64* 1.59* 1.69*    Assessment: 62 YOM with lung cancer and atrial fibrillation readmitted 11/3 with sepsis and hematuria after being discharged on same day.  Patient was started on anticoagulation during previous admission for new RUE DVT and was discharged on apixaban.  Apixaban has now been held and pharmacy consulted to start heparin infusion for continued treatment of recent DVT.  Significant events:  10/28: duplex of RUE reveals DVT (has implanted port in R chest wall), pharmacy asked to dose heparin gtt.  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  GI recommends continuing supportive care.  Started apixaban.  Apixaban education completed with patient, spouse, and daughter. 11/3: Discharged on apixaban (was on day #5 of therapy).  Re-admitted later in day with hematuria, epistaxis, fever.  Last dose of apixaban was a 10 mg dose given at 10:30am 11/3.  Today, 11/4  SCr 1.69, increased slightly since discharge.  CrCl~43 ml/min.    CBC: Hgb and platelets low. Patient having hematuria and epistaxis.  Hem/Onc following and no transfusion indicated until platelets drop to less than 20K.    RN reports urine clear with her today.  Goal of Therapy:  VTE treatment Heparin level 0.3-0.7 units/ml Monitor platelets by anticoagulation protocol: Yes  Plan:  1.  Start heparin infusion at 850 units/hr.  Patient previously achieved  therapeutic levels at lower end of therapeutic range at this infusion rate.  No bolus. 2.  Check heparin level in 8 hours. 3.  Daily heparin level and CBC while on heparin. 4.  F/u for any further reports of bleeding.  Hershal Coria, PharmD, BCPS Pager: [phone] 05/18/2014 1:42 PM

## 2014-05-18 NOTE — Progress Notes (Signed)
ANTIBIOTIC CONSULT NOTE - INITIAL  Pharmacy Consult for Vancomycin and Zosyn  Indication: Sepsis  Allergies  Allergen Reactions  . Exenatide     REACTION: nausea    Patient Measurements: Height: 5\' 11"  (180.3 cm) Weight: 193 lb 12.6 oz (87.9 kg) IBW/kg (Calculated) : 75.3 Adjusted Body Weight:   Vital Signs: Temp: 97.8 F (36.6 C) (11/04 0549) Temp Source: Oral (11/04 0549) BP: 121/78 mmHg (11/04 0549) Pulse Rate: 68 (11/04 0549) Intake/Output from previous day: 11/03 0701 - 11/04 0700 In: -  Out: 175 [Urine:175] Intake/Output from this shift: Total I/O In: -  Out: 175 [Urine:175]  Labs:  Recent Labs  05/17/14 0635 05/17/14 2138 05/18/14 0430  WBC 4.6 6.8 11.0*  HGB 10.5* 11.4* 9.9*  PLT 74* 71* 65*  CREATININE 1.64* 1.59* 1.69*   Estimated Creatinine Clearance: 37.7 mL/min (by C-G formula based on Cr of 1.69). No results for input(s): VANCOTROUGH, VANCOPEAK, VANCORANDOM, GENTTROUGH, GENTPEAK, GENTRANDOM, TOBRATROUGH, TOBRAPEAK, TOBRARND, AMIKACINPEAK, AMIKACINTROU, AMIKACIN in the last 72 hours.   Microbiology: Recent Results (from the past 720 hour(s))  Urine culture     Status: None   Collection Time: 05/16/14  8:43 AM  Result Value Ref Range Status   Specimen Description URINE, CLEAN CATCH  Final   Special Requests NONE  Final   Culture  Setup Time   Final    05/16/2014 17:23 Performed at [facility]   Final    >=100,000 COLONIES/ML Performed at [facility]    Culture   Final    ENTEROCOCCUS SPECIES Performed at [facility]    Report Status 05/18/2014 FINAL  Final   Organism ID, Bacteria ENTEROCOCCUS SPECIES  Final      Susceptibility   Enterococcus species - MIC*    AMPICILLIN <=2 SENSITIVE Sensitive     LEVOFLOXACIN 1 SENSITIVE Sensitive     NITROFURANTOIN <=16 SENSITIVE Sensitive     VANCOMYCIN 1 SENSITIVE Sensitive     TETRACYCLINE <=1 SENSITIVE Sensitive     * ENTEROCOCCUS SPECIES     Medical History: Past Medical History  Diagnosis Date  . Hypertension   . COPD (chronic obstructive pulmonary disease)   . GERD (gastroesophageal reflux disease)   . Renal insufficiency   . Hyperlipidemia   . CAD (coronary artery disease)   . B12 deficiency   . Memory loss   . Vertigo   . Diabetes mellitus     type II, poly neuropathy  . Overweight 01/31/2013  . Lung cancer, hilus 03/22/2014    Medications:  Anti-infectives    Start     Dose/Rate Route Frequency Ordered Stop   05/18/14 2200  vancomycin (VANCOCIN) 1,250 mg in sodium chloride 0.9 % 250 mL IVPB     1,250 mg166.7 mL/hr over 90 Minutes Intravenous Every 24 hours 05/18/14 0627     05/18/14 0630  piperacillin-tazobactam (ZOSYN) IVPB 3.375 g     3.375 g12.5 mL/hr over 240 Minutes Intravenous 3 times per day 05/18/14 0627     05/17/14 2300  piperacillin-tazobactam (ZOSYN) IVPB 3.375 g     3.375 g100 mL/hr over 30 Minutes Intravenous  Once 05/17/14 2248 05/17/14 2345   05/17/14 2300  vancomycin (VANCOCIN) IVPB 1000 mg/200 mL premix     1,000 mg200 mL/hr over 60 Minutes Intravenous  Once 05/17/14 2248 05/18/14 0148     Assessment: Patient with sepsis.  First dose of antibiotics already given.  Goal of Therapy:  Vancomycin trough level 15-20 mcg/ml  Zosyn based on renal function   Plan:  Measure antibiotic drug levels at steady state Follow up culture results Vancomycin 1250mg  iv q24hr  Zosyn 3.375g IV Q8H infused over 4hrs.   Tyler Deis, Shea Stakes [place] 05/18/2014,6:28 AM

## 2014-05-18 NOTE — Progress Notes (Signed)
Pt refused IV placement to receive antibiotics.  Pt educated about the need for the antibiotics.  Pt states that he wants his body to take the natural course and does not want to prolong his life.

## 2014-05-18 NOTE — Progress Notes (Signed)
CARE MANAGEMENT NOTE 05/18/2014  Patient:  Gregory Farrell, Gregory Farrell   Account Number:  [id number]  Date Initiated:  05/18/2014  Documentation initiated by:  Karl Bales  Subjective/Objective Assessment:   80 yo pt admitted with T103.2, gross hematuria sepsis after being discharged for 6 hrs.     Action/Plan:   from home   Anticipated DC Date:  05/21/2014   Anticipated DC Plan:  [place]  In-house referral  Clinical Social Worker      DC Planning Services  CM consult      Choice offered to / List presented to:             Status of service:  In process, will continue to follow Medicare Important Message given?   (If response is "NO", the following Medicare IM given date fields will be blank) Date Medicare IM given:   Medicare IM given by:   Date Additional Medicare IM given:   Additional Medicare IM given by:    Discharge Disposition:    Per UR Regulation:  Reviewed for med. necessity/level of care/duration of stay  If discussed at [place] of Stay Meetings, dates discussed:   05/18/2014    Comments:  05/17/14 MMcGibboney, RN, BSN Chart reviewed. Spoke with pt's daughter Lovey Newcomer at bedside, she would like more information concerning Hospice, Palliative Care or SNF.  Pt discharged home with [place] 05/17/14 yesterday and returned back to [facility] after being home for 6 hrs. Daughter states that pt had blood coming from every where, penis, arm with a Temp of 103.5 at home. Pt returned to [facility] with a temp of 103.2.

## 2014-05-18 NOTE — Progress Notes (Signed)
Pt HR sustaining 140s. BP 91/51, Temp 99.2.  Notified Dr. Tyrell Antonio. Started NS 250 ml bolus.  Notified Dr Claiborne Billings.  Will continue to monitor.

## 2014-05-18 NOTE — Progress Notes (Signed)
Pt. A&Ox3 and refusing IV placement for IV antibiotics. RN notified. Will type a progress note and notify MD.  Renaldo Fiddler, RN IV Team

## 2014-05-18 NOTE — Progress Notes (Signed)
Subjective:  Pt dc'd yesterday and now re-admitted.Pt was seen by Dr. Ron Parker during hospitalization who was folowing his AF. We are asked to see again and continue to follow patient. According to the patient's daughter, he went home yesterday, developed significant gross hematuria and had epistasis and became febrile up to 103F. He had been on Eliquis for 5 days.  Objective:   Vital Signs in the last 24 hours: Temp:  [97.8 F (36.6 C)-103.2 F (39.6 C)] 97.8 F (36.6 C) (11/04 0549) Pulse Rate:  [68-98] 68 (11/04 0549) Resp:  [16-26] 20 (11/04 0549) BP: (80-124)/(42-78) 121/78 mmHg (11/04 0549) SpO2:  [92 %-100 %] 99 % (11/04 0845) Weight:  [193 lb 12.6 oz (87.9 kg)] 193 lb 12.6 oz (87.9 kg) (11/04 0049)  Intake/Output from previous day: 11/03 0701 - 11/04 0700 In: -  Out: 175 [Urine:175]  Medications: . amiodarone  400 mg Oral BID  . atorvastatin  80 mg Oral q1800  . dronabinol  2.5 mg Oral BID AC  . insulin aspart  0-9 Units Subcutaneous 6 times per day  . insulin detemir  3 Units Subcutaneous Daily  . lactulose  20 g Oral BID  . mometasone-formoterol  2 puff Inhalation BID  . pantoprazole sodium  40 mg Oral BID  . piperacillin-tazobactam (ZOSYN)  IV  3.375 g Intravenous 3 times per day  . sucralfate  1 g Oral TID WC & HS  . vancomycin  1,250 mg Intravenous Q24H    . sodium chloride 125 mL/hr at 05/17/14 2221  . sodium chloride 10 mL/hr at 05/18/14 0049    Physical Exam:   General appearance: alert, cooperative and no distress Neck: no adenopathy, no carotid bruit, no JVD, supple, symmetrical, trachea midline and thyroid not enlarged, symmetric, no tenderness/mass/nodules Lungs: decreased breath sounds; no wheezing Heart: irregularly irregular rhythm, over 6 systolic murmur.  No rubs thrills or heaves. Abdomen: soft, non-tender; bowel sounds normal; no masses,  no organomegaly Extremities: 2+ pitting edema to his ankles; Skin: Skin color, texture, turgor normal. No  rashes or lesions or previous oozing from his arms, mildly erythematous Neurologic: Grossly normal   Rate: 80  Rhythm: atrial fibrillation  Lab Results:   Recent Labs  05/17/14 2138 05/18/14 0430  NA 134* 136*  K 4.1 4.2  CL 97 100  CO2 24 25  GLUCOSE 154* 166*  BUN 16 17  CREATININE 1.59* 1.69*    No results for input(s): TROPONINI in the last 72 hours.  Invalid input(s): CK, MB  Hepatic Function Panel  Recent Labs  05/18/14 0430  PROT 4.8*  ALBUMIN 1.7*  AST 15  ALT 12  ALKPHOS 90  BILITOT 0.7   No results for input(s): INR in the last 72 hours. BNP (last 3 results)  Recent Labs  05/18/14 0430  PROBNP 3964.0*    Lipid Panel     Component Value Date/Time   CHOL 172 05/08/2014 0355   TRIG 214* 05/08/2014 0355   HDL 30* 05/08/2014 0355   CHOLHDL 5.7 05/08/2014 0355   VLDL 43* 05/08/2014 0355   LDLCALC 99 05/08/2014 0355   LDLDIRECT 85.4 04/06/2008 1303      Imaging:  Dg Chest 2 View  05/17/2014   CLINICAL DATA:  Hematuria.  Nose bleed.  EXAM: CHEST  2 VIEW  COMPARISON:  05/07/2014.  FINDINGS: RIGHT IJ power port. Cardiopericardial silhouette is mildly enlarged. Small bilateral pleural effusions are present, right-greater-than-left. Associated atelectasis. No focal airspace consolidation although difficult to exclude based  on basilar atelectasis.Aortic arch atherosclerosis. Probable coronary artery stents.  IMPRESSION: Small bilateral pleural effusions and mild cardiomegaly.   Electronically Signed   By: Dereck Ligas M.D.   On: 05/17/2014 22:35      Assessment/Plan:   1. Atrial Fibrillation with a controlled ventricular response, currently being loaded with amiodarone 400 mg twice a day with plans to decrease to 400 mg daily for 2 weeks and then 200 mg daily. 2. Hematuria and epistasis hollowing initiation of Eliquis with recent thrombocytopenia at 71,000 contributing to bleeding 3. Volume  overload with peripheral edema with an elevated BNP  at 3964 as well as hypoalbuminemia contributing to his edema. 4. Renal insufficiency 5.  Squamous cell carcinoma of the left lung status post prior chemotherapy and radiation treatment. 6. Diabetes mellitus with renal insufficiency 7. Thrombocytopenia  Presently the patient's atrial fibrillation rate is controlled with heart rate in the 80s.  We will decrease his amiodarone to 400 mg daily. In light of his thrombocytopenia, nosebleed, and hematuria, anticoagulation is on hold. Previous echo Doppler study on 05/08/2014 showed an ejection fraction of 55-60% with grade 1 diastolic dysfunction and abnormal tissue Doppler.  There was moderate concentric and severe focal basal septal hypertrophy.  There was mild mitral and tricuspid regurgitation. He does have 2+ pitting edema to his lower extremity which is contributed by  acute on chronic combined heart failure as well as his hyponatremia.  Will add Lasix 40 mg orally to his regimen.  He is being treated for sepsis and is on antibiotic therapy per his primary team.     Troy Sine, MD, [facility] 05/18/2014, 11:30 AM

## 2014-05-18 NOTE — Progress Notes (Signed)
[place]  [phone]   Requesting Provider: [facility]  Consulting Provider: Allen Kell  Primary Oncologist: Riley Churches CONSULTATION  NOTE   HPI: 80 year old man with a history of Squamous cell carcinoma on chemo and radiation as described below, status post C5 chemo with [place] and Taxol with concurrent radiation, now with no further aggressive treatments due to clinical decline, admitted on 11/4 after a recent hospitalization from 10/24-11/3 for cardiac issues. His hospitalization was complicated by encephalopathy, RUE DVT on Eliquis, Afib w/RVR now on amio and metoprolol, radiation esophagitis, acute hypoxic respiratory failure among other issues. Of note, his urinary foley catheter was removed 5 days prior to this admission.  Only 2 hours after discharge, the patient presented with gross hematuria, mild epistaxis and sepsis, with a fever up to 103 F. He had been on Eliquis for 5 days. On admission his White blood cell count 6.8, hemoglobin 11.4. His platelets were 71,000. Eliquis was placed on hold, and he was placed on CDs for DVT prophylaxis. No PT/INR was checked on admission for review. Chest X Ray showed small bilateral pleural effusions. Urine Cultures and blood cultures are pending. He was placed on antibiotics with Vanco and Zosyn. CT of the abdomen and pelvis with contrast is to be performed when renal function improves. Urology to see the patient this morning.  We were kindly informed of the patient's admission.  Principle Diagnosis:  Squamous cell carcinoma of the left lung-likely stage IIIB  Prior Therapy:  Radiation therapy with weekly low-dose chemotherapy with carboplatin/Taxol, last  cycle  5 on 05/05/14.  No further chemo treatments are planned for this patient, they have been discontinued.   MEDICATIONS:  Scheduled Meds: . amiodarone  400 mg Oral BID  . atorvastatin  80 mg Oral q1800  . dronabinol  2.5  mg Oral BID AC  . insulin aspart  0-9 Units Subcutaneous 6 times per day  . insulin detemir  3 Units Subcutaneous Daily  . lactulose  20 g Oral BID  . mometasone-formoterol  2 puff Inhalation BID  . pantoprazole sodium  40 mg Oral BID  . piperacillin-tazobactam (ZOSYN)  IV  3.375 g Intravenous 3 times per day  . sucralfate  1 g Oral TID WC & HS  . vancomycin  1,250 mg Intravenous Q24H   Continuous Infusions: . sodium chloride 125 mL/hr at 05/17/14 2221  . sodium chloride 10 mL/hr at 05/18/14 0049   PRN Meds:.levalbuterol, ondansetron, sodium chloride, zolpidem  ALLERGIES:  Allergies  Allergen Reactions  . Exenatide     REACTION: nausea     PHYSICAL EXAMINATION:   Filed Vitals:   05/18/14 0549  BP: 121/78  Pulse: 68  Temp: 97.8 F (36.6 C)  Resp: 20   Filed Weights   05/18/14 0049  Weight: 193 lb 12.6 oz (87.52 kg)    80 year old in no acute distress, ill appearing HEENT: Normocephalic, atraumatic.Sclera anicteric.Oral cavity without thrush or lesions. Neck:  Supple. No thyromegaly,no cervical or supraclavicular adenopathy  Lungs: Decreased breath sounds at the bases.  No wheezing, rhonchi or rales. Cardiac: Regular rate and rhythm, no murmur,rubs or gallops, occasional ectopic beat. Abdomen: Soft nontender,bowel sounds x4. Nohepatosplenomegaly Extremities: No clubbing cyanosis, trace edema bilaterally. right arm shows swelling.  No petechial rash. Very dry skin. Neuro: No focal or motor deficits  LABORATORY/RADIOLOGY DATA:   Recent Labs Lab 05/15/14 0455 05/16/14 0455 05/17/14 0635 05/17/14 2138 05/18/14 0430  WBC 5.3 4.8 4.6  6.8 11.0*  HGB 10.8* 10.3* 10.5* 11.4* 9.9*  HCT 33.3* 32.7* 32.4* 35.5* 30.9*  PLT 123* 99* 74* 71* 65*  MCV 95.1 97.6 97.3 95.4 97.5  MCH 30.9 30.7 31.5 30.6 31.2  MCHC 32.4 31.5 32.4 32.1 32.0  RDW 20.4* 20.5* 20.4* 19.7* 20.0*  LYMPHSABS 0.6* 0.7 0.7 0.2* 0.7  MONOABS 0.9 1.2* 1.2* 0.3 1.3*  EOSABS 0.0 0.0 0.0 0.0 0.0    BASOSABS 0.0 0.0 0.0 0.0 0.0    CMP    Recent Labs Lab 05/15/14 0455 05/16/14 0455 05/17/14 0635 05/17/14 2138 05/18/14 0430  NA 143 139 138 134* 136*  K 4.0 3.9 4.0 4.1 4.2  CL 106 101 101 97 100  CO2 28 27 26 24 25   GLUCOSE 142* 164* 129* 154* 166*  BUN 6 12 16 16 17   CREATININE 1.53* 1.62* 1.64* 1.59* 1.69*  CALCIUM 8.1* 8.0* 8.1* 8.2* 7.7*  AST  --   --   --  20 15  ALT  --   --   --  14 12  ALKPHOS  --   --   --  99 90  BILITOT  --   --   --  0.6 0.7        Component Value Date/Time   BILITOT 0.7 05/18/2014 0430   BILITOT 0.70 05/05/2014 1332   BILIDIR 0.1 02/11/2014 0923   IBILI 0.6 02/11/2014 0923    Anemia panel:   No results for input(s): VITAMINB12, FOLATE, FERRITIN, TIBC, IRON, RETICCTPCT in the last 72 hours.  No results for input(s): TSH, T4TOTAL, T3FREE, THYROIDAB in the last 72 hours.  Invalid input(s): FREET3     Urinalysis    Component Value Date/Time   COLORURINE YELLOW 05/18/2014 0552   APPEARANCEUR CLOUDY* 05/18/2014 0552   LABSPEC 1.011 05/18/2014 0552   PHURINE 5.5 05/18/2014 0552   GLUCOSEU NEGATIVE 05/18/2014 0552   HGBUR LARGE* 05/18/2014 0552   BILIRUBINUR NEGATIVE 05/18/2014 0552   KETONESUR NEGATIVE 05/18/2014 0552   PROTEINUR NEGATIVE 05/18/2014 0552   UROBILINOGEN 0.2 05/18/2014 0552   NITRITE NEGATIVE 05/18/2014 0552   LEUKOCYTESUR SMALL* 05/18/2014 0552    Drugs of Abuse  No results found for: LABOPIA   Liver Function Tests:  Recent Labs Lab 05/17/14 2138 05/18/14 0430  AST 20 15  ALT 14 12  ALKPHOS 99 90  BILITOT 0.6 0.7  PROT 5.5* 4.8*  ALBUMIN 2.1* 1.7*   CBG:  Recent Labs Lab 05/17/14 0756 05/17/14 1226 05/18/14 0055 05/18/14 0548 05/18/14 0738  GLUCAP 145* 254* 148* 153* 140*    Radiology Studies:  Dg Chest 2 View  05/17/2014 CLINICAL DATA: Hematuria. Nose bleed. EXAM: CHEST 2 VIEW COMPARISON: 05/07/2014. FINDINGS: RIGHT IJ power port. Cardiopericardial silhouette is mildly  enlarged. Small bilateral pleural effusions are present, right-greater-than-left. Associated atelectasis. No focal airspace consolidation although difficult to exclude based on basilar atelectasis.Aortic arch atherosclerosis. Probable coronary artery stents. IMPRESSION: Small bilateral pleural effusions and mild cardiomegaly. Electronically Signed By: Dereck Ligas M.D. On: 05/17/2014 22:35       ASSESSMENT AND PLAN:  Squamous cell carcinoma of the left lung-likely stage IIIB  s/p Radiation therapy with weekly low-dose chemotherapy with carboplatin/Taxol, latest cycle on 05/05/14, Cycle 5. No further chemo   Hematuria Urology to see, to rule out trauma after urine catheter removal CT of the abdomen and pelvis to be performed once renal functions improve.    Thrombocytopenia Secondary to bleeding, sepsis and polypharmacy among other issues.  Currently at 65,000.  No transfusion is indicated unless platelets drop to less than 20k as he is bleeding. Consider check protime and INR by Pharmacy   Sepsis All cultures are pending Patient on Vanco and Zosyn per pharmacy fever is resolved at this time  History of DVT right subclavian vein and axillary vein He was on Eliquis 5 mg bid  on admission, now placed on hold. On SCDs.    Chronic Renal Insufficiency Appreciate management by primary team.    PAF On antiarrhythmics with metoprolol as per Cards. Patient is on amiodarone which may affect INR . MAy need cardiology consultation while in hospital    Generalized weakness Patient could benefit from OT/PT while in hospital.   Malnutrition Controlled with Marinol.    [place], PA-C 05/18/2014, 8:41 AM  ADDENDUM:  He is now back after <24hrs at home.  Likely sepsis like problem. The hematuria could represent a UTI.    His right arm is less swollen.  The Eliquis will be d/c'ed.  His platelet count is low, but he is hypercoaguable. I think that Heparin is safe for  now.  He is very depressed.  He is tired of feeling badly and wants "it to be over."  I tried to tell him that he will get better and that all that he is going thru right now will improve.  I think that he might understand this, but clearly needs a lot of encouragement.    I think that he will do best will the least amount of medicines. He is tired of taking medicines.  I told him that his cancer will not kill him and that his heart will likely "take him" a lot sooner.  I agree with palliative care seeing him.  He needs a lot of PT.  He will likely need to go to SNF when he is clinically stable.  I do feel bad for him.  [name]  Romans 5:3-5
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
## 2014-05-19 ENCOUNTER — Encounter: Payer: Self-pay | Admitting: *Deleted

## 2014-05-19 ENCOUNTER — Ambulatory Visit: Payer: Medicare HMO

## 2014-05-19 DIAGNOSIS — Z515 Encounter for palliative care: Secondary | ICD-10-CM

## 2014-05-19 DIAGNOSIS — I4819 Other persistent atrial fibrillation: Secondary | ICD-10-CM

## 2014-05-19 DIAGNOSIS — R531 Weakness: Secondary | ICD-10-CM

## 2014-05-19 LAB — COMPREHENSIVE METABOLIC PANEL
ALT: 11 U/L (ref 0–53)
ANION GAP: 9 (ref 5–15)
AST: 16 U/L (ref 0–37)
Albumin: 1.8 g/dL — ABNORMAL LOW (ref 3.5–5.2)
Alkaline Phosphatase: 101 U/L (ref 39–117)
BUN: 21 mg/dL (ref 6–23)
CALCIUM: 8 mg/dL — AB (ref 8.4–10.5)
CO2: 26 meq/L (ref 19–32)
CREATININE: 1.76 mg/dL — AB (ref 0.50–1.35)
Chloride: 100 mEq/L (ref 96–112)
GFR, EST AFRICAN AMERICAN: 41 mL/min — AB (ref >90)
GFR, EST NON AFRICAN AMERICAN: 35 mL/min — AB (ref >90)
GLUCOSE: 101 mg/dL — AB (ref 70–99)
Potassium: 3.9 mEq/L (ref 3.7–5.3)
Sodium: 135 mEq/L — ABNORMAL LOW (ref 137–147)
TOTAL PROTEIN: 5 g/dL — AB (ref 6.0–8.3)
Total Bilirubin: 0.4 mg/dL (ref 0.3–1.2)

## 2014-05-19 LAB — GLUCOSE, CAPILLARY
GLUCOSE-CAPILLARY: 101 mg/dL — AB (ref 70–99)
GLUCOSE-CAPILLARY: 90 mg/dL (ref 70–99)
GLUCOSE-CAPILLARY: 98 mg/dL (ref 70–99)
Glucose-Capillary: 154 mg/dL — ABNORMAL HIGH (ref 70–99)

## 2014-05-19 LAB — CBC WITH DIFFERENTIAL/PLATELET
BASOS PCT: 0 % (ref 0–1)
Basophils Absolute: 0 10*3/uL (ref 0.0–0.1)
EOS PCT: 0 % (ref 0–5)
Eosinophils Absolute: 0 10*3/uL (ref 0.0–0.7)
HCT: 30.5 % — ABNORMAL LOW (ref 39.0–52.0)
HEMOGLOBIN: 9.8 g/dL — AB (ref 13.0–17.0)
LYMPHS PCT: 9 % — AB (ref 12–46)
Lymphs Abs: 0.8 10*3/uL (ref 0.7–4.0)
MCH: 31 pg (ref 26.0–34.0)
MCHC: 32.1 g/dL (ref 30.0–36.0)
MCV: 96.5 fL (ref 78.0–100.0)
MONOS PCT: 8 % (ref 3–12)
Monocytes Absolute: 0.7 10*3/uL (ref 0.1–1.0)
NEUTROS PCT: 83 % — AB (ref 43–77)
Neutro Abs: 7.1 10*3/uL (ref 1.7–7.7)
PLATELETS: 69 10*3/uL — AB (ref 150–400)
RBC: 3.16 MIL/uL — AB (ref 4.22–5.81)
RDW: 20 % — ABNORMAL HIGH (ref 11.5–15.5)
WBC: 8.6 10*3/uL (ref 4.0–10.5)

## 2014-05-19 LAB — APTT: APTT: 172 s — AB (ref 24–37)

## 2014-05-19 LAB — HEPARIN LEVEL (UNFRACTIONATED)

## 2014-05-19 MED ORDER — MORPHINE SULFATE 2 MG/ML IJ SOLN: 1.0000 mg | INTRAMUSCULAR | Status: DC | PRN

## 2014-05-19 MED ORDER — ZOLPIDEM TARTRATE 5 MG PO TABS: 5.0000 mg | ORAL_TABLET | Freq: Every evening | ORAL | Status: DC | PRN

## 2014-05-19 MED ORDER — BISACODYL 10 MG RE SUPP: 10.0000 mg | Freq: Every day | RECTAL | Status: DC | PRN

## 2014-05-19 MED ORDER — HEPARIN (PORCINE) IN NACL 100-0.45 UNIT/ML-% IJ SOLN
550.0000 [IU]/h | INTRAMUSCULAR | Status: DC
  Filled 2014-05-19: qty 250

## 2014-05-19 MED ORDER — LORAZEPAM 2 MG/ML IJ SOLN
1.0000 mg | INTRAMUSCULAR | Status: DC | PRN
  Administered 2014-05-23: 1 mg via INTRAVENOUS
  Filled 2014-05-19: qty 1

## 2014-05-19 MED ORDER — ZOLPIDEM TARTRATE 5 MG PO TABS
5.0000 mg | ORAL_TABLET | Freq: Every day | ORAL | Status: DC
  Administered 2014-05-19 – 2014-05-22 (×4): 5 mg via ORAL
  Filled 2014-05-19 (×4): qty 1

## 2014-05-19 NOTE — Progress Notes (Signed)
Subjective: Patient resting comfortably.  No SOB   Objective: Filed Vitals:   05/18/14 1306 05/18/14 1344 05/18/14 2014 05/19/14 0527  BP: 91/51 101/47 90/51 100/49  Pulse: 87 81 76 63  Temp: 99.2 F (37.3 C) 98.7 F (37.1 C) 99.5 F (37.5 C) 99 F (37.2 C)  TempSrc: Oral Oral Oral Oral  Resp:  18 18 20   Height:      Weight:      SpO2: 98% 97% 99% 99%   Weight change:   Intake/Output Summary (Last 24 hours) at 05/19/14 0832 Last data filed at 05/19/14 0525  Gross per 24 hour  Intake 454.35 ml  Output   1100 ml  Net -645.65 ml    General: Alert, awake, oriented x3, in no acute distress Neck:  JVP is normal Heart: Regular rate and rhythm, without murmurs, rubs, gallops.  Lungs: Clear to auscultation.  No rales or wheezes. Exemities:  Tr edema.   Neuro: Grossly intact, nonfocal.  Tele:  Afib   80s.     Lab Results: Results for orders placed or performed during the hospital encounter of 05/17/14 (from the past 24 hour(s))  Glucose, capillary     Status: Abnormal   Collection Time: 05/18/14 11:33 AM  Result Value Ref Range   Glucose-Capillary 117 (H) 70 - 99 mg/dL  Glucose, capillary     Status: Abnormal   Collection Time: 05/18/14  4:00 PM  Result Value Ref Range   Glucose-Capillary 133 (H) 70 - 99 mg/dL  Glucose, capillary     Status: Abnormal   Collection Time: 05/18/14  8:11 PM  Result Value Ref Range   Glucose-Capillary 167 (H) 70 - 99 mg/dL  Glucose, capillary     Status: Abnormal   Collection Time: 05/19/14 12:53 AM  Result Value Ref Range   Glucose-Capillary 101 (H) 70 - 99 mg/dL  Comprehensive metabolic panel     Status: Abnormal   Collection Time: 05/19/14  1:35 AM  Result Value Ref Range   Sodium 135 (L) 137 - 147 mEq/L   Potassium 3.9 3.7 - 5.3 mEq/L   Chloride 100 96 - 112 mEq/L   CO2 26 19 - 32 mEq/L   Glucose, Bld 101 (H) 70 - 99 mg/dL   BUN 21 6 - 23 mg/dL   Creatinine, Ser 1.76 (H) 0.50 - 1.35 mg/dL   Calcium 8.0 (L) 8.4 - 10.5 mg/dL     Total Protein 5.0 (L) 6.0 - 8.3 g/dL   Albumin 1.8 (L) 3.5 - 5.2 g/dL   AST 16 0 - 37 U/L   ALT 11 0 - 53 U/L   Alkaline Phosphatase 101 39 - 117 U/L   Total Bilirubin 0.4 0.3 - 1.2 mg/dL   GFR calc non Af Amer 35 (L) >90 mL/min   GFR calc Af Amer 41 (L) >90 mL/min   Anion gap 9 5 - 15  CBC WITH DIFFERENTIAL     Status: Abnormal   Collection Time: 05/19/14  1:35 AM  Result Value Ref Range   WBC 8.6 4.0 - 10.5 K/uL   RBC 3.16 (L) 4.22 - 5.81 MIL/uL   Hemoglobin 9.8 (L) 13.0 - 17.0 g/dL   HCT 30.5 (L) 39.0 - 52.0 %   MCV 96.5 78.0 - 100.0 fL   MCH 31.0 26.0 - 34.0 pg   MCHC 32.1 30.0 - 36.0 g/dL   RDW 20.0 (H) 11.5 - 15.5 %   Platelets 69 (L) 150 - 400 K/uL   Neutrophils  Relative % 83 (H) 43 - 77 %   Lymphocytes Relative 9 (L) 12 - 46 %   Monocytes Relative 8 3 - 12 %   Eosinophils Relative 0 0 - 5 %   Basophils Relative 0 0 - 1 %   Neutro Abs 7.1 1.7 - 7.7 K/uL   Lymphs Abs 0.8 0.7 - 4.0 K/uL   Monocytes Absolute 0.7 0.1 - 1.0 K/uL   Eosinophils Absolute 0.0 0.0 - 0.7 K/uL   Basophils Absolute 0.0 0.0 - 0.1 K/uL   RBC Morphology POLYCHROMASIA PRESENT    WBC Morphology MILD LEFT SHIFT (1-5% METAS, OCC MYELO, OCC BANDS)   Heparin level (unfractionated)     Status: Abnormal   Collection Time: 05/19/14  3:52 AM  Result Value Ref Range   Heparin Unfractionated >2.20 (H) 0.30 - 0.70 IU/mL  APTT     Status: Abnormal   Collection Time: 05/19/14  3:52 AM  Result Value Ref Range   aPTT 172 (H) 24 - 37 seconds  Glucose, capillary     Status: None   Collection Time: 05/19/14  5:24 AM  Result Value Ref Range   Glucose-Capillary 98 70 - 99 mg/dL  Glucose, capillary     Status: None   Collection Time: 05/19/14  7:48 AM  Result Value Ref Range   Glucose-Capillary 90 70 - 99 mg/dL    Studies/Results: No results found.  Medications: Reviewed   @PROBHOSP @  1.  Atrial fib  Remains in Afib  Now .  Continue amio 400 bid.  ON heparin for now.   Seen by P Ennever   No evid of  bleeding.   Would switch to Eliquis prior to d/c  WOuld observe for now though on heparin.   If remains in afib will need to consider for cardioversion at some point as outpatient after  Appears to be tolerating rates.      LOS: 2 days   Dorris Carnes 05/19/2014, 8:32 AM

## 2014-05-19 NOTE — Progress Notes (Signed)
Patient refused all bedtime medications and IV antibiotics despite RN encouragement to take.  Patient did take his insulin.  Will continue to monitor patient.

## 2014-05-19 NOTE — Progress Notes (Signed)
Gregory Farrell  Looks a little bit better this morning. His urine looks clear. I think that he had a urine infection may have had some sepsis from the urine infection. We'll see what the culture shows. He is on IV antibiotics.  His blood pressure is better. His temperature is down. It is 99. Heart rate is only 63. The Lopressor has been stopped.  He's not hurting. I am I sure how much he is eating. Again, I think there is an element of depression. I restarted his Remeron.  He is on heparin. His heparin level is quite high. Pharmacy is monitoring this. Again, there is no hematuria. He's had no bleeding. His right arm swelling has gone down quite a bit.  He has no mouth sores. He has no shortness of breath.  On his exam, his lungs sound pretty clear. He has no wheezes. Cardiac exam is regular rate and rhythm. Abdomen is soft. Bowel sounds are slightly decreased. Extremities shows mild nonpitting edema of the right arm. Left arm is unremarkable. Legs show no edema. Neurological exam is nonfocal.  Again, I suspect that he probably was here septic. He had a Foley catheter in with his last admission so this may have been the source of a UTI. He looks better.  I think it would be best to try to minimize the amount of medicines he has to take.  I think he desperately needs physical therapy. I don't think he would do this today. It would be nice just to give him a "day off" and just recover little bit.  I appreciate the wonderful care that he is getting from the staff on [place] 91:1-2

## 2014-05-19 NOTE — Progress Notes (Signed)
Notified by Wadie Lessen, NP, that patient, who is currently admitted to hospital, has made the decision to stop all treatment. He completed a MOST form and is now seeking a residential hospice upon discharge from the hospital. Dr Marin Olp notified.

## 2014-05-19 NOTE — Progress Notes (Addendum)
ANTICOAGULATION F/U NOTE   Pharmacy Consult for heparin Indication: DVT treatment of RUE/afib  Allergies  Allergen Reactions  . Exenatide     REACTION: nausea    Patient Measurements: Height: 5\' 11"  (180.3 cm) Weight: 193 lb 12.6 oz (87.9 kg) IBW/kg (Calculated) : 75.3  Vital Signs: Temp: 99 F (37.2 C) (11/05 0527) Temp Source: Oral (11/05 0527) BP: 100/49 mmHg (11/05 0527) Pulse Rate: 63 (11/05 0527)  Labs:  Recent Labs  05/17/14 2138 05/18/14 0430 05/19/14 0135 05/19/14 0352  HGB 11.4* 9.9* 9.8*  --   HCT 35.5* 30.9* 30.5*  --   PLT 71* 65* 69*  --   HEPARINUNFRC  --   --   --  >2.20*  CREATININE 1.59* 1.69* 1.76*  --     Assessment: 89 YOM with lung cancer and atrial fibrillation readmitted 11/3 with sepsis and hematuria after being discharged on same day.  Patient was started on anticoagulation during previous admission for new RUE DVT and was discharged on apixaban.  Apixaban has now been held and pharmacy consulted to start heparin infusion for continued treatment of recent DVT.  Significant events:  10/28: duplex of RUE reveals DVT (has implanted port in R chest wall), pharmacy asked to dose heparin gtt.  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  GI recommends continuing supportive care.  Started apixaban.  Apixaban education completed with patient, spouse, and daughter. 11/3: Discharged on apixaban (was on day #5 of therapy).  Re-admitted later in day with hematuria, epistaxis, fever.  Last dose of apixaban was a 10 mg dose given at 10:30am 11/3.   11/4  SCr 1.69, increased slightly since discharge.  CrCl~43 ml/min.    CBC: Hgb and platelets low. Patient having hematuria and epistaxis.  Hem/Onc following and no transfusion indicated until platelets drop to less than 20K.    RN reports urine clear with her today.  11/5  HL=>2.20,  Apixiban?   Plts=69 (stable 11/4 plts=65)   Goal of Therapy:  VTE treatment Heparin level 0.3-0.7 units/ml Monitor  platelets by anticoagulation protocol: Yes  Plan:  1.  Hold heparin drip X 30 mins 2.  Decrease heparin drip to 550 units/hr 3.  Daily heparin level and CBC while on heparin. 4.  Will order aPtt with next HL 5.  F/u for any further reports of bleeding.  Dorrene German 05/19/2014 5:32 AM
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
## 2014-05-20 ENCOUNTER — Ambulatory Visit: Payer: Medicare HMO

## 2014-05-20 DIAGNOSIS — N179 Acute kidney failure, unspecified: Secondary | ICD-10-CM | POA: Insufficient documentation

## 2014-05-20 LAB — CBC
HCT: 32.5 % — ABNORMAL LOW (ref 39.0–52.0)
Hemoglobin: 10.2 g/dL — ABNORMAL LOW (ref 13.0–17.0)
MCH: 30.7 pg (ref 26.0–34.0)
MCHC: 31.4 g/dL (ref 30.0–36.0)
MCV: 97.9 fL (ref 78.0–100.0)
PLATELETS: 62 10*3/uL — AB (ref 150–400)
RBC: 3.32 MIL/uL — AB (ref 4.22–5.81)
RDW: 20 % — AB (ref 11.5–15.5)
WBC: 6.1 10*3/uL (ref 4.0–10.5)

## 2014-05-20 LAB — HEPARIN LEVEL (UNFRACTIONATED)
Heparin Unfractionated: 1.5 IU/mL — ABNORMAL HIGH (ref 0.30–0.70)
Heparin Unfractionated: >2.2 IU/mL — ABNORMAL HIGH (ref 0.30–0.70)

## 2014-05-20 LAB — COMPREHENSIVE METABOLIC PANEL
ALBUMIN: 1.8 g/dL — AB (ref 3.5–5.2)
ALT: 15 U/L (ref 0–53)
ANION GAP: 11 (ref 5–15)
AST: 19 U/L (ref 0–37)
Alkaline Phosphatase: 84 U/L (ref 39–117)
BILIRUBIN TOTAL: 0.4 mg/dL (ref 0.3–1.2)
BUN: 16 mg/dL (ref 6–23)
CO2: 26 meq/L (ref 19–32)
Calcium: 8.3 mg/dL — ABNORMAL LOW (ref 8.4–10.5)
Chloride: 101 mEq/L (ref 96–112)
Creatinine, Ser: 1.6 mg/dL — ABNORMAL HIGH (ref 0.50–1.35)
GFR calc Af Amer: 46 mL/min — ABNORMAL LOW (ref >90)
GFR calc non Af Amer: 39 mL/min — ABNORMAL LOW (ref >90)
Glucose, Bld: 104 mg/dL — ABNORMAL HIGH (ref 70–99)
POTASSIUM: 4.3 meq/L (ref 3.7–5.3)
Sodium: 138 mEq/L (ref 137–147)
Total Protein: 5.2 g/dL — ABNORMAL LOW (ref 6.0–8.3)

## 2014-05-20 LAB — APTT
aPTT: 49 seconds — ABNORMAL HIGH (ref 24–37)
aPTT: 46 seconds — ABNORMAL HIGH (ref 24–37)

## 2014-05-20 MED ORDER — CITALOPRAM HYDROBROMIDE 20 MG PO TABS
20.0000 mg | ORAL_TABLET | Freq: Every day | ORAL | Status: DC
  Administered 2014-05-20 – 2014-05-23 (×4): 20 mg via ORAL
  Filled 2014-05-20 (×4): qty 1

## 2014-05-20 MED ORDER — SODIUM CHLORIDE 0.9 % IV SOLN
3.0000 g | Freq: Four times a day (QID) | INTRAVENOUS | Status: DC
  Administered 2014-05-20 – 2014-05-23 (×12): 3 g via INTRAVENOUS
  Filled 2014-05-20 (×13): qty 3

## 2014-05-20 MED ORDER — AMIODARONE HCL 200 MG PO TABS
400.0000 mg | ORAL_TABLET | Freq: Two times a day (BID) | ORAL | Status: DC
  Administered 2014-05-20 – 2014-05-23 (×7): 400 mg via ORAL
  Filled 2014-05-20 (×8): qty 2

## 2014-05-20 MED ORDER — LACTULOSE 10 GM/15ML PO SOLN
10.0000 g | Freq: Two times a day (BID) | ORAL | Status: DC
  Administered 2014-05-20 – 2014-05-22 (×5): 10 g via ORAL
  Filled 2014-05-20 (×8): qty 15

## 2014-05-20 MED ORDER — HEPARIN (PORCINE) IN NACL 100-0.45 UNIT/ML-% IJ SOLN
750.0000 [IU]/h | INTRAMUSCULAR | Status: AC
  Administered 2014-05-20: 550 [IU]/h via INTRAVENOUS
  Administered 2014-05-22: 750 [IU]/h via INTRAVENOUS
  Filled 2014-05-20 (×3): qty 250

## 2014-05-20 NOTE — Progress Notes (Addendum)
ANTICOAGULATION CONSULT NOTE - Follow Up Consult  Pharmacy Consult for heparin Indication: DVT treatment of RUE/afib  Allergies  Allergen Reactions  . Exenatide     REACTION: nausea    Patient Measurements: Height: 5\' 11"  (180.3 cm) Weight: 193 lb 12.6 oz (87.9 kg) IBW/kg (Calculated) : 75.3   Vital Signs: Temp: 98.3 F (36.8 C) (11/06 1354) Temp Source: Oral (11/06 1354) BP: 120/68 mmHg (11/06 1354) Pulse Rate: 72 (11/06 1354)  Labs:  Recent Labs  05/18/14 0430 05/19/14 0135 05/19/14 0352 05/20/14 0849 05/20/14 1556 05/20/14 1800  HGB 9.9* 9.8*  --  10.2*  --   --   HCT 30.9* 30.5*  --  32.5*  --   --   PLT 65* 69*  --  62*  --   --   APTT  --   --  172*  --   --  49*  HEPARINUNFRC  --   --  >2.20*  --  1.50*  --   CREATININE 1.69* 1.76*  --  1.60*  --   --     Estimated Creatinine Clearance: 39.9 mL/min (by C-G formula based on Cr of 1.6).  Assessment: Gregory Farrell with lung cancer and atrial fibrillation readmitted 11/3 with sepsis and hematuria after being discharged on same day.  Patient was started on anticoagulation during previous admission for new RUE DVT and was discharged on apixaban.  Apixaban has now been held and pharmacy consulted to start heparin infusion for continued treatment of recent DVT.  Significant events:  10/28: duplex of RUE reveals DVT (has implanted port in R chest wall), pharmacy asked to dose heparin gtt.  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  GI recommends continuing supportive care.  Started apixaban.  Apixaban education completed with patient, spouse, and daughter. 11/3: Discharged on apixaban (was on day #5 of therapy).  Re-admitted later in day with hematuria, epistaxis, fever.  Last dose of apixaban was a 10 mg dose given at 10:30am 11/3. 11/5 AM: Heparin level >2.20 and aPTT 172 with infusion at 850 units/hr (pt was previously therapeutic at this rate).  Drip held x 30 minutes and resumed at 550 units/hr.   11/5 PM: Heparin and  all other medications other than for comfort were discontinued following palliative care meeting 11/6: Patient agreed to resume care to treat DVT and infection to try to get better after discussion with oncology.  Resuming heparin drip this AM.  No bleeding since initial presentation at re-admission.  Today, 11/6  SCr 1.6. CrCl~40 ml/min.    CBC: Hgb and platelets low, but fairly stable.   RN reports urine clear during her shift.    Difficult to interpret disparate coagulation results (significantly elevated heparin level vs aPTT below target therapeutic range).  ? May still be seeing effects of Apixaban on heparin level.  Of note, labs drawn 2 hours prior to scheduled time (lab confirms both drawn from same sample despite different times displayed in EPIC).    Goal of Therapy:  VTE treatment Heparin level 0.3-0.7 units/ml Monitor platelets by anticoagulation protocol: Yes  Plan:  1. Given disparate coag results and issue of timing of lab draws, will re-check heparin level and aPTT STAT to ensure appropriate assessment of steady state levels.  Continue heparin drip at 550 units/hr until labs result. 2.  Daily heparin level and CBC while on heparin. 3.  F/u for any reports of bleeding.   Lindell Spar, PharmD, BCPS Pager: [phone] 05/20/2014 8:39 PM   Addendum:  Repeat heparin level supratherapeutic, but aPTT remains lower than target therapeutic range.  Suspect still seeing effects of Apixaban on heparin level given AKI.  Will increase heparin drip to 600 units/hr.  Recheck heparin level and aPTT in 8 hours.  Monitor closely for signs/symptoms of bleeding.  Lindell Spar, PharmD, BCPS Pager: [phone] 05/20/2014 10:38 PM
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
## 2014-05-20 NOTE — Progress Notes (Signed)
CSW spoke with patient's daughter, Gregory Farrell via phone re: SNF decision, bed offers were given. Daughter to speak with Dr. Marin Olp & get back to CSW with SNF decision.   Clinical Social Work Department CLINICAL SOCIAL WORK PLACEMENT NOTE 05/20/2014  Patient:  Gregory Farrell, Gregory Farrell  Account Number:  [id number] Admit date:  05/17/2014  Clinical Social Worker:  Renold Genta  Date/time:  05/20/2014 03:26 PM  Clinical Social Work is seeking post-discharge placement for this patient at the following level of care:   SKILLED NURSING   (*CSW will update this form in Epic as items are completed)   05/20/2014  Patient/family provided with [facility] Department of Clinical Social Work's list of facilities offering this level of care within the geographic area requested by the patient (or if unable, by the patient's family).  05/20/2014  Patient/family informed of their freedom to choose among providers that offer the needed level of care, that participate in Medicare, Medicaid or managed care program needed by the patient, have an available bed and are willing to accept the patient.  05/20/2014  Patient/family informed of [facility]' ownership interest in [facility], as well as of the fact that they are under no obligation to receive care at this facility.  PASARR submitted to EDS on 05/20/2014 PASARR number received on 05/20/2014  FL2 transmitted to all facilities in geographic area requested by pt/family on  05/20/2014 FL2 transmitted to all facilities within larger geographic area on   Patient informed that his/her managed care company has contracts with or will negotiate with  certain facilities, including the following:   [facility]     Patient/family informed of bed offers received:  05/20/2014 Patient chooses bed at  Physician recommends and patient chooses bed at    Patient to be transferred to  on   Patient to be transferred to facility by  Patient and family notified of  transfer on  Name of family member notified:    The following physician request were entered in Epic:   Additional Comments:   Raynaldo Opitz, [place] Social Worker cell #: [phone]

## 2014-05-20 NOTE — Progress Notes (Signed)
Progress Note from the Palliative Medicine Team at [facility]:   -pateint is alert and oriented, OOB to chair.  Today he tell me he is "back to trying to treat", hopeful for improvement and return to baseline  -expects to be discharged to SNF for rehabilitation   Objective: Allergies  Allergen Reactions  . Exenatide     REACTION: nausea   Scheduled Meds: . amiodarone  400 mg Oral BID  . ampicillin-sulbactam (UNASYN) IV  3 g Intravenous Q6H  . citalopram  20 mg Oral Daily  . lactulose  10 g Oral BID  . zolpidem  5 mg Oral QHS   Continuous Infusions: . sodium chloride 50 mL/hr at 05/18/14 1131  . sodium chloride 10 mL/hr at 05/18/14 0049  . heparin 550 Units/hr (05/20/14 1014)   PRN Meds:.bisacodyl, LORazepam, morphine injection, ondansetron, sodium chloride  BP 118/57 mmHg  Pulse 65  Temp(Src) 98.5 F (36.9 C) (Oral)  Resp 16  Ht 5\' 11"  (1.803 m)  Wt 87.9 kg (193 lb 12.6 oz)  BMI 27.04 kg/m2  SpO2 95%   PPS:30 %    Intake/Output Summary (Last 24 hours) at 05/20/14 1240 Last data filed at 05/19/14 2047  Gross per 24 hour  Intake      0 ml  Output    400 ml  Net   -400 ml        Physical Exam:  General: ill appearing, NAD, weak, pale HEENT: moist buccal membranes, no exudate Chest: Decreased in bases, CTA CVS: RRR Abdomen:soft, NT  Ext: without edema Neuro: alert and oriented X3 Psych: with capacity and good insight into his medical situation, very clear today regarding his shift in [place]  Labs: CBC    Component Value Date/Time   WBC 6.1 05/20/2014 0849   WBC 3.8* 05/05/2014 1331   WBC 1.3* 04/12/2014 1317   RBC 3.32* 05/20/2014 0849   RBC 3.98* 05/05/2014 1331   RBC 4.13* 04/12/2014 1317   HGB 10.2* 05/20/2014 0849   HGB 12.2* 05/05/2014 1331   HGB 11.8* 04/12/2014 1317   HCT 32.5* 05/20/2014 0849   HCT 38.2* 05/05/2014 1331   HCT 37.4* 04/12/2014 1317   PLT 62* 05/20/2014 0849   PLT 156 05/05/2014 1331   PLT 99* 04/12/2014  1317   MCV 97.9 05/20/2014 0849   MCV 96 05/05/2014 1331   MCV 90.7 04/12/2014 1317   MCH 30.7 05/20/2014 0849   MCH 30.7 05/05/2014 1331   MCH 28.7 04/12/2014 1317   MCHC 31.4 05/20/2014 0849   MCHC 31.9* 05/05/2014 1331   MCHC 31.6* 04/12/2014 1317   RDW 20.0* 05/20/2014 0849   RDW 18.7* 05/05/2014 1331   RDW 14.9* 04/12/2014 1317   LYMPHSABS 0.8 05/19/2014 0135   LYMPHSABS 0.5* 05/05/2014 1331   LYMPHSABS 0.2* 04/12/2014 1317   MONOABS 0.7 05/19/2014 0135   MONOABS 0.1 04/12/2014 1317   EOSABS 0.0 05/19/2014 0135   EOSABS 0.1 05/05/2014 1331   EOSABS 0.0 04/12/2014 1317   BASOSABS 0.0 05/19/2014 0135   BASOSABS 0.0 05/05/2014 1331   BASOSABS 0.0 04/12/2014 1317    BMET    Component Value Date/Time   NA 138 05/20/2014 0849   NA 138 05/05/2014 1332   NA 136 04/12/2014 1317   K 4.3 05/20/2014 0849   K 4.3 05/05/2014 1332   K 4.6 04/12/2014 1317   CL 101 05/20/2014 0849   CL 98 05/05/2014 1332   CO2 26 05/20/2014 0849   CO2 27 05/05/2014  1332   CO2 26 04/12/2014 1317   GLUCOSE 104* 05/20/2014 0849   GLUCOSE 145* 05/05/2014 1332   GLUCOSE 158* 04/12/2014 1317   BUN 16 05/20/2014 0849   BUN 9 05/05/2014 1332   BUN 22.3 04/12/2014 1317   CREATININE 1.60* 05/20/2014 0849   CREATININE 1.4* 05/05/2014 1332   CREATININE 1.5* 04/12/2014 1317   CALCIUM 8.3* 05/20/2014 0849   CALCIUM 8.9 05/05/2014 1332   CALCIUM 9.2 04/12/2014 1317   GFRNONAA 39* 05/20/2014 0849   GFRAA 46* 05/20/2014 0849    CMP     Component Value Date/Time   NA 138 05/20/2014 0849   NA 138 05/05/2014 1332   NA 136 04/12/2014 1317   K 4.3 05/20/2014 0849   K 4.3 05/05/2014 1332   K 4.6 04/12/2014 1317   CL 101 05/20/2014 0849   CL 98 05/05/2014 1332   CO2 26 05/20/2014 0849   CO2 27 05/05/2014 1332   CO2 26 04/12/2014 1317   GLUCOSE 104* 05/20/2014 0849   GLUCOSE 145* 05/05/2014 1332   GLUCOSE 158* 04/12/2014 1317   BUN 16 05/20/2014 0849   BUN 9 05/05/2014 1332   BUN 22.3 04/12/2014  1317   CREATININE 1.60* 05/20/2014 0849   CREATININE 1.4* 05/05/2014 1332   CREATININE 1.5* 04/12/2014 1317   CALCIUM 8.3* 05/20/2014 0849   CALCIUM 8.9 05/05/2014 1332   CALCIUM 9.2 04/12/2014 1317   PROT 5.2* 05/20/2014 0849   PROT 6.3* 05/05/2014 1332   ALBUMIN 1.8* 05/20/2014 0849   AST 19 05/20/2014 0849   AST 19 05/05/2014 1332   ALT 15 05/20/2014 0849   ALT 15 05/05/2014 1332   ALKPHOS 84 05/20/2014 0849   ALKPHOS 95* 05/05/2014 1332   BILITOT 0.4 05/20/2014 0849   BILITOT 0.70 05/05/2014 1332   GFRNONAA 39* 05/20/2014 0849   GFRAA 46* 05/20/2014 0849     Assessment and Plan: 1. Code Status:  DNR/DNI 2. Symptom Control:        Weakness: PT/OT as tolerated, dc to SNF for rehabilitation 3. Psycho/Social:  Emotional support offered to patient and his daughter by phone, all understand the high risk for decompensation  4. Spiritual: strong community church support 5. Disposition:  Likely a SNF for rehabilitation   Time In Time Out Total Time Spent with Patient Total Overall Time  1100 1125 25 min 25 min    Greater than 50%  of this time was spent counseling and coordinating care related to the above assessment and plan.  Wadie Lessen NP  Palliative Medicine Team Team Phone # [phone] Pager [phone]  Discussed with Claiborne Billings LCSW 1

## 2014-05-20 NOTE — Progress Notes (Signed)
ANTIBIOTIC CONSULT NOTE - INITIAL  Pharmacy Consult for Unasyn Indication: UTI, sepsis  Allergies  Allergen Reactions  . Exenatide     REACTION: nausea    Patient Measurements: Height: 5\' 11"  (180.3 cm) Weight: 193 lb 12.6 oz (87.9 kg) IBW/kg (Calculated) : 75.3  Vital Signs: Temp: 98.5 F (36.9 C) (11/06 0646) Temp Source: Oral (11/06 0646) BP: 118/57 mmHg (11/06 0646) Pulse Rate: 65 (11/06 0646) Intake/Output from previous day: 11/05 0701 - 11/06 0700 In: 0  Out: 400 [Urine:400] Intake/Output from this shift:    Labs:  Recent Labs  05/17/14 2138 05/18/14 0430 05/19/14 0135  WBC 6.8 11.0* 8.6  HGB 11.4* 9.9* 9.8*  PLT 71* 65* 69*  CREATININE 1.59* 1.69* 1.76*   Estimated Creatinine Clearance: 36.2 mL/min (by C-G formula based on Cr of 1.76). No results for input(s): VANCOTROUGH, VANCOPEAK, VANCORANDOM, GENTTROUGH, GENTPEAK, GENTRANDOM, TOBRATROUGH, TOBRAPEAK, TOBRARND, AMIKACINPEAK, AMIKACINTROU, AMIKACIN in the last 72 hours.   Microbiology: Recent Results (from the past 720 hour(s))  Urine culture     Status: None   Collection Time: 05/16/14  8:43 AM  Result Value Ref Range Status   Specimen Description URINE, CLEAN CATCH  Final   Special Requests NONE  Final   Culture  Setup Time   Final    05/16/2014 17:23 Performed at [facility]   Final    >=100,000 COLONIES/ML Performed at [facility]    Culture   Final    ENTEROCOCCUS SPECIES Performed at [facility]    Report Status 05/18/2014 FINAL  Final   Organism ID, Bacteria ENTEROCOCCUS SPECIES  Final      Susceptibility   Enterococcus species - MIC*    AMPICILLIN <=2 SENSITIVE Sensitive     LEVOFLOXACIN 1 SENSITIVE Sensitive     NITROFURANTOIN <=16 SENSITIVE Sensitive     VANCOMYCIN 1 SENSITIVE Sensitive     TETRACYCLINE <=1 SENSITIVE Sensitive     * ENTEROCOCCUS SPECIES  Culture, blood (routine x 2)     Status: None (Preliminary result)   Collection  Time: 05/17/14  9:38 PM  Result Value Ref Range Status   Specimen Description BLOOD LEFT ANTECUBITAL  Final   Special Requests BOTTLES DRAWN AEROBIC AND ANAEROBIC 5CC AND 3CC  Final   Culture  Setup Time   Final    05/18/2014 01:06 Performed at [facility]    Culture   Final           BLOOD CULTURE RECEIVED NO GROWTH TO DATE CULTURE WILL BE HELD FOR 5 DAYS BEFORE ISSUING A FINAL NEGATIVE REPORT Performed at [facility]    Report Status PENDING  Incomplete  Culture, blood (routine x 2)     Status: None (Preliminary result)   Collection Time: 05/17/14 10:57 PM  Result Value Ref Range Status   Specimen Description BLOOD PORTA CATH  Final   Special Requests BOTTLES DRAWN AEROBIC AND ANAEROBIC 3CC  Final   Culture  Setup Time   Final    05/18/2014 01:07 Performed at [facility]    Culture   Final           BLOOD CULTURE RECEIVED NO GROWTH TO DATE CULTURE WILL BE HELD FOR 5 DAYS BEFORE ISSUING A FINAL NEGATIVE REPORT Performed at [facility]    Report Status PENDING  Incomplete  Urine culture     Status: None (Preliminary result)   Collection Time: 05/18/14  5:52 AM  Result  Value Ref Range Status   Specimen Description URINE, CLEAN CATCH  Final   Special Requests NONE  Final   Culture  Setup Time   Final    05/18/2014 08:39 Performed at [facility] PENDING  Incomplete   Culture   Final    Culture reincubated for better growth Performed at [facility]    Report Status PENDING  Incomplete    Medical History: Past Medical History  Diagnosis Date  . Hypertension   . COPD (chronic obstructive pulmonary disease)   . GERD (gastroesophageal reflux disease)   . Renal insufficiency   . Hyperlipidemia   . CAD (coronary artery disease)   . B12 deficiency   . Memory loss   . Vertigo   . Diabetes mellitus     type II, poly neuropathy  . Overweight 01/31/2013  . Lung cancer, hilus 03/22/2014    Assessment: 84  YOM with lung cancer and atrial fibrillation readmitted 11/3 with sepsis and hematuria after being discharged on same day. Patient initially started on Vancomycin and Zosyn for sepsis and patient received initial doses of antibiotics before refusing further care.  Today, patient has agreed to resume treatment with antibiotics.  Urine culture collected during recent admission grew Enterocococcus species.  Pharmacy consulted to start Unasyn for UTI and likely sepsis.  11/4 >> Vanc x 1 dose 11/4 >> Zosyn x 2 doses 11/6 >> Unasyn >>  Tmax: 99.5 (103.2 11/3 PM) WBC: wnl Renal: SCr 1.76 (increasing), CrCl~41 ml/min (CG)  Micro: 11/2 urine: Enterococcus species (pan-sensitive) 11/3 blood x 2: ngtd 11/3 urine: culture reincubating  Goal of Therapy:  Doses adjusted per renal function Eradication of infection  Plan:  1.  Unasyn 3g IV q6h. 2.  F/u SCr, clinical course.  Hershal Coria 05/20/2014,7:59 AM

## 2014-05-20 NOTE — Plan of Care (Signed)
Problem: Phase II Progression Outcomes Goal: IV changed to normal saline lock Outcome: Completed/Met Date Met:  05/20/14     

## 2014-05-20 NOTE — Progress Notes (Signed)
[facility] PROGRESS NOTE  Gregory Farrell GGE:[id number] DOB: 07/06/1933 DOA: 05/17/2014 PCP: [name], MD  Assessment/Plan: 1-Sepsis: secondary to enterococcus UTI. Initially on IV vancomycin and Zosyn.  -following urine cx's and patient response to tx, antibiotics narrow to unasyn  -afebrile and WBC's back to normal limit now  2-Hematuria: in setting of eliquis and infection, urine clear now. Treat for infection. Daughter decline urology consultation.  -no further major hematuria appreciated -will monitor  3-Paroxysmal A fib; continue with amiodarone. Heparin per pharmacy. Cardiology following. -rate controlled.   4-DVT of right subclavian vein and axillary vein; daughter does not want eliquis. Discussed with Dr Marin Olp and for now plan is for Heparin Gtt.  -will follow rec's for long term anticoagulation  5-post radiation skin burn; continue local care.   6-esophagitis post radiation therapy: on PPI and Carafate. -appetite and swallowing w/o difficulties   7-Acute on chronic renal failure. Cr per last records 1.6 to 1.7.  -most likely due to UTI and dehydration  -improving -continue abx's for UTI -avoid nephrotoxic agents -maintain good hydration  8-Squamous Cell Lung CA. Dr Marin Olp following. No plans for further chemotherapy or other intervention at this point for lung cancer. Will follow rec's.  9-physical deconditioning and weakness: per PT rec's will require SNF for rehab  Code Status: DNR Family Communication: Care discussed with patient.  Disposition Plan: Remains inpatient and on telemetry; plan is for SNF at discharge   Consultants:  Cardiology (Dr. Ron Parker)  Dr Marin Olp.   Palliative Care  Procedures:  none  Antibiotics:  Zosyn 11-04>>11/5  Vancomycin 11-04>>11/5  Unasyn 11/5  HPI/Subjective: No fever; denies SOB or CP. No palpitations. Reports appetite is ok.    Objective: Filed Vitals:   05/20/14 2024  BP: 120/54  Pulse: 69  Temp:  98.7 F (37.1 C)  Resp: 16    Intake/Output Summary (Last 24 hours) at 05/20/14 2129 Last data filed at 05/20/14 2025  Gross per 24 hour  Intake    360 ml  Output    800 ml  Net   -440 ml   Filed Weights   05/18/14 0049  Weight: 87.9 kg (193 lb 12.6 oz)    Exam:   General:  Alert and oriented; flat affect; afebrile and just feeling weak/tired, answer questions appropriately, follow commands and expressed desire to follow Dr. Marin Olp rec's and try to get better. Not longer refusing treatment.   Cardiovascular: S 1, S 2 RRR  Respiratory: no wheezing, no ronchus.   Abdomen: BS present, soft, NT  Musculoskeletal: right arm slightly swollen; but much better base on nursing staff description.  Data Reviewed: Basic Metabolic Panel:  Recent Labs Lab 05/17/14 0635 05/17/14 2138 05/18/14 0430 05/19/14 0135 05/20/14 0849  NA 138 134* 136* 135* 138  K 4.0 4.1 4.2 3.9 4.3  CL 101 97 100 100 101  CO2 26 24 25 26 26   GLUCOSE 129* 154* 166* 101* 104*  BUN 16 16 17 21 16   CREATININE 1.64* 1.59* 1.69* 1.76* 1.60*  CALCIUM 8.1* 8.2* 7.7* 8.0* 8.3*   Liver Function Tests:  Recent Labs Lab 05/17/14 2138 05/18/14 0430 05/19/14 0135 05/20/14 0849  AST 20 15 16 19   ALT 14 12 11 15   ALKPHOS 99 90 101 84  BILITOT 0.6 0.7 0.4 0.4  PROT 5.5* 4.8* 5.0* 5.2*  ALBUMIN 2.1* 1.7* 1.8* 1.8*   CBC:  Recent Labs Lab 05/16/14 0455 05/17/14 0635 05/17/14 2138 05/18/14 0430 05/19/14 0135 05/20/14 0849  WBC 4.8 4.6  6.8 11.0* 8.6 6.1  NEUTROABS 2.9 2.7 6.3 9.0* 7.1  --   HGB 10.3* 10.5* 11.4* 9.9* 9.8* 10.2*  HCT 32.7* 32.4* 35.5* 30.9* 30.5* 32.5*  MCV 97.6 97.3 95.4 97.5 96.5 97.9  PLT 99* 74* 71* 65* 69* 62*   BNP (last 3 results)  Recent Labs  05/18/14 0430  PROBNP 3964.0*   CBG:  Recent Labs Lab 05/18/14 2011 05/19/14 0053 05/19/14 0524 05/19/14 0748 05/19/14 1158  GLUCAP 167* 101* 98 90 154*    Recent Results (from the past 240 hour(s))  Urine culture      Status: None   Collection Time: 05/16/14  8:43 AM  Result Value Ref Range Status   Specimen Description URINE, CLEAN CATCH  Final   Special Requests NONE  Final   Culture  Setup Time   Final    05/16/2014 17:23 Performed at [place]   Final    >=100,000 COLONIES/ML Performed at [facility]    Culture   Final    ENTEROCOCCUS SPECIES Performed at [facility]    Report Status 05/18/2014 FINAL  Final   Organism ID, Bacteria ENTEROCOCCUS SPECIES  Final      Susceptibility   Enterococcus species - MIC*    AMPICILLIN <=2 SENSITIVE Sensitive     LEVOFLOXACIN 1 SENSITIVE Sensitive     NITROFURANTOIN <=16 SENSITIVE Sensitive     VANCOMYCIN 1 SENSITIVE Sensitive     TETRACYCLINE <=1 SENSITIVE Sensitive     * ENTEROCOCCUS SPECIES  Culture, blood (routine x 2)     Status: None (Preliminary result)   Collection Time: 05/17/14  9:38 PM  Result Value Ref Range Status   Specimen Description BLOOD LEFT ANTECUBITAL  Final   Special Requests BOTTLES DRAWN AEROBIC AND ANAEROBIC 5CC AND 3CC  Final   Culture  Setup Time   Final    05/18/2014 01:06 Performed at [facility]    Culture   Final           BLOOD CULTURE RECEIVED NO GROWTH TO DATE CULTURE WILL BE HELD FOR 5 DAYS BEFORE ISSUING A FINAL NEGATIVE REPORT Performed at [facility]    Report Status PENDING  Incomplete  Culture, blood (routine x 2)     Status: None (Preliminary result)   Collection Time: 05/17/14 10:57 PM  Result Value Ref Range Status   Specimen Description BLOOD PORTA CATH  Final   Special Requests BOTTLES DRAWN AEROBIC AND ANAEROBIC 3CC  Final   Culture  Setup Time   Final    05/18/2014 01:07 Performed at [facility]    Culture   Final           BLOOD CULTURE RECEIVED NO GROWTH TO DATE CULTURE WILL BE HELD FOR 5 DAYS BEFORE ISSUING A FINAL NEGATIVE REPORT Performed at [facility]    Report Status PENDING  Incomplete  Urine  culture     Status: None (Preliminary result)   Collection Time: 05/18/14  5:52 AM  Result Value Ref Range Status   Specimen Description URINE, CLEAN CATCH  Final   Special Requests NONE  Final   Culture  Setup Time   Final    05/18/2014 08:39 Performed at [place]   Final    85,000 COLONIES/ML Performed at [place]   Final    [place] Performed at [facility]  Report Status PENDING  Incomplete     Studies: No results found.  Scheduled Meds: . amiodarone  400 mg Oral BID  . ampicillin-sulbactam (UNASYN) IV  3 g Intravenous Q6H  . citalopram  20 mg Oral Daily  . lactulose  10 g Oral BID  . zolpidem  5 mg Oral QHS   Continuous Infusions: . sodium chloride 50 mL/hr at 05/18/14 1131  . sodium chloride 10 mL/hr at 05/18/14 0049  . heparin 550 Units/hr (05/20/14 1014)    Active Problems:   Lung cancer, hilus   Paroxysmal atrial fibrillation   Hematuria   Sepsis   Fever   Palliative care encounter   Weakness generalized    Time spent: 30 minutes.     Barton Dubois  [facility] Pager [phone]. If 7PM-7AM, please contact night-coverage at [email or web address], password [email or web address] 05/20/2014, 9:29 PM  LOS: 3 days

## 2014-05-20 NOTE — Progress Notes (Signed)
I had a very long talk with Mr. Vitali last night and this morning. I understand that he wanted to stop everything. He just wanted to die.  When I saw him last night, he was eating dinner. He was having no problems swallowing. There is no dysphagia or odynophagia.  I explained to him that he did not have a terminal illness. I told him that what was going on right now was the product of an infection. I think that with therapy, he will get better.  He is just very despondent that he is sick and that he does not think that he will get better. I tried to reassure him that he would get better. It would certainly take physical therapy.  His right arm looks better. The swelling has gone away. This encouraged him area and I told him that the blood clot in his right arm was improving.  His age fibrillation has not really been a problem. Off the amiodarone, he still is in normal sinus rhythm.  He has decided to try to get better. He will let us administer some medications. I told him that we would just give him what he needed right now. I think that the heparin drip would be helpful. I also think the IV antibiotics would be helpful.  He is tired of taking pills. I will try to limit his pill intake. Ambien does seem to help him rest.  I think that if he can just see that he has the ability to improve, I think he will try better.  Currently, his vital signs are stable. His blood pressure is 118/57. His pulse is 65. He is afebrile. His lungs sound pretty clear. He has an occasional wheeze. Cardiac exam regular rate and rhythm. Abdomen is soft. He has decent bowel sounds. Extremities shows his right arm to be without swelling. He has a wrap around the right forearm. Neurological exam is nonfocal.  Again, he will try to get better. He will agree to medications. We have to be very cautious and not get overly aggressive with pills. I would not get him on anything for the control fibrillation. I think that if he  goes into rapid atrial fibrillation, then we would just look at comfort care.  I think that if we get him out of the bed into a chair today, this will help his outlook.  Physical therapy has helped before. I think that it will help again.  The fact that he has no hematuria I think is encouraging to him.  I spent over an hour with him. I try to get him to understand that he will get better but that it will take some time. He knows that he is not to get any more treatment for the lung cancer. He knows that his lung cancer is not getting worse right now. Again he does not have a terminal illness.  I will continue to pray for him. I will talk to his daughter today.  I very much appreciate all the great help that he is getting from the nurses on [address]. I also appreciate the efforts that his other doctors have made.  Frederich Cha 1:5-7

## 2014-05-20 NOTE — Progress Notes (Signed)
Patient ID: Gregory Farrell, male   DOB: 1932/08/18, 80 y.o.   MRN: [id number]    SUBJECTIVE:  The patient is not having any chest pain. The monitor does show that he had some rapid atrial fibrillation yesterday. He is converted back to sinus rhythm. He had gone home on amiodarone. I think it has not yet been restarted here and I have just written the orders.   Filed Vitals:   05/19/14 1151 05/19/14 2031 05/19/14 2040 05/20/14 0646  BP: 114/72  103/65 118/57  Pulse: 88  85 65  Temp: 98.9 F (37.2 C)  98.3 F (36.8 C) 98.5 F (36.9 C)  TempSrc: Oral  Oral Oral  Resp: 18  15 16   Height:      Weight:      SpO2: 95% 93% 97% 95%     Intake/Output Summary (Last 24 hours) at 05/20/14 0819 Last data filed at 05/19/14 2047  Gross per 24 hour  Intake      0 ml  Output    400 ml  Net   -400 ml    LABS: Basic Metabolic Panel:  Recent Labs  05/18/14 0430 05/19/14 0135  NA 136* 135*  K 4.2 3.9  CL 100 100  CO2 25 26  GLUCOSE 166* 101*  BUN 17 21  CREATININE 1.69* 1.76*  CALCIUM 7.7* 8.0*   Liver Function Tests:  Recent Labs  05/18/14 0430 05/19/14 0135  AST 15 16  ALT 12 11  ALKPHOS 90 101  BILITOT 0.7 0.4  PROT 4.8* 5.0*  ALBUMIN 1.7* 1.8*   No results for input(s): LIPASE, AMYLASE in the last 72 hours. CBC:  Recent Labs  05/18/14 0430 05/19/14 0135  WBC 11.0* 8.6  NEUTROABS 9.0* 7.1  HGB 9.9* 9.8*  HCT 30.9* 30.5*  MCV 97.5 96.5  PLT 65* 69*   Cardiac Enzymes: No results for input(s): CKTOTAL, CKMB, CKMBINDEX, TROPONINI in the last 72 hours. BNP: Invalid input(s): POCBNP D-Dimer: No results for input(s): DDIMER in the last 72 hours. Hemoglobin A1C:  Recent Labs  05/18/14 0430  HGBA1C 7.1*   Fasting Lipid Panel: No results for input(s): CHOL, HDL, LDLCALC, TRIG, CHOLHDL, LDLDIRECT in the last 72 hours. Thyroid Function Tests: No results for input(s): TSH, T4TOTAL, T3FREE, THYROIDAB in the last 72 hours.  Invalid input(s): FREET3  RADIOLOGY: Dg  Chest 2 View  05/17/2014   CLINICAL DATA:  Hematuria.  Nose bleed.  EXAM: CHEST  2 VIEW  COMPARISON:  05/07/2014.  FINDINGS: RIGHT IJ power port. Cardiopericardial silhouette is mildly enlarged. Small bilateral pleural effusions are present, right-greater-than-left. Associated atelectasis. No focal airspace consolidation although difficult to exclude based on basilar atelectasis.Aortic arch atherosclerosis. Probable coronary artery stents.  IMPRESSION: Small bilateral pleural effusions and mild cardiomegaly.   Electronically Signed   By: Dereck Ligas M.D.   On: 05/17/2014 22:35   Ct Angio Chest Pe W/cm &/or Wo Cm  05/07/2014   CLINICAL DATA:  History of stage IIIB squamous cell lung cancer, undergoing chemotherapy and radiation. Generalized weakness and lightheadedness. Shortness of breath, tachycardia. Evaluate for pulmonary embolus.  EXAM: CT ANGIOGRAPHY CHEST WITH CONTRAST  TECHNIQUE: Multidetector CT imaging of the chest was performed using the standard protocol during bolus administration of intravenous contrast. Multiplanar CT image reconstructions and MIPs were obtained to evaluate the vascular anatomy.  CONTRAST:  44mL OMNIPAQUE IOHEXOL 350 MG/ML SOLN  COMPARISON:  02/23/2014  FINDINGS: No filling defects in the pulmonary arteries to suggest pulmonary emboli. Stable moderate-sized hiatal  hernia. Heart is normal size. Aorta is normal caliber. Densely calcified coronary arteries. Moderate calcifications throughout the aorta which is mildly tortuous.  No mediastinal, hilar, or axillary adenopathy. Stable small left hilar lymph node measuring 9 mm in short axis diameter on image 43. Chest wall soft tissues are unremarkable.  Significant improvement in left lower lobe atelectasis/collapse since prior study. The central left hilar/infrahilar mass much improved and difficult to measure. This soft tissue surrounds the left lower lobe bronchus on image 50, but again is improved.  Right basilar atelectasis.  Small subpleural nodule in the left upper lobe measures 3 mm on image 41, stable. No new or enlarging pulmonary nodules. Trace bilateral pleural effusions. No pericardial effusion.  Imaging into the upper abdomen shows no acute findings.  No acute bony abnormality or focal bone lesion. Changes of ankylosing spondylitis again noted within the thoracic spine.  Review of the MIP images confirms the above findings.  IMPRESSION: Improving left hilar/ infrahilar obstructing mass. Mild abnormal soft tissue continues does surround the left lower lobe bronchus, but there has improved aeration with decreasing atelectasis in the left lower lung. Stable small/borderline size left hilar lymph node.  Stable posterior subpleural nodule in the left upper lobe, 3 mm.  Right basilar atelectasis.  Severe coronary artery disease.  Stable hiatal hernia.   Electronically Signed   By: Rolm Baptise M.D.   On: 05/07/2014 23:48   Dg Chest Port 1 View  05/07/2014   CLINICAL DATA:  Patient complaining of shortness of breath today. History of COPD. History of lung carcinoma, coronary artery disease and hypertension. Ex-smoker.  EXAM: PORTABLE CHEST - 1 VIEW  COMPARISON:  10/07/2012.  FINDINGS: Cardiac silhouette is mildly enlarged. No mediastinal or hilar masses.  No lung consolidation or edema.  No mass or discrete nodule.  Opacity superimpose are cardiac silhouette may be due to the aorta or a small hiatal hernia.  No convincing pleural effusion.  No pneumothorax.  Right anterior chest wall power Port-A-Cath has its tip in the lower superior vena cava.  IMPRESSION: No acute cardiopulmonary disease.   Electronically Signed   By: Lajean Manes M.D.   On: 05/07/2014 11:11    PHYSICAL EXAM  Patient is oriented to person time and place. He does appear to be depressed. Head is atraumatic. Lungs really a few scattered rhonchi. Heart sounds are distant on physical exam. There is trace peripheral edema.   TELEMETRY:  I have reviewed  telemetry today May 20, 2014. He did have rapid atrial fib for a period of time yesterday. He then converted. Currently he is in normal sinus rhythm.   ASSESSMENT AND PLAN:    Lung cancer, hilus     Dr. Marin Olp is communicating carefully with the patient about his overall status.    Paroxysmal atrial fibrillation     When the patient was here earlier this week he was on amiodarone. He was to go home on 400 mg orally twice a day for 2 weeks. Here in the hospital this was not restarted. I have written for amiodarone today. Plan to continue this for now. He did have a burst of rapid atrial fibrillation yesterday. He converted spontaneously to sinus rhythm. If he can tolerate amiodarone, it will be appropriate to continue.      Palliative care encounter        Dola Argyle 05/20/2014 8:19 AM

## 2014-05-20 NOTE — Progress Notes (Signed)
[facility] for heparin Indication: DVT treatment of RUE/afib  Allergies  Allergen Reactions  . Exenatide     REACTION: nausea    Patient Measurements: Height: 5\' 11"  (180.3 cm) Weight: 193 lb 12.6 oz (87.9 kg) IBW/kg (Calculated) : 75.3  Vital Signs: Temp: 98.5 F (36.9 C) (11/06 0646) Temp Source: Oral (11/06 0646) BP: 118/57 mmHg (11/06 0646) Pulse Rate: 65 (11/06 0646)  Labs:  Recent Labs  05/17/14 2138 05/18/14 0430 05/19/14 0135 05/19/14 0352  HGB 11.4* 9.9* 9.8*  --   HCT 35.5* 30.9* 30.5*  --   PLT 71* 65* 69*  --   APTT  --   --   --  172*  HEPARINUNFRC  --   --   --  >2.20*  CREATININE 1.59* 1.69* 1.76*  --     Assessment: 27 YOM with lung cancer and atrial fibrillation readmitted 11/3 with sepsis and hematuria after being discharged on same day.  Patient was started on anticoagulation during previous admission for new RUE DVT and was discharged on apixaban.  Apixaban has now been held and pharmacy consulted to start heparin infusion for continued treatment of recent DVT.  Significant events:  10/28: duplex of RUE reveals DVT (has implanted port in R chest wall), pharmacy asked to dose heparin gtt.  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  GI recommends continuing supportive care.  Started apixaban.  Apixaban education completed with patient, spouse, and daughter. 11/3: Discharged on apixaban (was on day #5 of therapy).  Re-admitted later in day with hematuria, epistaxis, fever.  Last dose of apixaban was a 10 mg dose given at 10:30am 11/3. 11/5 AM: Heparin level >2.20 and aPTT 172 with infusion at 850 units/hr (pt was previously therapeutic at this rate).  Drip held x 30 minutes and resumed at 550 units/hr.   11/5 PM: Heparin and all other medications other than for comfort were discontinued following palliative care meeting 11/6: Patient agreed to resume care to treat DVT and infection to try to get better after  discussion with oncology.  Resuming heparin drip this AM.  No bleeding since initial presentation at re-admission.  Today, 11/6  SCr 1.76, increased.  CrCl~41 ml/min.    CBC: Hgb and platelets low.   Night RN reports urine clear during her shift.    Goal of Therapy:  VTE treatment Heparin level 0.3-0.7 units/ml Monitor platelets by anticoagulation protocol: Yes  Plan:  1.  Start heparin infusion at low rate of 550 units/hr.  No bolus due to recent high level.  Possible that patient is still clearing apixaban due to renal insufficiency.  2.  Check heparin level in 8 hours. 3.  Daily heparin level and CBC while on heparin. 4.  F/u for any reports of bleeding.  Hershal Coria, PharmD, BCPS Pager: [phone] 05/20/2014 7:47 AM

## 2014-05-20 NOTE — Progress Notes (Signed)
  Amiodarone Drug - Drug Interaction Consult Note  Recommendations:  Amiodarone is metabolized by the cytochrome P450 system and therefore has the potential to cause many drug interactions. Amiodarone has an average plasma half-life of 50 days (range 20 to 100 days).   There is potential for drug interactions to occur several weeks or months after stopping treatment and the onset of drug interactions may be slow after initiating amiodarone.   []  Statins: Increased risk of myopathy. Simvastatin- restrict dose to 20mg  daily. Other statins: counsel patients to report any muscle pain or weakness immediately.  []  Anticoagulants: Amiodarone can increase anticoagulant effect. Consider warfarin dose reduction. Patients should be monitored closely and the dose of anticoagulant altered accordingly, remembering that amiodarone levels take several weeks to stabilize.  []  Antiepileptics: Amiodarone can increase plasma concentration of phenytoin, the dose should be reduced. Note that small changes in phenytoin dose can result in large changes in levels. Monitor patient and counsel on signs of toxicity.  []  Beta blockers: increased risk of bradycardia, AV block and myocardial depression. Sotalol - avoid concomitant use.  []   Calcium channel blockers (diltiazem and verapamil): increased risk of bradycardia, AV block and myocardial depression.  []   Cyclosporine: Amiodarone increases levels of cyclosporine. Reduced dose of cyclosporine is recommended.  []  Digoxin dose should be halved when amiodarone is started.  []  Diuretics: increased risk of cardiotoxicity if hypokalemia occurs.  []  Oral hypoglycemic agents (glyburide, glipizide, glimepiride): increased risk of hypoglycemia. Patient's glucose levels should be monitored closely when initiating amiodarone therapy.   [x]  Drugs that prolong the QT interval:  Torsades de pointes risk may be increased with concurrent use - avoid if possible.  Monitor QTc, also  keep magnesium/potassium WNL if concurrent therapy can't be avoided. Marland Kitchen Antibiotics: e.g. fluoroquinolones, erythromycin. . Antiarrhythmics: e.g. quinidine, procainamide, disopyramide, sotalol. . Antipsychotics: e.g. phenothiazines, haloperidol.  . Lithium, tricyclic antidepressants, and methadone. Thank You,  Lorenso Courier [facility]  05/20/2014 10:30 AM

## 2014-05-20 NOTE — Plan of Care (Signed)
Problem: Phase II Progression Outcomes Goal: Vital signs remain stable Outcome: Completed/Met Date Met:  05/20/14     

## 2014-05-21 DIAGNOSIS — N183 Chronic kidney disease, stage 3 unspecified: Secondary | ICD-10-CM | POA: Insufficient documentation

## 2014-05-21 DIAGNOSIS — N179 Acute kidney failure, unspecified: Secondary | ICD-10-CM | POA: Insufficient documentation

## 2014-05-21 DIAGNOSIS — C3401 Malignant neoplasm of right main bronchus: Secondary | ICD-10-CM | POA: Insufficient documentation

## 2014-05-21 LAB — APTT
aPTT: 45 seconds — ABNORMAL HIGH (ref 24–37)
aPTT: 45 seconds — ABNORMAL HIGH (ref 24–37)

## 2014-05-21 LAB — CBC
HCT: 32.1 % — ABNORMAL LOW (ref 39.0–52.0)
HEMOGLOBIN: 10.2 g/dL — AB (ref 13.0–17.0)
MCH: 31.1 pg (ref 26.0–34.0)
MCHC: 31.8 g/dL (ref 30.0–36.0)
MCV: 97.9 fL (ref 78.0–100.0)
Platelets: 59 10*3/uL — ABNORMAL LOW (ref 150–400)
RBC: 3.28 MIL/uL — ABNORMAL LOW (ref 4.22–5.81)
RDW: 19.9 % — ABNORMAL HIGH (ref 11.5–15.5)
WBC: 5.7 10*3/uL (ref 4.0–10.5)

## 2014-05-21 LAB — URINE CULTURE

## 2014-05-21 LAB — HEPARIN LEVEL (UNFRACTIONATED)
HEPARIN UNFRACTIONATED: 0.84 [IU]/mL — AB (ref 0.30–0.70)
Heparin Unfractionated: 1.28 IU/mL — ABNORMAL HIGH (ref 0.30–0.70)

## 2014-05-21 LAB — GLUCOSE, CAPILLARY: GLUCOSE-CAPILLARY: 130 mg/dL — AB (ref 70–99)

## 2014-05-21 MED ORDER — PANTOPRAZOLE SODIUM 40 MG PO TBEC
40.0000 mg | DELAYED_RELEASE_TABLET | Freq: Two times a day (BID) | ORAL | Status: DC
  Administered 2014-05-21 – 2014-05-23 (×5): 40 mg via ORAL
  Filled 2014-05-21 (×5): qty 1

## 2014-05-21 MED ORDER — BUDESONIDE-FORMOTEROL FUMARATE 160-4.5 MCG/ACT IN AERO
2.0000 | INHALATION_SPRAY | Freq: Two times a day (BID) | RESPIRATORY_TRACT | Status: DC
  Administered 2014-05-21 – 2014-05-23 (×4): 2 via RESPIRATORY_TRACT
  Filled 2014-05-21: qty 6

## 2014-05-21 MED ORDER — INSULIN ASPART 100 UNIT/ML ~~LOC~~ SOLN
0.0000 [IU] | Freq: Three times a day (TID) | SUBCUTANEOUS | Status: DC
  Administered 2014-05-21: 1 [IU] via SUBCUTANEOUS
  Administered 2014-05-22: 2 [IU] via SUBCUTANEOUS
  Administered 2014-05-22: 1 [IU] via SUBCUTANEOUS
  Administered 2014-05-23 (×2): 2 [IU] via SUBCUTANEOUS

## 2014-05-21 MED ORDER — GLUCERNA PO LIQD
237.0000 mL | Freq: Two times a day (BID) | ORAL | Status: DC
Start: 2014-05-21 — End: 2014-05-21

## 2014-05-21 MED ORDER — GLUCERNA SHAKE PO LIQD
237.0000 mL | Freq: Two times a day (BID) | ORAL | Status: DC
  Administered 2014-05-21 – 2014-05-22 (×2): 237 mL via ORAL
  Filled 2014-05-21 (×5): qty 237

## 2014-05-21 NOTE — Progress Notes (Signed)
Received report from [place] and agree with assessment.

## 2014-05-21 NOTE — Progress Notes (Addendum)
Subjective:  No complaints of shortness of breath or chest pain  Objective:  Vital Signs in the last 24 hours: BP 118/45 mmHg  Pulse 70  Temp(Src) 98.8 F (37.1 C) (Oral)  Resp 16  Ht 5\' 11"  (1.803 m)  Wt 87.9 kg (193 lb 12.6 oz)  BMI 27.04 kg/m2  SpO2 96%  Physical Exam: Pleasant elderly male in no acute distress Skin: Multiple ecchymoses noted Lungs:  Clear  Cardiac:  Regular rhythm, normal S1 and S2, no S3 Abdomen:  Soft, nontender, no masses Extremities:  No edema present  Intake/Output from previous day: 11/06 0701 - 11/07 0700 In: 631 [P.O.:360; I.V.:71; IV Piggyback:200] Out: 925 [Urine:925] Weight Filed Weights   05/18/14 0049  Weight: 87.9 kg (193 lb 12.6 oz)    Lab Results: Basic Metabolic Panel:  Recent Labs  05/19/14 0135 05/20/14 0849  NA 135* 138  K 3.9 4.3  CL 100 101  CO2 26 26  GLUCOSE 101* 104*  BUN 21 16  CREATININE 1.76* 1.60*    CBC:  Recent Labs  05/19/14 0135 05/20/14 0849  WBC 8.6 6.1  NEUTROABS 7.1  --   HGB 9.8* 10.2*  HCT 30.5* 32.5*  MCV 96.5 97.9  PLT 69* 62*    BNP    Component Value Date/Time   PROBNP 3964.0* 05/18/2014 0430    PROTIME: Lab Results  Component Value Date   INR 1.16 05/11/2014   INR 1.01 03/30/2014   INR 0.97 03/01/2014    Telemetry: Currently sinus rhythm. One episode of atrial fibrillation noted today.   Assessment/Plan:  1. Paroxysmal atrial fibrillation-still having some PACs and atrial fibrillation but suspect as amiodarone loads that this will diminish 2. Lung cancer per Dr. Marin Olp  Recommendations:  Continue amiodarone loading.     Kerry Hough  MD [facility]  05/21/2014, 9:09 AM
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
## 2014-05-21 NOTE — Progress Notes (Signed)
[place] for heparin Indication: DVT treatment of RUE/afib  Allergies  Allergen Reactions  . Exenatide     REACTION: nausea    Patient Measurements: Height: 5\' 11"  (180.3 cm) Weight: 193 lb 12.6 oz (87.9 kg) IBW/kg (Calculated) : 75.3  Vital Signs: Temp: 98.5 F (36.9 C) (11/07 1500) Temp Source: Oral (11/07 1500) BP: 110/52 mmHg (11/07 1500) Pulse Rate: 72 (11/07 1500)  Labs:  Recent Labs  05/19/14 0135  05/20/14 0849  05/20/14 2053 05/21/14 0859 05/21/14 2025  HGB 9.8*  --  10.2*  --   --  10.2*  --   HCT 30.5*  --  32.5*  --   --  32.1*  --   PLT 69*  --  62*  --   --  59*  --   APTT  --   < >  --   < > 46* 45* 45*  HEPARINUNFRC  --   < >  --   < > >2.20* 1.28* 0.84*  CREATININE 1.76*  --  1.60*  --   --   --   --   < > = values in this interval not displayed.  Assessment: 33 YOM with lung cancer and atrial fibrillation readmitted 11/3 with sepsis and hematuria after being discharged on same day.  Patient was started on anticoagulation during previous admission for new RUE DVT and was discharged on apixaban.  Apixaban has now been held and pharmacy consulted to start heparin infusion for continued treatment of recent DVT.  Significant events:  10/28: duplex of RUE reveals DVT (has implanted port in R chest wall), pharmacy asked to dose heparin gtt.  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  GI recommends continuing supportive care.  Started apixaban.  Apixaban education completed with patient, spouse, and daughter. 11/3: Discharged on apixaban (was on day #5 of therapy).  Re-admitted later in day with hematuria, epistaxis, fever.  Last dose of apixaban was a 10 mg dose given at 10:30am 11/3. 11/5 AM: Heparin level >2.20 and aPTT 172 with infusion at 850 units/hr (pt was previously therapeutic at this rate).  Drip held x 30 minutes and resumed at 550 units/hr.   11/5 PM: Heparin and all other medications other than for comfort  were discontinued following palliative care meeting 11/6: Patient agreed to resume care to treat DVT and infection to try to get better after discussion with oncology.  Resuming heparin drip this AM.  No bleeding since initial presentation at re-admission.  Today, 11/7  SCr 1.6, decreased.  CrCl~40 ml/min.    CBC: Hgb and platelets low.   Per RN no reports of bleeding  HL 084 aPTT 45  Difficult to interpret disparate coagulation results, assuming 2053 level on 11/6 was in error  Goal of Therapy:  VTE treatment Heparin level 0.3-0.7 units/ml Monitor platelets by anticoagulation protocol: Yes  Plan:  1.  Increase heparin infusion rate of 750 units/hr.  Possible that patient is still clearing apixaban due to renal insufficiency.  2.  Check heparin level in 8 hours. 3.  Daily heparin level and CBC while on heparin. 4.  F/u for any reports of bleeding.  Thank you for the consult.  Currie Paris, PharmD, BCPS Pager: [phone] Pharmacy: [phone] 05/21/2014 8:59 PM

## 2014-05-21 NOTE — Progress Notes (Signed)
[place] for heparin Indication: DVT treatment of RUE/afib  Allergies  Allergen Reactions  . Exenatide     REACTION: nausea    Patient Measurements: Height: 5\' 11"  (180.3 cm) Weight: 193 lb 12.6 oz (87.9 kg) IBW/kg (Calculated) : 75.3  Vital Signs: Temp: 98.8 F (37.1 C) (11/07 0627) Temp Source: Oral (11/07 0627) BP: 118/45 mmHg (11/07 0627) Pulse Rate: 70 (11/07 0627)  Labs:  Recent Labs  05/19/14 0135  05/20/14 0849 05/20/14 1556 05/20/14 1800 05/20/14 2053 05/21/14 0859  HGB 9.8*  --  10.2*  --   --   --  10.2*  HCT 30.5*  --  32.5*  --   --   --  32.1*  PLT 69*  --  62*  --   --   --  59*  APTT  --   < >  --   --  49* 46* 45*  HEPARINUNFRC  --   < >  --  1.50*  --  >2.20* 1.28*  CREATININE 1.76*  --  1.60*  --   --   --   --   < > = values in this interval not displayed.  Assessment: 8 YOM with lung cancer and atrial fibrillation readmitted 11/3 with sepsis and hematuria after being discharged on same day.  Patient was started on anticoagulation during previous admission for new RUE DVT and was discharged on apixaban.  Apixaban has now been held and pharmacy consulted to start heparin infusion for continued treatment of recent DVT.  Significant events:  10/28: duplex of RUE reveals DVT (has implanted port in R chest wall), pharmacy asked to dose heparin gtt.  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  GI recommends continuing supportive care.  Started apixaban.  Apixaban education completed with patient, spouse, and daughter. 11/3: Discharged on apixaban (was on day #5 of therapy).  Re-admitted later in day with hematuria, epistaxis, fever.  Last dose of apixaban was a 10 mg dose given at 10:30am 11/3. 11/5 AM: Heparin level >2.20 and aPTT 172 with infusion at 850 units/hr (pt was previously therapeutic at this rate).  Drip held x 30 minutes and resumed at 550 units/hr.   11/5 PM: Heparin and all other medications other than  for comfort were discontinued following palliative care meeting 11/6: Patient agreed to resume care to treat DVT and infection to try to get better after discussion with oncology.  Resuming heparin drip this AM.  No bleeding since initial presentation at re-admission.  Today, 11/7  SCr 1.6, decreased.  CrCl~40 ml/min.    CBC: Hgb and platelets low.   Per RN no reports of bleeding  HL 1.28 aPTT 45  Difficult to interpret disparate coagulation results, assuming 2053 level on 11/6 was in error  Goal of Therapy:  VTE treatment Heparin level 0.3-0.7 units/ml Monitor platelets by anticoagulation protocol: Yes  Plan:  1.  Increase heparin infusion rate of 650 units/hr.  Possible that patient is still clearing apixaban due to renal insufficiency.  2.  Check heparin level in 8 hours. 3.  Daily heparin level and CBC while on heparin. 4.  F/u for any reports of bleeding.

## 2014-05-21 NOTE — Plan of Care (Signed)
Problem: Phase II Progression Outcomes Goal: Progress activity as tolerated unless otherwise ordered Outcome: Completed/Met Date Met:  05/21/14 Goal: Discharge plan established Outcome: Completed/Met Date Met:  05/21/14

## 2014-05-21 NOTE — Progress Notes (Signed)
Gregory Farrell is doing okay this morning. When I saw him last night, he was eating dinner. I much or how much she actually ate but at least he had some caloric intake.  Physical therapy did come by to try to help him. I appreciate all that they can try to do for him.  He's not having any problems with bleeding. He's on heparin. The right arm looks okay. He has a gauze wrap around the forearm.  He has no odynophagia or dysphagia.  There is no obvious shortness of breath.  His urine is growing gram-positive cocci. I would have to believe that this is enterococcus. He is on Unasyn right now.  His atrial fibrillation does seem to be too bad of a problem right now.  His labs look pretty good. His platelet count is down a little bit. I think this will improve.  On his physical exam, he is afebrile. His pulse is 70. Blood pressure is 118/45.  His oral exam shows no mucositis. There is no thrush. Lungs are clear bilaterally. Cardiac exam regular rate and rhythm with a normal S1 and S2. Abdomen is soft. He has good bowel sounds. Extremities shows no edema in the right arm. He has some trace edema in his legs.  Hopefully, he will continue to take in nutrition. His albumin is only 1.8 area and I think this will be incredibly helpful for him. I think that he can eat anything that he would like.  We will see if the urine culture is growing enterococcus. He is on Unasyn and the enterococcus that was isolated on November 2 was sensitive to ampicillin.  I appreciate all the great help that everybody is getting him.  We will continue to pray hard for him.  [place]

## 2014-05-21 NOTE — Plan of Care (Signed)
Problem: Phase III Progression Outcomes Goal: Pain controlled on oral analgesia Outcome: Completed/Met Date Met:  05/21/14

## 2014-05-22 LAB — CBC
HCT: 31.7 % — ABNORMAL LOW (ref 39.0–52.0)
Hemoglobin: 10 g/dL — ABNORMAL LOW (ref 13.0–17.0)
MCH: 30.8 pg (ref 26.0–34.0)
MCHC: 31.5 g/dL (ref 30.0–36.0)
MCV: 97.5 fL (ref 78.0–100.0)
PLATELETS: 59 10*3/uL — AB (ref 150–400)
RBC: 3.25 MIL/uL — AB (ref 4.22–5.81)
RDW: 19.8 % — ABNORMAL HIGH (ref 11.5–15.5)
WBC: 5.1 10*3/uL (ref 4.0–10.5)

## 2014-05-22 LAB — APTT
APTT: 63 s — AB (ref 24–37)
aPTT: 65 seconds — ABNORMAL HIGH (ref 24–37)

## 2014-05-22 LAB — BASIC METABOLIC PANEL
ANION GAP: 10 (ref 5–15)
BUN: 10 mg/dL (ref 6–23)
CHLORIDE: 105 meq/L (ref 96–112)
CO2: 25 meq/L (ref 19–32)
Calcium: 8.2 mg/dL — ABNORMAL LOW (ref 8.4–10.5)
Creatinine, Ser: 1.4 mg/dL — ABNORMAL HIGH (ref 0.50–1.35)
GFR calc Af Amer: 54 mL/min — ABNORMAL LOW (ref >90)
GFR calc non Af Amer: 46 mL/min — ABNORMAL LOW (ref >90)
Glucose, Bld: 121 mg/dL — ABNORMAL HIGH (ref 70–99)
Potassium: 4 mEq/L (ref 3.7–5.3)
SODIUM: 140 meq/L (ref 137–147)

## 2014-05-22 LAB — HEPARIN LEVEL (UNFRACTIONATED)
Heparin Unfractionated: 0.78 IU/mL — ABNORMAL HIGH (ref 0.30–0.70)
Heparin Unfractionated: 0.61 IU/mL (ref 0.30–0.70)

## 2014-05-22 LAB — GLUCOSE, CAPILLARY
GLUCOSE-CAPILLARY: 151 mg/dL — AB (ref 70–99)
Glucose-Capillary: 109 mg/dL — ABNORMAL HIGH (ref 70–99)
Glucose-Capillary: 123 mg/dL — ABNORMAL HIGH (ref 70–99)

## 2014-05-22 NOTE — Plan of Care (Signed)
Problem: Phase III Progression Outcomes Goal: Activity at appropriate level-compared to baseline (UP IN CHAIR FOR HEMODIALYSIS)  Outcome: Progressing Goal: Discharge plan remains appropriate-arrangements made Outcome: Progressing     
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
## 2014-05-22 NOTE — Progress Notes (Addendum)
Subjective:  No complaints of shortness of breath or chest pain  Objective:  Vital Signs in the last 24 hours: BP 125/70 mmHg  Pulse 68  Temp(Src) 98.7 F (37.1 C) (Oral)  Resp 18  Ht 5\' 11"  (1.803 m)  Wt 87.9 kg (193 lb 12.6 oz)  BMI 27.04 kg/m2  SpO2 95%  Physical Exam: Pleasant elderly male in no acute distress Skin: Multiple ecchymoses noted Lungs:  Clear  Cardiac:  Regular rhythm, normal S1 and S2, no S3 Abdomen:  Soft, nontender, no masses Extremities:  No edema present  Intake/Output from previous day: 11/07 0701 - 11/08 0700 In: 1067.5 [P.O.:360; I.V.:307.5; IV Piggyback:400] Out: 1250 [Urine:1250] Weight Filed Weights   05/18/14 0049  Weight: 87.9 kg (193 lb 12.6 oz)    Lab Results: Basic Metabolic Panel:  Recent Labs  05/20/14 0849 05/22/14 0548  NA 138 140  K 4.3 4.0  CL 101 105  CO2 26 25  GLUCOSE 104* 121*  BUN 16 10  CREATININE 1.60* 1.40*    CBC:  Recent Labs  05/21/14 0859 05/22/14 0548  WBC 5.7 5.1  HGB 10.2* 10.0*  HCT 32.1* 31.7*  MCV 97.9 97.5  PLT 59* 59*    BNP    Component Value Date/Time   PROBNP 3964.0* 05/18/2014 0430    PROTIME: Lab Results  Component Value Date   INR 1.16 05/11/2014   INR 1.01 03/30/2014   INR 0.97 03/01/2014    Telemetry: Currently sinus rhythm. One episode of atrial fibrillation noted today.   Assessment/Plan:  1. Paroxysmal atrial fibrillation-still having some PACs and atrial fibrillation but suspect as amiodarone loads that this will diminish 2. Lung cancer per Dr. Marin Olp  Recommendations:  Continue amiodarone loading. He previously was on Eliquis and I would consider changing him over to Eliquis as he is maintaining sinus rhythm.  ALthough evidently daughter does not want him on Eliquis.  Continue current heparin and sort out in am.     W. Doristine Church  MD [facility]  05/22/2014, 8:49 AM

## 2014-05-22 NOTE — Progress Notes (Signed)
Pharmacy brief anticoagulation note:  1300 heparin level 0.61, aPTT 63  Goal of Therapy:  VTE treatment Heparin level 0.3-0.7 units/ml (aPTT 66-102) Monitor platelets by anticoagulation protocol: Yes  Plan:   Continue heparin infusion rate at 750 units/hr.  Check heparin level and aPTT in am  Daily CBC while on heparin.  F /u for any reports of bleeding   Dolly Rias RPh 05/22/2014, 1:49 PM Pager [phone]

## 2014-05-22 NOTE — Progress Notes (Signed)
[place] for heparin Indication: DVT treatment of RUE/afib  Allergies  Allergen Reactions  . Exenatide     REACTION: nausea    Patient Measurements: Height: 5\' 11"  (180.3 cm) Weight: 193 lb 12.6 oz (87.9 kg) IBW/kg (Calculated) : 75.3  Vital Signs: Temp: 98.7 F (37.1 C) (11/08 0517) Temp Source: Oral (11/08 0517) BP: 125/70 mmHg (11/08 0517) Pulse Rate: 68 (11/08 0517)  Labs:  Recent Labs  05/20/14 0849  05/21/14 0859 05/21/14 2025 05/22/14 0548  HGB 10.2*  --  10.2*  --  10.0*  HCT 32.5*  --  32.1*  --  31.7*  PLT 62*  --  59*  --  59*  APTT  --   < > 45* 45* 65*  HEPARINUNFRC  --   < > 1.28* 0.84* 0.78*  CREATININE 1.60*  --   --   --  1.40*  < > = values in this interval not displayed.  Assessment: 53 YOM with lung cancer and atrial fibrillation readmitted 11/3 with sepsis and hematuria after being discharged on same day.  Patient was started on anticoagulation during previous admission for new RUE DVT and was discharged on apixaban.  Apixaban has now been held and pharmacy consulted to start heparin infusion for continued treatment of recent DVT.  Significant events:  10/28: duplex of RUE reveals DVT (has implanted port in R chest wall), pharmacy asked to dose heparin gtt.  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  GI recommends continuing supportive care.  Started apixaban.  Apixaban education completed with patient, spouse, and daughter. 11/3: Discharged on apixaban (was on day #5 of therapy).  Re-admitted later in day with hematuria, epistaxis, fever.  Last dose of apixaban was a 10 mg dose given at 10:30am 11/3. 11/5 AM: Heparin level >2.20 and aPTT 172 with infusion at 850 units/hr (pt was previously therapeutic at this rate).  Drip held x 30 minutes and resumed at 550 units/hr.   11/5 PM: Heparin and all other medications other than for comfort were discontinued following palliative care meeting 11/6: Patient agreed to  resume care to treat DVT and infection to try to get better after discussion with oncology.  Resuming heparin drip this AM.  No bleeding since initial presentation at re-admission.  Today, 11/7  SCr 1.4, improving.  CrCl~45 ml/min.    CBC: Hgb and platelets low.   Per RN no reports of bleeding  HL 0.78 aPTT 65  Difficult to interpret disparate coagulation results, assuming 2053 level on 11/6 was in error  Goal of Therapy:  VTE treatment Heparin level 0.3-0.7 units/ml   (aPTT 66-102) Monitor platelets by anticoagulation protocol: Yes  Plan:  1.  Continue heparin infusion rate at 750 units/hr.  Possible that patient is still clearing apixaban due to renal insufficiency.  2.  Check heparin level in 8 hours. 3.  Daily heparin level and CBC while on heparin. 4.  F/u for any reports of bleeding.  Thank you for the consult.  Dolly Rias RPh 05/22/2014, 7:19 AM Pager [phone]

## 2014-05-23 ENCOUNTER — Ambulatory Visit: Payer: Medicare HMO

## 2014-05-23 DIAGNOSIS — F329 Major depressive disorder, single episode, unspecified: Secondary | ICD-10-CM | POA: Insufficient documentation

## 2014-05-23 DIAGNOSIS — F32A Depression, unspecified: Secondary | ICD-10-CM | POA: Insufficient documentation

## 2014-05-23 DIAGNOSIS — IMO0001 Reserved for inherently not codable concepts without codable children: Secondary | ICD-10-CM | POA: Insufficient documentation

## 2014-05-23 DIAGNOSIS — R827 Abnormal findings on microbiological examination of urine: Secondary | ICD-10-CM

## 2014-05-23 DIAGNOSIS — G4700 Insomnia, unspecified: Secondary | ICD-10-CM

## 2014-05-23 DIAGNOSIS — C3400 Malignant neoplasm of unspecified main bronchus: Secondary | ICD-10-CM

## 2014-05-23 LAB — CBC
HCT: 29.9 % — ABNORMAL LOW (ref 39.0–52.0)
Hemoglobin: 9.6 g/dL — ABNORMAL LOW (ref 13.0–17.0)
MCH: 31.4 pg (ref 26.0–34.0)
MCHC: 32.1 g/dL (ref 30.0–36.0)
MCV: 97.7 fL (ref 78.0–100.0)
Platelets: 50 10*3/uL — ABNORMAL LOW (ref 150–400)
RBC: 3.06 MIL/uL — ABNORMAL LOW (ref 4.22–5.81)
RDW: 20.1 % — AB (ref 11.5–15.5)
WBC: 4.8 10*3/uL (ref 4.0–10.5)

## 2014-05-23 LAB — GLUCOSE, CAPILLARY
Glucose-Capillary: 105 mg/dL — ABNORMAL HIGH (ref 70–99)
Glucose-Capillary: 155 mg/dL — ABNORMAL HIGH (ref 70–99)
Glucose-Capillary: 153 mg/dL — ABNORMAL HIGH (ref 70–99)

## 2014-05-23 LAB — HEPARIN LEVEL (UNFRACTIONATED): Heparin Unfractionated: 0.51 IU/mL (ref 0.30–0.70)

## 2014-05-23 LAB — APTT: APTT: 71 s — AB (ref 24–37)

## 2014-05-23 MED ORDER — LEVOFLOXACIN 250 MG PO TABS: 250.0000 mg | ORAL_TABLET | Freq: Every day | ORAL | Status: DC

## 2014-05-23 MED ORDER — LACTULOSE 10 GM/15ML PO SOLN: 20.0000 g | Freq: Two times a day (BID) | ORAL | Status: DC | PRN

## 2014-05-23 MED ORDER — RIVAROXABAN 15 MG PO TABS
15.0000 mg | ORAL_TABLET | Freq: Every day | ORAL | Status: DC
  Administered 2014-05-23: 15 mg via ORAL
  Filled 2014-05-23: qty 1

## 2014-05-23 MED ORDER — RIVAROXABAN 15 MG PO TABS: 15.0000 mg | ORAL_TABLET | Freq: Every day | ORAL | Status: DC

## 2014-05-23 MED ORDER — LEVOFLOXACIN 500 MG PO TABS
500.0000 mg | ORAL_TABLET | Freq: Once | ORAL | Status: AC
  Administered 2014-05-23: 500 mg via ORAL
  Filled 2014-05-23: qty 1

## 2014-05-23 MED ORDER — CITALOPRAM HYDROBROMIDE 20 MG PO TABS: 20.0000 mg | ORAL_TABLET | Freq: Every day | ORAL | Status: DC

## 2014-05-23 MED ORDER — HEPARIN SOD (PORK) LOCK FLUSH 100 UNIT/ML IV SOLN
500.0000 [IU] | INTRAVENOUS | Status: AC | PRN
  Administered 2014-05-23: 500 [IU]

## 2014-05-23 MED ORDER — LACTULOSE 10 GM/15ML PO SOLN
10.0000 g | Freq: Two times a day (BID) | ORAL | Status: DC | PRN
  Filled 2014-05-23: qty 15

## 2014-05-23 MED ORDER — AMIODARONE HCL 400 MG PO TABS: 400.0000 mg | ORAL_TABLET | Freq: Every day | ORAL | Status: DC

## 2014-05-23 MED ORDER — LEVOFLOXACIN 250 MG PO TABS: ORAL_TABLET | ORAL | Status: DC

## 2014-05-23 MED ORDER — TEMAZEPAM 7.5 MG PO CAPS: 7.5000 mg | ORAL_CAPSULE | Freq: Every evening | ORAL | Status: DC | PRN

## 2014-05-23 NOTE — Plan of Care (Signed)
Problem: Phase III Progression Outcomes Goal: Voiding independently Outcome: Completed/Met Date Met:  05/23/14 Goal: Foley discontinued Outcome: Not Applicable Date Met:  05/23/14     
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
## 2014-05-23 NOTE — Progress Notes (Signed)
Patient is set to discharge to [facility] SNF today. Patient & daughter, Lovey Newcomer aware. Discharge packet given to RN, Robert Bellow. [employer] authorization obtained. [facility] called for transport.   Clinical Social Work Department CLINICAL SOCIAL WORK PLACEMENT NOTE 05/23/2014  Patient:  Gregory Farrell, Gregory Farrell  Account Number:  [id number] Admit date:  05/17/2014  Clinical Social Worker:  Renold Genta  Date/time:  05/20/2014 03:26 PM  Clinical Social Work is seeking post-discharge placement for this patient at the following level of care:   SKILLED NURSING   (*CSW will update this form in Epic as items are completed)   05/20/2014  Patient/family provided with [facility] Department of Clinical Social Work's list of facilities offering this level of care within the geographic area requested by the patient (or if unable, by the patient's family).  05/20/2014  Patient/family informed of their freedom to choose among providers that offer the needed level of care, that participate in Medicare, Medicaid or managed care program needed by the patient, have an available bed and are willing to accept the patient.  05/20/2014  Patient/family informed of [facility]' ownership interest in [facility], as well as of the fact that they are under no obligation to receive care at this facility.  PASARR submitted to EDS on 05/20/2014 PASARR number received on 05/20/2014  FL2 transmitted to all facilities in geographic area requested by pt/family on  05/20/2014 FL2 transmitted to all facilities within larger geographic area on   Patient informed that his/her managed care company has contracts with or will negotiate with  certain facilities, including the following:   [facility]     Patient/family informed of bed offers received:  05/20/2014 Patient chooses bed at [facility] [place] Physician recommends and patient chooses bed at    Patient to be  transferred to [facility] on  05/23/2014 Patient to be transferred to facility by [facility] Patient and family notified of transfer on 05/23/2014 Name of family member notified:  Patient's daughter, Lovey Newcomer via phone  The following physician request were entered in Epic:   Additional Comments:   Raynaldo Opitz, [place] Social Worker cell #: [phone]

## 2014-05-23 NOTE — Progress Notes (Signed)
Mr. Dobler seemed to have a fairly quiet weekend. He is now back on amiodarone. His platelet count is going down. I just wonder if the amiodarone can be stopped and some the rales given to him.  I think we can probably stop the Unasyn. Possibly getting him on oral Levaquin might be reasonable.  He's eating.I am not sure how much he is eating. He's not having any nausea or vomiting.  He's having some loose stools. We will cut back on the lactulose and make this as needed.  He is getting out of bed a little bit. He is walking. I know that physical therapy assigned to work with him.  He's had no fever.  His right arm still looks good. There is no swelling. He is on heparin.I would continue him on heparin.  I would try to avoid oral anticoagulants, if possible when he is discharged. We might consider a low dose oral anticoagulant.pharmacy is doing great job with the heparin.  He is afebrile. His oxygen saturation is okay. Blood pressure has been fine. His lungs sound clear. Cardiac exam is regular rate and rhythm. Abdomen is soft. Extremities shows no edema.  I think, that for now, we should still try to get him strengthened. We really need to try to minimize his oral medications. I think it is frustrating for him to be on pills.  Again, we should try to minimize as much as possible.  The platelet count is on the low side.I have to believe this is medication related. I don't believe this is heparin.  Hopefully, physical therapy can continue to strengthen him.  I appreciate everybody's help with trying to make his quality of life better.  Gregory E.  Phillipians 2:3-4

## 2014-05-23 NOTE — Progress Notes (Signed)
SUBJECTIVE:  Feels ok. No palpitations  OBJECTIVE:   Vitals:   Filed Vitals:   05/22/14 2053 05/22/14 2115 05/23/14 0519 05/23/14 0805  BP:  121/63 126/60   Pulse:  89 65   Temp:  99.2 F (37.3 C) 98.2 F (36.8 C)   TempSrc:  Oral Oral   Resp:  18 18   Height:      Weight:      SpO2: 97% 93% 98% 97%   I&O's:   Intake/Output Summary (Last 24 hours) at 05/23/14 0908 Last data filed at 05/23/14 0520  Gross per 24 hour  Intake  832.5 ml  Output   1150 ml  Net -317.5 ml   TELEMETRY: Reviewed telemetry pt in NSR:     PHYSICAL EXAM General: Well developed, well nourished, in no acute distress Head:   Normal cephalic and atramatic  Lungs:  Bilateral wheezing-mild; bandage on back Heart:  HRRR S1 S2  No JVD.   Abdomen: abdomen soft and non-tender Msk:  Back normal,  Normal strength and tone for age. Extremities:   No edema.   Neuro: Alert and oriented. Psych:  Flat affect, responds appropriately Skin: No rash   LABS: Basic Metabolic Panel:  Recent Labs  05/22/14 0548  NA 140  K 4.0  CL 105  CO2 25  GLUCOSE 121*  BUN 10  CREATININE 1.40*  CALCIUM 8.2*   Liver Function Tests: No results for input(s): AST, ALT, ALKPHOS, BILITOT, PROT, ALBUMIN in the last 72 hours. No results for input(s): LIPASE, AMYLASE in the last 72 hours. CBC:  Recent Labs  05/22/14 0548 05/23/14 0455  WBC 5.1 4.8  HGB 10.0* 9.6*  HCT 31.7* 29.9*  MCV 97.5 97.7  PLT 59* 50*   Cardiac Enzymes: No results for input(s): CKTOTAL, CKMB, CKMBINDEX, TROPONINI in the last 72 hours. BNP: Invalid input(s): POCBNP D-Dimer: No results for input(s): DDIMER in the last 72 hours. Hemoglobin A1C: No results for input(s): HGBA1C in the last 72 hours. Fasting Lipid Panel: No results for input(s): CHOL, HDL, LDLCALC, TRIG, CHOLHDL, LDLDIRECT in the last 72 hours. Thyroid Function Tests: No results for input(s): TSH, T4TOTAL, T3FREE, THYROIDAB in the last 72 hours.  Invalid input(s):  FREET3 Anemia Panel: No results for input(s): VITAMINB12, FOLATE, FERRITIN, TIBC, IRON, RETICCTPCT in the last 72 hours. Coag Panel:   Lab Results  Component Value Date   INR 1.16 05/11/2014   INR 1.01 03/30/2014   INR 0.97 03/01/2014    RADIOLOGY: Dg Chest 2 View  05/17/2014   CLINICAL DATA:  Hematuria.  Nose bleed.  EXAM: CHEST  2 VIEW  COMPARISON:  05/07/2014.  FINDINGS: RIGHT IJ power port. Cardiopericardial silhouette is mildly enlarged. Small bilateral pleural effusions are present, right-greater-than-left. Associated atelectasis. No focal airspace consolidation although difficult to exclude based on basilar atelectasis.Aortic arch atherosclerosis. Probable coronary artery stents.  IMPRESSION: Small bilateral pleural effusions and mild cardiomegaly.   Electronically Signed   By: Dereck Ligas M.D.   On: 05/17/2014 22:35   Ct Angio Chest Pe W/cm &/or Wo Cm  05/07/2014   CLINICAL DATA:  History of stage IIIB squamous cell lung cancer, undergoing chemotherapy and radiation. Generalized weakness and lightheadedness. Shortness of breath, tachycardia. Evaluate for pulmonary embolus.  EXAM: CT ANGIOGRAPHY CHEST WITH CONTRAST  TECHNIQUE: Multidetector CT imaging of the chest was performed using the standard protocol during bolus administration of intravenous contrast. Multiplanar CT image reconstructions and MIPs were obtained to evaluate the vascular anatomy.  CONTRAST:  72mL OMNIPAQUE IOHEXOL 350 MG/ML SOLN  COMPARISON:  02/23/2014  FINDINGS: No filling defects in the pulmonary arteries to suggest pulmonary emboli. Stable moderate-sized hiatal hernia. Heart is normal size. Aorta is normal caliber. Densely calcified coronary arteries. Moderate calcifications throughout the aorta which is mildly tortuous.  No mediastinal, hilar, or axillary adenopathy. Stable small left hilar lymph node measuring 9 mm in short axis diameter on image 43. Chest wall soft tissues are unremarkable.  Significant  improvement in left lower lobe atelectasis/collapse since prior study. The central left hilar/infrahilar mass much improved and difficult to measure. This soft tissue surrounds the left lower lobe bronchus on image 50, but again is improved.  Right basilar atelectasis. Small subpleural nodule in the left upper lobe measures 3 mm on image 41, stable. No new or enlarging pulmonary nodules. Trace bilateral pleural effusions. No pericardial effusion.  Imaging into the upper abdomen shows no acute findings.  No acute bony abnormality or focal bone lesion. Changes of ankylosing spondylitis again noted within the thoracic spine.  Review of the MIP images confirms the above findings.  IMPRESSION: Improving left hilar/ infrahilar obstructing mass. Mild abnormal soft tissue continues does surround the left lower lobe bronchus, but there has improved aeration with decreasing atelectasis in the left lower lung. Stable small/borderline size left hilar lymph node.  Stable posterior subpleural nodule in the left upper lobe, 3 mm.  Right basilar atelectasis.  Severe coronary artery disease.  Stable hiatal hernia.   Electronically Signed   By: Rolm Baptise M.D.   On: 05/07/2014 23:48   Dg Chest Port 1 View  05/07/2014   CLINICAL DATA:  Patient complaining of shortness of breath today. History of COPD. History of lung carcinoma, coronary artery disease and hypertension. Ex-smoker.  EXAM: PORTABLE CHEST - 1 VIEW  COMPARISON:  10/07/2012.  FINDINGS: Cardiac silhouette is mildly enlarged. No mediastinal or hilar masses.  No lung consolidation or edema.  No mass or discrete nodule.  Opacity superimpose are cardiac silhouette may be due to the aorta or a small hiatal hernia.  No convincing pleural effusion.  No pneumothorax.  Right anterior chest wall power Port-A-Cath has its tip in the lower superior vena cava.  IMPRESSION: No acute cardiopulmonary disease.   Electronically Signed   By: Lajean Manes M.D.   On: 05/07/2014 11:11       ASSESSMENT: Kathyrn Lass:  AFib: Platelet drop started before amio was started.  WIll try to decrease to 400 mg daily tomorrow.  It seems to be keeping NSR for now.   Dr. Wynonia Lawman recommended Eliquis which the patient's daughter declined.  Per Dr. Marin Olp, will try to avoid oral anticoagulation.   Jettie Booze., MD  05/23/2014  9:08 AM

## 2014-05-23 NOTE — Progress Notes (Addendum)
[place] for heparin >> xarelto Indication: DVT treatment of RUE/afib  Assessment: 61 YOM with lung cancer and atrial fibrillation readmitted 11/3 with sepsis and hematuria after being discharged on same day.  Patient was started on anticoagulation during previous admission for new RUE DVT and was discharged on apixaban.  Previously on enoxaparin 1 mg/kg daily (lower dose in anticipation of endoscopy).  Apixaban has now been held and pharmacy consulted to start heparin infusion for continued treatment of recent DVT.  Significant events:  10/28: duplex of RUE reveals DVT (has implanted port in R chest wall), pharmacy asked to dose heparin gtt.  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  GI recommends continuing supportive care.  Started apixaban.  Apixaban education completed with patient, spouse, and daughter. 11/3: Discharged on apixaban (was on day #5 of therapy).  Re-admitted later in day with hematuria, epistaxis, fever.  Last dose of apixaban was a 10 mg dose given at 10:30am 11/3. 11/5 AM: Heparin level >2.20 and aPTT 172 with infusion at 850 units/hr (pt was previously therapeutic at this rate).  Drip held x 30 minutes and resumed at 550 units/hr. In PM, heparin and all other medications other than for comfort were discontinued following palliative care meeting 11/6: Patient agreed to resume care to treat DVT and infection to try to get better after discussion with oncology.  Resuming heparin drip this AM.  No bleeding since initial presentation at re-admission.  Heparin level initially > 2.0, felt to be due to poor apixaban clearance; following both aPTT and HL 11/9: patient to be discharged; Onc has requested Xarelto with reduced dosing (15 mg daily) and no BID phase.    Today, 11/9  SCr 1.4, improving.  CrCl~45 ml/min.    Hgb low, stable  Plt chronically low, but now decreased to 50k.    No reports of bleeding per nursing  Heparin level and  aPTT both therapeutic, appear to be roughly in correlation now  Moderate DDI with Xarelto - Amiodarone  Goal of Therapy:  VTE treatment Heparin level 0.3-0.7 units/ml   (aPTT 66-102) Monitor platelets by anticoagulation protocol: Yes  Plan:   Continue heparin infusion rate at 750 units/hr, stopping at 1400 today  Start Xarelto 15 mg daily, given at 1400 today.  Patient's unique clinical situation was discussed thoroughly with oncology and internal med, and it was felt that this was the best option given the patient's circumstances.  Plan is to reduce amiodarone dose to 50% at discharge, which should minimize any interactions with Xarelto.  F/u for any reports of bleeding.  F/u platelets closely  Thank you for the consult.  Allergies  Allergen Reactions  . Exenatide     REACTION: nausea    Patient Measurements: Height: 5\' 11"  (180.3 cm) Weight: 193 lb 12.6 oz (87.9 kg) IBW/kg (Calculated) : 75.3  Vital Signs: Temp: 98.2 F (36.8 C) (11/09 0519) Temp Source: Oral (11/09 0519) BP: 126/60 mmHg (11/09 0519) Pulse Rate: 65 (11/09 0519)  Labs:  Recent Labs  05/21/14 0859  05/22/14 0548 05/22/14 1252 05/23/14 0455  HGB 10.2*  --  10.0*  --  9.6*  HCT 32.1*  --  31.7*  --  29.9*  PLT 59*  --  59*  --  50*  APTT 45*  < > 65* 63* 71*  HEPARINUNFRC 1.28*  < > 0.78* 0.61 0.51  CREATININE  --   --  1.40*  --   --   < > = values  in this interval not displayed.    Reuel Boom, PharmD Pager: [phone] 05/23/2014, 9:05 AM

## 2014-05-23 NOTE — Discharge Summary (Signed)
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 Will discharge to SNF for rehab.  Diet recommendation: low carbohydrates and low sodium diet   Filed Weights   05/18/14 0049  Weight: 87.9 kg (193 lb 12.6 oz)    History of present illness:  Patient 80 year old with PMH significant for Lung cancer, who presents few hours after been discharge on 11-03 with fever, and hematuria. He was admitted for sepsis, UTI. Last admission he was admitted with FTT, Radiation esophagitis, A fib paroxysmal, renal insufficiency, he was also diagnosed at that time with DVT of right arm. He was discharge on Eliquis at that time. Family is now refusing that agent as tx for anticoagulation. Patient admitted for further evaluation and treatment.  Hospital Course:  1-Sepsis: secondary to enterococcus UTI. Initially on IV vancomycin and Zosyn.  -following urine cx's and patient response to tx, antibiotics narrow to unasyn  -afebrile and WBC's back to normal limit now -plan is to treat for a total of 12 days. -currently on day 7/12; will continue abx's for 5 more days  2-Hematuria: in setting of eliquis and infection, urine clear now.   -no further hematuria appreciated -will monitor for further bleeding as patient would on xarelto and has thrombocytopenia    3-Paroxysmal A fib; continue with amiodarone. Cardiology has recommended 400mg  daily; with potential decrease to 200mg  daily if remains in SR. -rate controlled and in SR at discharge -call office in 2 weeks for follow up appointment    4-DVT of right subclavian vein and axillary vein; daughter does not want eliquis. Discussed with Dr Marin Olp and for now plan is to use xarelto 15mg  daily  5-post radiation skin burn; continue local care.   6-esophagitis post radiation therapy: on PPI and Carafate. -appetite is good and patient is swallowing w/o difficulties   7-Acute on chronic renal failure. Cr per last records 1.6 to 1.7.  -most likely due to UTI and dehydration  -Cr 1.4 at discharge -continue abx's for UTI -avoid nephrotoxic agents -maintaining good hydration -BMET as an outpatient to follow trend   8-Squamous Cell Lung CA. Dr Marin Olp following. No plans for further chemotherapy or other intervention at this point for lung cancer. Will follow rec's.  9-physical deconditioning and weakness: per PT rec's will require SNF for rehabilitation. Family and patient in agreement. Will discharge to SNF  10-Diabetes mellitus type 2 (on insulin at home): will continue home insulin regimen and low carb diet  11-insomnia:  -will try restoril PRN -patient needs to work on good sleep hygiene   12-thrombocytopenia -stable in 50-60 range -oncology will follow as an outpatient; not clear etiology determined; but presumed to be secondary to sepsis  Procedures: None   Consultations:  Oncology  Cardiology  Palliative Care  Discharge Exam: Filed Vitals:   05/23/14 1315  BP:  129/72  Pulse: 84  Temp: 98.5 F (36.9 C)  Resp: 18    General: Alert and oriented; flat affect; afebrile and w/o any acute complaints. No overnight events. Patient complaining of difficulty sleeping  Cardiovascular: S 1, S 2 RRR  Respiratory: no wheezing, no ronchus.   Abdomen: BS present, soft,  NT  Musculoskeletal: right arm slightly swollen; but much better base on nursing staff description and previous documentation; keep it elevated as much as possible   Discharge Instructions You were cared for by a hospitalist during your hospital stay. If you have any questions about your discharge medications or the care you received while you were in the hospital after you are discharged, you can call the unit and asked to speak with the hospitalist on call if the hospitalist that took care of you is not available. Once you are discharged, your primary care physician will handle any further medical issues. Please note that NO REFILLS for any discharge medications will be authorized once you are discharged, as it is imperative that you return to your primary care physician (or establish a relationship with a primary care physician if you do not have one) for your aftercare needs so that they can reassess your need for medications and monitor your lab values.  Discharge Instructions    Discharge instructions    Complete by:  As directed   Maintain good hydration Watch for any signs of bleeding CBC in 5 days to follow Hgb and platelets trend Follow up with Dr. Marin Olp in 1 week (call office to set up appointment) Follow a low carbohydrates diet and watch sodium intake (less than 2.5 grams of sodium per day) Rehabilitation and conditioning per SNF protocol  Levaquin to be taking on daily bases for 5 more days          Current Discharge Medication List    START taking these medications   Details  citalopram (CELEXA) 20 MG tablet Take 1 tablet (20 mg total) by mouth daily.   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 Qty: 30 tablet    temazepam (RESTORIL) 7.5 MG capsule Take 1 capsule  (7.5 mg total) by mouth at bedtime as needed for sleep. Qty: 30 capsule, Refills: 0      CONTINUE these medications which have CHANGED   Details  amiodarone (PACERONE) 400 MG tablet Take 1 tablet (400 mg total) by mouth daily. Take 400 mg Twice a day for 7 days, then 400 mg daily for 2 weeks, then 200 mg daily. Qty: 60 tablet, Refills: 0    lactulose (CHRONULAC) 10 GM/15ML solution Take 30 mLs (20 g total) by mouth 2 (two) times daily as needed for mild constipation or moderate constipation. Qty: 500 mL, Refills: 0   Associated Diagnoses: Malignant neoplasm of hilus of left lung      CONTINUE these medications which have NOT CHANGED   Details  atorvastatin (LIPITOR) 80 MG tablet Take 80 mg by mouth daily.    budesonide-formoterol (SYMBICORT) 160-4.5 MCG/ACT inhaler Inhale 2 puffs into the lungs 2 (two) times daily.    feeding supplement, ENSURE, (ENSURE) PUDG Take 1 Container by mouth 3 (three) times daily between meals as needed (Per pt request). Qty: 30 Can, Refills: 0    insulin  detemir (LEVEMIR) 100 UNIT/ML injection Inject 0.03 mLs (3 Units total) into the skin daily. May increase by 2 units every 3 days as directed Qty: 10 mL, Refills: 11    ondansetron (ZOFRAN) 8 MG tablet Take 8 mg by mouth every 8 (eight) hours as needed for nausea or vomiting. Start after Chemo    pantoprazole sodium (PROTONIX) 40 mg/20 mL PACK Take 20 mLs (40 mg total) by mouth 2 (two) times daily. 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 Qty: 420 mL, Refills: 0    tamsulosin (FLOMAX) 0.4 MG CAPS capsule Take 1 capsule (0.4 mg total) by mouth daily after supper. Qty: 30 capsule, Refills: 0    Wound Cleansers (RADIAPLEX EX) Apply 1 application topically daily. To Neck & Back.    levalbuterol (XOPENEX) 1.25 MG/0.5ML nebulizer solution Take 1.25 mg by nebulization every 2 (two) hours as needed for wheezing or shortness of breath. Qty: 1 each, Refills: 12    prochlorperazine (COMPAZINE) 10 MG tablet Take 10 mg by mouth every 6 (six) hours as needed for nausea or vomiting.      STOP taking these medications     apixaban (ELIQUIS) 5 MG TABS tablet      dronabinol (MARINOL) 2.5 MG capsule      metoprolol tartrate (LOPRESSOR) 12.5 mg TABS tablet      mometasone-formoterol (DULERA) 200-5 MCG/ACT AERO      nystatin (MYCOSTATIN) 100000 UNIT/ML suspension      zolpidem (AMBIEN) 5 MG tablet        Allergies  Allergen Reactions  . Exenatide     REACTION: nausea   Follow-up Information    Follow up with Volanda Napoleon, MD. Schedule an appointment as soon as possible for a visit in 1 week.   Specialty:  Oncology   Contact information:   [address], [address] [place] [address] [phone]        The results of significant diagnostics from this hospitalization (including imaging, microbiology, ancillary and laboratory) are listed below for reference.    Significant Diagnostic Studies: Dg Chest 2 View  05/17/2014   CLINICAL DATA:  Hematuria.  Nose bleed.  EXAM:  CHEST  2 VIEW  COMPARISON:  05/07/2014.  FINDINGS: RIGHT IJ power port. Cardiopericardial silhouette is mildly enlarged. Small bilateral pleural effusions are present, right-greater-than-left. Associated atelectasis. No focal airspace consolidation although difficult to exclude based on basilar atelectasis.Aortic arch atherosclerosis. Probable coronary artery stents.  IMPRESSION: Small bilateral pleural effusions and mild cardiomegaly.   Electronically Signed   By: Dereck Ligas M.D.   On: 05/17/2014 22:35   Ct Angio Chest Pe W/cm &/or Wo Cm  05/07/2014   CLINICAL DATA:  History of stage IIIB squamous cell lung cancer, undergoing chemotherapy and radiation. Generalized weakness and lightheadedness. Shortness  of breath, tachycardia. Evaluate for pulmonary embolus.  EXAM: CT ANGIOGRAPHY CHEST WITH CONTRAST  TECHNIQUE: Multidetector CT imaging of the chest was performed using the standard protocol during bolus administration of intravenous contrast. Multiplanar CT image reconstructions and MIPs were obtained to evaluate the vascular anatomy.  CONTRAST:  12mL OMNIPAQUE IOHEXOL 350 MG/ML SOLN  COMPARISON:  02/23/2014  FINDINGS: No filling defects in the pulmonary arteries to suggest pulmonary emboli. Stable moderate-sized hiatal hernia. Heart is normal size. Aorta is normal caliber. Densely calcified coronary arteries. Moderate calcifications throughout the aorta which is mildly tortuous.  No mediastinal, hilar, or axillary adenopathy. Stable small left hilar lymph node measuring 9 mm in short axis diameter on image 43. Chest wall soft tissues are unremarkable.  Significant improvement in left lower lobe atelectasis/collapse since prior study. The central left hilar/infrahilar mass much improved and difficult to measure. This soft tissue surrounds the left lower lobe bronchus on image 50, but again is improved.  Right basilar atelectasis. Small subpleural nodule in the left upper lobe measures 3 mm on image 41,  stable. No new or enlarging pulmonary nodules. Trace bilateral pleural effusions. No pericardial effusion.  Imaging into the upper abdomen shows no acute findings.  No acute bony abnormality or focal bone lesion. Changes of ankylosing spondylitis again noted within the thoracic spine.  Review of the MIP images confirms the above findings.  IMPRESSION: Improving left hilar/ infrahilar obstructing mass. Mild abnormal soft tissue continues does surround the left lower lobe bronchus, but there has improved aeration with decreasing atelectasis in the left lower lung. Stable small/borderline size left hilar lymph node.  Stable posterior subpleural nodule in the left upper lobe, 3 mm.  Right basilar atelectasis.  Severe coronary artery disease.  Stable hiatal hernia.   Electronically Signed   By: Rolm Baptise M.D.   On: 05/07/2014 23:48   Dg Chest Port 1 View  05/07/2014   CLINICAL DATA:  Patient complaining of shortness of breath today. History of COPD. History of lung carcinoma, coronary artery disease and hypertension. Ex-smoker.  EXAM: PORTABLE CHEST - 1 VIEW  COMPARISON:  10/07/2012.  FINDINGS: Cardiac silhouette is mildly enlarged. No mediastinal or hilar masses.  No lung consolidation or edema.  No mass or discrete nodule.  Opacity superimpose are cardiac silhouette may be due to the aorta or a small hiatal hernia.  No convincing pleural effusion.  No pneumothorax.  Right anterior chest wall power Port-A-Cath has its tip in the lower superior vena cava.  IMPRESSION: No acute cardiopulmonary disease.   Electronically Signed   By: Lajean Manes M.D.   On: 05/07/2014 11:11    Microbiology: Recent Results (from the past 240 hour(s))  Urine culture     Status: None   Collection Time: 05/16/14  8:43 AM  Result Value Ref Range Status   Specimen Description URINE, CLEAN CATCH  Final   Special Requests NONE  Final   Culture  Setup Time   Final    05/16/2014 17:23 Performed at [facility]   Final    >=100,000 COLONIES/ML Performed at [facility]    Culture   Final    ENTEROCOCCUS SPECIES Performed at [facility]    Report Status 05/18/2014 FINAL  Final   Organism ID, Bacteria ENTEROCOCCUS SPECIES  Final      Susceptibility   Enterococcus species - MIC*    AMPICILLIN <=2 SENSITIVE Sensitive     LEVOFLOXACIN 1 SENSITIVE Sensitive  NITROFURANTOIN <=16 SENSITIVE Sensitive     VANCOMYCIN 1 SENSITIVE Sensitive     TETRACYCLINE <=1 SENSITIVE Sensitive     * ENTEROCOCCUS SPECIES  Culture, blood (routine x 2)     Status: None (Preliminary result)   Collection Time: 05/17/14  9:38 PM  Result Value Ref Range Status   Specimen Description BLOOD LEFT ANTECUBITAL  Final   Special Requests BOTTLES DRAWN AEROBIC AND ANAEROBIC 5CC AND 3CC  Final   Culture  Setup Time   Final    05/18/2014 01:06 Performed at [facility]    Culture   Final           BLOOD CULTURE RECEIVED NO GROWTH TO DATE CULTURE WILL BE HELD FOR 5 DAYS BEFORE ISSUING A FINAL NEGATIVE REPORT Performed at [facility]    Report Status PENDING  Incomplete  Culture, blood (routine x 2)     Status: None (Preliminary result)   Collection Time: 05/17/14 10:57 PM  Result Value Ref Range Status   Specimen Description BLOOD PORTA CATH  Final   Special Requests BOTTLES DRAWN AEROBIC AND ANAEROBIC 3CC  Final   Culture  Setup Time   Final    05/18/2014 01:07 Performed at [facility]    Culture   Final           BLOOD CULTURE RECEIVED NO GROWTH TO DATE CULTURE WILL BE HELD FOR 5 DAYS BEFORE ISSUING A FINAL NEGATIVE REPORT Performed at [facility]    Report Status PENDING  Incomplete  Urine culture     Status: None   Collection Time: 05/18/14  5:52 AM  Result Value Ref Range Status   Specimen Description URINE, CLEAN CATCH  Final   Special Requests NONE  Final   Culture  Setup Time   Final    05/18/2014 08:39 Performed at [facility]   Final    85,000 COLONIES/ML Performed at [facility]    Culture   Final    ENTEROCOCCUS SPECIES Performed at [facility]    Report Status 05/21/2014 FINAL  Final   Organism ID, Bacteria ENTEROCOCCUS SPECIES  Final      Susceptibility   Enterococcus species - MIC*    AMPICILLIN <=2 SENSITIVE Sensitive     LEVOFLOXACIN 1 SENSITIVE Sensitive     NITROFURANTOIN <=16 SENSITIVE Sensitive     VANCOMYCIN 1 SENSITIVE Sensitive     TETRACYCLINE <=1 SENSITIVE Sensitive     * ENTEROCOCCUS SPECIES     Labs: Basic Metabolic Panel:  Recent Labs Lab 05/17/14 2138 05/18/14 0430 05/19/14 0135 05/20/14 0849 05/22/14 0548  NA 134* 136* 135* 138 140  K 4.1 4.2 3.9 4.3 4.0  CL 97 100 100 101 105  CO2 24 25 26 26 25   GLUCOSE 154* 166* 101* 104* 121*  BUN 16 17 21 16 10   CREATININE 1.59* 1.69* 1.76* 1.60* 1.40*  CALCIUM 8.2* 7.7* 8.0* 8.3* 8.2*   Liver Function Tests:  Recent Labs Lab 05/17/14 2138 05/18/14 0430 05/19/14 0135 05/20/14 0849  AST 20 15 16 19   ALT 14 12 11 15   ALKPHOS 99 90 101 84  BILITOT 0.6 0.7 0.4 0.4  PROT 5.5* 4.8* 5.0* 5.2*  ALBUMIN 2.1* 1.7* 1.8* 1.8*   CBC:  Recent Labs Lab 05/17/14 0635 05/17/14 2138 05/18/14 0430 05/19/14 0135 05/20/14 0849 05/21/14 0859 05/22/14 0548 05/23/14 0455  WBC 4.6 6.8 11.0* 8.6 6.1 5.7 5.1 4.8  NEUTROABS  2.7 6.3 9.0* 7.1  --   --   --   --   HGB 10.5* 11.4* 9.9* 9.8* 10.2* 10.2* 10.0* 9.6*  HCT 32.4* 35.5* 30.9* 30.5* 32.5* 32.1* 31.7* 29.9*  MCV 97.3 95.4 97.5 96.5 97.9 97.9 97.5 97.7  PLT 74* 71* 65* 69* 62* 59* 59* 50*   BNP (last 3 results)  Recent Labs  05/18/14 0430  PROBNP 3964.0*   CBG:  Recent Labs Lab 05/22/14 0803 05/22/14 1135 05/22/14 1641 05/23/14 0733 05/23/14 1136  GLUCAP 109* 123* 151* 105* 155*    Signed:  Barton Dubois  [facility] 05/23/2014, 2:31 PM

## 2014-05-24 ENCOUNTER — Encounter: Payer: Self-pay | Admitting: Internal Medicine

## 2014-05-24 ENCOUNTER — Ambulatory Visit: Payer: Medicare HMO

## 2014-05-24 ENCOUNTER — Non-Acute Institutional Stay (SKILLED_NURSING_FACILITY): Payer: Medicare HMO | Admitting: Internal Medicine

## 2014-05-24 DIAGNOSIS — R319 Hematuria, unspecified: Secondary | ICD-10-CM

## 2014-05-24 DIAGNOSIS — D696 Thrombocytopenia, unspecified: Secondary | ICD-10-CM

## 2014-05-24 DIAGNOSIS — I82621 Acute embolism and thrombosis of deep veins of right upper extremity: Secondary | ICD-10-CM

## 2014-05-24 DIAGNOSIS — I480 Paroxysmal atrial fibrillation: Secondary | ICD-10-CM

## 2014-05-24 DIAGNOSIS — K209 Esophagitis, unspecified without bleeding: Secondary | ICD-10-CM

## 2014-05-24 DIAGNOSIS — A419 Sepsis, unspecified organism: Secondary | ICD-10-CM

## 2014-05-24 DIAGNOSIS — C3401 Malignant neoplasm of right main bronchus: Secondary | ICD-10-CM

## 2014-05-24 DIAGNOSIS — E1121 Type 2 diabetes mellitus with diabetic nephropathy: Secondary | ICD-10-CM

## 2014-05-24 LAB — CULTURE, BLOOD (ROUTINE X 2)
CULTURE: NO GROWTH
Culture: NO GROWTH

## 2014-05-24 NOTE — Progress Notes (Signed)
MRN: [id number] Name: Gregory Farrell  Sex: male Age: 80 y.o. DOB: 1932-08-23  [place] #: Karren Burly Facility/Room: 121A Level Of Care: SNF Provider: Inocencio Homes D Emergency Contacts: Extended Emergency Contact Information Primary Emergency Contact: Sheen,Hilda Address: [address] [place], [place] [address] Johnnette Litter of [place] Phone: [phone] Mobile Phone: [phone] Relation: Spouse Secondary Emergency Contact: Harriette Ohara [place] of [place] Phone: [phone] Mobile Phone: [phone] Relation: Daughter  Code Status: DNR  Allergies: Exenatide  Chief Complaint  Patient presents with  . New Admit To SNF    HPI: Patient is 80 y.o. male who is admitted to SNF for generalized weakness from Lung CA with esophagitis 2/2 XRT.  For OT/PT.  Past Medical History  Diagnosis Date  . Hypertension   . COPD (chronic obstructive pulmonary disease)   . GERD (gastroesophageal reflux disease)   . Renal insufficiency   . Hyperlipidemia   . CAD (coronary artery disease)   . B12 deficiency   . Memory loss   . Vertigo   . Diabetes mellitus     type II, poly neuropathy  . Overweight 01/31/2013  . Lung cancer, hilus 03/22/2014    Past Surgical History  Procedure Laterality Date  . Coronary angioplasty with stent placement      more than 10 years ago  . Inguinal hernia repair  2010  . Video bronchoscopy Bilateral 03/07/2014    Procedure: VIDEO BRONCHOSCOPY WITHOUT FLUORO;  Surgeon: Kathee Delton, MD;  Location: [facility];  Service: Cardiopulmonary;  Laterality: Bilateral;  . Esophagogastroduodenoscopy N/A 05/12/2014    Procedure: ESOPHAGOGASTRODUODENOSCOPY (EGD);  Surgeon: Arta Silence, MD;  Location: [facility];  Service: Endoscopy;  Laterality: N/A;      Medication List       This list is accurate as of: 05/24/14 11:59 PM.  Always use your most recent med list.               amiodarone 400 MG tablet  Commonly known as:   PACERONE  Take 1 tablet (400 mg total) by mouth daily. Take 400 mg Twice a day for 7 days, then 400 mg daily for 2 weeks, then 200 mg daily.     atorvastatin 80 MG tablet  Commonly known as:  LIPITOR  Take 80 mg by mouth daily.     budesonide-formoterol 160-4.5 MCG/ACT inhaler  Commonly known as:  SYMBICORT  Inhale 2 puffs into the lungs 2 (two) times daily.     citalopram 20 MG tablet  Commonly known as:  CELEXA  Take 1 tablet (20 mg total) by mouth daily.     feeding supplement (ENSURE) Pudg  Take 1 Container by mouth 3 (three) times daily between meals as needed (Per pt request).     insulin detemir 100 UNIT/ML injection  Commonly known as:  LEVEMIR  Inject 0.03 mLs (3 Units total) into the skin daily. May increase by 2 units every 3 days as directed     lactulose 10 GM/15ML solution  Commonly known as:  CHRONULAC  Take 30 mLs (20 g total) by mouth 2 (two) times daily as needed for mild constipation or moderate constipation.     levalbuterol 1.25 MG/0.5ML nebulizer solution  Commonly known as:  XOPENEX  Take 1.25 mg by nebulization every 2 (two) hours as needed for wheezing or shortness of breath.     levofloxacin 250 MG tablet  Commonly known as:  LEVAQUIN  To be  taking on daily bases for 6 more days     ondansetron 8 MG tablet  Commonly known as:  ZOFRAN  Take 8 mg by mouth every 8 (eight) hours as needed for nausea or vomiting. Start after Chemo     pantoprazole sodium 40 mg/20 mL Pack  Commonly known as:  PROTONIX  Take 20 mLs (40 mg total) by mouth 2 (two) times daily.     prochlorperazine 10 MG tablet  Commonly known as:  COMPAZINE  Take 10 mg by mouth every 6 (six) hours as needed for nausea or vomiting.     RADIAPLEX EX  Apply 1 application topically daily. To Neck & Back.     Rivaroxaban 15 MG Tabs tablet  Commonly known as:  XARELTO  Take 1 tablet (15 mg total) by mouth daily with supper.     sucralfate 1 GM/10ML suspension  Commonly known as:   CARAFATE  Take 10 mLs (1 g total) by mouth 4 (four) times daily -  with meals and at bedtime.     tamsulosin 0.4 MG Caps capsule  Commonly known as:  FLOMAX  Take 1 capsule (0.4 mg total) by mouth daily after supper.     temazepam 7.5 MG capsule  Commonly known as:  RESTORIL  Take 1 capsule (7.5 mg total) by mouth at bedtime as needed for sleep.        No orders of the defined types were placed in this encounter.    Immunization History  Administered Date(s) Administered  . Influenza Split 05/15/2012  . Influenza Whole 05/12/2007, 03/28/2009, 03/09/2010, 03/29/2013  . Influenza-Unspecified 03/01/2014  . Pneumococcal Conjugate-13 07/12/2005, 04/27/2013  . Pneumococcal Polysaccharide-23 08/27/2004  . Tdap 05/22/2011  . Zoster 03/02/2010    History  Substance Use Topics  . Smoking status: Former Smoker -- 1.00 packs/day for 20 years    Types: Cigarettes    Start date: 09/09/1982    Quit date: 07/15/2002  . Smokeless tobacco: Never Used     Comment: quit smoking 11 years ago  . Alcohol Use: No    Family history is noncontributory    Review of Systems  DATA OBTAINED: from patient GENERAL:  no fevers,+ fatigue SKIN: No itching, rash  EYES: No eye pain, redness, discharge EARS: No earache, tinnitus, change in hearing NOSE: No congestion, drainage or bleeding  MOUTH/THROAT: No mouth or tooth pain, No sore throat RESPIRATORY: No cough, wheezing, SOB CARDIAC: No chest pain, palpitations, lower extremity edema  GI: No abdominal pain, No N/V/D or constipation, No heartburn or reflux  GU: No dysuria, frequency or urgency, or incontinence  MUSCULOSKELETAL: No unrelieved bone/joint pain NEUROLOGIC: No headache, dizziness  PSYCHIATRIC: No overt anxiety or sadness, No behavior issue.   Filed Vitals:   05/24/14 2159  BP: 125/68  Pulse: 78  Temp: 97.7 F (36.5 C)  Resp: 18    Physical Exam  GENERAL APPEARANCE: Alert, conversant,  Weak appearing  SKIN: No diaphoresis  rash HEAD: Normocephalic, atraumatic  EYES: Conjunctiva/lids clear. Pupils round, reactive. EOMs intact.  EARS: External exam WNL, canals clear. Hearing grossly normal.  NOSE: No deformity or discharge.  MOUTH/THROAT: Lips w/o lesions  RESPIRATORY: Breathing is even, unlabored. Lung sounds are diffusely decreased   CARDIOVASCULAR: Heart RRR no murmurs, rubs or gallops. Trace peripheral edema.   GASTROINTESTINAL: Abdomen is soft, non-tender, not distended w/ normal bowel sounds. GENITOURINARY: Bladder non tender, not distended  MUSCULOSKELETAL: No abnormal joints or musculature NEUROLOGIC:  Cranial nerves 2-12 grossly intact. Moves  all extremities  PSYCHIATRIC: Mood and affect appropriate to situation, no behavioral issues  Patient Active Problem List   Diagnosis Date Noted  . DVT of upper extremity (deep vein thrombosis) 05/29/2014  . Acute esophagitis 05/29/2014  . Blood poisoning   . Insomnia   . Depression   . Malignant neoplasm of hilus of right lung   . Acute renal failure superimposed on stage 3 chronic kidney disease   . Acute renal failure syndrome   . Palliative care encounter 05/19/2014  . Weakness generalized 05/19/2014  . Sepsis 05/18/2014  . Fever   . Hematuria 05/17/2014  . On amiodarone therapy 05/16/2014  . Paroxysmal atrial fibrillation   . Protein-calorie malnutrition, severe 05/09/2014  . Dehydration 05/07/2014  . Acute respiratory failure with hypoxia 05/07/2014  . Elevated d-dimer 05/07/2014  . Generalized weakness 05/07/2014  . Orthostatic hypotension 05/07/2014  . Iron deficiency anemia, unspecified 03/31/2014  . Low testosterone 03/31/2014  . Lung cancer, hilus 03/22/2014  . Lung mass 02/27/2014  . Solitary pulmonary nodule 02/20/2014  . Hip pain, right 10/31/2013  . Cerumen impaction 10/20/2013  . Thrombocytopenia 04/11/2013  . Overweight 01/31/2013  . Lung nodules 11/18/2012  . Claudication 05/27/2012  . Back pain 12/01/2011  . Hearing  decreased 09/08/2011  . Adjustment disorder with depressed mood 11/28/2010  . DYSPNEA 08/02/2009  . GERD 11/21/2008  . B12 DEFICIENCY 02/17/2008  . MEMORY LOSS 01/28/2008  . Diabetes mellitus, type 2 02/04/2007  . HYPERLIPIDEMIA 02/04/2007  . HYPERTENSION 02/04/2007  . Coronary atherosclerosis 02/04/2007  . COPD gold stage C. 02/04/2007  . RENAL INSUFFICIENCY 02/04/2007    CBC    Component Value Date/Time   WBC 4.0 05/27/2014 1331   WBC 4.8 05/23/2014 0455   WBC 1.3* 04/12/2014 1317   RBC 3.08* 05/27/2014 1331   RBC 3.06* 05/23/2014 0455   RBC 4.13* 04/12/2014 1317   HGB 9.9* 05/27/2014 1331   HGB 9.6* 05/23/2014 0455   HGB 11.8* 04/12/2014 1317   HCT 31.1* 05/27/2014 1331   HCT 29.9* 05/23/2014 0455   HCT 37.4* 04/12/2014 1317   PLT 36* 05/27/2014 1331   PLT 50* 05/23/2014 0455   PLT 99* 04/12/2014 1317   MCV 101* 05/27/2014 1331   MCV 97.7 05/23/2014 0455   MCV 90.7 04/12/2014 1317   LYMPHSABS 0.7* 05/27/2014 1331   LYMPHSABS 0.8 05/19/2014 0135   LYMPHSABS 0.2* 04/12/2014 1317   MONOABS 0.7 05/19/2014 0135   MONOABS 0.1 04/12/2014 1317   EOSABS 0.1 05/27/2014 1331   EOSABS 0.0 05/19/2014 0135   EOSABS 0.0 04/12/2014 1317   BASOSABS 0.0 05/27/2014 1331   BASOSABS 0.0 05/19/2014 0135   BASOSABS 0.0 04/12/2014 1317    CMP     Component Value Date/Time   NA 137 05/27/2014 1331   NA 140 05/22/2014 0548   NA 136 04/12/2014 1317   K 3.6 05/27/2014 1331   K 4.0 05/22/2014 0548   K 4.6 04/12/2014 1317   CL 98 05/27/2014 1331   CL 105 05/22/2014 0548   CO2 24 05/27/2014 1331   CO2 25 05/22/2014 0548   CO2 26 04/12/2014 1317   GLUCOSE 224* 05/27/2014 1331   GLUCOSE 121* 05/22/2014 0548   GLUCOSE 158* 04/12/2014 1317   BUN 10 05/27/2014 1331   BUN 10 05/22/2014 0548   BUN 22.3 04/12/2014 1317   CREATININE 1.7* 05/27/2014 1331   CREATININE 1.40* 05/22/2014 0548   CREATININE 1.5* 04/12/2014 1317   CALCIUM 8.4 05/27/2014 1331   CALCIUM 8.2*  05/22/2014 0548    CALCIUM 9.2 04/12/2014 1317   PROT 5.9* 05/27/2014 1331   PROT 5.2* 05/20/2014 0849   ALBUMIN 1.8* 05/20/2014 0849   AST 21 05/27/2014 1331   AST 19 05/20/2014 0849   ALT 16 05/27/2014 1331   ALT 15 05/20/2014 0849   ALKPHOS 81 05/27/2014 1331   ALKPHOS 84 05/20/2014 0849   BILITOT 0.60 05/27/2014 1331   BILITOT 0.4 05/20/2014 0849   GFRNONAA 46* 05/22/2014 0548   GFRAA 54* 05/22/2014 0548    Assessment and Plan  Sepsis secondary to enterococcus UTI. Initially on IV vancomycin and Zosyn.  -following urine cx's and patient response to tx, antibiotics narrow to unasyn  -afebrile and WBC's back to normal limit now -plan is to treat for a total of 12 days. -currently on day 7/12; will continue abx's for 5 more days  Hematuria in setting of eliquis and infection, urine clear now.  -no further hematuria appreciated -will monitor for further bleeding as patient would on xarelto and has thrombocytopenia    Paroxysmal atrial fibrillation continue with amiodarone. Cardiology has recommended 400mg  daily; with potential decrease to 200mg  daily if remains in SR. -rate controlled and in SR at discharge -call office in 2 weeks for follow up appointment   DVT of upper extremity (deep vein thrombosis) daughter does not want eliquis. Discussed with Dr Marin Olp and for now plan is to use xarelto 15mg  daily  Lung cancer, hilus Dr Marin Olp following. No plans for further chemotherapy or other intervention at this point for lung cancer. Will follow rec's.   Acute esophagitis post radiation therapy: on PPI and Carafate. -appetite is good and patient is swallowing w/o difficulties   Diabetes mellitus, type 2 will continue home insulin regimen and low carb diet; A1c 7.1  Thrombocytopenia Chronic and reported stable at PLT 50-60    Hennie Duos, MD

## 2014-05-25 ENCOUNTER — Ambulatory Visit: Payer: Medicare HMO

## 2014-05-25 ENCOUNTER — Telehealth: Payer: Self-pay | Admitting: Hematology & Oncology

## 2014-05-25 NOTE — Telephone Encounter (Signed)
Nursing home called scheduled 11-13

## 2014-05-26 ENCOUNTER — Ambulatory Visit: Payer: Medicare HMO

## 2014-05-27 ENCOUNTER — Ambulatory Visit (HOSPITAL_BASED_OUTPATIENT_CLINIC_OR_DEPARTMENT_OTHER): Payer: Medicare HMO | Admitting: Hematology & Oncology

## 2014-05-27 ENCOUNTER — Other Ambulatory Visit (HOSPITAL_BASED_OUTPATIENT_CLINIC_OR_DEPARTMENT_OTHER): Payer: Medicare HMO | Admitting: Lab

## 2014-05-27 ENCOUNTER — Ambulatory Visit: Payer: Medicare HMO

## 2014-05-27 VITALS — BP 118/53 | HR 74 | Temp 98.1°F | Resp 16

## 2014-05-27 DIAGNOSIS — C3400 Malignant neoplasm of unspecified main bronchus: Secondary | ICD-10-CM

## 2014-05-27 DIAGNOSIS — C3402 Malignant neoplasm of left main bronchus: Secondary | ICD-10-CM

## 2014-05-27 DIAGNOSIS — E1121 Type 2 diabetes mellitus with diabetic nephropathy: Secondary | ICD-10-CM

## 2014-05-27 DIAGNOSIS — C3401 Malignant neoplasm of right main bronchus: Secondary | ICD-10-CM

## 2014-05-27 DIAGNOSIS — D509 Iron deficiency anemia, unspecified: Secondary | ICD-10-CM

## 2014-05-27 LAB — CMP (CANCER CENTER ONLY)
ALK PHOS: 81 U/L (ref 26–84)
ALT(SGPT): 16 U/L (ref 10–47)
AST: 21 U/L (ref 11–38)
Albumin: 2.5 g/dL — ABNORMAL LOW (ref 3.3–5.5)
BILIRUBIN TOTAL: 0.6 mg/dL (ref 0.20–1.60)
BUN: 10 mg/dL (ref 7–22)
CO2: 24 mEq/L (ref 18–33)
Calcium: 8.4 mg/dL (ref 8.0–10.3)
Chloride: 98 mEq/L (ref 98–108)
Creat: 1.7 mg/dl — ABNORMAL HIGH (ref 0.6–1.2)
Glucose, Bld: 224 mg/dL — ABNORMAL HIGH (ref 73–118)
Potassium: 3.6 mEq/L (ref 3.3–4.7)
Sodium: 137 mEq/L (ref 128–145)
Total Protein: 5.9 g/dL — ABNORMAL LOW (ref 6.4–8.1)

## 2014-05-27 LAB — CBC WITH DIFFERENTIAL (CANCER CENTER ONLY)
BASO#: 0 10*3/uL (ref 0.0–0.2)
BASO%: 0.3 % (ref 0.0–2.0)
EOS ABS: 0.1 10*3/uL (ref 0.0–0.5)
EOS%: 1.8 % (ref 0.0–7.0)
HCT: 31.1 % — ABNORMAL LOW (ref 38.7–49.9)
HGB: 9.9 g/dL — ABNORMAL LOW (ref 13.0–17.1)
LYMPH#: 0.7 10*3/uL — ABNORMAL LOW (ref 0.9–3.3)
LYMPH%: 16.6 % (ref 14.0–48.0)
MCH: 32.1 pg (ref 28.0–33.4)
MCHC: 31.8 g/dL — ABNORMAL LOW (ref 32.0–35.9)
MCV: 101 fL — AB (ref 82–98)
MONO#: 0.4 10*3/uL (ref 0.1–0.9)
MONO%: 9.8 % (ref 0.0–13.0)
NEUT#: 2.9 10*3/uL (ref 1.5–6.5)
NEUT%: 71.5 % (ref 40.0–80.0)
PLATELETS: 36 10*3/uL — AB (ref 145–400)
RBC: 3.08 10*6/uL — AB (ref 4.20–5.70)
RDW: 20.6 % — ABNORMAL HIGH (ref 11.1–15.7)
WBC: 4 10*3/uL (ref 4.0–10.0)

## 2014-05-27 LAB — PREALBUMIN: Prealbumin: 13.3 mg/dL — ABNORMAL LOW (ref 17.0–34.0)

## 2014-05-27 MED ORDER — SODIUM CHLORIDE 0.9 % IJ SOLN
10.0000 mL | INTRAMUSCULAR | Status: DC | PRN
  Administered 2014-05-27: 10 mL via INTRAVENOUS
  Filled 2014-05-27: qty 10

## 2014-05-27 MED ORDER — ALTEPLASE 2 MG IJ SOLR
INTRAMUSCULAR | Status: AC
  Filled 2014-05-27: qty 2

## 2014-05-27 MED ORDER — DRONABINOL 2.5 MG PO CAPS: 2.5000 mg | ORAL_CAPSULE | Freq: Two times a day (BID) | ORAL | Status: DC

## 2014-05-27 MED ORDER — HEPARIN SOD (PORK) LOCK FLUSH 100 UNIT/ML IV SOLN
500.0000 [IU] | Freq: Once | INTRAVENOUS | Status: AC
  Administered 2014-05-27: 500 [IU] via INTRAVENOUS
  Filled 2014-05-27: qty 5

## 2014-05-27 MED ORDER — ALTEPLASE 2 MG IJ SOLR
2.0000 mg | Freq: Once | INTRAMUSCULAR | Status: AC | PRN
  Administered 2014-05-27: 2 mg
  Filled 2014-05-27: qty 2

## 2014-05-27 MED ORDER — TEMAZEPAM 22.5 MG PO CAPS: 22.5000 mg | ORAL_CAPSULE | Freq: Every evening | ORAL | Status: DC | PRN

## 2014-05-27 MED ORDER — SODIUM CHLORIDE 0.9 % IV SOLN
Freq: Once | INTRAVENOUS | Status: AC
  Administered 2014-05-27: 15:00:00 via INTRAVENOUS

## 2014-05-29 ENCOUNTER — Encounter: Payer: Self-pay | Admitting: Internal Medicine

## 2014-05-29 DIAGNOSIS — T66XXXA Radiation sickness, unspecified, initial encounter: Secondary | ICD-10-CM | POA: Insufficient documentation

## 2014-05-29 DIAGNOSIS — I82629 Acute embolism and thrombosis of deep veins of unspecified upper extremity: Secondary | ICD-10-CM | POA: Insufficient documentation

## 2014-05-29 DIAGNOSIS — K208 Other esophagitis: Secondary | ICD-10-CM

## 2014-05-29 NOTE — Assessment & Plan Note (Signed)
will continue home insulin regimen and low carb diet; A1c 7.1

## 2014-05-29 NOTE — Assessment & Plan Note (Signed)
Dr Marin Olp following. No plans for further chemotherapy or other intervention at this point for lung cancer. Will follow rec's.

## 2014-05-29 NOTE — Assessment & Plan Note (Signed)
secondary to enterococcus UTI. Initially on IV vancomycin and Zosyn.  -following urine cx's and patient response to tx, antibiotics narrow to unasyn  -afebrile and WBC's back to normal limit now -plan is to treat for a total of 12 days. -currently on day 7/12; will continue abx's for 5 more days

## 2014-05-29 NOTE — Assessment & Plan Note (Signed)
daughter does not want eliquis. Discussed with Dr Marin Olp and for now plan is to use xarelto 15mg  daily

## 2014-05-29 NOTE — Assessment & Plan Note (Signed)
continue with amiodarone. Cardiology has recommended 400mg  daily; with potential decrease to 200mg  daily if remains in SR. -rate controlled and in SR at discharge -call office in 2 weeks for follow up appointment

## 2014-05-29 NOTE — Assessment & Plan Note (Signed)
Chronic and reported stable at PLT 50-60

## 2014-05-29 NOTE — Assessment & Plan Note (Signed)
in setting of eliquis and infection, urine clear now.  -no further hematuria appreciated -will monitor for further bleeding as patient would on xarelto and has thrombocytopenia

## 2014-05-29 NOTE — Assessment & Plan Note (Signed)
post radiation therapy: on PPI and Carafate. -appetite is good and patient is swallowing w/o difficulties

## 2014-05-30 LAB — IRON AND TIBC CHCC
%SAT: 30 % (ref 20–55)
Iron: 43 ug/dL (ref 42–163)
TIBC: 144 ug/dL — ABNORMAL LOW (ref 202–409)
UIBC: 100 ug/dL — AB (ref 117–376)

## 2014-05-30 LAB — FERRITIN CHCC

## 2014-06-01 ENCOUNTER — Ambulatory Visit: Payer: Medicare HMO

## 2014-06-01 NOTE — Progress Notes (Signed)
Hematology and Oncology Follow Up Visit  Gregory Farrell [id number] [birth year] 80 y.o. 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  [name] is back for follow-up. I foresee, he has been hospitalized. He really had a rough hospitalization. He was admitted initially because of dehydration. He had dysphagia and odynophagia. He underwent an upper endoscopy. This showed that he had radiation esophagitis. This finally improved. He was able to eat.  He then had a enterococcus urinary tract infection. He also was found have a thrombus in the right upper extremity. He was placed on anticoagulation. He was discharged. He came home the next day because of a fever.  He then did not want to have anything done. We taught him at length about this. He finally agreed to let us try to help. We try to minimize his medications. he came back in with atrial fibrillation. He currently is on amiodarone for this. I will try to get him down on this if possible.  He is at a rehabilitation center now. He is doing some physical therapy. He is able to walk a little bit.  His underlying cancer is responding. When he had his CT scans done in the hospital, it showed that he had improvement in the locally advanced squamous cell cancer.  He still is quite frail. He is trying. He really got knocked down quite a bit.  Currently, he is not complaining of any pain.   There's no bleeding. There is really no swelling in his right arm.  Overall, his performance status is ECOG 3.  Medications: Current outpatient prescriptions: amiodarone (PACERONE) 400 MG tablet, Take 1 tablet (400 mg total) by mouth daily. Take 400 mg Twice a day for 7 days, then 400 mg daily for 2 weeks, then 200 mg daily., Disp: 60 tablet, Rfl: 0;  atorvastatin (LIPITOR) 80 MG tablet, Take 80 mg by mouth daily., Disp: ,  Rfl: ;  budesonide-formoterol (SYMBICORT) 160-4.5 MCG/ACT inhaler, Inhale 2 puffs into the lungs 2 (two) times daily., Disp: , Rfl:  citalopram (CELEXA) 20 MG tablet, Take 1 tablet (20 mg total) by mouth daily., Disp: , Rfl: ;  feeding supplement, ENSURE, (ENSURE) PUDG, Take 1 Container by mouth 3 (three) times daily between meals as needed (Per pt request)., Disp: 30 Can, Rfl: 0;  insulin detemir (LEVEMIR) 100 UNIT/ML injection, Inject 0.03 mLs (3 Units total) into the skin daily. May increase by 2 units every 3 days as directed, Disp: 10 mL, Rfl: 11 lactulose (CHRONULAC) 10 GM/15ML solution, Take 30 mLs (20 g total) by mouth 2 (two) times daily as needed for mild constipation or moderate constipation., Disp: 500 mL, Rfl: 0;  levalbuterol (XOPENEX) 1.25 MG/0.5ML nebulizer solution, Take 1.25 mg by nebulization every 2 (two) hours as needed for wheezing or shortness of breath., Disp: 1 each, Rfl: 12 pantoprazole sodium (PROTONIX) 40 mg/20 mL PACK, Take 20 mLs (40 mg total) by mouth 2 (two) times daily., Disp: 60 each, Rfl: 0;  Rivaroxaban (XARELTO) 15 MG TABS tablet, Take 1 tablet (15 mg total) by mouth daily with supper., Disp: 30 tablet, Rfl: ;  tamsulosin (FLOMAX) 0.4 MG CAPS capsule, Take 1 capsule (0.4 mg total) by mouth daily after supper., Disp: 30 capsule, Rfl: 0 temazepam (RESTORIL) 22.5 MG capsule, Take 1 capsule (22.5 mg total) by mouth at bedtime as needed for  sleep., Disp: 30 capsule, Rfl: 2;  Wound Cleansers (RADIAPLEX EX), Apply 1 application topically daily. To Neck & Back., Disp: , Rfl: ;  dronabinol (MARINOL) 2.5 MG capsule, Take 1 capsule (2.5 mg total) by mouth 2 (two) times daily before a meal., Disp: 60 capsule, Rfl: 0 ondansetron (ZOFRAN) 8 MG tablet, Take 8 mg by mouth every 8 (eight) hours as needed for nausea or vomiting. Start after Chemo, Disp: , Rfl: ;  prochlorperazine (COMPAZINE) 10 MG tablet, Take 10 mg by mouth every 6 (six) hours as needed for nausea or vomiting., Disp: , Rfl:    Current facility-administered medications: sodium chloride 0.9 % injection 10 mL, 10 mL, Intravenous, PRN, Volanda Napoleon, MD, 10 mL at 05/27/14 1638  Allergies:  Allergies  Allergen Reactions  . Exenatide     REACTION: nausea    Past Medical History, Surgical history, Social history, and Family History were reviewed and updated.  Review of Systems: As above  Physical Exam:  oral temperature is 98.1 F (36.7 C). His blood pressure is 118/53 and his pulse is 74. His respiration is 16.   Elderly, chronically ill-appearing gentleman. His head and neck exam shows no ocular or oral lesions. He has no palpable cervical or supraclavicular lymph nodes. His lungs are clear. Cardiac exam is regular rate and rhythm. He has an occasional extra beat. He has no murmurs rubs or bruits. Abdomen is soft. Has good bowel sounds. There is no fluid wave. There is no palpable liver or spleen tip. Extremities shows some mild nonpitting edema of the right arm. He has some trace edema in his legs. Skin exam shows no ecchymoses or petechia. Neurological exam is nonfocal.  Lab Results  Component Value Date   WBC 4.0 05/27/2014   HGB 9.9* 05/27/2014   HCT 31.1* 05/27/2014   MCV 101* 05/27/2014   PLT 36* 05/27/2014     Chemistry      Component Value Date/Time   NA 137 05/27/2014 1331   NA 140 05/22/2014 0548   NA 136 04/12/2014 1317   K 3.6 05/27/2014 1331   K 4.0 05/22/2014 0548   K 4.6 04/12/2014 1317   CL 98 05/27/2014 1331   CL 105 05/22/2014 0548   CO2 24 05/27/2014 1331   CO2 25 05/22/2014 0548   CO2 26 04/12/2014 1317   BUN 10 05/27/2014 1331   BUN 10 05/22/2014 0548   BUN 22.3 04/12/2014 1317   CREATININE 1.7* 05/27/2014 1331   CREATININE 1.40* 05/22/2014 0548   CREATININE 1.5* 04/12/2014 1317      Component Value Date/Time   CALCIUM 8.4 05/27/2014 1331   CALCIUM 8.2* 05/22/2014 0548   CALCIUM 9.2 04/12/2014 1317   ALKPHOS 81 05/27/2014 1331   ALKPHOS 84 05/20/2014 0849   AST 21  05/27/2014 1331   AST 19 05/20/2014 0849   ALT 16 05/27/2014 1331   ALT 15 05/20/2014 0849   BILITOT 0.60 05/27/2014 1331   BILITOT 0.4 05/20/2014 0849         Impression and Plan: Mr. Zeman is 80 year old gentleman. He had radiation chemotherapy for his locally advanced squamous cell carcinoma. He had low-dose carboplatinum and Taxol. He has not had any therapy now for about 6 weeks.  I'm not sure why his platelet count is low. It might be one of his medicines. We will have to follow this along. Again, it might be a medication that he is on. I would not think that is from any chemotherapy since  he has not had chemotherapy and had only low-dose chemotherapy.  I really hope that he does get to feel better. I really want him to be able to go home.  His daughter has been fantastic. She really has helped as much as possible.  We will see if they can check his blood at the rehabilitation center. I will see him back in about 3 weeks.  We did give him some IV fluids while he was here.  We spent about 45 minutes with him.   Volanda Napoleon, MD 11/18/20155:02 PM

## 2014-06-02 ENCOUNTER — Encounter: Payer: Self-pay | Admitting: Nurse Practitioner

## 2014-06-02 NOTE — Progress Notes (Signed)
Spoke with Arrie Aran, LPN at [facility] [phone]). Pt has been moved to room 218.

## 2014-06-04 ENCOUNTER — Encounter: Payer: Self-pay | Admitting: Radiation Oncology

## 2014-06-04 NOTE — Progress Notes (Signed)
  Radiation Oncology         ([phone]) [phone] ________________________________  Name: Gregory Farrell MRN: [id number]  Date: 06/04/2014  DOB: 1932/11/03  End of Treatment Note  Diagnosis:   Lung cancer, hilus   Staging form: Lung, AJCC 7th Edition     Clinical: Stage IIIA (T3, N2, M0) -       Indication for treatment:  Definitive treatment along with radiosensitizing chemotherapy       Radiation treatment dates:   September 10 through October 23  Site/dose:   Left infrahilar region,  45 gray in 25 fractions  Beams/energy:   3-D conformal using a 3 field arrangement with 15 and 10 beams, RAO LAO LPO  Narrative: The patient completed an abbreviated course of radiation therapy as part of his management. He initially tolerated his treatments well however during the latter portion of his treatments he developed recurrent problems with atrial fibrillation with rapid ventricular response requiring admission. Patient also developed radiation esophagitis and right upper extremity DVT. In light of these issues the patient became quite fatigued and the patient and his family elected not to proceed with additional radiation therapy. Patient was scheduled to receive 63 gray initially.  On CT scan October 24 patient was noted to have improvement of his left hilar/infrahilar obstructing mass with improved aeration and decreasing atelectasis in the left lower lung.  Plan: routine followup in one month  -----------------------------------  Blair Promise, PhD, MD

## 2014-06-08 ENCOUNTER — Encounter: Payer: Self-pay | Admitting: Internal Medicine

## 2014-06-08 ENCOUNTER — Non-Acute Institutional Stay (SKILLED_NURSING_FACILITY): Payer: Medicare HMO | Admitting: Internal Medicine

## 2014-06-08 DIAGNOSIS — N179 Acute kidney failure, unspecified: Secondary | ICD-10-CM | POA: Diagnosis not present

## 2014-06-08 DIAGNOSIS — D696 Thrombocytopenia, unspecified: Secondary | ICD-10-CM

## 2014-06-08 DIAGNOSIS — E1121 Type 2 diabetes mellitus with diabetic nephropathy: Secondary | ICD-10-CM

## 2014-06-08 DIAGNOSIS — J439 Emphysema, unspecified: Secondary | ICD-10-CM

## 2014-06-08 DIAGNOSIS — N183 Chronic kidney disease, stage 3 unspecified: Secondary | ICD-10-CM

## 2014-06-08 DIAGNOSIS — I82621 Acute embolism and thrombosis of deep veins of right upper extremity: Secondary | ICD-10-CM

## 2014-06-08 DIAGNOSIS — I480 Paroxysmal atrial fibrillation: Secondary | ICD-10-CM | POA: Diagnosis not present

## 2014-06-08 DIAGNOSIS — R531 Weakness: Secondary | ICD-10-CM | POA: Diagnosis not present

## 2014-06-08 DIAGNOSIS — C3401 Malignant neoplasm of right main bronchus: Secondary | ICD-10-CM

## 2014-06-08 MED ORDER — LINAGLIPTIN 5 MG PO TABS: 5.0000 mg | ORAL_TABLET | Freq: Every day | ORAL | Status: DC

## 2014-06-08 NOTE — Progress Notes (Signed)
Patient ID: Gregory Farrell, male   DOB: [birth year], 80 y.o.   MRN: [id number]  Location:  [facility] [place] Mariea Clonts, D.O., C.M.D.  PCP: [name], MD  Code Status: DNR;  MOST was completed at hospital and again at SNF indicating DNR, comfort measures, antibiotics if needed, IVF if needed, no feeding tube during conversation with the majority of the patient's reasonably available adult children (signed by Brooks Sailors, his daughter and health care agent)  Allergies  Allergen Reactions  . Exenatide     REACTION: nausea    Chief Complaint  Patient presents with  . Discharge Note    to home    HPI:  80 yo male with h/o lung cancer here for short term rehab s/p hospitalization 11/3-11/9 with sepsis secondary to enterococcus UTI.  He had previously been hospitalized with failure to thrive, radiation esophagitis, p afib, renal failure and a new diagnosis of RUE DVT for which he was put on eliquis.   His sepsis was initially treated with vanc and zosyn and narrowed to unasyn.  He improved and completed 5 more days of abx here with levaquin 250mg .    In terms of his DVT and pafib, he was sent home on eliquis last hospitalization and his family requested a change during this last admission due to hematuria and thrombocytopenia.  (Was changed to xarelto per Dr. Antonieta Pert advice).   He was also put on amiodarone 400mg  which was then reduced to  200mg  due to sinus rhythm--needs to f/u in 2 wks from discharge from here with cardiology (Dr. Stanford Breed).  I do not see the results in his paper snf chart for the f/u cbc and bmp that were recommended when she left the hospital--these will need to be done when he sees Dr. Charlett Blake in f/u.    He remains on PPI for his radiation esophagitis, but appetite improved and swallowing well.  Carafate was stopped by Dr. Marin Olp 11/13.  Marinol was added for appetite stimulation.  Lactulose was adjusted for constipation.    Renal failure at hospital d/c  had improved to 1.4. Again, it appears the repeat never got ordered here.     He continues to follow with Dr. Marin Olp re: his squamous cell lung cancer.  Management is palliative without any further chemo or xrt.    He was also started on restoril for sleep at the hospital.    Review of Systems:  Review of Systems  Constitutional: Positive for weight loss and malaise/fatigue. Negative for fever.  Respiratory: Positive for cough and shortness of breath.   Cardiovascular: Negative for chest pain.  Gastrointestinal: Positive for heartburn and constipation.       Some dysphagia, radiation esophagitis  Neurological: Positive for weakness. Negative for dizziness.  Psychiatric/Behavioral: The patient has insomnia.     Past Medical History  Diagnosis Date  . Hypertension   . COPD (chronic obstructive pulmonary disease)   . GERD (gastroesophageal reflux disease)   . Renal insufficiency   . Hyperlipidemia   . CAD (coronary artery disease)   . B12 deficiency   . Memory loss   . Vertigo   . Diabetes mellitus     type II, poly neuropathy  . Overweight 01/31/2013  . Lung cancer, hilus 03/22/2014    Past Surgical History  Procedure Laterality Date  . Coronary angioplasty with stent placement      more than 10 years ago  . Inguinal hernia repair  2010  . Video bronchoscopy  Bilateral 03/07/2014    Procedure: VIDEO BRONCHOSCOPY WITHOUT FLUORO;  Surgeon: Kathee Delton, MD;  Location: [facility];  Service: Cardiopulmonary;  Laterality: Bilateral;  . Esophagogastroduodenoscopy N/A 05/12/2014    Procedure: ESOPHAGOGASTRODUODENOSCOPY (EGD);  Surgeon: Arta Silence, MD;  Location: [facility];  Service: Endoscopy;  Laterality: N/A;    Social History:   reports that he quit smoking about 11 years ago. His smoking use included Cigarettes. He started smoking about 31 years ago. He has a 20 pack-year smoking history. He has never used smokeless tobacco. He reports that he does not drink alcohol.  His drug history is not on file.  Family History  Problem Relation Age of Onset  . Coronary artery disease Mother   . Breast cancer Mother   . Diabetes Mother   . Leukemia Brother     Medications: Patient's Medications  New Prescriptions   No medications on file  Previous Medications   AMIODARONE (PACERONE) 400 MG TABLET    Take 1 tablet (400 mg total) by mouth daily. Take 400 mg Twice a day for 7 days, then 400 mg daily for 2 weeks, then 200 mg daily.   ATORVASTATIN (LIPITOR) 80 MG TABLET    Take 80 mg by mouth daily.   BUDESONIDE-FORMOTEROL (SYMBICORT) 160-4.5 MCG/ACT INHALER    Inhale 2 puffs into the lungs 2 (two) times daily.   CITALOPRAM (CELEXA) 20 MG TABLET    Take 1 tablet (20 mg total) by mouth daily.   DRONABINOL (MARINOL) 2.5 MG CAPSULE    Take 1 capsule (2.5 mg total) by mouth 2 (two) times daily before a meal.   FEEDING SUPPLEMENT, ENSURE, (ENSURE) PUDG    Take 1 Container by mouth 3 (three) times daily between meals as needed (Per pt request).   INSULIN DETEMIR (LEVEMIR) 100 UNIT/ML INJECTION    Inject 0.03 mLs (3 Units total) into the skin daily. May increase by 2 units every 3 days as directed   LACTULOSE (CHRONULAC) 10 GM/15ML SOLUTION    Take 30 mLs (20 g total) by mouth 2 (two) times daily as needed for mild constipation or moderate constipation.   LEVALBUTEROL (XOPENEX) 1.25 MG/0.5ML NEBULIZER SOLUTION    Take 1.25 mg by nebulization every 2 (two) hours as needed for wheezing or shortness of breath.   ONDANSETRON (ZOFRAN) 8 MG TABLET    Take 8 mg by mouth every 8 (eight) hours as needed for nausea or vomiting. Start after Chemo   PANTOPRAZOLE SODIUM (PROTONIX) 40 MG/20 ML PACK    Take 20 mLs (40 mg total) by mouth 2 (two) times daily.   PROCHLORPERAZINE (COMPAZINE) 10 MG TABLET    Take 10 mg by mouth every 6 (six) hours as needed for nausea or vomiting.   RIVAROXABAN (XARELTO) 15 MG TABS TABLET    Take 1 tablet (15 mg total) by mouth daily with supper.   TAMSULOSIN  (FLOMAX) 0.4 MG CAPS CAPSULE    Take 1 capsule (0.4 mg total) by mouth daily after supper.   TEMAZEPAM (RESTORIL) 22.5 MG CAPSULE    Take 1 capsule (22.5 mg total) by mouth at bedtime as needed for sleep.   WOUND CLEANSERS (RADIAPLEX EX)    Apply 1 application topically daily. To Neck & Back.  Modified Medications   No medications on file  Discontinued Medications   No medications on file    Physical Exam: Filed Vitals:   06/08/14 1124  BP: 136/70  Pulse: 75  Temp: 98.6 F (37 C)  Resp:  20  Height: 5\' 9"  (1.753 m)  Weight: 187 lb (84.823 kg)  SpO2: 98%  Physical Exam  Constitutional: He is oriented to person, place, and time. No distress.  Cardiovascular: Normal rate and regular rhythm.   Pulmonary/Chest:  Coarse rhonchi  Abdominal: Soft. Bowel sounds are normal. He exhibits no distension. There is no tenderness.  Musculoskeletal: Normal range of motion.  Neurological: He is alert and oriented to person, place, and time.  Skin: Skin is warm and dry.  Psychiatric: He has a normal mood and affect.    Labs reviewed: Basic Metabolic Panel:  Recent Labs  07/06/13 1110 10/20/13 1213 02/11/14 0923  05/08/14 0355  05/20/14 0849 05/22/14 0548 05/27/14 1331  NA 137 138 139  < > 136*  < > 138 140 137  K 5.0 4.7 4.9  < > 4.9  < > 4.3 4.0 3.6  CL 102 102 103  < > 101  < > 101 105 98  CO2 24 27 27   < > 26  < > 26 25 24   GLUCOSE 233* 166* 136*  < > 164*  < > 104* 121* 224*  BUN 25* 22 19  < > 17  < > 16 10 10   CREATININE 1.73* 1.63* 1.80*  < > 1.62*  < > 1.60* 1.40* 1.7*  CALCIUM 9.2 8.8 9.4  < > 8.0*  < > 8.3* 8.2* 8.4  MG  --   --   --   --  2.1  --   --   --   --   PHOS 3.1 2.9 3.2  --   --   --   --   --   --   < > = values in this interval not displayed. Liver Function Tests:  Recent Labs  05/18/14 0430 05/19/14 0135 05/20/14 0849 05/27/14 1331  AST 15 16 19 21   ALT 12 11 15 16   ALKPHOS 90 101 84 81  BILITOT 0.7 0.4 0.4 0.60  PROT 4.8* 5.0* 5.2* 5.9*  ALBUMIN  1.7* 1.8* 1.8*  --    No results for input(s): LIPASE, AMYLASE in the last 8760 hours. No results for input(s): AMMONIA in the last 8760 hours. CBC:  Recent Labs  05/18/14 0430 05/19/14 0135  05/22/14 0548 05/23/14 0455 05/27/14 1331  WBC 11.0* 8.6  < > 5.1 4.8 4.0  NEUTROABS 9.0* 7.1  --   --   --  2.9  HGB 9.9* 9.8*  < > 10.0* 9.6* 9.9*  HCT 30.9* 30.5*  < > 31.7* 29.9* 31.1*  MCV 97.5 96.5  < > 97.5 97.7 101*  PLT 65* 69*  < > 59* 50* 36*  < > = values in this interval not displayed. Cardiac Enzymes:  Recent Labs  05/07/14 1609 05/07/14 2135 05/08/14 0355  TROPONINI <0.30 <0.30 <0.30   BNP: Invalid input(s): POCBNP CBG:  Recent Labs  05/23/14 0733 05/23/14 1136 05/23/14 1741  GLUCAP 105* 155* 153*   Procedures and Imaging Studies:   05/07/14:  CXR:  No acute cardiopulmonary disease 05/07/14:  CT angio PE w/o and w/o contrast:  Improving left hilar/ infrahilar obstructing mass. Mild abnormal soft tissue continues does surround the left lower lobe bronchus, but there has improved aeration with decreasing atelectasis in the left lower lung. Stable small/borderline size left hilar lymph node.  Stable posterior subpleural nodule in the left upper lobe, 3 mm. Right basilar atelectasis. Severe coronary artery disease. Stable hiatal hernia. 05/17/14:  CXR:  Small bilateral pleural  effusions and mild cardiomegaly.  Assessment/Plan:   1. Malignant neoplasm of hilus of right lung -keep f/u with Dr. Marin Olp -palliative care  -see MOST form  2. Pulmonary emphysema, unspecified emphysema type -cont xopenex nebs and symbicort  3. DVT of upper extremity (deep vein thrombosis), right -cont xarelto -f/u cbc  4. Paroxysmal atrial fibrillation -cont 200mg  amiodarone to maintain sinus rhythm and xarelto blood thinner -f/u cbc, bmp  5. Type 2 diabetes mellitus with diabetic nephropathy -was sent here on 3 units of levemir only with intent to uptitrate if needed--this also  was not done -will d/c insulin before goes home and start on tradjenta 5mg  daily    Lab Results  Component Value Date   HGBA1C 7.1* 05/18/2014    6. Acute renal failure superimposed on stage 3 chronic kidney disease -resolved before d/c here for rehab -recommend f/u bmp at PCP f/u appt  7. Thrombocytopenia -will need f/u cbc with diff at PCP f/u appt  8. Generalized weakness -completed his SNF PT, OT; now going home with continued [facility] PT, OT  Patient is being discharged with home health services:  PT, OT  Patient is being discharged with the following durable medical equipment:    Patient has been advised to f/u with their PCP in 1-2 weeks to bring them up to date on their rehab stay.  They were provided with a 30 day supply of scripts for prescription medications and refills must be obtained from their PCP.    Labs/tests ordered:  Recommend cbc, bmp that never got done at [facility]

## 2014-06-10 ENCOUNTER — Encounter: Payer: Self-pay | Admitting: Family

## 2014-06-10 ENCOUNTER — Ambulatory Visit (HOSPITAL_BASED_OUTPATIENT_CLINIC_OR_DEPARTMENT_OTHER): Payer: Medicare HMO

## 2014-06-10 ENCOUNTER — Ambulatory Visit (HOSPITAL_BASED_OUTPATIENT_CLINIC_OR_DEPARTMENT_OTHER): Payer: Medicare HMO | Admitting: Family

## 2014-06-10 ENCOUNTER — Other Ambulatory Visit (HOSPITAL_BASED_OUTPATIENT_CLINIC_OR_DEPARTMENT_OTHER): Payer: Medicare HMO | Admitting: Lab

## 2014-06-10 VITALS — BP 110/52 | HR 76 | Temp 98.0°F | Resp 20 | Wt 187.0 lb

## 2014-06-10 DIAGNOSIS — R5383 Other fatigue: Secondary | ICD-10-CM

## 2014-06-10 DIAGNOSIS — R630 Anorexia: Secondary | ICD-10-CM

## 2014-06-10 DIAGNOSIS — E119 Type 2 diabetes mellitus without complications: Secondary | ICD-10-CM

## 2014-06-10 DIAGNOSIS — C3402 Malignant neoplasm of left main bronchus: Secondary | ICD-10-CM

## 2014-06-10 DIAGNOSIS — C3401 Malignant neoplasm of right main bronchus: Secondary | ICD-10-CM

## 2014-06-10 DIAGNOSIS — E1121 Type 2 diabetes mellitus with diabetic nephropathy: Secondary | ICD-10-CM

## 2014-06-10 DIAGNOSIS — R7989 Other specified abnormal findings of blood chemistry: Secondary | ICD-10-CM

## 2014-06-10 DIAGNOSIS — E291 Testicular hypofunction: Secondary | ICD-10-CM

## 2014-06-10 LAB — CBC WITH DIFFERENTIAL (CANCER CENTER ONLY)
BASO#: 0 10*3/uL (ref 0.0–0.2)
BASO%: 0.2 % (ref 0.0–2.0)
EOS%: 4.1 % (ref 0.0–7.0)
Eosinophils Absolute: 0.2 10*3/uL (ref 0.0–0.5)
HCT: 32 % — ABNORMAL LOW (ref 38.7–49.9)
HEMOGLOBIN: 10.4 g/dL — AB (ref 13.0–17.1)
LYMPH#: 1.7 10*3/uL (ref 0.9–3.3)
LYMPH%: 30.9 % (ref 14.0–48.0)
MCH: 32.8 pg (ref 28.0–33.4)
MCHC: 32.5 g/dL (ref 32.0–35.9)
MCV: 101 fL — ABNORMAL HIGH (ref 82–98)
MONO#: 0.8 10*3/uL (ref 0.1–0.9)
MONO%: 13.5 % — ABNORMAL HIGH (ref 0.0–13.0)
NEUT#: 2.9 10*3/uL (ref 1.5–6.5)
NEUT%: 51.3 % (ref 40.0–80.0)
Platelets: 147 10*3/uL (ref 145–400)
RBC: 3.17 10*6/uL — ABNORMAL LOW (ref 4.20–5.70)
RDW: 19.9 % — ABNORMAL HIGH (ref 11.1–15.7)
WBC: 5.6 10*3/uL (ref 4.0–10.0)

## 2014-06-10 LAB — CHCC SATELLITE - SMEAR

## 2014-06-10 LAB — CMP (CANCER CENTER ONLY)
ALT(SGPT): 17 U/L (ref 10–47)
AST: 23 U/L (ref 11–38)
Albumin: 2.3 g/dL — ABNORMAL LOW (ref 3.3–5.5)
Alkaline Phosphatase: 90 U/L — ABNORMAL HIGH (ref 26–84)
BILIRUBIN TOTAL: 0.9 mg/dL (ref 0.20–1.60)
BUN, Bld: 16 mg/dL (ref 7–22)
CALCIUM: 8.8 mg/dL (ref 8.0–10.3)
CHLORIDE: 102 meq/L (ref 98–108)
CO2: 28 meq/L (ref 18–33)
Creat: 1.4 mg/dl — ABNORMAL HIGH (ref 0.6–1.2)
GLUCOSE: 189 mg/dL — AB (ref 73–118)
Potassium: 4.4 mEq/L (ref 3.3–4.7)
SODIUM: 143 meq/L (ref 128–145)
TOTAL PROTEIN: 6.6 g/dL (ref 6.4–8.1)

## 2014-06-10 MED ORDER — DRONABINOL 2.5 MG PO CAPS: 5.0000 mg | ORAL_CAPSULE | Freq: Two times a day (BID) | ORAL | Status: DC

## 2014-06-10 MED ORDER — SODIUM CHLORIDE 0.9 % IV SOLN
Freq: Once | INTRAVENOUS | Status: AC
  Administered 2014-06-10: 14:00:00 via INTRAVENOUS

## 2014-06-10 MED ORDER — DRONABINOL 2.5 MG PO CAPS: 2.5000 mg | ORAL_CAPSULE | Freq: Three times a day (TID) | ORAL | Status: DC

## 2014-06-10 MED ORDER — TESTOSTERONE CYPIONATE 200 MG/ML IM SOLN
400.0000 mg | Freq: Once | INTRAMUSCULAR | Status: AC
  Administered 2014-06-10: 400 mg via INTRAMUSCULAR

## 2014-06-10 MED ORDER — TESTOSTERONE CYPIONATE 200 MG/ML IM SOLN
INTRAMUSCULAR | Status: AC
  Filled 2014-06-10: qty 2

## 2014-06-10 MED ORDER — SODIUM CHLORIDE 0.9 % IJ SOLN
10.0000 mL | INTRAMUSCULAR | Status: DC | PRN
  Administered 2014-06-10: 10 mL via INTRAVENOUS
  Filled 2014-06-10: qty 10

## 2014-06-10 MED ORDER — HEPARIN SOD (PORK) LOCK FLUSH 100 UNIT/ML IV SOLN
500.0000 [IU] | Freq: Once | INTRAVENOUS | Status: AC
  Administered 2014-06-10: 500 [IU] via INTRAVENOUS
  Filled 2014-06-10: qty 5

## 2014-06-10 NOTE — Patient Instructions (Signed)
Dehydration, Adult Dehydration is when you lose more fluids from the body than you take in. Vital organs like the kidneys, brain, and heart cannot function without a proper amount of fluids and salt. Any loss of fluids from the body can cause dehydration.  CAUSES   Vomiting.  Diarrhea.  Excessive sweating.  Excessive urine output.  Fever. SYMPTOMS  Mild dehydration  Thirst.  Dry lips.  Slightly dry mouth. Moderate dehydration  Very dry mouth.  Sunken eyes.  Skin does not bounce back quickly when lightly pinched and released.  Dark urine and decreased urine production.  Decreased tear production.  Headache. Severe dehydration  Very dry mouth.  Extreme thirst.  Rapid, weak pulse (more than 100 beats per minute at rest).  Cold hands and feet.  Not able to sweat in spite of heat and temperature.  Rapid breathing.  Blue lips.  Confusion and lethargy.  Difficulty being awakened.  Minimal urine production.  No tears. DIAGNOSIS  Your caregiver will diagnose dehydration based on your symptoms and your exam. Blood and urine tests will help confirm the diagnosis. The diagnostic evaluation should also identify the cause of dehydration. TREATMENT  Treatment of mild or moderate dehydration can often be done at home by increasing the amount of fluids that you drink. It is best to drink small amounts of fluid more often. Drinking too much at one time can make vomiting worse. Refer to the home care instructions below. Severe dehydration needs to be treated at the hospital where you will probably be given intravenous (IV) fluids that contain water and electrolytes. HOME CARE INSTRUCTIONS   Ask your caregiver about specific rehydration instructions.  Drink enough fluids to keep your urine clear or pale yellow.  Drink small amounts frequently if you have nausea and vomiting.  Eat as you normally do.  Avoid:  Foods or drinks high in sugar.  Carbonated  drinks.  Juice.  Extremely hot or cold fluids.  Drinks with caffeine.  Fatty, greasy foods.  Alcohol.  Tobacco.  Overeating.  Gelatin desserts.  Wash your hands well to avoid spreading bacteria and viruses.  Only take over-the-counter or prescription medicines for pain, discomfort, or fever as directed by your caregiver.  Ask your caregiver if you should continue all prescribed and over-the-counter medicines.  Keep all follow-up appointments with your caregiver. SEEK MEDICAL CARE IF:  You have abdominal pain and it increases or stays in one area (localizes).  You have a rash, stiff neck, or severe headache.  You are irritable, sleepy, or difficult to awaken.  You are weak, dizzy, or extremely thirsty. SEEK IMMEDIATE MEDICAL CARE IF:   You are unable to keep fluids down or you get worse despite treatment.  You have frequent episodes of vomiting or diarrhea.  You have blood or green matter (bile) in your vomit.  You have blood in your stool or your stool looks black and tarry.  You have not urinated in 6 to 8 hours, or you have only urinated a small amount of very dark urine.  You have a fever.  You faint. MAKE SURE YOU:   Understand these instructions.  Will watch your condition.  Will get help right away if you are not doing well or get worse. Document Released: 07/01/2005 Document Revised: 09/23/2011 Document Reviewed: 02/18/2011 ExitCare Patient Information 2015 [employer]. This information is not intended to replace advice given to you by your health care provider. Make sure you discuss any questions you have with your health care   provider.  

## 2014-06-10 NOTE — Progress Notes (Signed)
Gregory Farrell  [phone] [phone]  ID: Gregory Farrell OB: 01-07-1933 MR#: [id number]#:[id number] Patient Care Team: Mosie Lukes, MD as PCP - General (Family Medicine) Elsie Stain, MD as Attending Physician (Pulmonary Disease)  DIAGNOSIS: Squamous cell carcinoma of the left lung-likely stage IIIB  INTERVAL HISTORY: Gregory Farrell is here today for follow-up and fluids. He is not feeling well. He is very weak and has no appetite. He is not drinking much fluid either. He is taking his Marinol but it does not seem to be helping.  He still has some SOB with exertion. He denies fever, chills, cough, rash, n/v, headache, chest pain, palpitations, abdominal pain, constipation, diarrhea, blood in urine or stool. He has had no bleeding or pain.  He has had no swelling, tenderness, numbness or tingling in his extremities.  His daughter is concerned with him taking amiodarone with his current lung function. She feels that his breathing is worse and would like him to get off it.  She also has questions about his treatment for diabetes. There was some confusion after he left the SNF. She is trying to get in touch with Dr. Azalee Course office to find out about this.   CURRENT TREATMENT: Observation  REVIEW OF SYSTEMS: All other 10 point review of systems is negative except for those issues mentioned above.  PAST MEDICAL HISTORY: Past Medical History  Diagnosis Date  . Hypertension   . COPD (chronic obstructive pulmonary disease)   . GERD (gastroesophageal reflux disease)   . Renal insufficiency   . Hyperlipidemia   . CAD (coronary artery disease)   . B12 deficiency   . Memory loss   . Vertigo   . Diabetes mellitus     type II, poly neuropathy  . Overweight 01/31/2013  . Lung cancer, hilus 03/22/2014   PAST SURGICAL HISTORY: Past Surgical History  Procedure Laterality Date  . Coronary angioplasty with stent placement      more than 10 years ago  . Inguinal  hernia repair  2010  . Video bronchoscopy Bilateral 03/07/2014    Procedure: VIDEO BRONCHOSCOPY WITHOUT FLUORO;  Surgeon: Kathee Delton, MD;  Location: [facility];  Service: Cardiopulmonary;  Laterality: Bilateral;  . Esophagogastroduodenoscopy N/A 05/12/2014    Procedure: ESOPHAGOGASTRODUODENOSCOPY (EGD);  Surgeon: Arta Silence, MD;  Location: [facility];  Service: Endoscopy;  Laterality: N/A;   FAMILY HISTORY Family History  Problem Relation Age of Onset  . Coronary artery disease Mother   . Breast cancer Mother   . Diabetes Mother   . Leukemia Brother    GYNECOLOGIC HISTORY:  No LMP for male patient.   SOCIAL HISTORY:  History   Social History  . Marital Status: Married    Spouse Name: N/A    Number of Children: 1  . Years of Education: N/A   Occupational History  . Retired     [employer]   Social History Main Topics  . Smoking status: Former Smoker -- 1.00 packs/day for 20 years    Types: Cigarettes    Start date: 09/09/1982    Quit date: 07/15/2002  . Smokeless tobacco: Never Used     Comment: quit smoking 11 years ago  . Alcohol Use: No  . Drug Use: Not on file  . Sexual Activity: Not on file   Other Topics Concern  . Not on file   Social History Narrative   ADVANCED DIRECTIVES: <no information>  HEALTH MAINTENANCE: History  Substance Use Topics  .  Smoking status: Former Smoker -- 1.00 packs/day for 20 years    Types: Cigarettes    Start date: 09/09/1982    Quit date: 07/15/2002  . Smokeless tobacco: Never Used     Comment: quit smoking 11 years ago  . Alcohol Use: No   Colonoscopy: PAP: Bone density: Lipid panel:  Allergies  Allergen Reactions  . Exenatide     REACTION: nausea    Current Outpatient Prescriptions  Medication Sig Dispense Refill  . amiodarone (PACERONE) 400 MG tablet Take 1 tablet (400 mg total) by mouth daily. Take 400 mg Twice a day for 7 days, then 400 mg daily for 2 weeks, then 200 mg daily. (Patient taking  differently: Take 200 mg by mouth daily. ) 60 tablet 0  . atorvastatin (LIPITOR) 80 MG tablet Take 80 mg by mouth daily.    . budesonide-formoterol (SYMBICORT) 160-4.5 MCG/ACT inhaler Inhale 2 puffs into the lungs 2 (two) times daily.    . citalopram (CELEXA) 20 MG tablet Take 1 tablet (20 mg total) by mouth daily. (Patient taking differently: Take 20 mg by mouth 2 (two) times daily. )    . dronabinol (MARINOL) 2.5 MG capsule Take 1 capsule (2.5 mg total) by mouth 2 (two) times daily before a meal. 60 capsule 0  . lactulose (CHRONULAC) 10 GM/15ML solution Take 30 mLs (20 g total) by mouth 2 (two) times daily as needed for mild constipation or moderate constipation. 500 mL 0  . levalbuterol (XOPENEX) 1.25 MG/0.5ML nebulizer solution Take 1.25 mg by nebulization every 2 (two) hours as needed for wheezing or shortness of breath. 1 each 12  . linagliptin (TRADJENTA) 5 MG TABS tablet Take 1 tablet (5 mg total) by mouth daily. With largest meal for diabetes 30 tablet 3  . Rivaroxaban (XARELTO) 15 MG TABS tablet Take 1 tablet (15 mg total) by mouth daily with supper. 30 tablet   . tamsulosin (FLOMAX) 0.4 MG CAPS capsule Take 1 capsule (0.4 mg total) by mouth daily after supper. 30 capsule 0  . temazepam (RESTORIL) 22.5 MG capsule Take 1 capsule (22.5 mg total) by mouth at bedtime as needed for sleep. 30 capsule 2  . ondansetron (ZOFRAN) 8 MG tablet Take 8 mg by mouth every 8 (eight) hours as needed for nausea or vomiting. Start after Chemo    . pantoprazole sodium (PROTONIX) 40 mg/20 mL PACK Take 20 mLs (40 mg total) by mouth 2 (two) times daily. (Patient taking differently: Take 40 mg by mouth daily. ) 60 each 0  . prochlorperazine (COMPAZINE) 10 MG tablet Take 10 mg by mouth every 6 (six) hours as needed for nausea or vomiting.    . Wound Cleansers (RADIAPLEX EX) Apply 1 application topically daily. To Neck & Back.     No current facility-administered medications for this visit.   OBJECTIVE: Filed  Vitals:   06/10/14 1341  BP: 110/52  Pulse: 76  Temp: 98 F (36.7 C)  Resp: 20   Body mass index is 27.6 kg/(m^2). ECOG FS:1 - Symptomatic but completely ambulatory Ocular: Sclerae unicteric, pupils equal, round and reactive to light Ear-nose-throat: Oropharynx clear, dentition fair Lymphatic: No cervical or supraclavicular adenopathy Lungs no rales or rhonchi, good excursion bilaterally Heart regular rate and rhythm, no murmur appreciated Abd soft, nontender, positive bowel sounds MSK no focal spinal tenderness, no joint edema Neuro: non-focal, well-oriented, appropriate affect  LAB RESULTS: CMP     Component Value Date/Time   NA 143 06/10/2014 1304   NA 140  05/22/2014 0548   NA 136 04/12/2014 1317   K 4.4 06/10/2014 1304   K 4.0 05/22/2014 0548   K 4.6 04/12/2014 1317   CL 102 06/10/2014 1304   CL 105 05/22/2014 0548   CO2 28 06/10/2014 1304   CO2 25 05/22/2014 0548   CO2 26 04/12/2014 1317   GLUCOSE 189* 06/10/2014 1304   GLUCOSE 121* 05/22/2014 0548   GLUCOSE 158* 04/12/2014 1317   BUN 16 06/10/2014 1304   BUN 10 05/22/2014 0548   BUN 22.3 04/12/2014 1317   CREATININE 1.4* 06/10/2014 1304   CREATININE 1.40* 05/22/2014 0548   CREATININE 1.5* 04/12/2014 1317   CALCIUM 8.8 06/10/2014 1304   CALCIUM 8.2* 05/22/2014 0548   CALCIUM 9.2 04/12/2014 1317   PROT 6.6 06/10/2014 1304   PROT 5.2* 05/20/2014 0849   ALBUMIN 1.8* 05/20/2014 0849   AST 23 06/10/2014 1304   AST 19 05/20/2014 0849   ALT 17 06/10/2014 1304   ALT 15 05/20/2014 0849   ALKPHOS 90* 06/10/2014 1304   ALKPHOS 84 05/20/2014 0849   BILITOT 0.90 06/10/2014 1304   BILITOT 0.4 05/20/2014 0849   GFRNONAA 46* 05/22/2014 0548   GFRAA 54* 05/22/2014 0548   No results found for: SPEP Lab Results  Component Value Date   WBC 5.6 06/10/2014   NEUTROABS 2.9 06/10/2014   HGB 10.4* 06/10/2014   HCT 32.0* 06/10/2014   MCV 101* 06/10/2014   PLT 147 Platelet count consistent in citrate 06/10/2014   No  results found for: LABCA2 No components found for: TMHDQ222 No results for input(s): INR in the last 168 hours. STUDIES: None  ASSESSMENT/PLAN: Gregory Farrell is a 80 year old gentleman with locally advanced, inoperable non-small cell lung cancer of the left lung. This is squamous cell carcinoma. He is not feeling well. He is very tired and has no appetite.  We will increase his Marinol to TID.  We will give him fluids today. We will stop his amiodarone, xarelto and lipitor. He will follow-up with Dr. Randel Pigg on Monday regarding his blood sugars and insulin regimen. For now he will keep a record of his blood sugars in the morning and before bedtime. We will give him Testosterone 400 mg today.   His CBC and CMP today were ok.  We will see him back for a follow-up and labs in 2 weeks.  He knows to call here with any questions or concerns and to go to the ED in the event of an emergency. We can certainly see him sooner if need be.  Eliezer Bottom, NP 06/10/2014 2:15 PM

## 2014-06-11 LAB — PREALBUMIN: Prealbumin: 8.7 mg/dL — ABNORMAL LOW (ref 17.0–34.0)

## 2014-06-13 ENCOUNTER — Telehealth: Payer: Self-pay | Admitting: Family Medicine

## 2014-06-13 NOTE — Telephone Encounter (Signed)
Can you please triage this

## 2014-06-13 NOTE — Telephone Encounter (Signed)
Patients daughter needs to speak with someone in regards to patients insulin, states he is not taking it because no one knows what he should be taking

## 2014-06-13 NOTE — Telephone Encounter (Signed)
Caller name: Chapman,[name] Relation to pt: daughter  Call back number:[phone]  Reason for call:  Pt daughter returning your call

## 2014-06-13 NOTE — Telephone Encounter (Signed)
No insulin since Friday. Has not been over 180. Recent readings over last 3 days. [phone] He has a hospital follow up with you on 12/8. Do you want to do anything with his insulin before then? Please advise.

## 2014-06-13 NOTE — Telephone Encounter (Signed)
LM on vooice for daughter to return the call.

## 2014-06-15 ENCOUNTER — Ambulatory Visit: Payer: Medicare HMO | Admitting: Radiation Oncology

## 2014-06-16 NOTE — Telephone Encounter (Signed)
I spoke to pts daughter today (was very rude) and she is upset because she has no idea who Hollace Kinnier, DO is and how this information got into the system and her fathers chart because he is not taking any of his diabetic medication. I informed pts daughter that this was probably a Dr from the facility her father was at. Daughter proceeded to say that it still shouldn't be in her fathers chart.   I informed Dr Birdie Riddle that the tradjenta said "no print" so Dr Birdie Riddle stated that this was probably given while pt was at the facility. And if pts BS level is still in the 160's, 170's and feeling ok pt could wait to discuss all this with Dr Charlett Blake on Dec 8th appt. I informed pts daughter to bring pts medication with him to review. And in the meantime if patient is feeling bad/worse to call us or if BS level goes over 200. Pts daughter said pt feels like "death" so what is going to change. I said mam I'm just relaying the message that was given to me and I'm trying to help. Patients daughter then proceeded to tell me that she didn't need attitude. I stated that I wasn't giving attitude we are trying to get this figured out. Pt then stated ok so if BS goes over 200 then to call us. I then informed pts daughter that I was going to transfer the message to Dr Birdie Riddle and let her make that final decision and if patient is feeling that bad maybe he should be seen before the 8th? Pts daughter proceeded to say that this is why she is calling and I just told her to call if it was over 200. I stated yes I did say that but I want a confirmation from the provider and someone would call her back after Dr Birdie Riddle advised the BS number (the concern if its to high).  Pt then stated that she would appreciate if someone else would call her back like "Gaye". I said I would make sure that would happen and she would get a call later this afternoon.  Please advise?

## 2014-06-16 NOTE — Telephone Encounter (Signed)
This apparently was lost. I could not find it in Dr Sharmaine Base inbasket. Daughter called today requesting advise. I advised her that his glucose is not at a dangerous level. He is still running in the 170 range with no insulin since Friday.   Please advise.

## 2014-06-16 NOTE — Telephone Encounter (Signed)
Agree w/ advice given- pt or family should call if blood sugar over 200.  If pt is feeling so poorly, he should be seen prior to 12/8.  If pt's is at his baseline (as indicated by daughter) can wait for appt w/ PCP.

## 2014-06-16 NOTE — Telephone Encounter (Signed)
His sugars are not out of control at this time.  Based on this, he should continue w/ his Tradjenta as prescribed on 11/25- 1 tab daily- and follow up w/ Dr Charlett Blake as scheduled.

## 2014-06-16 NOTE — Telephone Encounter (Signed)
Spoke with daughter and reassured of recommendations. Was concerned if something happens over the weekend. Advised that we could see him tomorrow if she would like but he has oncology appt. Advised daughter that if his sugar becomes elevated over the weekend to call and the after hours nurse would be able to talk to them. If the nurse sees necessary she will contact the on call MD. She feels better about what to do now and voices agreement.

## 2014-06-17 ENCOUNTER — Encounter: Payer: Self-pay | Admitting: Hematology & Oncology

## 2014-06-17 ENCOUNTER — Ambulatory Visit (HOSPITAL_BASED_OUTPATIENT_CLINIC_OR_DEPARTMENT_OTHER): Payer: Medicare HMO | Admitting: Hematology & Oncology

## 2014-06-17 ENCOUNTER — Other Ambulatory Visit (HOSPITAL_BASED_OUTPATIENT_CLINIC_OR_DEPARTMENT_OTHER): Payer: Medicare HMO | Admitting: Lab

## 2014-06-17 ENCOUNTER — Ambulatory Visit (HOSPITAL_BASED_OUTPATIENT_CLINIC_OR_DEPARTMENT_OTHER): Payer: Medicare HMO

## 2014-06-17 VITALS — BP 97/52 | HR 95 | Temp 97.5°F | Resp 18 | Ht 68.0 in | Wt 173.0 lb

## 2014-06-17 DIAGNOSIS — C3401 Malignant neoplasm of right main bronchus: Secondary | ICD-10-CM

## 2014-06-17 DIAGNOSIS — C3432 Malignant neoplasm of lower lobe, left bronchus or lung: Secondary | ICD-10-CM

## 2014-06-17 DIAGNOSIS — C801 Malignant (primary) neoplasm, unspecified: Secondary | ICD-10-CM

## 2014-06-17 DIAGNOSIS — R0600 Dyspnea, unspecified: Secondary | ICD-10-CM

## 2014-06-17 DIAGNOSIS — I2510 Atherosclerotic heart disease of native coronary artery without angina pectoris: Secondary | ICD-10-CM

## 2014-06-17 DIAGNOSIS — R7989 Other specified abnormal findings of blood chemistry: Secondary | ICD-10-CM

## 2014-06-17 DIAGNOSIS — C3402 Malignant neoplasm of left main bronchus: Secondary | ICD-10-CM

## 2014-06-17 DIAGNOSIS — R531 Weakness: Secondary | ICD-10-CM

## 2014-06-17 DIAGNOSIS — I480 Paroxysmal atrial fibrillation: Secondary | ICD-10-CM

## 2014-06-17 DIAGNOSIS — N183 Chronic kidney disease, stage 3 (moderate): Secondary | ICD-10-CM

## 2014-06-17 DIAGNOSIS — N179 Acute kidney failure, unspecified: Secondary | ICD-10-CM

## 2014-06-17 DIAGNOSIS — R42 Dizziness and giddiness: Secondary | ICD-10-CM

## 2014-06-17 DIAGNOSIS — I499 Cardiac arrhythmia, unspecified: Secondary | ICD-10-CM

## 2014-06-17 DIAGNOSIS — J9601 Acute respiratory failure with hypoxia: Secondary | ICD-10-CM

## 2014-06-17 DIAGNOSIS — R4781 Slurred speech: Secondary | ICD-10-CM

## 2014-06-17 DIAGNOSIS — C342 Malignant neoplasm of middle lobe, bronchus or lung: Secondary | ICD-10-CM

## 2014-06-17 DIAGNOSIS — E860 Dehydration: Secondary | ICD-10-CM

## 2014-06-17 DIAGNOSIS — E119 Type 2 diabetes mellitus without complications: Secondary | ICD-10-CM

## 2014-06-17 DIAGNOSIS — R630 Anorexia: Secondary | ICD-10-CM

## 2014-06-17 DIAGNOSIS — C3400 Malignant neoplasm of unspecified main bronchus: Secondary | ICD-10-CM

## 2014-06-17 DIAGNOSIS — R000 Tachycardia, unspecified: Secondary | ICD-10-CM

## 2014-06-17 LAB — COMPREHENSIVE METABOLIC PANEL
ALBUMIN: 2.6 g/dL — AB (ref 3.5–5.2)
ALT: 12 U/L (ref 0–53)
AST: 14 U/L (ref 0–37)
Alkaline Phosphatase: 98 U/L (ref 39–117)
BUN: 14 mg/dL (ref 6–23)
CALCIUM: 8.5 mg/dL (ref 8.4–10.5)
CHLORIDE: 103 meq/L (ref 96–112)
CO2: 25 meq/L (ref 19–32)
Creatinine, Ser: 1.77 mg/dL — ABNORMAL HIGH (ref 0.50–1.35)
Glucose, Bld: 142 mg/dL — ABNORMAL HIGH (ref 70–99)
POTASSIUM: 5.2 meq/L (ref 3.5–5.3)
SODIUM: 138 meq/L (ref 135–145)
TOTAL PROTEIN: 6 g/dL (ref 6.0–8.3)
Total Bilirubin: 0.7 mg/dL (ref 0.2–1.2)

## 2014-06-17 LAB — CBC WITH DIFFERENTIAL (CANCER CENTER ONLY)
BASO#: 0 10*3/uL (ref 0.0–0.2)
BASO%: 0.1 % (ref 0.0–2.0)
EOS%: 1.5 % (ref 0.0–7.0)
Eosinophils Absolute: 0.1 10*3/uL (ref 0.0–0.5)
HCT: 36 % — ABNORMAL LOW (ref 38.7–49.9)
HGB: 11.4 g/dL — ABNORMAL LOW (ref 13.0–17.1)
LYMPH#: 2 10*3/uL (ref 0.9–3.3)
LYMPH%: 23.3 % (ref 14.0–48.0)
MCH: 32.5 pg (ref 28.0–33.4)
MCHC: 31.7 g/dL — ABNORMAL LOW (ref 32.0–35.9)
MCV: 103 fL — ABNORMAL HIGH (ref 82–98)
MONO#: 1.2 10*3/uL — AB (ref 0.1–0.9)
MONO%: 13.9 % — ABNORMAL HIGH (ref 0.0–13.0)
NEUT%: 61.2 % (ref 40.0–80.0)
NEUTROS ABS: 5.2 10*3/uL (ref 1.5–6.5)
Platelets: 188 10*3/uL (ref 145–400)
RBC: 3.51 10*6/uL — ABNORMAL LOW (ref 4.20–5.70)
RDW: 19.4 % — AB (ref 11.1–15.7)
WBC: 8.5 10*3/uL (ref 4.0–10.0)

## 2014-06-17 LAB — TESTOSTERONE: TESTOSTERONE: 876 ng/dL (ref 300–890)

## 2014-06-17 MED ORDER — SODIUM CHLORIDE 0.9 % IV SOLN
1000.0000 mL | Freq: Once | INTRAVENOUS | Status: AC
  Administered 2014-06-17: 1000 mL via INTRAVENOUS

## 2014-06-17 MED ORDER — PANTOPRAZOLE SODIUM 40 MG PO PACK: 40.0000 mg | PACK | Freq: Every day | ORAL | Status: DC

## 2014-06-17 MED ORDER — HEPARIN SOD (PORK) LOCK FLUSH 100 UNIT/ML IV SOLN
500.0000 [IU] | Freq: Once | INTRAVENOUS | Status: AC
  Administered 2014-06-17: 500 [IU] via INTRAVENOUS
  Filled 2014-06-17: qty 5

## 2014-06-17 MED ORDER — SODIUM CHLORIDE 0.9 % IJ SOLN
10.0000 mL | INTRAMUSCULAR | Status: DC | PRN
  Administered 2014-06-17: 10 mL via INTRAVENOUS
  Filled 2014-06-17: qty 10

## 2014-06-17 NOTE — Progress Notes (Signed)
Hematology and Oncology Follow Up Visit  Gregory Farrell [id number] 1933-01-02 80 y.o. 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  Farrell is back for follow-up. He now is home. He left the rehabilitation center a week ago.  He is making some improvement slowly but surely. His appetite is still a little bit down.  He's had no bleeding. He's had no chest pain. He's had no cough. He's had no shortness of breath.  We are trying to cut his medicines down as much as possible. He really does not like taking medications. I want to try to minimize when he takes in only have him take what is truly necessary.  He cannot afford the [place] area and I cannot give him prednisone because of his diabetes. I cannot put him on Megace because of his past thromboembolic event. Hopefully, his appetite will come back on its own.  He will not do any physical therapy at home.  He is not having any pain issues. He's had no positive swelling in the right arm.  Overall, his performance status is ECOG 3.   His underlying cancer is responding. When he had his CT scans done in the hospital, it showed that he had improvement in the locally advanced squamous cell cancer.  He still is quite frail. He is trying. He really got knocked down quite a bit.   Medications: Current outpatient prescriptions: citalopram (CELEXA) 20 MG tablet, Take 1 tablet (20 mg total) by mouth daily. (Patient taking differently: Take 20 mg by mouth 2 (two) times daily. ), Disp: , Rfl: ;  insulin detemir (LEVEMIR) 100 UNIT/ML injection, Inject into the skin. No insulin in last week, d/t glucose readings., Disp: , Rfl:  lactulose (CHRONULAC) 10 GM/15ML solution, Take 30 mLs (20 g total) by mouth 2 (two) times daily as needed for mild constipation or moderate constipation., Disp: 500 mL, Rfl: 0;   pantoprazole sodium (PROTONIX) 40 mg/20 mL PACK, Take 20 mLs (40 mg total) by mouth daily., Disp: 60 each, Rfl: 0;  tamsulosin (FLOMAX) 0.4 MG CAPS capsule, Take 1 capsule (0.4 mg total) by mouth daily after supper., Disp: 30 capsule, Rfl: 0 budesonide-formoterol (SYMBICORT) 160-4.5 MCG/ACT inhaler, Inhale 2 puffs into the lungs 2 (two) times daily., Disp: , Rfl: ;  ondansetron (ZOFRAN) 8 MG tablet, Take 8 mg by mouth every 8 (eight) hours as needed for nausea or vomiting. Start after Chemo, Disp: , Rfl: ;  prochlorperazine (COMPAZINE) 10 MG tablet, Take 10 mg by mouth every 6 (six) hours as needed for nausea or vomiting., Disp: , Rfl:  No current facility-administered medications for this visit. Facility-Administered Medications Ordered in Other Visits: sodium chloride 0.9 % injection 10 mL, 10 mL, Intravenous, PRN, Volanda Napoleon, MD, 10 mL at 06/17/14 1609  Allergies:  Allergies  Allergen Reactions  . Exenatide     REACTION: nausea    Past Medical History, Surgical history, Social history, and Family History were reviewed and updated.  Review of Systems: As above  Physical Exam:  height is 5\' 8"  (1.727 m) and weight is 173 lb (78.472 kg). His oral temperature is 97.5 F (36.4 C). His blood pressure is 97/52 and his pulse is 95. His respiration is 18.   Elderly, chronically ill-appearing gentleman. His head and neck exam shows no ocular or oral lesions. He  has no palpable cervical or supraclavicular lymph nodes. His lungs are clear. Cardiac exam is regular rate and rhythm. He has an occasional extra beat. He has no murmurs rubs or bruits. Abdomen is soft. Has good bowel sounds. There is no fluid wave. There is no palpable liver or spleen tip. Extremities shows some mild nonpitting edema of the right arm. He has some trace edema in his legs. Skin exam shows no ecchymoses or petechia. Neurological exam is nonfocal.  Lab Results  Component Value Date   WBC 5.6 06/10/2014   HGB 10.4*  06/10/2014   HCT 32.0* 06/10/2014   MCV 101* 06/10/2014   PLT 147 Platelet count consistent in citrate 06/10/2014     Chemistry      Component Value Date/Time   NA 143 06/10/2014 1304   NA 140 05/22/2014 0548   NA 136 04/12/2014 1317   K 4.4 06/10/2014 1304   K 4.0 05/22/2014 0548   K 4.6 04/12/2014 1317   CL 102 06/10/2014 1304   CL 105 05/22/2014 0548   CO2 28 06/10/2014 1304   CO2 25 05/22/2014 0548   CO2 26 04/12/2014 1317   BUN 16 06/10/2014 1304   BUN 10 05/22/2014 0548   BUN 22.3 04/12/2014 1317   CREATININE 1.4* 06/10/2014 1304   CREATININE 1.40* 05/22/2014 0548   CREATININE 1.5* 04/12/2014 1317      Component Value Date/Time   CALCIUM 8.8 06/10/2014 1304   CALCIUM 8.2* 05/22/2014 0548   CALCIUM 9.2 04/12/2014 1317   ALKPHOS 90* 06/10/2014 1304   ALKPHOS 84 05/20/2014 0849   AST 23 06/10/2014 1304   AST 19 05/20/2014 0849   ALT 17 06/10/2014 1304   ALT 15 05/20/2014 0849   BILITOT 0.90 06/10/2014 1304   BILITOT 0.4 05/20/2014 0849         Impression and Plan: Gregory Farrell is 79 year old gentleman. He had radiation chemotherapy for his locally advanced squamous cell carcinoma. He had low-dose carboplatinum and Taxol. He has not had any therapy now for about 8 weeks.  I'm glad that his blood count is normalized. This, I believe is an indicator that the chemotherapy and radiation are are finally out of his system.  We gave him IV fluids today. This made him feel a little bit better.  I think we also gave him a testosterone injection last time he was here. I may give him another one when he comes back. I have to believe that his testosterone level is still on the low side. Back in mid September, his testosterone level was only 64.  I am still optimistic that he will continue to improve. At some point, we probably will have to check a CT scan on him again. I probably would not do this until the middle or late portion of January 2016.  I spent about 45 minutes  with he and his daughter today. We made some medicine adjustments. I gave her a new medicine list so that she will be able to have a better understanding of what he is taking.  We will get him back in about one or 2 weeks.   Volanda Napoleon, MD 12/4/20154:19 PM

## 2014-06-17 NOTE — Patient Instructions (Signed)
Dehydration, Adult Dehydration is when you lose more fluids from the body than you take in. Vital organs like the kidneys, brain, and heart cannot function without a proper amount of fluids and salt. Any loss of fluids from the body can cause dehydration.  CAUSES   Vomiting.  Diarrhea.  Excessive sweating.  Excessive urine output.  Fever. SYMPTOMS  Mild dehydration  Thirst.  Dry lips.  Slightly dry mouth. Moderate dehydration  Very dry mouth.  Sunken eyes.  Skin does not bounce back quickly when lightly pinched and released.  Dark urine and decreased urine production.  Decreased tear production.  Headache. Severe dehydration  Very dry mouth.  Extreme thirst.  Rapid, weak pulse (more than 100 beats per minute at rest).  Cold hands and feet.  Not able to sweat in spite of heat and temperature.  Rapid breathing.  Blue lips.  Confusion and lethargy.  Difficulty being awakened.  Minimal urine production.  No tears. DIAGNOSIS  Your caregiver will diagnose dehydration based on your symptoms and your exam. Blood and urine tests will help confirm the diagnosis. The diagnostic evaluation should also identify the cause of dehydration. TREATMENT  Treatment of mild or moderate dehydration can often be done at home by increasing the amount of fluids that you drink. It is best to drink small amounts of fluid more often. Drinking too much at one time can make vomiting worse. Refer to the home care instructions below. Severe dehydration needs to be treated at the hospital where you will probably be given intravenous (IV) fluids that contain water and electrolytes. HOME CARE INSTRUCTIONS   Ask your caregiver about specific rehydration instructions.  Drink enough fluids to keep your urine clear or pale yellow.  Drink small amounts frequently if you have nausea and vomiting.  Eat as you normally do.  Avoid:  Foods or drinks high in sugar.  Carbonated  drinks.  Juice.  Extremely hot or cold fluids.  Drinks with caffeine.  Fatty, greasy foods.  Alcohol.  Tobacco.  Overeating.  Gelatin desserts.  Wash your hands well to avoid spreading bacteria and viruses.  Only take over-the-counter or prescription medicines for pain, discomfort, or fever as directed by your caregiver.  Ask your caregiver if you should continue all prescribed and over-the-counter medicines.  Keep all follow-up appointments with your caregiver. SEEK MEDICAL CARE IF:  You have abdominal pain and it increases or stays in one area (localizes).  You have a rash, stiff neck, or severe headache.  You are irritable, sleepy, or difficult to awaken.  You are weak, dizzy, or extremely thirsty. SEEK IMMEDIATE MEDICAL CARE IF:   You are unable to keep fluids down or you get worse despite treatment.  You have frequent episodes of vomiting or diarrhea.  You have blood or green matter (bile) in your vomit.  You have blood in your stool or your stool looks black and tarry.  You have not urinated in 6 to 8 hours, or you have only urinated a small amount of very dark urine.  You have a fever.  You faint. MAKE SURE YOU:   Understand these instructions.  Will watch your condition.  Will get help right away if you are not doing well or get worse. Document Released: 07/01/2005 Document Revised: 09/23/2011 Document Reviewed: 02/18/2011 ExitCare Patient Information 2015 [employer]. This information is not intended to replace advice given to you by your health care provider. Make sure you discuss any questions you have with your health care   provider.  

## 2014-06-20 ENCOUNTER — Telehealth: Payer: Self-pay | Admitting: Hematology & Oncology

## 2014-06-20 NOTE — Telephone Encounter (Signed)
Left message on all 3 lines with 12-11 appointment

## 2014-06-20 NOTE — Telephone Encounter (Signed)
Gregory Farrell called made 12-14 appointment, she is aware 12-11 is a week and MD wanted him to be seen in 1 week per in basket. She would not schedule 12-10.

## 2014-06-21 ENCOUNTER — Encounter: Payer: Self-pay | Admitting: Family Medicine

## 2014-06-21 ENCOUNTER — Ambulatory Visit (INDEPENDENT_AMBULATORY_CARE_PROVIDER_SITE_OTHER): Payer: Medicare HMO | Admitting: Family Medicine

## 2014-06-21 VITALS — BP 118/64 | HR 95 | Temp 97.7°F | Ht 69.0 in | Wt 176.2 lb

## 2014-06-21 DIAGNOSIS — E1121 Type 2 diabetes mellitus with diabetic nephropathy: Secondary | ICD-10-CM

## 2014-06-21 DIAGNOSIS — K219 Gastro-esophageal reflux disease without esophagitis: Secondary | ICD-10-CM

## 2014-06-21 DIAGNOSIS — I10 Essential (primary) hypertension: Secondary | ICD-10-CM

## 2014-06-21 DIAGNOSIS — C3401 Malignant neoplasm of right main bronchus: Secondary | ICD-10-CM

## 2014-06-21 MED ORDER — ALBUTEROL SULFATE HFA 108 (90 BASE) MCG/ACT IN AERS: 2.0000 | INHALATION_SPRAY | Freq: Four times a day (QID) | RESPIRATORY_TRACT | Status: AC | PRN

## 2014-06-21 NOTE — Patient Instructions (Addendum)
Encouraged increased hydration and fiber in diet. Daily probiotics. If bowels not moving can use MOM 2 tbls po in 4 oz of warm prune juice by mouth every 2-3 days. If no results then repeat in 4 hours with  Dulcolax suppository pr, may repeat again in 4 more hours as needed. Seek care if symptoms worsen. Consider daily Miralax and/or Dulcolax if symptoms persist.    Goal with sugar is 80 to 150   64 oz of clear fluids daily Add a Fiber supplements such as Benefiber twice a day   Lactulose daily to twice a day  Restart Levemir at 2 units, can continue to increase by 2 units every 3 days if sugars remain above 100 and are seeing numbers above 160.  Check sugars each morning and 1 hour after biggest meal a couple times a week Eat lean proteins, fats and complex/brown carbs  When sugars are low eat simple carbs. Such as soda, hard candy, white bread

## 2014-06-24 ENCOUNTER — Ambulatory Visit: Payer: Medicare HMO | Admitting: Family

## 2014-06-24 ENCOUNTER — Ambulatory Visit: Payer: Medicare HMO

## 2014-06-24 ENCOUNTER — Other Ambulatory Visit: Payer: Medicare HMO | Admitting: Lab

## 2014-06-24 ENCOUNTER — Telehealth: Payer: Self-pay | Admitting: Hematology & Oncology

## 2014-06-24 NOTE — Telephone Encounter (Signed)
Sandy cx 12-14 said pt was feeling better, I transferred call to RN for follow up. RN transferred back to me said MD wants to see him in 2 weeks. Lovey Newcomer said she would call me back Monday to schedule that because she doesn't have her schedule in front of her.

## 2014-06-27 ENCOUNTER — Ambulatory Visit: Payer: Medicare HMO

## 2014-06-27 ENCOUNTER — Ambulatory Visit: Payer: Medicare HMO | Admitting: Family

## 2014-06-27 ENCOUNTER — Encounter: Payer: Self-pay | Admitting: Family Medicine

## 2014-06-27 ENCOUNTER — Other Ambulatory Visit: Payer: Medicare HMO | Admitting: Lab

## 2014-06-27 NOTE — Progress Notes (Signed)
Gregory Farrell  [id number] [birth year] 06/27/2014      Progress Note-Follow Up  Subjective  Chief Complaint  Chief Complaint  Patient presents with  . Follow-up    Medications    HPI  Patient is a 80 y.o. male in today for routine medical care. Patient is in today with his daughter to discuss blood sugars. They bring a log which shows blood sugars generally between 130 and 200. They deny polyuria and polydipsia. He is not presently using any of his Levemir. He has tolerated his treatment for lung cancer fairly well all things considered. Did have of his care consult but has decided to proceed with aggressive care at the present time. They do report he is feeling stronger and eating better. No other acute complaints. Denies CP/palp/SOB/HA/congestion/fevers/GI or GU c/o. Taking meds as prescribed  Past Medical History  Diagnosis Date  . Hypertension   . COPD (chronic obstructive pulmonary disease)   . GERD (gastroesophageal reflux disease)   . Renal insufficiency   . Hyperlipidemia   . CAD (coronary artery disease)   . B12 deficiency   . Memory loss   . Vertigo   . Diabetes mellitus     type II, poly neuropathy  . Overweight 01/31/2013  . Lung cancer, hilus 03/22/2014    Past Surgical History  Procedure Laterality Date  . Coronary angioplasty with stent placement      more than 10 years ago  . Inguinal hernia repair  2010  . Video bronchoscopy Bilateral 03/07/2014    Procedure: VIDEO BRONCHOSCOPY WITHOUT FLUORO;  Surgeon: Kathee Delton, MD;  Location: [facility];  Service: Cardiopulmonary;  Laterality: Bilateral;  . Esophagogastroduodenoscopy N/A 05/12/2014    Procedure: ESOPHAGOGASTRODUODENOSCOPY (EGD);  Surgeon: Arta Silence, MD;  Location: [facility];  Service: Endoscopy;  Laterality: N/A;    Family History  Problem Relation Age of Onset  . Coronary artery disease Mother   . Breast cancer Mother   . Diabetes Mother   . Leukemia Brother     History   Social  History  . Marital Status: Married    Spouse Name: N/A    Number of Children: 1  . Years of Education: N/A   Occupational History  . Retired     [employer]   Social History Main Topics  . Smoking status: Former Smoker -- 1.00 packs/day for 20 years    Types: Cigarettes    Start date: 09/09/1982    Quit date: 07/15/2002  . Smokeless tobacco: Never Used     Comment: quit smoking 11 years ago  . Alcohol Use: No  . Drug Use: Not on file  . Sexual Activity: Not on file   Other Topics Concern  . Not on file   Social History Narrative    Current Outpatient Prescriptions on File Prior to Visit  Medication Sig Dispense Refill  . budesonide-formoterol (SYMBICORT) 160-4.5 MCG/ACT inhaler Inhale 2 puffs into the lungs 2 (two) times daily.    . citalopram (CELEXA) 20 MG tablet Take 1 tablet (20 mg total) by mouth daily. (Patient taking differently: Take 20 mg by mouth 2 (two) times daily. )    . insulin detemir (LEVEMIR) 100 UNIT/ML injection Inject into the skin. No insulin in last week, d/t glucose readings.    . lactulose (CHRONULAC) 10 GM/15ML solution Take 30 mLs (20 g total) by mouth 2 (two) times daily as needed for mild constipation or moderate constipation. 500 mL 0  . pantoprazole sodium (PROTONIX)  40 mg/20 mL PACK Take 20 mLs (40 mg total) by mouth daily. 60 each 0  . prochlorperazine (COMPAZINE) 10 MG tablet Take 10 mg by mouth every 6 (six) hours as needed for nausea or vomiting.    . tamsulosin (FLOMAX) 0.4 MG CAPS capsule Take 1 capsule (0.4 mg total) by mouth daily after supper. 30 capsule 0   No current facility-administered medications on file prior to visit.    Allergies  Allergen Reactions  . Exenatide     REACTION: nausea    Review of Systems  Review of Systems  Constitutional: Positive for malaise/fatigue. Negative for fever.  HENT: Negative for congestion.   Eyes: Negative for discharge.  Respiratory: Positive for shortness of breath.   Cardiovascular:  Negative for chest pain, palpitations and leg swelling.  Gastrointestinal: Negative for nausea, abdominal pain and diarrhea.  Genitourinary: Negative for dysuria.  Musculoskeletal: Positive for myalgias. Negative for falls.  Skin: Negative for rash.  Neurological: Negative for loss of consciousness and headaches.  Endo/Heme/Allergies: Negative for polydipsia.  Psychiatric/Behavioral: Negative for depression and suicidal ideas. The patient is not nervous/anxious and does not have insomnia.     Objective  BP 118/64 mmHg  Pulse 95  Temp(Src) 97.7 F (36.5 C) (Oral)  Ht 5\' 9"  (1.753 m)  Wt 176 lb 3.2 oz (79.924 kg)  BMI 26.01 kg/m2  SpO2 97%  Physical Exam  Physical Exam  Constitutional: He is oriented to person, place, and time and well-developed, well-nourished, and in no distress. No distress.  HENT:  Head: Normocephalic and atraumatic.  Eyes: Conjunctivae are normal.  Neck: Neck supple. No thyromegaly present.  Cardiovascular: Normal rate, regular rhythm and normal heart sounds.   Pulmonary/Chest: Effort normal and breath sounds normal. No respiratory distress.  Decreased BS b/l bases  Abdominal: He exhibits no distension and no mass. There is no tenderness.  Musculoskeletal: He exhibits no edema.  Neurological: He is alert and oriented to person, place, and time.  Skin: Skin is warm.  Psychiatric: Memory, affect and judgment normal.    Lab Results  Component Value Date   TSH 1.360 05/07/2014   Lab Results  Component Value Date   WBC 8.5 06/17/2014   HGB 11.4* 06/17/2014   HCT 36.0* 06/17/2014   MCV 103* 06/17/2014   PLT 188 Platelet count consistent in citrate 06/17/2014   Lab Results  Component Value Date   CREATININE 1.77* 06/17/2014   BUN 14 06/17/2014   NA 138 06/17/2014   K 5.2 06/17/2014   CL 103 06/17/2014   CO2 25 06/17/2014   Lab Results  Component Value Date   ALT 12 06/17/2014   AST 14 06/17/2014   ALKPHOS 98 06/17/2014   BILITOT 0.7  06/17/2014   Lab Results  Component Value Date   CHOL 172 05/08/2014   Lab Results  Component Value Date   HDL 30* 05/08/2014   Lab Results  Component Value Date   LDLCALC 99 05/08/2014   Lab Results  Component Value Date   TRIG 214* 05/08/2014   Lab Results  Component Value Date   CHOLHDL 5.7 05/08/2014     Assessment & Plan  Essential hypertension Well controlled, no changes to meds. Encouraged heart healthy diet such as the DASH diet and exercise as tolerated.   GERD Avoid offending foods, start probiotics. Do not eat large meals in late evening and consider raising head of bed.   Diabetes mellitus, type 2 Sugars 130s to 200 without insulin, restart at just  2 units and titrate up by 2 units every 3 days if no numbers below 100 and numbers continue to run above 150  Malignant neoplasm of hilus of right lung Is tolerating treatments and at least for now is going to proceed with aggressive care. Will reconsider goals of care if clinicial picture changes.

## 2014-06-27 NOTE — Assessment & Plan Note (Signed)
Avoid offending foods, start probiotics. Do not eat large meals in late evening and consider raising head of bed.  

## 2014-06-27 NOTE — Assessment & Plan Note (Signed)
Is tolerating treatments and at least for now is going to proceed with aggressive care. Will reconsider goals of care if clinicial picture changes.

## 2014-06-27 NOTE — Assessment & Plan Note (Signed)
Sugars 130s to 200 without insulin, restart at just 2 units and titrate up by 2 units every 3 days if no numbers below 100 and numbers continue to run above 150

## 2014-06-27 NOTE — Assessment & Plan Note (Signed)
Well controlled, no changes to meds. Encouraged heart healthy diet such as the DASH diet and exercise as tolerated.  °

## 2014-07-11 ENCOUNTER — Telehealth: Payer: Self-pay | Admitting: Hematology & Oncology

## 2014-07-11 ENCOUNTER — Other Ambulatory Visit: Payer: Self-pay | Admitting: Family Medicine

## 2014-07-11 ENCOUNTER — Other Ambulatory Visit: Payer: Self-pay | Admitting: Hematology & Oncology

## 2014-07-11 DIAGNOSIS — E1101 Type 2 diabetes mellitus with hyperosmolarity with coma: Secondary | ICD-10-CM

## 2014-07-11 DIAGNOSIS — D510 Vitamin B12 deficiency anemia due to intrinsic factor deficiency: Secondary | ICD-10-CM

## 2014-07-11 DIAGNOSIS — C3401 Malignant neoplasm of right main bronchus: Secondary | ICD-10-CM

## 2014-07-11 NOTE — Telephone Encounter (Signed)
Gregory Farrell called and we scheduled 12-29 appointment, she didn't want 12-30 due to being so early. She is upset that wait time for MD is usually after appointment time and says her father cannot wait for long periods of time. She is refusing chest x-ray due to long wait times. Dr. Marin Olp aware.

## 2014-07-11 NOTE — Telephone Encounter (Signed)
Gregory Farrell called questions about scheduling appointment to see MD, transferred to RN

## 2014-07-12 ENCOUNTER — Other Ambulatory Visit (HOSPITAL_BASED_OUTPATIENT_CLINIC_OR_DEPARTMENT_OTHER): Payer: Medicare HMO | Admitting: Lab

## 2014-07-12 ENCOUNTER — Ambulatory Visit (HOSPITAL_BASED_OUTPATIENT_CLINIC_OR_DEPARTMENT_OTHER)
Admission: RE | Admit: 2014-07-12 | Discharge: 2014-07-12 | Disposition: A | Discharge status: Home / Self Care | Payer: Medicare HMO | Source: Ambulatory Visit | Attending: Hematology & Oncology | Admitting: Hematology & Oncology

## 2014-07-12 ENCOUNTER — Ambulatory Visit (HOSPITAL_BASED_OUTPATIENT_CLINIC_OR_DEPARTMENT_OTHER): Payer: Medicare HMO | Admitting: Hematology & Oncology

## 2014-07-12 ENCOUNTER — Encounter: Payer: Self-pay | Admitting: Hematology & Oncology

## 2014-07-12 ENCOUNTER — Ambulatory Visit (HOSPITAL_BASED_OUTPATIENT_CLINIC_OR_DEPARTMENT_OTHER): Payer: Medicare HMO

## 2014-07-12 VITALS — BP 96/67 | HR 88 | Temp 98.2°F | Resp 20 | Ht 69.0 in | Wt 168.0 lb

## 2014-07-12 DIAGNOSIS — R0602 Shortness of breath: Secondary | ICD-10-CM | POA: Diagnosis present

## 2014-07-12 DIAGNOSIS — C3432 Malignant neoplasm of lower lobe, left bronchus or lung: Secondary | ICD-10-CM

## 2014-07-12 DIAGNOSIS — E1101 Type 2 diabetes mellitus with hyperosmolarity with coma: Secondary | ICD-10-CM

## 2014-07-12 DIAGNOSIS — D510 Vitamin B12 deficiency anemia due to intrinsic factor deficiency: Secondary | ICD-10-CM

## 2014-07-12 DIAGNOSIS — C3401 Malignant neoplasm of right main bronchus: Secondary | ICD-10-CM

## 2014-07-12 DIAGNOSIS — E119 Type 2 diabetes mellitus without complications: Secondary | ICD-10-CM

## 2014-07-12 DIAGNOSIS — J90 Pleural effusion, not elsewhere classified: Secondary | ICD-10-CM | POA: Diagnosis not present

## 2014-07-12 DIAGNOSIS — C3402 Malignant neoplasm of left main bronchus: Secondary | ICD-10-CM

## 2014-07-12 DIAGNOSIS — G47419 Narcolepsy without cataplexy: Secondary | ICD-10-CM

## 2014-07-12 DIAGNOSIS — G4701 Insomnia due to medical condition: Secondary | ICD-10-CM

## 2014-07-12 DIAGNOSIS — R918 Other nonspecific abnormal finding of lung field: Secondary | ICD-10-CM

## 2014-07-12 DIAGNOSIS — Z85118 Personal history of other malignant neoplasm of bronchus and lung: Secondary | ICD-10-CM | POA: Insufficient documentation

## 2014-07-12 DIAGNOSIS — J189 Pneumonia, unspecified organism: Secondary | ICD-10-CM

## 2014-07-12 DIAGNOSIS — C3400 Malignant neoplasm of unspecified main bronchus: Secondary | ICD-10-CM

## 2014-07-12 DIAGNOSIS — J157 Pneumonia due to Mycoplasma pneumoniae: Secondary | ICD-10-CM

## 2014-07-12 LAB — CBC WITH DIFFERENTIAL (CANCER CENTER ONLY)
BASO#: 0 10*3/uL (ref 0.0–0.2)
BASO%: 0.1 % (ref 0.0–2.0)
EOS%: 3 % (ref 0.0–7.0)
Eosinophils Absolute: 0.3 10*3/uL (ref 0.0–0.5)
HCT: 43.6 % (ref 38.7–49.9)
HGB: 13.6 g/dL (ref 13.0–17.1)
LYMPH#: 1.8 10*3/uL (ref 0.9–3.3)
LYMPH%: 15.4 % (ref 14.0–48.0)
MCH: 31.9 pg (ref 28.0–33.4)
MCHC: 31.2 g/dL — AB (ref 32.0–35.9)
MCV: 102 fL — ABNORMAL HIGH (ref 82–98)
MONO#: 1.1 10*3/uL — AB (ref 0.1–0.9)
MONO%: 9.7 % (ref 0.0–13.0)
NEUT#: 8.1 10*3/uL — ABNORMAL HIGH (ref 1.5–6.5)
NEUT%: 71.8 % (ref 40.0–80.0)
PLATELETS: 212 10*3/uL (ref 145–400)
RBC: 4.27 10*6/uL (ref 4.20–5.70)
RDW: 16.6 % — ABNORMAL HIGH (ref 11.1–15.7)
WBC: 11.3 10*3/uL — AB (ref 4.0–10.0)

## 2014-07-12 LAB — CMP (CANCER CENTER ONLY)
ALT: 12 U/L (ref 10–47)
AST: 18 U/L (ref 11–38)
Albumin: 2.7 g/dL — ABNORMAL LOW (ref 3.3–5.5)
Alkaline Phosphatase: 105 U/L — ABNORMAL HIGH (ref 26–84)
BUN, Bld: 10 mg/dL (ref 7–22)
CALCIUM: 9.3 mg/dL (ref 8.0–10.3)
CHLORIDE: 100 meq/L (ref 98–108)
CO2: 27 meq/L (ref 18–33)
Creat: 1.5 mg/dl — ABNORMAL HIGH (ref 0.6–1.2)
Glucose, Bld: 164 mg/dL — ABNORMAL HIGH (ref 73–118)
Potassium: 4.1 mEq/L (ref 3.3–4.7)
SODIUM: 135 meq/L (ref 128–145)
TOTAL PROTEIN: 7.5 g/dL (ref 6.4–8.1)
Total Bilirubin: 0.7 mg/dl (ref 0.20–1.60)

## 2014-07-12 LAB — TECHNOLOGIST REVIEW CHCC SATELLITE

## 2014-07-12 LAB — TESTOSTERONE: Testosterone: 143 ng/dL — ABNORMAL LOW (ref 300–890)

## 2014-07-12 LAB — VITAMIN B12: Vitamin B-12: 309 pg/mL (ref 211–911)

## 2014-07-12 MED ORDER — ZOLPIDEM TARTRATE 5 MG PO TABS: 5.0000 mg | ORAL_TABLET | Freq: Every evening | ORAL | Status: DC | PRN

## 2014-07-12 MED ORDER — CEFDINIR 300 MG PO CAPS: 300.0000 mg | ORAL_CAPSULE | Freq: Two times a day (BID) | ORAL | Status: DC

## 2014-07-12 MED ORDER — DEXTROSE 5 % IV SOLN
2.0000 g | Freq: Once | INTRAVENOUS | Status: AC
  Administered 2014-07-12: 2 g via INTRAVENOUS
  Filled 2014-07-12: qty 2

## 2014-07-12 MED ORDER — METHYLPHENIDATE HCL 10 MG PO TABS: 10.0000 mg | ORAL_TABLET | Freq: Two times a day (BID) | ORAL | Status: DC

## 2014-07-12 MED ORDER — IPRATROPIUM BROMIDE 0.02 % IN SOLN
0.5000 mg | Freq: Once | RESPIRATORY_TRACT | Status: AC
  Administered 2014-07-12: 0.5 mg via RESPIRATORY_TRACT
  Filled 2014-07-12: qty 2.5

## 2014-07-12 MED ORDER — IPRATROPIUM BROMIDE 0.02 % IN SOLN
RESPIRATORY_TRACT | Status: AC
  Filled 2014-07-12: qty 2.5

## 2014-07-12 MED ORDER — ALBUTEROL SULFATE (5 MG/ML) 0.5% IN NEBU
2.5000 mg | INHALATION_SOLUTION | Freq: Once | RESPIRATORY_TRACT | Status: AC
  Administered 2014-07-12: 2.5 mg via RESPIRATORY_TRACT
  Filled 2014-07-12: qty 0.5

## 2014-07-12 NOTE — Patient Instructions (Signed)
Ceftriaxone injection What is this medicine? CEFTRIAXONE (sef try AX one) is a cephalosporin antibiotic. It is used to treat certain kinds of bacterial infections. It will not work for colds, flu, or other viral infections. This medicine may be used for other purposes; ask your health care provider or pharmacist if you have questions. COMMON BRAND NAME(S): Rocephin What should I tell my health care provider before I take this medicine? They need to know if you have any of these conditions: -any chronic illness -bowel disease, like colitis -both kidney and liver disease -high bilirubin level in newborn patients -an unusual or allergic reaction to ceftriaxone, other cephalosporin or penicillin antibiotics, foods, dyes or preservatives -pregnant or trying to get pregnant -breast-feeding How should I use this medicine? This medicine is injected into a muscle or infused it into a vein. It is usually given in a medical office or clinic. If you are to give this medicine you will be taught how to inject it. Follow instructions carefully. Use your doses at regular intervals. Do not take your medicine more often than directed. Do not skip doses or stop your medicine early even if you feel better. Do not stop taking except on your doctor's advice. Talk to your pediatrician regarding the use of this medicine in children. Special care may be needed. Overdosage: If you think you have taken too much of this medicine contact a poison control center or emergency room at once. NOTE: This medicine is only for you. Do not share this medicine with others. What if I miss a dose? If you miss a dose, take it as soon as you can. If it is almost time for your next dose, take only that dose. Do not take double or extra doses. What may interact with this medicine? Do not take this medicine with any of the following medications: -intravenous calcium This medicine may also interact with the following medications: -birth  control pills This list may not describe all possible interactions. Give your health care provider a list of all the medicines, herbs, non-prescription drugs, or dietary supplements you use. Also tell them if you smoke, drink alcohol, or use illegal drugs. Some items may interact with your medicine. What should I watch for while using this medicine? Tell your doctor or health care professional if your symptoms do not improve or if they get worse. Do not treat diarrhea with over the counter products. Contact your doctor if you have diarrhea that lasts more than 2 days or if it is severe and watery. If you are being treated for a sexually transmitted disease, avoid sexual contact until you have finished your treatment. Having sex can infect your sexual partner. Calcium may bind to this medicine and cause lung or kidney problems. Avoid calcium products while taking this medicine and for 48 hours after taking the last dose of this medicine. What side effects may I notice from receiving this medicine? Side effects that you should report to your doctor or health care professional as soon as possible: -allergic reactions like skin rash, itching or hives, swelling of the face, lips, or tongue -breathing problems -fever, chills -irregular heartbeat -pain when passing urine -seizures -stomach pain, cramps -unusual bleeding, bruising -unusually weak or tired Side effects that usually do not require medical attention (report to your doctor or health care professional if they continue or are bothersome): -diarrhea -dizzy, drowsy -headache -nausea, vomiting -pain, swelling, irritation where injected -stomach upset -sweating This list may not describe all possible side effects.  Call your doctor for medical advice about side effects. You may report side effects to FDA at 1-800-FDA-1088. Where should I keep my medicine? Keep out of the reach of children. Store at room temperature below 25 degrees C (77  degrees F). Protect from light. Throw away any unused vials after the expiration date. NOTE: This sheet is a summary. It may not cover all possible information. If you have questions about this medicine, talk to your doctor, pharmacist, or health care provider.  2015, [employer]. (2013-02-04 15:59:53)

## 2014-07-12 NOTE — Progress Notes (Signed)
Referral made to [facility] per family & Dr Antonieta Pert request. They will follow up with patient.

## 2014-07-12 NOTE — Telephone Encounter (Signed)
Rx sent to the pharmacy by e-script.//AB/CMA 

## 2014-07-12 NOTE — Progress Notes (Signed)
Hematology and Oncology Follow Up Visit  BREYLIN DOM [id number] [birth year] 80 y.o. 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  Gregory Farrell is back for follow-up. He still is not doing well. His daughter says he is in bed most of the time. He just does not want to eat do much.  He does not want to eat much. I am not sure as to why this is. He cannot afford Megace. He cannot afford Marinol.  He is not hurting. He's had no cough. He's had shortness of breath which is chronic. He's had no arm swelling of the right arm where he had the DVT.  He's had no problems going to the bathroom.  I think that there might be an element of depression that he has. Is hard to convince him of this.  His blood sugars have been doing okay.  He is not sleeping all that well. He ran out of the Ambien.  Overall, his performance status is ECOG 3.    Medications: Current outpatient prescriptions: albuterol (PROVENTIL HFA;VENTOLIN HFA) 108 (90 BASE) MCG/ACT inhaler, Inhale 2 puffs into the lungs every 6 (six) hours as needed for wheezing or shortness of breath (cough)., Disp: 1 Inhaler, Rfl: 1;  budesonide-formoterol (SYMBICORT) 160-4.5 MCG/ACT inhaler, Inhale 2 puffs into the lungs 2 (two) times daily., Disp: , Rfl:  cefdinir (OMNICEF) 300 MG capsule, Take 1 capsule (300 mg total) by mouth 2 (two) times daily., Disp: 14 capsule, Rfl: 0;  citalopram (CELEXA) 20 MG tablet, Take 1 tablet (20 mg total) by mouth daily. (Patient taking differently: Take 20 mg by mouth 2 (two) times daily. ), Disp: , Rfl: ;  insulin detemir (LEVEMIR) 100 UNIT/ML injection, Inject into the skin. No insulin in last week, d/t glucose readings., Disp: , Rfl:  lactulose (CHRONULAC) 10 GM/15ML solution, Take 30 mLs (20 g total) by mouth 2 (two) times daily as needed for mild constipation or moderate  constipation., Disp: 500 mL, Rfl: 0;  methylphenidate (RITALIN) 10 MG tablet, Take 1 tablet (10 mg total) by mouth 2 (two) times daily., Disp: 60 tablet, Rfl: 0;  pantoprazole sodium (PROTONIX) 40 mg/20 mL PACK, Take 20 mLs (40 mg total) by mouth daily., Disp: 60 each, Rfl: 0 prochlorperazine (COMPAZINE) 10 MG tablet, Take 10 mg by mouth every 6 (six) hours as needed for nausea or vomiting., Disp: , Rfl: ;  tamsulosin (FLOMAX) 0.4 MG CAPS capsule, Take 1 capsule (0.4 mg total) by mouth daily after supper., Disp: 30 capsule, Rfl: 0;  zolpidem (AMBIEN) 5 MG tablet, Take 1 tablet (5 mg total) by mouth at bedtime as needed for sleep., Disp: 30 tablet, Rfl: 0 Current facility-administered medications: cefTRIAXone (ROCEPHIN) 2 g in dextrose 5 % 50 mL IVPB, 2 g, Intravenous, Q24H, Volanda Napoleon, MD  Allergies:  Allergies  Allergen Reactions  . Exenatide     REACTION: nausea    Past Medical History, Surgical history, Social history, and Family History were reviewed and updated.  Review of Systems: As above  Physical Exam:  height is 5\' 9"  (1.753 m) and weight is 168 lb (76.204 kg). His oral temperature is 98.2 F (36.8 C). His blood pressure is 96/67 and his pulse is 88. His respiration is 20 and oxygen saturation is 98%.   Elderly, chronically ill-appearing gentleman. His head and neck exam shows  no ocular or oral lesions. He has no palpable cervical or supraclavicular lymph nodes. His lungs are clear. Cardiac exam is regular rate and rhythm. He has an occasional extra beat. He has no murmurs rubs or bruits. Abdomen is soft. Has good bowel sounds. There is no fluid wave. There is no palpable liver or spleen tip. Extremities shows some mild nonpitting edema of the right arm. He has some trace edema in his legs. Skin exam shows no ecchymoses or petechia. Neurological exam is nonfocal.  Lab Results  Component Value Date   WBC 11.3* 07/12/2014   HGB 13.6 07/12/2014   HCT 43.6 07/12/2014   MCV 102*  07/12/2014   PLT 212 07/12/2014     Chemistry      Component Value Date/Time   NA 135 07/12/2014 1132   NA 138 06/17/2014 1401   NA 136 04/12/2014 1317   K 4.1 07/12/2014 1132   K 5.2 06/17/2014 1401   K 4.6 04/12/2014 1317   CL 100 07/12/2014 1132   CL 103 06/17/2014 1401   CO2 27 07/12/2014 1132   CO2 25 06/17/2014 1401   CO2 26 04/12/2014 1317   BUN 10 07/12/2014 1132   BUN 14 06/17/2014 1401   BUN 22.3 04/12/2014 1317   CREATININE 1.5* 07/12/2014 1132   CREATININE 1.77* 06/17/2014 1401   CREATININE 1.5* 04/12/2014 1317      Component Value Date/Time   CALCIUM 9.3 07/12/2014 1132   CALCIUM 8.5 06/17/2014 1401   CALCIUM 9.2 04/12/2014 1317   ALKPHOS 105* 07/12/2014 1132   ALKPHOS 98 06/17/2014 1401   AST 18 07/12/2014 1132   AST 14 06/17/2014 1401   ALT 12 07/12/2014 1132   ALT 12 06/17/2014 1401   BILITOT 0.70 07/12/2014 1132   BILITOT 0.7 06/17/2014 1401         Impression and Plan: Mr. Lafitte is 80 year old gentleman. He had radiation chemotherapy for his locally advanced squamous cell carcinoma. He had low-dose carboplatinum and Taxol. He has not had any therapy now for about 12 weeks.  He has pneumonia now. We did a chest x-ray on him today. He has an infiltrate on the left side. This might be why he doesn't feel all that good. Some of his problem might be mental. I don't know if there is a element of depression. His labs look okay. I keep telling him that his cancer is not going to kill him.  He is on Celexa. I want to try him on some Ritalin.  I don't think he needs iron or anything along those lines.  I want to see him back in about 4 weeks. Hopefully, he will be more active. We will get another x-ray on him. I slowly cannot discount the possibility of cancer coming back.  This truly is incredibly challenging. I spent about 40 minutes with he and his daughter.  We can get hospice involved area and they may be able to help out at  home.Volanda Napoleon, MD 12/29/20151:08 PM

## 2014-07-13 ENCOUNTER — Ambulatory Visit: Payer: Medicare HMO | Admitting: Hematology & Oncology

## 2014-07-13 ENCOUNTER — Other Ambulatory Visit: Payer: Medicare HMO | Admitting: Lab

## 2014-07-13 ENCOUNTER — Other Ambulatory Visit: Payer: Self-pay | Admitting: *Deleted

## 2014-07-13 DIAGNOSIS — C3401 Malignant neoplasm of right main bronchus: Secondary | ICD-10-CM

## 2014-07-13 LAB — IRON AND TIBC CHCC
%SAT: 25 % (ref 20–55)
Iron: 41 ug/dL — ABNORMAL LOW (ref 42–163)
TIBC: 166 ug/dL — AB (ref 202–409)
UIBC: 125 ug/dL (ref 117–376)

## 2014-07-13 LAB — TSH CHCC: TSH: 1.391 m(IU)/L (ref 0.320–4.118)

## 2014-07-13 LAB — FERRITIN CHCC: Ferritin: 753 ng/ml — ABNORMAL HIGH (ref 22–316)

## 2014-07-13 MED ORDER — TAMSULOSIN HCL 0.4 MG PO CAPS: 0.4000 mg | ORAL_CAPSULE | Freq: Every day | ORAL | Status: DC

## 2014-07-14 ENCOUNTER — Telehealth: Payer: Self-pay | Admitting: Nurse Practitioner

## 2014-07-14 NOTE — Telephone Encounter (Addendum)
-----   Message from Volanda Napoleon, MD sent at 07/13/2014  5:12 PM EST ----- Please call his daughter and tell her that his iron level is actually low. His testosterone level is actually low. I think we can make him feel better by giving him IV iron and testosterone. If she agrees, please set this up for next week. Thank you. Bethanne Ginger with [facility], his daughter and she verbalized they would talk it over during the weekend and if they decided to come in she would call us back Monday and let us know. She is not sure if they are interested due to the hassle of preparing him to come in. She verbalized understanding of the results and benefits.

## 2014-07-18 ENCOUNTER — Telehealth: Payer: Self-pay | Admitting: *Deleted

## 2014-07-18 ENCOUNTER — Other Ambulatory Visit: Payer: Self-pay | Admitting: *Deleted

## 2014-07-18 ENCOUNTER — Encounter: Payer: Self-pay | Admitting: *Deleted

## 2014-07-18 DIAGNOSIS — D510 Vitamin B12 deficiency anemia due to intrinsic factor deficiency: Secondary | ICD-10-CM

## 2014-07-18 NOTE — Telephone Encounter (Signed)
Received call from [facility] stating that patient and family felt like there was some improvement, and that at this time, they did not want Hospice involvement. Dr Marin Olp notified.

## 2014-07-20 ENCOUNTER — Ambulatory Visit (HOSPITAL_BASED_OUTPATIENT_CLINIC_OR_DEPARTMENT_OTHER): Payer: Medicare HMO

## 2014-07-20 VITALS — BP 120/68 | HR 82 | Temp 98.0°F | Resp 20

## 2014-07-20 DIAGNOSIS — R531 Weakness: Secondary | ICD-10-CM

## 2014-07-20 DIAGNOSIS — D509 Iron deficiency anemia, unspecified: Secondary | ICD-10-CM

## 2014-07-20 DIAGNOSIS — D510 Vitamin B12 deficiency anemia due to intrinsic factor deficiency: Secondary | ICD-10-CM

## 2014-07-20 DIAGNOSIS — E291 Testicular hypofunction: Secondary | ICD-10-CM

## 2014-07-20 MED ORDER — TESTOSTERONE CYPIONATE 200 MG/ML IM SOLN
INTRAMUSCULAR | Status: AC
  Filled 2014-07-20: qty 2

## 2014-07-20 MED ORDER — TESTOSTERONE CYPIONATE 200 MG/ML IM SOLN
300.0000 mg | INTRAMUSCULAR | Status: DC
  Administered 2014-07-20: 300 mg via INTRAMUSCULAR

## 2014-07-20 MED ORDER — SODIUM CHLORIDE 0.9 % IV SOLN
510.0000 mg | Freq: Once | INTRAVENOUS | Status: AC
  Administered 2014-07-20: 510 mg via INTRAVENOUS
  Filled 2014-07-20: qty 17

## 2014-07-20 MED ORDER — SODIUM CHLORIDE 0.9 % IJ SOLN
10.0000 mL | Freq: Once | INTRAMUSCULAR | Status: AC
  Administered 2014-07-20: 10 mL via INTRAVENOUS
  Filled 2014-07-20: qty 10

## 2014-07-20 MED ORDER — HEPARIN SOD (PORK) LOCK FLUSH 100 UNIT/ML IV SOLN
500.0000 [IU] | Freq: Once | INTRAVENOUS | Status: AC
Start: 2014-07-20 — End: 2014-07-20
  Administered 2014-07-20: 500 [IU] via INTRAVENOUS
  Filled 2014-07-20: qty 5

## 2014-07-20 NOTE — Patient Instructions (Signed)
Ferumoxytol injection What is this medicine? FERUMOXYTOL is an iron complex. Iron is used to make healthy red blood cells, which carry oxygen and nutrients throughout the body. This medicine is used to treat iron deficiency anemia in people with chronic kidney disease. This medicine may be used for other purposes; ask your health care provider or pharmacist if you have questions. COMMON BRAND NAME(S): Feraheme What should I tell my health care provider before I take this medicine? They need to know if you have any of these conditions: -anemia not caused by low iron levels -high levels of iron in the blood -magnetic resonance imaging (MRI) test scheduled -an unusual or allergic reaction to iron, other medicines, foods, dyes, or preservatives -pregnant or trying to get pregnant -breast-feeding How should I use this medicine? This medicine is for injection into a vein. It is given by a health care professional in a hospital or clinic setting. Talk to your pediatrician regarding the use of this medicine in children. Special care may be needed. Overdosage: If you think you've taken too much of this medicine contact a poison control center or emergency room at once. Overdosage: If you think you have taken too much of this medicine contact a poison control center or emergency room at once. NOTE: This medicine is only for you. Do not share this medicine with others. What if I miss a dose? It is important not to miss your dose. Call your doctor or health care professional if you are unable to keep an appointment. What may interact with this medicine? This medicine may interact with the following medications: -other iron products This list may not describe all possible interactions. Give your health care provider a list of all the medicines, herbs, non-prescription drugs, or dietary supplements you use. Also tell them if you smoke, drink alcohol, or use illegal drugs. Some items may interact with your  medicine. What should I watch for while using this medicine? Visit your doctor or healthcare professional regularly. Tell your doctor or healthcare professional if your symptoms do not start to get better or if they get worse. You may need blood work done while you are taking this medicine. You may need to follow a special diet. Talk to your doctor. Foods that contain iron include: whole grains/cereals, dried fruits, beans, or peas, leafy green vegetables, and organ meats (liver, kidney). What side effects may I notice from receiving this medicine? Side effects that you should report to your doctor or health care professional as soon as possible: -allergic reactions like skin rash, itching or hives, swelling of the face, lips, or tongue -breathing problems -changes in blood pressure -feeling faint or lightheaded, falls -fever or chills -flushing, sweating, or hot feelings -swelling of the ankles or feet Side effects that usually do not require medical attention (Report these to your doctor or health care professional if they continue or are bothersome.): -diarrhea -headache -nausea, vomiting -stomach pain This list may not describe all possible side effects. Call your doctor for medical advice about side effects. You may report side effects to FDA at 1-800-FDA-1088. Where should I keep my medicine? This drug is given in a hospital or clinic and will not be stored at home. NOTE: This sheet is a summary. It may not cover all possible information. If you have questions about this medicine, talk to your doctor, pharmacist, or health care provider.  2015, Elsevier/Gold Standard. (2012-02-14 15:23:36) Testosterone injection What is this medicine? TESTOSTERONE (tes TOS ter one) is the main  male hormone. It supports normal male development such as muscle growth, facial hair, and deep voice. It is used in males to treat low testosterone levels. This medicine may be used for other purposes; ask your  health care provider or pharmacist if you have questions. COMMON BRAND NAME(S): Andro-L.A., Aveed, Delatestryl, Depo-Testosterone, Virilon What should I tell my health care provider before I take this medicine? They need to know if you have any of these conditions: -breast cancer -diabetes -heart disease -kidney disease -liver disease -lung disease -prostate cancer, enlargement -an unusual or allergic reaction to testosterone, other medicines, foods, dyes, or preservatives -pregnant or trying to get pregnant -breast-feeding How should I use this medicine? This medicine is for injection into a muscle. It is usually given by a health care professional in a hospital or clinic setting. Contact your pediatrician regarding the use of this medicine in children. While this medicine may be prescribed for children as young as 30 years of age for selected conditions, precautions do apply. Overdosage: If you think you have taken too much of this medicine contact a poison control center or emergency room at once. NOTE: This medicine is only for you. Do not share this medicine with others. What if I miss a dose? Try not to miss a dose. Your doctor or health care professional will tell you when your next injection is due. Notify the office if you are unable to keep an appointment. What may interact with this medicine? -medicines for diabetes -medicines that treat or prevent blood clots like warfarin -oxyphenbutazone -propranolol -steroid medicines like prednisone or cortisone This list may not describe all possible interactions. Give your health care provider a list of all the medicines, herbs, non-prescription drugs, or dietary supplements you use. Also tell them if you smoke, drink alcohol, or use illegal drugs. Some items may interact with your medicine. What should I watch for while using this medicine? Visit your doctor or health care professional for regular checks on your progress. They will  need to check the level of testosterone in your blood. This medicine is only approved for use in men who have low levels of testosterone related to certain medical conditions. Heart attacks and strokes have been reported with the use of this medicine. Notify your doctor or health care professional and seek emergency treatment if you develop breathing problems; changes in vision; confusion; chest pain or chest tightness; sudden arm pain; severe, sudden headache; trouble speaking or understanding; sudden numbness or weakness of the face, arm or leg; loss of balance or coordination. Talk to your doctor about the risks and benefits of this medicine. This medicine may affect blood sugar levels. If you have diabetes, check with your doctor or health care professional before you change your diet or the dose of your diabetic medicine. This drug is banned from use in athletes by most athletic organizations. What side effects may I notice from receiving this medicine? Side effects that you should report to your doctor or health care professional as soon as possible: -allergic reactions like skin rash, itching or hives, swelling of the face, lips, or tongue -breast enlargement -breathing problems -changes in mood, especially anger, depression, or rage -dark urine -general ill feeling or flu-like symptoms -light-colored stools -loss of appetite, nausea -nausea, vomiting -right upper belly pain -stomach pain -swelling of ankles -too frequent or persistent erections -trouble passing urine or change in the amount of urine -unusually weak or tired -yellowing of the eyes or skin Additional side effects that  can occur in women include: -deep or hoarse voice -facial hair growth -irregular menstrual periods Side effects that usually do not require medical attention (report to your doctor or health care professional if they continue or are bothersome): -acne -change in sex drive or performance -hair  loss -headache This list may not describe all possible side effects. Call your doctor for medical advice about side effects. You may report side effects to FDA at 1-800-FDA-1088. Where should I keep my medicine? Keep out of the reach of children. This medicine can be abused. Keep your medicine in a safe place to protect it from theft. Do not share this medicine with anyone. Selling or giving away this medicine is dangerous and against the law. Store at room temperature between 20 and 25 degrees C (68 and 77 degrees F). Do not freeze. Protect from light. Follow the directions for the product you are prescribed. Throw away any unused medicine after the expiration date. NOTE: This sheet is a summary. It may not cover all possible information. If you have questions about this medicine, talk to your doctor, pharmacist, or health care provider.  2015, Elsevier/Gold Standard. (2013-09-16 02:63:78)

## 2014-07-22 ENCOUNTER — Telehealth: Payer: Self-pay | Admitting: Hematology & Oncology

## 2014-07-22 NOTE — Telephone Encounter (Signed)
[employer] has APPROVED the ZOLPIDEM TARTRATE TAB  and is good until 07/15/2015.          COPY SCANNED

## 2014-07-26 ENCOUNTER — Telehealth: Payer: Self-pay | Admitting: Hematology & Oncology

## 2014-07-26 NOTE — Telephone Encounter (Signed)
[facility] [place]           [id number]           [id number]           [id number]           [id number]             [id number]           [id number]           [id number]           [id number]           [id number]          I spoke w Joy on today 07/26/2014. P: [phone]

## 2014-08-03 ENCOUNTER — Other Ambulatory Visit: Payer: Self-pay | Admitting: Hematology & Oncology

## 2014-08-09 ENCOUNTER — Ambulatory Visit: Payer: Medicare HMO

## 2014-08-09 ENCOUNTER — Encounter: Payer: Self-pay | Admitting: Hematology & Oncology

## 2014-08-09 ENCOUNTER — Ambulatory Visit (HOSPITAL_BASED_OUTPATIENT_CLINIC_OR_DEPARTMENT_OTHER): Payer: Medicare HMO | Admitting: Hematology & Oncology

## 2014-08-09 ENCOUNTER — Ambulatory Visit (HOSPITAL_BASED_OUTPATIENT_CLINIC_OR_DEPARTMENT_OTHER)
Admission: RE | Admit: 2014-08-09 | Discharge: 2014-08-09 | Disposition: A | Discharge status: Home / Self Care | Payer: Medicare HMO | Source: Ambulatory Visit | Attending: Hematology & Oncology | Admitting: Hematology & Oncology

## 2014-08-09 ENCOUNTER — Other Ambulatory Visit (HOSPITAL_BASED_OUTPATIENT_CLINIC_OR_DEPARTMENT_OTHER): Payer: Medicare HMO | Admitting: Lab

## 2014-08-09 VITALS — BP 114/66 | HR 87 | Temp 97.6°F | Resp 20 | Ht 68.0 in | Wt 161.0 lb

## 2014-08-09 DIAGNOSIS — R911 Solitary pulmonary nodule: Secondary | ICD-10-CM | POA: Diagnosis not present

## 2014-08-09 DIAGNOSIS — C3402 Malignant neoplasm of left main bronchus: Secondary | ICD-10-CM

## 2014-08-09 DIAGNOSIS — R05 Cough: Secondary | ICD-10-CM | POA: Insufficient documentation

## 2014-08-09 DIAGNOSIS — Z85118 Personal history of other malignant neoplasm of bronchus and lung: Secondary | ICD-10-CM | POA: Insufficient documentation

## 2014-08-09 DIAGNOSIS — I480 Paroxysmal atrial fibrillation: Secondary | ICD-10-CM

## 2014-08-09 DIAGNOSIS — R42 Dizziness and giddiness: Secondary | ICD-10-CM

## 2014-08-09 DIAGNOSIS — I499 Cardiac arrhythmia, unspecified: Secondary | ICD-10-CM

## 2014-08-09 DIAGNOSIS — D510 Vitamin B12 deficiency anemia due to intrinsic factor deficiency: Secondary | ICD-10-CM

## 2014-08-09 DIAGNOSIS — I2510 Atherosclerotic heart disease of native coronary artery without angina pectoris: Secondary | ICD-10-CM

## 2014-08-09 DIAGNOSIS — C3432 Malignant neoplasm of lower lobe, left bronchus or lung: Secondary | ICD-10-CM

## 2014-08-09 DIAGNOSIS — J157 Pneumonia due to Mycoplasma pneumoniae: Secondary | ICD-10-CM

## 2014-08-09 DIAGNOSIS — I10 Essential (primary) hypertension: Secondary | ICD-10-CM

## 2014-08-09 DIAGNOSIS — J9601 Acute respiratory failure with hypoxia: Secondary | ICD-10-CM

## 2014-08-09 DIAGNOSIS — R7989 Other specified abnormal findings of blood chemistry: Secondary | ICD-10-CM

## 2014-08-09 DIAGNOSIS — R531 Weakness: Secondary | ICD-10-CM

## 2014-08-09 DIAGNOSIS — R4781 Slurred speech: Secondary | ICD-10-CM

## 2014-08-09 DIAGNOSIS — E860 Dehydration: Secondary | ICD-10-CM

## 2014-08-09 DIAGNOSIS — G47419 Narcolepsy without cataplexy: Secondary | ICD-10-CM

## 2014-08-09 DIAGNOSIS — Z8701 Personal history of pneumonia (recurrent): Secondary | ICD-10-CM | POA: Diagnosis not present

## 2014-08-09 DIAGNOSIS — G4701 Insomnia due to medical condition: Secondary | ICD-10-CM

## 2014-08-09 DIAGNOSIS — Z87891 Personal history of nicotine dependence: Secondary | ICD-10-CM | POA: Diagnosis not present

## 2014-08-09 DIAGNOSIS — C3401 Malignant neoplasm of right main bronchus: Secondary | ICD-10-CM

## 2014-08-09 DIAGNOSIS — R0600 Dyspnea, unspecified: Secondary | ICD-10-CM

## 2014-08-09 DIAGNOSIS — E1100 Type 2 diabetes mellitus with hyperosmolarity without nonketotic hyperglycemic-hyperosmolar coma (NKHHC): Secondary | ICD-10-CM

## 2014-08-09 DIAGNOSIS — C801 Malignant (primary) neoplasm, unspecified: Secondary | ICD-10-CM

## 2014-08-09 DIAGNOSIS — R000 Tachycardia, unspecified: Secondary | ICD-10-CM

## 2014-08-09 LAB — CMP (CANCER CENTER ONLY)
ALBUMIN: 2.7 g/dL — AB (ref 3.3–5.5)
ALK PHOS: 104 U/L — AB (ref 26–84)
ALT(SGPT): 13 U/L (ref 10–47)
AST: 15 U/L (ref 11–38)
BUN, Bld: 13 mg/dL (ref 7–22)
CHLORIDE: 97 meq/L — AB (ref 98–108)
CO2: 25 mEq/L (ref 18–33)
CREATININE: 1.5 mg/dL — AB (ref 0.6–1.2)
Calcium: 8.8 mg/dL (ref 8.0–10.3)
GLUCOSE: 155 mg/dL — AB (ref 73–118)
Potassium: 3.9 mEq/L (ref 3.3–4.7)
Sodium: 134 mEq/L (ref 128–145)
TOTAL PROTEIN: 7.1 g/dL (ref 6.4–8.1)
Total Bilirubin: 0.9 mg/dl (ref 0.20–1.60)

## 2014-08-09 LAB — CBC WITH DIFFERENTIAL (CANCER CENTER ONLY)
BASO#: 0 10*3/uL (ref 0.0–0.2)
BASO%: 0.2 % (ref 0.0–2.0)
EOS%: 1.9 % (ref 0.0–7.0)
Eosinophils Absolute: 0.2 10*3/uL (ref 0.0–0.5)
HEMATOCRIT: 45.8 % (ref 38.7–49.9)
HEMOGLOBIN: 14.5 g/dL (ref 13.0–17.1)
LYMPH#: 1.5 10*3/uL (ref 0.9–3.3)
LYMPH%: 14.5 % (ref 14.0–48.0)
MCH: 31 pg (ref 28.0–33.4)
MCHC: 31.7 g/dL — AB (ref 32.0–35.9)
MCV: 98 fL (ref 82–98)
MONO#: 1.1 10*3/uL — ABNORMAL HIGH (ref 0.1–0.9)
MONO%: 10.9 % (ref 0.0–13.0)
NEUT#: 7.2 10*3/uL — ABNORMAL HIGH (ref 1.5–6.5)
NEUT%: 72.5 % (ref 40.0–80.0)
Platelets: 123 10*3/uL — ABNORMAL LOW (ref 145–400)
RBC: 4.68 10*6/uL (ref 4.20–5.70)
RDW: 15.3 % (ref 11.1–15.7)
WBC: 10 10*3/uL (ref 4.0–10.0)

## 2014-08-09 LAB — TESTOSTERONE: Testosterone: 386 ng/dL (ref 300–890)

## 2014-08-09 LAB — PREALBUMIN: Prealbumin: 13.1 mg/dL — ABNORMAL LOW (ref 17.0–34.0)

## 2014-08-10 ENCOUNTER — Telehealth: Payer: Self-pay | Admitting: Hematology & Oncology

## 2014-08-10 NOTE — Telephone Encounter (Signed)
Left message with 2-15 appointment. I fixed referral left PT message with referral

## 2014-08-10 NOTE — Progress Notes (Signed)
Hematology and Oncology Follow Up Visit  Gregory Farrell [id number] 1932/08/19 81 y.o. 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  Gregory Farrell is back for follow-up. He still is not doing well. His daughter says he is in bed most of the time. He just does not want to eat do much. He does not want to do a lot of activity. I will see about some physical therapy for him.  He does not want to eat much. I am not sure as to why this is. He cannot afford Megace. He cannot afford Marinol.  He is not hurting. He's had no cough. He's had shortness of breath which is chronic. He's had no arm swelling of the right arm where he had the DVT.  He's had no problems going to the bathroom.  I think that there might be an element of depression that he has. Is hard to convince him of this.  His blood sugars have been doing okay.  He is not sleeping all that well. He ran out of the Ambien.  Overall, his performance status is ECOG 3.    Medications:  Current outpatient prescriptions:  .  albuterol (PROVENTIL HFA;VENTOLIN HFA) 108 (90 BASE) MCG/ACT inhaler, Inhale 2 puffs into the lungs every 6 (six) hours as needed for wheezing or shortness of breath (cough)., Disp: 1 Inhaler, Rfl: 1 .  budesonide-formoterol (SYMBICORT) 160-4.5 MCG/ACT inhaler, Inhale 2 puffs into the lungs 2 (two) times daily., Disp: , Rfl:  .  citalopram (CELEXA) 20 MG tablet, Take 1 tablet (20 mg total) by mouth daily. (Patient taking differently: Take 20 mg by mouth 2 (two) times daily. ), Disp: , Rfl:  .  insulin detemir (LEVEMIR) 100 UNIT/ML injection, Inject into the skin. No insulin in last week, d/t glucose readings., Disp: , Rfl:  .  lactulose (CHRONULAC) 10 GM/15ML solution, Take 30 mLs (20 g total) by mouth 2 (two) times daily as needed for mild constipation or moderate constipation., Disp:  500 mL, Rfl: 0 .  methylphenidate (RITALIN) 10 MG tablet, Take 1 tablet (10 mg total) by mouth 2 (two) times daily., Disp: 60 tablet, Rfl: 0 .  NOVOFINE 32G X 6 MM MISC, use once daily with levemir, Disp: 50 each, Rfl: 1 .  pantoprazole sodium (PROTONIX) 40 mg/20 mL PACK, Take 20 mLs (40 mg total) by mouth daily., Disp: 60 each, Rfl: 0 .  prochlorperazine (COMPAZINE) 10 MG tablet, Take 10 mg by mouth every 6 (six) hours as needed for nausea or vomiting., Disp: , Rfl:  .  tamsulosin (FLOMAX) 0.4 MG CAPS capsule, TAKE ONE CAPSULE BY MOUTH ONCE DAILY AFTER SUPPER, Disp: 30 capsule, Rfl: 0 .  zolpidem (AMBIEN) 5 MG tablet, Take 1 tablet (5 mg total) by mouth at bedtime as needed for sleep., Disp: 30 tablet, Rfl: 0  Allergies:  Allergies  Allergen Reactions  . Exenatide     REACTION: nausea    Past Medical History, Surgical history, Social history, and Family History were reviewed and updated.  Review of Systems: As above  Physical Exam:  height is 5\' 8"  (1.727 m) and weight is 161 lb (73.029 kg). His oral temperature is 97.6 F (36.4 C). His blood pressure is 114/66 and his pulse is 87. His respiration is 20.   Elderly, chronically ill-appearing gentleman. His head and neck exam  shows no ocular or oral lesions. He has no palpable cervical or supraclavicular lymph nodes. His lungs are clear. Cardiac exam is regular rate and rhythm. He has an occasional extra beat. He has no murmurs rubs or bruits. Abdomen is soft. Has good bowel sounds. There is no fluid wave. There is no palpable liver or spleen tip. Extremities shows some mild nonpitting edema of the right arm. He has some trace edema in his legs. Skin exam shows no ecchymoses or petechia. Neurological exam is nonfocal. He does have constant tongue rolling  Lab Results  Component Value Date   WBC 10.0 08/09/2014   HGB 14.5 08/09/2014   HCT 45.8 08/09/2014   MCV 98 08/09/2014   PLT 123* 08/09/2014     Chemistry      Component Value  Date/Time   NA 134 08/09/2014 1135   NA 138 06/17/2014 1401   NA 136 04/12/2014 1317   K 3.9 08/09/2014 1135   K 5.2 06/17/2014 1401   K 4.6 04/12/2014 1317   CL 97* 08/09/2014 1135   CL 103 06/17/2014 1401   CO2 25 08/09/2014 1135   CO2 25 06/17/2014 1401   CO2 26 04/12/2014 1317   BUN 13 08/09/2014 1135   BUN 14 06/17/2014 1401   BUN 22.3 04/12/2014 1317   CREATININE 1.5* 08/09/2014 1135   CREATININE 1.77* 06/17/2014 1401   CREATININE 1.5* 04/12/2014 1317      Component Value Date/Time   CALCIUM 8.8 08/09/2014 1135   CALCIUM 8.5 06/17/2014 1401   CALCIUM 9.2 04/12/2014 1317   ALKPHOS 104* 08/09/2014 1135   ALKPHOS 98 06/17/2014 1401   AST 15 08/09/2014 1135   AST 14 06/17/2014 1401   ALT 13 08/09/2014 1135   ALT 12 06/17/2014 1401   BILITOT 0.90 08/09/2014 1135   BILITOT 0.7 06/17/2014 1401         Impression and Plan: Gregory Farrell is 81 year old gentleman. He had radiation chemotherapy for his locally advanced squamous cell carcinoma. He had low-dose carboplatinum and Taxol. He has not had any therapy now for about 4 months.  On his chest x-ray today, there was the left lower lung nodule. This looked pretty stable. I would think this is going to cause him problems.  There may be some underlying neurological issue. He constantly moves his tongue. I made a recommendation for neurological evaluation but his daughter doesn't think this would be fruitful.  Unfortunately, he just is in the mindset that he's not calling to get better. He just will not do much to help himself. I can appreciate his daughter's frustration. There is not much that we can do for this.  We will try some physical therapy. Maybe if he goes to physical therapy, this might help.  I don't think he really drinks or eats that much. Again, nothing we can do to force this.  I spent about 40 minutes with he and his daughter. I just do not know what else we can do to try to improve his situation.  I will  plan to get him back in another 2-3 weeks. I don't think when he any x-rays.   Volanda Napoleon, MD 1/27/20166:35 AM

## 2014-08-15 ENCOUNTER — Other Ambulatory Visit: Payer: Self-pay | Admitting: Hematology & Oncology

## 2014-08-17 ENCOUNTER — Inpatient Hospital Stay (HOSPITAL_COMMUNITY)
Admission: EM | Admit: 2014-08-17 | Discharge: 2014-08-19 | DRG: 175 | Disposition: A | Discharge status: Hospice | Payer: Medicare HMO | Attending: Internal Medicine | Admitting: Internal Medicine

## 2014-08-17 ENCOUNTER — Emergency Department (HOSPITAL_COMMUNITY): Payer: Medicare HMO

## 2014-08-17 ENCOUNTER — Encounter (HOSPITAL_COMMUNITY): Payer: Self-pay | Admitting: Emergency Medicine

## 2014-08-17 DIAGNOSIS — I82629 Acute embolism and thrombosis of deep veins of unspecified upper extremity: Secondary | ICD-10-CM | POA: Diagnosis present

## 2014-08-17 DIAGNOSIS — J439 Emphysema, unspecified: Secondary | ICD-10-CM | POA: Diagnosis present

## 2014-08-17 DIAGNOSIS — S0181XA Laceration without foreign body of other part of head, initial encounter: Secondary | ICD-10-CM | POA: Diagnosis present

## 2014-08-17 DIAGNOSIS — Z794 Long term (current) use of insulin: Secondary | ICD-10-CM | POA: Diagnosis not present

## 2014-08-17 DIAGNOSIS — I2510 Atherosclerotic heart disease of native coronary artery without angina pectoris: Secondary | ICD-10-CM | POA: Diagnosis present

## 2014-08-17 DIAGNOSIS — R918 Other nonspecific abnormal finding of lung field: Secondary | ICD-10-CM | POA: Diagnosis present

## 2014-08-17 DIAGNOSIS — E785 Hyperlipidemia, unspecified: Secondary | ICD-10-CM | POA: Diagnosis present

## 2014-08-17 DIAGNOSIS — D6869 Other thrombophilia: Secondary | ICD-10-CM | POA: Diagnosis present

## 2014-08-17 DIAGNOSIS — W19XXXA Unspecified fall, initial encounter: Secondary | ICD-10-CM | POA: Diagnosis present

## 2014-08-17 DIAGNOSIS — J9600 Acute respiratory failure, unspecified whether with hypoxia or hypercapnia: Secondary | ICD-10-CM | POA: Diagnosis present

## 2014-08-17 DIAGNOSIS — E876 Hypokalemia: Secondary | ICD-10-CM | POA: Diagnosis present

## 2014-08-17 DIAGNOSIS — Y92009 Unspecified place in unspecified non-institutional (private) residence as the place of occurrence of the external cause: Secondary | ICD-10-CM

## 2014-08-17 DIAGNOSIS — R55 Syncope and collapse: Secondary | ICD-10-CM | POA: Diagnosis present

## 2014-08-17 DIAGNOSIS — C3401 Malignant neoplasm of right main bronchus: Secondary | ICD-10-CM

## 2014-08-17 DIAGNOSIS — R32 Unspecified urinary incontinence: Secondary | ICD-10-CM | POA: Diagnosis present

## 2014-08-17 DIAGNOSIS — S0101XA Laceration without foreign body of scalp, initial encounter: Secondary | ICD-10-CM | POA: Diagnosis present

## 2014-08-17 DIAGNOSIS — R627 Adult failure to thrive: Secondary | ICD-10-CM | POA: Diagnosis present

## 2014-08-17 DIAGNOSIS — S01112A Laceration without foreign body of left eyelid and periocular area, initial encounter: Secondary | ICD-10-CM | POA: Diagnosis present

## 2014-08-17 DIAGNOSIS — Z87891 Personal history of nicotine dependence: Secondary | ICD-10-CM

## 2014-08-17 DIAGNOSIS — S61512A Laceration without foreign body of left wrist, initial encounter: Secondary | ICD-10-CM | POA: Diagnosis present

## 2014-08-17 DIAGNOSIS — J9601 Acute respiratory failure with hypoxia: Secondary | ICD-10-CM | POA: Diagnosis present

## 2014-08-17 DIAGNOSIS — J90 Pleural effusion, not elsewhere classified: Secondary | ICD-10-CM | POA: Insufficient documentation

## 2014-08-17 DIAGNOSIS — Z888 Allergy status to other drugs, medicaments and biological substances status: Secondary | ICD-10-CM

## 2014-08-17 DIAGNOSIS — Z86718 Personal history of other venous thrombosis and embolism: Secondary | ICD-10-CM | POA: Diagnosis not present

## 2014-08-17 DIAGNOSIS — Z955 Presence of coronary angioplasty implant and graft: Secondary | ICD-10-CM

## 2014-08-17 DIAGNOSIS — E119 Type 2 diabetes mellitus without complications: Secondary | ICD-10-CM

## 2014-08-17 DIAGNOSIS — I480 Paroxysmal atrial fibrillation: Secondary | ICD-10-CM | POA: Diagnosis present

## 2014-08-17 DIAGNOSIS — I2699 Other pulmonary embolism without acute cor pulmonale: Secondary | ICD-10-CM | POA: Diagnosis present

## 2014-08-17 DIAGNOSIS — N400 Enlarged prostate without lower urinary tract symptoms: Secondary | ICD-10-CM | POA: Diagnosis present

## 2014-08-17 DIAGNOSIS — Z79899 Other long term (current) drug therapy: Secondary | ICD-10-CM | POA: Diagnosis not present

## 2014-08-17 DIAGNOSIS — T671XXD Heat syncope, subsequent encounter: Secondary | ICD-10-CM

## 2014-08-17 DIAGNOSIS — C3400 Malignant neoplasm of unspecified main bronchus: Secondary | ICD-10-CM | POA: Diagnosis present

## 2014-08-17 DIAGNOSIS — C3492 Malignant neoplasm of unspecified part of left bronchus or lung: Secondary | ICD-10-CM

## 2014-08-17 DIAGNOSIS — J910 Malignant pleural effusion: Secondary | ICD-10-CM | POA: Diagnosis present

## 2014-08-17 DIAGNOSIS — K219 Gastro-esophageal reflux disease without esophagitis: Secondary | ICD-10-CM | POA: Diagnosis present

## 2014-08-17 DIAGNOSIS — I10 Essential (primary) hypertension: Secondary | ICD-10-CM | POA: Diagnosis present

## 2014-08-17 DIAGNOSIS — J449 Chronic obstructive pulmonary disease, unspecified: Secondary | ICD-10-CM | POA: Diagnosis present

## 2014-08-17 DIAGNOSIS — C3402 Malignant neoplasm of left main bronchus: Secondary | ICD-10-CM | POA: Diagnosis present

## 2014-08-17 DIAGNOSIS — Z66 Do not resuscitate: Secondary | ICD-10-CM | POA: Diagnosis present

## 2014-08-17 DIAGNOSIS — Z515 Encounter for palliative care: Secondary | ICD-10-CM

## 2014-08-17 HISTORY — DX: Other pulmonary embolism without acute cor pulmonale: I26.99

## 2014-08-17 HISTORY — DX: Personal history of other medical treatment: Z92.89

## 2014-08-17 HISTORY — DX: Type 2 diabetes mellitus without complications: E11.9

## 2014-08-17 HISTORY — DX: Acute embolism and thrombosis of unspecified deep veins of unspecified lower extremity: I82.409

## 2014-08-17 LAB — URINALYSIS, ROUTINE W REFLEX MICROSCOPIC
Bilirubin Urine: NEGATIVE
Glucose, UA: NEGATIVE mg/dL
Hgb urine dipstick: NEGATIVE
Ketones, ur: NEGATIVE mg/dL
Leukocytes, UA: NEGATIVE
Nitrite: NEGATIVE
PROTEIN: NEGATIVE mg/dL
Specific Gravity, Urine: 1.015 (ref 1.005–1.030)
Urobilinogen, UA: 0.2 mg/dL (ref 0.0–1.0)
pH: 5 (ref 5.0–8.0)

## 2014-08-17 LAB — CBC WITH DIFFERENTIAL/PLATELET
Basophils Absolute: 0 10*3/uL (ref 0.0–0.1)
Basophils Relative: 0 % (ref 0–1)
EOS ABS: 0.2 10*3/uL (ref 0.0–0.7)
Eosinophils Relative: 2 % (ref 0–5)
HCT: 40.8 % (ref 39.0–52.0)
Hemoglobin: 13.3 g/dL (ref 13.0–17.0)
Lymphocytes Relative: 15 % (ref 12–46)
Lymphs Abs: 1.6 10*3/uL (ref 0.7–4.0)
MCH: 31 pg (ref 26.0–34.0)
MCHC: 32.6 g/dL (ref 30.0–36.0)
MCV: 95.1 fL (ref 78.0–100.0)
Monocytes Absolute: 0.9 10*3/uL (ref 0.1–1.0)
Monocytes Relative: 9 % (ref 3–12)
Neutro Abs: 7.7 10*3/uL (ref 1.7–7.7)
Neutrophils Relative %: 74 % (ref 43–77)
PLATELETS: UNDETERMINED 10*3/uL (ref 150–400)
RBC: 4.29 MIL/uL (ref 4.22–5.81)
RDW: 15 % (ref 11.5–15.5)
WBC: 10.4 10*3/uL (ref 4.0–10.5)

## 2014-08-17 LAB — I-STAT CHEM 8, ED
BUN: 13 mg/dL (ref 6–23)
Calcium, Ion: 1.03 mmol/L — ABNORMAL LOW (ref 1.13–1.30)
Chloride: 103 mmol/L (ref 96–112)
Creatinine, Ser: 1.3 mg/dL (ref 0.50–1.35)
GLUCOSE: 206 mg/dL — AB (ref 70–99)
HEMATOCRIT: 53 % — AB (ref 39.0–52.0)
HEMOGLOBIN: 18 g/dL — AB (ref 13.0–17.0)
POTASSIUM: 3.3 mmol/L — AB (ref 3.5–5.1)
Sodium: 138 mmol/L (ref 135–145)
TCO2: 19 mmol/L (ref 0–100)

## 2014-08-17 LAB — I-STAT TROPONIN, ED: Troponin i, poc: 0.04 ng/mL (ref 0.00–0.08)

## 2014-08-17 LAB — BRAIN NATRIURETIC PEPTIDE: B NATRIURETIC PEPTIDE 5: 272.1 pg/mL — AB (ref 0.0–100.0)

## 2014-08-17 LAB — GLUCOSE, CAPILLARY
GLUCOSE-CAPILLARY: 139 mg/dL — AB (ref 70–99)
Glucose-Capillary: 138 mg/dL — ABNORMAL HIGH (ref 70–99)

## 2014-08-17 LAB — COMPREHENSIVE METABOLIC PANEL
ALT: 18 U/L (ref 0–53)
ANION GAP: 9 (ref 5–15)
AST: 22 U/L (ref 0–37)
Albumin: 2 g/dL — ABNORMAL LOW (ref 3.5–5.2)
Alkaline Phosphatase: 108 U/L (ref 39–117)
BUN: 9 mg/dL (ref 6–23)
CO2: 23 mmol/L (ref 19–32)
CREATININE: 1.48 mg/dL — AB (ref 0.50–1.35)
Calcium: 7.7 mg/dL — ABNORMAL LOW (ref 8.4–10.5)
Chloride: 103 mmol/L (ref 96–112)
GFR calc Af Amer: 50 mL/min — ABNORMAL LOW (ref >90)
GFR calc non Af Amer: 43 mL/min — ABNORMAL LOW (ref >90)
Glucose, Bld: 208 mg/dL — ABNORMAL HIGH (ref 70–99)
Potassium: 3.2 mmol/L — ABNORMAL LOW (ref 3.5–5.1)
SODIUM: 135 mmol/L (ref 135–145)
TOTAL PROTEIN: 5.7 g/dL — AB (ref 6.0–8.3)
Total Bilirubin: 0.6 mg/dL (ref 0.3–1.2)

## 2014-08-17 LAB — PROTIME-INR
INR: 1.07 (ref 0.00–1.49)
Prothrombin Time: 14.1 seconds (ref 11.6–15.2)

## 2014-08-17 LAB — CBG MONITORING, ED: Glucose-Capillary: 185 mg/dL — ABNORMAL HIGH (ref 70–99)

## 2014-08-17 MED ORDER — ZOLPIDEM TARTRATE 5 MG PO TABS
5.0000 mg | ORAL_TABLET | Freq: Every evening | ORAL | Status: DC | PRN
  Administered 2014-08-17 – 2014-08-18 (×2): 5 mg via ORAL
  Filled 2014-08-17 (×2): qty 1

## 2014-08-17 MED ORDER — CITALOPRAM HYDROBROMIDE 20 MG PO TABS
20.0000 mg | ORAL_TABLET | Freq: Two times a day (BID) | ORAL | Status: DC
  Administered 2014-08-17 – 2014-08-19 (×4): 20 mg via ORAL
  Filled 2014-08-17 (×7): qty 1

## 2014-08-17 MED ORDER — INSULIN DETEMIR 100 UNIT/ML ~~LOC~~ SOLN
5.0000 [IU] | Freq: Every day | SUBCUTANEOUS | Status: DC
  Administered 2014-08-18 – 2014-08-19 (×2): 5 [IU] via SUBCUTANEOUS
  Filled 2014-08-17 (×3): qty 0.05

## 2014-08-17 MED ORDER — HEPARIN BOLUS VIA INFUSION
4000.0000 [IU] | Freq: Once | INTRAVENOUS | Status: AC
  Administered 2014-08-17: 4000 [IU] via INTRAVENOUS
  Filled 2014-08-17: qty 4000

## 2014-08-17 MED ORDER — ENSURE COMPLETE PO LIQD
237.0000 mL | Freq: Two times a day (BID) | ORAL | Status: DC
  Administered 2014-08-18 (×2): 237 mL via ORAL

## 2014-08-17 MED ORDER — SODIUM CHLORIDE 0.9 % IJ SOLN: 3.0000 mL | Freq: Two times a day (BID) | INTRAMUSCULAR | Status: DC

## 2014-08-17 MED ORDER — HEPARIN (PORCINE) IN NACL 100-0.45 UNIT/ML-% IJ SOLN
1150.0000 [IU]/h | INTRAMUSCULAR | Status: DC
  Administered 2014-08-17: 1150 [IU]/h via INTRAVENOUS
  Filled 2014-08-17: qty 250

## 2014-08-17 MED ORDER — POLYETHYLENE GLYCOL 3350 17 G PO PACK
17.0000 g | PACK | Freq: Every day | ORAL | Status: DC | PRN
  Filled 2014-08-17: qty 1

## 2014-08-17 MED ORDER — ONDANSETRON HCL 4 MG PO TABS: 4.0000 mg | ORAL_TABLET | Freq: Four times a day (QID) | ORAL | Status: DC | PRN

## 2014-08-17 MED ORDER — LIDOCAINE HCL 2 % IJ SOLN
10.0000 mL | Freq: Once | INTRAMUSCULAR | Status: AC
  Administered 2014-08-17: 200 mg via INTRADERMAL
  Filled 2014-08-17: qty 20

## 2014-08-17 MED ORDER — IOHEXOL 350 MG/ML SOLN
100.0000 mL | Freq: Once | INTRAVENOUS | Status: AC | PRN
  Administered 2014-08-17: 100 mL via INTRAVENOUS

## 2014-08-17 MED ORDER — INSULIN ASPART 100 UNIT/ML ~~LOC~~ SOLN: 0.0000 [IU] | Freq: Every day | SUBCUTANEOUS | Status: DC

## 2014-08-17 MED ORDER — ONDANSETRON HCL 4 MG/2ML IJ SOLN: 4.0000 mg | Freq: Four times a day (QID) | INTRAMUSCULAR | Status: DC | PRN

## 2014-08-17 MED ORDER — ALUM & MAG HYDROXIDE-SIMETH 200-200-20 MG/5ML PO SUSP: 30.0000 mL | Freq: Four times a day (QID) | ORAL | Status: DC | PRN

## 2014-08-17 MED ORDER — TAMSULOSIN HCL 0.4 MG PO CAPS
0.4000 mg | ORAL_CAPSULE | Freq: Every day | ORAL | Status: DC
  Administered 2014-08-17 – 2014-08-19 (×3): 0.4 mg via ORAL
  Filled 2014-08-17 (×3): qty 1

## 2014-08-17 MED ORDER — HYDROCODONE-ACETAMINOPHEN 5-325 MG PO TABS
1.0000 | ORAL_TABLET | ORAL | Status: DC | PRN
  Administered 2014-08-17: 2 via ORAL
  Filled 2014-08-17: qty 2

## 2014-08-17 MED ORDER — GUAIFENESIN-DM 100-10 MG/5ML PO SYRP: 5.0000 mL | ORAL_SOLUTION | ORAL | Status: DC | PRN

## 2014-08-17 MED ORDER — SODIUM CHLORIDE 0.9 % IV BOLUS (SEPSIS)
1000.0000 mL | Freq: Once | INTRAVENOUS | Status: AC
  Administered 2014-08-17: 1000 mL via INTRAVENOUS

## 2014-08-17 MED ORDER — LACTULOSE 10 GM/15ML PO SOLN: 20.0000 g | Freq: Two times a day (BID) | ORAL | Status: DC | PRN

## 2014-08-17 MED ORDER — TETANUS-DIPHTH-ACELL PERTUSSIS 5-2.5-18.5 LF-MCG/0.5 IM SUSP
0.5000 mL | Freq: Once | INTRAMUSCULAR | Status: AC
  Administered 2014-08-17: 0.5 mL via INTRAMUSCULAR
  Filled 2014-08-17 (×2): qty 0.5

## 2014-08-17 MED ORDER — PANTOPRAZOLE SODIUM 40 MG PO PACK
40.0000 mg | PACK | Freq: Every day | ORAL | Status: DC
Start: 2014-08-17 — End: 2014-08-17

## 2014-08-17 MED ORDER — HEPARIN (PORCINE) IN NACL 100-0.45 UNIT/ML-% IJ SOLN
1200.0000 [IU]/h | INTRAMUSCULAR | Status: DC
  Administered 2014-08-17: 1150 [IU]/h via INTRAVENOUS
  Filled 2014-08-17 (×3): qty 250

## 2014-08-17 MED ORDER — INSULIN ASPART 100 UNIT/ML ~~LOC~~ SOLN
0.0000 [IU] | Freq: Three times a day (TID) | SUBCUTANEOUS | Status: DC
  Administered 2014-08-17: 1 [IU] via SUBCUTANEOUS
  Administered 2014-08-18: 3 [IU] via SUBCUTANEOUS

## 2014-08-17 MED ORDER — POTASSIUM CHLORIDE CRYS ER 20 MEQ PO TBCR
40.0000 meq | EXTENDED_RELEASE_TABLET | ORAL | Status: AC
  Administered 2014-08-17 (×2): 40 meq via ORAL
  Filled 2014-08-17 (×2): qty 2

## 2014-08-17 MED ORDER — SODIUM CHLORIDE 0.9 % IJ SOLN
10.0000 mL | INTRAMUSCULAR | Status: DC | PRN
  Administered 2014-08-17 – 2014-08-19 (×2): 10 mL
  Filled 2014-08-17: qty 40

## 2014-08-17 MED ORDER — ENOXAPARIN SODIUM 80 MG/0.8ML ~~LOC~~ SOLN
1.0000 mg/kg | Freq: Two times a day (BID) | SUBCUTANEOUS | Status: DC
  Administered 2014-08-17: 75 mg via SUBCUTANEOUS
  Filled 2014-08-17 (×2): qty 0.8

## 2014-08-17 NOTE — Consult Note (Signed)
Met with patient and daughter today.  They are aware of PE and issues surrounding lung malignancy.  They are most interested in talking further with Dr Marin Olp.  I discussed case with his PCP Dr Charlett Blake as well as contacted Dr Marin Olp who will come and see the patient here at [facility].  Family re-iterates that they want to avoid aggressive measures and labs/MRI being minimized. In speaking with Dr Marin Olp today, Maron has had DVT in past and bled on anticoag (lloks like hematuria and nose bleed per d/c summary) which may make Lovenox a problem.  He recommends Heparin gtt but certainly family request to minimize labs makes this a challenge.  Hopefully Dr Marin Olp can address. Even if doing heparin, our endpoint will be a challenge. Family states that they have considered hospice care in the past and Dr Marin Olp will discuss. I will follow peripherally, but if questions or concerns arise please feel free to contact me.  If decision is for residential hospice, Social Work handles this referral. If they wish to go home with hospice care this is handled through case management. I suspect he would qualify for residential hospice if decision is to stop anticoagulation (given history obleeding) and focus is on comfort measures only.   No Charge  Doran Clay D.O. Palliative Medicine Team at [facility]  Pager: [phone] Team Phone: [phone]

## 2014-08-17 NOTE — ED Notes (Signed)
Dr. Candiss Norse spoke with pt's dtr on the phone

## 2014-08-17 NOTE — ED Notes (Signed)
Pt's family requesting that no labs be drawn on pt unless absolutely necessary. Notifying admitting MD

## 2014-08-17 NOTE — ED Notes (Signed)
PA at bedside applying sutures.

## 2014-08-17 NOTE — Plan of Care (Signed)
Problem: Phase I Progression Outcomes Goal: Voiding-avoid urinary catheter unless indicated Outcome: Completed/Met Date Met:  08/17/14 Voiding independently

## 2014-08-17 NOTE — ED Provider Notes (Signed)
LACERATION REPAIR Performed by: Renold Genta Authorized by: Jeannett Senior A Consent: Verbal consent obtained. Risks and benefits: risks, benefits and alternatives were discussed Consent given by: patient Patient identity confirmed: provided demographic data Prepped and Draped in normal sterile fashion Wound explored  Laceration Location: left eyebrow  Laceration Length: 5cm  No Foreign Bodies seen or palpated  Anesthesia: local infiltration  Local anesthetic: lidocaine 2% qoepinephrine  Anesthetic total: 3 ml  Irrigation method: syringe Amount of cleaning: standard  Skin closure: prolene 5.0, vicril 5.0  Number of sutures: 7 skin, 2 sub cut  Technique: simple interrupted  Patient tolerance: Patient tolerated the procedure well with no immediate complications.   Renold Genta, PA-C 08/17/14 [place], MD 08/17/14 [phone]

## 2014-08-17 NOTE — ED Notes (Signed)
Stopped per verbal order from Dr. Candiss Norse. Notified pt's family

## 2014-08-17 NOTE — H&P (Addendum)
Patient Demographics  Gregory Farrell, is a 81 y.o. male  MRN: [id number]   DOB - [birth year]  Admit Date - 08/17/2014  Outpatient Primary MD for the patient is [name], MD   With History of -  Past Medical History  Diagnosis Date  . Hypertension   . COPD (chronic obstructive pulmonary disease)   . GERD (gastroesophageal reflux disease)   . Renal insufficiency   . Hyperlipidemia   . CAD (coronary artery disease)   . B12 deficiency   . Memory loss   . Vertigo   . Diabetes mellitus     type II, poly neuropathy  . Overweight 01/31/2013  . Lung cancer, hilus 03/22/2014      Past Surgical History  Procedure Laterality Date  . Coronary angioplasty with stent placement      more than 10 years ago  . Inguinal hernia repair  2010  . Video bronchoscopy Bilateral 03/07/2014    Procedure: VIDEO BRONCHOSCOPY WITHOUT FLUORO;  Surgeon: Kathee Delton, MD;  Location: [facility];  Service: Cardiopulmonary;  Laterality: Bilateral;  . Esophagogastroduodenoscopy N/A 05/12/2014    Procedure: ESOPHAGOGASTRODUODENOSCOPY (EGD);  Surgeon: Arta Silence, MD;  Location: [facility];  Service: Endoscopy;  Laterality: N/A;    in for   Chief Complaint  Patient presents with  . Loss of Consciousness  . Atrial Fibrillation     HPI  Gregory Farrell  is a 81 y.o. male,  history of COPD, lung cancer follows with Dr. Marin Olp, paroxysmal atrial fibrillation, diabetes mellitus type 2, dyslipidemia, GERD, essential hypertension who was going to the bathroom this morning at home, subsequently he passed out and fell on his 4 head, he did lose consciousness and had some fecal incontinence. He sustained a left for head laceration, came to the ER where workup was consistent with submassive PE with right heart strain, critical care was consulted  and they requested heparin drip and hospitalist admission.  Patient currently denies any headache chest or abdominal pain, no shortness of breath, no focal weakness. His head CT in the ER did show an incidental questionable small infarct. I discussed this with neurologist who recommended an baseline MRI. He has no focal complaints or deficits. He is currently symptom free.    Review of Systems  currently negative  In addition to the HPI above,   No Fever-chills, No Headache, No changes with Vision or hearing, No problems swallowing food or Liquids, No Chest pain, Cough or Shortness of Breath, No Abdominal pain, No Nausea or Vommitting, Bowel movements are regular, No Blood in stool or Urine, No dysuria, No new skin rashes or bruises, No new joints pains-aches,  No new weakness, tingling, numbness in any extremity, No recent weight gain or loss, No polyuria, polydypsia or polyphagia, No significant Mental Stressors.  A full 10 point Review of Systems was done, except as stated above, all other Review of Systems were negative.   Social  History History  Substance Use Topics  . Smoking status: Former Smoker -- 1.00 packs/day for 20 years    Types: Cigarettes    Start date: 09/09/1982    Quit date: 07/15/2002  . Smokeless tobacco: Never Used     Comment: quit smoking 11 years ago  . Alcohol Use: No      Family History Family History  Problem Relation Age of Onset  . Coronary artery disease Mother   . Breast cancer Mother   . Diabetes Mother   . Leukemia Brother       Prior to Admission medications   Medication Sig Start Date End Date Taking? Authorizing Provider  citalopram (CELEXA) 20 MG tablet Take 1 tablet (20 mg total) by mouth daily. Patient taking differently: Take 20 mg by mouth 2 (two) times daily.  05/23/14  Yes Barton Dubois, MD  insulin detemir (LEVEMIR) 100 UNIT/ML injection Inject 5 Units into the skin daily. No insulin in last week, d/t glucose readings.    Yes Historical Provider, MD  NOVOFINE 32G X 6 MM MISC use once daily with levemir 07/12/14  Yes Mosie Lukes, MD  pantoprazole (PROTONIX) 20 MG tablet Take 40 mg by mouth daily.    Yes Historical Provider, MD  tamsulosin (FLOMAX) 0.4 MG CAPS capsule TAKE ONE CAPSULE BY MOUTH ONCE DAILY AFTER SUPPER 08/03/14  Yes Volanda Napoleon, MD  zolpidem (AMBIEN) 5 MG tablet Take 1 tablet (5 mg total) by mouth at bedtime as needed for sleep. 07/12/14  Yes Volanda Napoleon, MD  albuterol (PROVENTIL HFA;VENTOLIN HFA) 108 (90 BASE) MCG/ACT inhaler Inhale 2 puffs into the lungs every 6 (six) hours as needed for wheezing or shortness of breath (cough). Patient not taking: Reported on 08/17/2014 06/21/14   Mosie Lukes, MD  lactulose (CHRONULAC) 10 GM/15ML solution Take 30 mLs (20 g total) by mouth 2 (two) times daily as needed for mild constipation or moderate constipation. Patient not taking: Reported on 08/17/2014 05/23/14   Barton Dubois, MD  methylphenidate (RITALIN) 10 MG tablet Take 1 tablet (10 mg total) by mouth 2 (two) times daily. Patient not taking: Reported on 08/17/2014 07/12/14   Volanda Napoleon, MD  pantoprazole sodium (PROTONIX) 40 mg/20 mL PACK Take 20 mLs (40 mg total) by mouth daily. Patient not taking: Reported on 08/17/2014 06/17/14   Volanda Napoleon, MD  zolpidem (AMBIEN) 5 MG tablet TAKE ONE TABLET BY MOUTH ONCE DAILY AT BEDTIME AS NEEDED FOR SLEEP Patient not taking: Reported on 08/17/2014 08/15/14   Volanda Napoleon, MD    Allergies  Allergen Reactions  . Exenatide     REACTION: nausea    Physical Exam  Vitals  Blood pressure 120/69, pulse 72, temperature 97.5 F (36.4 C), temperature source Oral, resp. rate 25, SpO2 94 %.   1. General elderly white male lying in bed in NAD,     2. Normal affect and insight, Not Suicidal or Homicidal, Awake Alert, Oriented X 3.  3. No F.N deficits, ALL C.Nerves Intact, Strength 5/5 all 4 extremities, Sensation intact all 4 extremities, Plantars down  going.  4. Ears and Eyes appear Normal, Conjunctivae clear, PERRLA. Moist Oral Mucosa. Left for head laceration sutured in the ER. About 3 cm in length.  5. Supple Neck, No JVD, No cervical lymphadenopathy appriciated, No Carotid Bruits.  6. Symmetrical Chest wall movement, Good air movement bilaterally, CTAB.  7. RRR, No Gallops, Rubs or Murmurs, No Parasternal Heave.  8. Positive Bowel Sounds, Abdomen  Soft, No tenderness, No organomegaly appriciated,No rebound -guarding or rigidity.  9.  No Cyanosis, Normal Skin Turgor, No Skin Rash or Bruise.  10. Good muscle tone,  joints appear normal , no effusions, Normal ROM.  11. No Palpable Lymph Nodes in Neck or Axillae     Data Review  CBC  Recent Labs Lab 08/17/14 0706 08/17/14 0752  WBC 10.4  --   HGB 13.3 18.0*  HCT 40.8 53.0*  PLT PLATELET CLUMPS NOTED ON SMEAR, UNABLE TO ESTIMATE  --   MCV 95.1  --   MCH 31.0  --   MCHC 32.6  --   RDW 15.0  --   LYMPHSABS 1.6  --   MONOABS 0.9  --   EOSABS 0.2  --   BASOSABS 0.0  --    ------------------------------------------------------------------------------------------------------------------  Chemistries   Recent Labs Lab 08/17/14 0706 08/17/14 0752  NA 135 138  K 3.2* 3.3*  CL 103 103  CO2 23  --   GLUCOSE 208* 206*  BUN 9 13  CREATININE 1.48* 1.30  CALCIUM 7.7*  --   AST 22  --   ALT 18  --   ALKPHOS 108  --   BILITOT 0.6  --    ------------------------------------------------------------------------------------------------------------------ estimated creatinine clearance is 44.6 mL/min (by C-G formula based on Cr of 1.3). ------------------------------------------------------------------------------------------------------------------ No results for input(s): TSH, T4TOTAL, T3FREE, THYROIDAB in the last 72 hours.  Invalid input(s): FREET3   Coagulation profile  Recent Labs Lab 08/17/14 0706  INR 1.07    ------------------------------------------------------------------------------------------------------------------- No results for input(s): DDIMER in the last 72 hours. -------------------------------------------------------------------------------------------------------------------  Cardiac Enzymes No results for input(s): CKMB, TROPONINI, MYOGLOBIN in the last 168 hours.  Invalid input(s): CK ------------------------------------------------------------------------------------------------------------------ Invalid input(s): POCBNP   ---------------------------------------------------------------------------------------------------------------  Urinalysis    Component Value Date/Time   COLORURINE YELLOW 08/17/2014 0740   APPEARANCEUR CLEAR 08/17/2014 0740   LABSPEC 1.015 08/17/2014 0740   PHURINE 5.0 08/17/2014 0740   GLUCOSEU NEGATIVE 08/17/2014 0740   HGBUR NEGATIVE 08/17/2014 0740   BILIRUBINUR NEGATIVE 08/17/2014 0740   KETONESUR NEGATIVE 08/17/2014 0740   PROTEINUR NEGATIVE 08/17/2014 0740   UROBILINOGEN 0.2 08/17/2014 0740   NITRITE NEGATIVE 08/17/2014 0740   LEUKOCYTESUR NEGATIVE 08/17/2014 0740    ----------------------------------------------------------------------------------------------------------------  Imaging results:   Ct Head Wo Contrast  08/17/2014   CLINICAL DATA:  Syncopal episode with resulting fall and left forehead/facial injury. Loss of consciousness. Initial encounter.  EXAM: CT HEAD WITHOUT CONTRAST  CT MAXILLOFACIAL WITHOUT CONTRAST  CT CERVICAL SPINE WITHOUT CONTRAST  TECHNIQUE: Multidetector CT imaging of the head, cervical spine, and maxillofacial structures were performed using the standard protocol without intravenous contrast. Multiplanar CT image reconstructions of the cervical spine and maxillofacial structures were also generated.  COMPARISON:  Head CT 03/17/2014.  FINDINGS: CT HEAD FINDINGS  There is no evidence of acute intracranial  hemorrhage, mass lesion, brain edema or extra-axial fluid collection. The ventricles and subarachnoid spaces are prominent but stable. There is new ill-defined low-density along the left aspect of the tentorium on images 11 and 12, probably reflecting encephalomalacia. This localizes to the superior aspect of the left cerebellum on the reformatted images from the maxillofacial CT. Otherwise, there are stable chronic small vessel ischemic changes in the periventricular white matter. No acute cortical based infarct identified. Intracranial vascular calcifications noted.  The visualized paranasal sinuses, mastoid air cells and middle ears are clear. Left frontal scalp soft tissue injury noted. There is no evidence of underlying calvarial fracture.  CT MAXILLOFACIAL FINDINGS  Left supraorbital soft tissue laceration associated with mild pre orbital soft tissue swelling medially in the left orbit. There is no postseptal inflammation. The globes are intact. The extra-ocular muscles and optic nerves are intact. The paranasal sinuses are clear without air-fluid levels.  No evidence of acute facial fracture.  CT CERVICAL SPINE FINDINGS  The cervical alignment is normal. There is no evidence of acute fracture or traumatic subluxation. Paraspinal osteophytes are noted. There is ossification of the ligamentum nuchae.  No acute soft tissue findings are evident. There are diffuse calcifications of the carotid arteries bilaterally. Left pleural effusion and left perihilar airspace disease are partially imaged.  IMPRESSION: 1. Left supraorbital and periorbital soft tissue injury. No evidence of orbital hematoma or maxillofacial fracture. 2. New low-density in the left superior cerebellum, most likely representing encephalomalacia from an interval infarct. This could be subacute. No evidence of acute intracranial hemorrhage. 3. No evidence of acute cervical spine fracture, traumatic subluxation or static signs of instability. 4.  Left pleural effusion and left suprahilar airspace disease. Thoracic findings are further described on the concurrent chest CT.   Electronically Signed   By: Camie Patience M.D.   On: 08/17/2014 09:55   Ct Angio Chest Pe W/cm &/or Wo Cm  08/17/2014   CLINICAL DATA:  81 year old male with syncopal episode, found down with loss of consciousness. Initial encounter. Current history of lung cancer.  EXAM: CT ANGIOGRAPHY CHEST  CT ABDOMEN AND PELVIS WITH CONTRAST  TECHNIQUE: Multidetector CT imaging of the chest was performed using the standard protocol during bolus administration of intravenous contrast. Multiplanar CT image reconstructions and MIPs were obtained to evaluate the vascular anatomy. Multidetector CT imaging of the abdomen and pelvis was performed using the standard protocol during bolus administration of intravenous contrast.  CONTRAST:  174mL OMNIPAQUE IOHEXOL 350 MG/ML SOLN  COMPARISON:  Portable chest radiograph 0658 hr today. Chest CTA 05/07/2014. PET-CT 03/08/2014.  FINDINGS: CTA CHEST FINDINGS  Good contrast bolus timing in the pulmonary arterial tree. Positive bilateral pulmonary artery filling defects. Confluent right upper lobe pulmonary embolus. Confluent left lower lobe pulmonary embolus. Right greater than left main pulmonary artery clot. No saddle embolus.  RV / LV ratio = 1.2  Moderate layering left pleural effusion. No pericardial effusion. Trace layering right pleural effusion.  Atelectatic changes to some of the major airways. Compressive atelectasis throughout the left lung. Continued abnormal soft tissue at the left hilum, but now partially obscured by the left hilar pulmonary thrombus. Left lower lobe consolidation with air bronchograms is increased. Nodular left lingula peribronchovascular opacity is new since October.  Stable right lung markings since October.  Right chest porta cath. Negative thoracic inlet. Aorta and coronary arteries with extensive calcified plaque.  Stable osseous  structures in the thorax.  CT ABDOMEN and PELVIS FINDINGS  No acute or suspicious osseous abnormality. Advanced degenerative changes at the right hip.  No pelvic free fluid. Diminutive bladder with layering density posteriorly on the left series 5, image 83. Stool in the rectum and sigmoid colon. Redundant sigmoid. Decompressed left colon, transverse colon and right colon. Diverticulosis of the right colon is stable. No active inflammation. No dilated small bowel. Stable mild gastric hiatal hernia. Decompressed stomach and duodenum. Stable liver, gallbladder, spleen, pancreas and right adrenal gland. Stable kidneys. The left adrenal nodule identified on 03/08/2014 enhances but is stable in size.  Portal venous system is patent. Extensive Aortoiliac calcified atherosclerosis noted. No abdominal free fluid. No free air.  Review of the MIP  images confirms the above findings.  IMPRESSION: 1. Positive for acute PE with CT evidence of right heartstrain (RV/LV Ratio = 1.2) consistent with at least submassive (intermediate risk)PE. The presence of right heart strain has been associated with anincreased risk of morbidity and mortality. Consultation with [place] is recommended. 2. Moderate layering left pleural effusion is new since October. Left lower lobe consolidation which in this setting may be pulmonary infarct. Increase nodular left perihilar opacity. 3. Left hilar peribronchial mass known to be lung cancer persists, and partially obscured by the acute findings today. 4. Indeterminate left adrenal nodule is stable. 5. Small left bladder calculus is new since 2015. No other acute findings in the abdomen or pelvis. Critical Value/emergent results were called by telephone at the time of interpretation on 08/17/2014 at 10:06 am to Dr. Ezequiel Essex , who verbally acknowledged these results.   Electronically Signed   By: Lars Pinks M.D.   On: 08/17/2014 10:07   Ct Cervical Spine Wo  Contrast  08/17/2014   CLINICAL DATA:  Syncopal episode with resulting fall and left forehead/facial injury. Loss of consciousness. Initial encounter.  EXAM: CT HEAD WITHOUT CONTRAST  CT MAXILLOFACIAL WITHOUT CONTRAST  CT CERVICAL SPINE WITHOUT CONTRAST  TECHNIQUE: Multidetector CT imaging of the head, cervical spine, and maxillofacial structures were performed using the standard protocol without intravenous contrast. Multiplanar CT image reconstructions of the cervical spine and maxillofacial structures were also generated.  COMPARISON:  Head CT 03/17/2014.  FINDINGS: CT HEAD FINDINGS  There is no evidence of acute intracranial hemorrhage, mass lesion, brain edema or extra-axial fluid collection. The ventricles and subarachnoid spaces are prominent but stable. There is new ill-defined low-density along the left aspect of the tentorium on images 11 and 12, probably reflecting encephalomalacia. This localizes to the superior aspect of the left cerebellum on the reformatted images from the maxillofacial CT. Otherwise, there are stable chronic small vessel ischemic changes in the periventricular white matter. No acute cortical based infarct identified. Intracranial vascular calcifications noted.  The visualized paranasal sinuses, mastoid air cells and middle ears are clear. Left frontal scalp soft tissue injury noted. There is no evidence of underlying calvarial fracture.  CT MAXILLOFACIAL FINDINGS  Left supraorbital soft tissue laceration associated with mild pre orbital soft tissue swelling medially in the left orbit. There is no postseptal inflammation. The globes are intact. The extra-ocular muscles and optic nerves are intact. The paranasal sinuses are clear without air-fluid levels.  No evidence of acute facial fracture.  CT CERVICAL SPINE FINDINGS  The cervical alignment is normal. There is no evidence of acute fracture or traumatic subluxation. Paraspinal osteophytes are noted. There is ossification of the  ligamentum nuchae.  No acute soft tissue findings are evident. There are diffuse calcifications of the carotid arteries bilaterally. Left pleural effusion and left perihilar airspace disease are partially imaged.  IMPRESSION: 1. Left supraorbital and periorbital soft tissue injury. No evidence of orbital hematoma or maxillofacial fracture. 2. New low-density in the left superior cerebellum, most likely representing encephalomalacia from an interval infarct. This could be subacute. No evidence of acute intracranial hemorrhage. 3. No evidence of acute cervical spine fracture, traumatic subluxation or static signs of instability. 4. Left pleural effusion and left suprahilar airspace disease. Thoracic findings are further described on the concurrent chest CT.   Electronically Signed   By: Camie Patience M.D.   On: 08/17/2014 09:55   Ct Abdomen Pelvis W Contrast  08/17/2014   CLINICAL DATA:  81 year old male with syncopal episode, found down with loss of consciousness. Initial encounter. Current history of lung cancer.  EXAM: CT ANGIOGRAPHY CHEST  CT ABDOMEN AND PELVIS WITH CONTRAST  TECHNIQUE: Multidetector CT imaging of the chest was performed using the standard protocol during bolus administration of intravenous contrast. Multiplanar CT image reconstructions and MIPs were obtained to evaluate the vascular anatomy. Multidetector CT imaging of the abdomen and pelvis was performed using the standard protocol during bolus administration of intravenous contrast.  CONTRAST:  168mL OMNIPAQUE IOHEXOL 350 MG/ML SOLN  COMPARISON:  Portable chest radiograph 0658 hr today. Chest CTA 05/07/2014. PET-CT 03/08/2014.  FINDINGS: CTA CHEST FINDINGS  Good contrast bolus timing in the pulmonary arterial tree. Positive bilateral pulmonary artery filling defects. Confluent right upper lobe pulmonary embolus. Confluent left lower lobe pulmonary embolus. Right greater than left main pulmonary artery clot. No saddle embolus.  RV / LV ratio =  1.2  Moderate layering left pleural effusion. No pericardial effusion. Trace layering right pleural effusion.  Atelectatic changes to some of the major airways. Compressive atelectasis throughout the left lung. Continued abnormal soft tissue at the left hilum, but now partially obscured by the left hilar pulmonary thrombus. Left lower lobe consolidation with air bronchograms is increased. Nodular left lingula peribronchovascular opacity is new since October.  Stable right lung markings since October.  Right chest porta cath. Negative thoracic inlet. Aorta and coronary arteries with extensive calcified plaque.  Stable osseous structures in the thorax.  CT ABDOMEN and PELVIS FINDINGS  No acute or suspicious osseous abnormality. Advanced degenerative changes at the right hip.  No pelvic free fluid. Diminutive bladder with layering density posteriorly on the left series 5, image 83. Stool in the rectum and sigmoid colon. Redundant sigmoid. Decompressed left colon, transverse colon and right colon. Diverticulosis of the right colon is stable. No active inflammation. No dilated small bowel. Stable mild gastric hiatal hernia. Decompressed stomach and duodenum. Stable liver, gallbladder, spleen, pancreas and right adrenal gland. Stable kidneys. The left adrenal nodule identified on 03/08/2014 enhances but is stable in size.  Portal venous system is patent. Extensive Aortoiliac calcified atherosclerosis noted. No abdominal free fluid. No free air.  Review of the MIP images confirms the above findings.  IMPRESSION: 1. Positive for acute PE with CT evidence of right heartstrain (RV/LV Ratio = 1.2) consistent with at least submassive (intermediate risk)PE. The presence of right heart strain has been associated with anincreased risk of morbidity and mortality. Consultation with [facility] is recommended. 2. Moderate layering left pleural effusion is new since October. Left lower lobe consolidation which  in this setting may be pulmonary infarct. Increase nodular left perihilar opacity. 3. Left hilar peribronchial mass known to be lung cancer persists, and partially obscured by the acute findings today. 4. Indeterminate left adrenal nodule is stable. 5. Small left bladder calculus is new since 2015. No other acute findings in the abdomen or pelvis. Critical Value/emergent results were called by telephone at the time of interpretation on 08/17/2014 at 10:06 am to Dr. Ezequiel Essex , who verbally acknowledged these results.   Electronically Signed   By: Lars Pinks M.D.   On: 08/17/2014 10:07   Dg Chest Portable 1 View  08/17/2014   CLINICAL DATA:  81 year old male with loss of consciousness, head injury. Atrial fibrillation Initial encounter. Current history of lung cancer.  EXAM: PORTABLE CHEST - 1 VIEW  COMPARISON:  08/09/2014 and earlier.  FINDINGS: Portable AP semi upright view at 0658 hrs.  Stable right chest porta cath. Stable lung volumes. No pneumothorax. No pulmonary edema. Bibasilar pleural scarring or small effusions, unchanged. Nodular opacity about the left hilum also stable. No areas of worsening ventilation. Stable cardiac size and mediastinal contours.  IMPRESSION: 1. Stable radiographic appearance of the chest. See recommendations on the recent radiographs of 08/09/2014. 2. No new cardiopulmonary abnormality.   Electronically Signed   By: Lars Pinks M.D.   On: 08/17/2014 07:20   Ct Maxillofacial Wo Cm  08/17/2014   CLINICAL DATA:  Syncopal episode with resulting fall and left forehead/facial injury. Loss of consciousness. Initial encounter.  EXAM: CT HEAD WITHOUT CONTRAST  CT MAXILLOFACIAL WITHOUT CONTRAST  CT CERVICAL SPINE WITHOUT CONTRAST  TECHNIQUE: Multidetector CT imaging of the head, cervical spine, and maxillofacial structures were performed using the standard protocol without intravenous contrast. Multiplanar CT image reconstructions of the cervical spine and maxillofacial structures were  also generated.  COMPARISON:  Head CT 03/17/2014.  FINDINGS: CT HEAD FINDINGS  There is no evidence of acute intracranial hemorrhage, mass lesion, brain edema or extra-axial fluid collection. The ventricles and subarachnoid spaces are prominent but stable. There is new ill-defined low-density along the left aspect of the tentorium on images 11 and 12, probably reflecting encephalomalacia. This localizes to the superior aspect of the left cerebellum on the reformatted images from the maxillofacial CT. Otherwise, there are stable chronic small vessel ischemic changes in the periventricular white matter. No acute cortical based infarct identified. Intracranial vascular calcifications noted.  The visualized paranasal sinuses, mastoid air cells and middle ears are clear. Left frontal scalp soft tissue injury noted. There is no evidence of underlying calvarial fracture.  CT MAXILLOFACIAL FINDINGS  Left supraorbital soft tissue laceration associated with mild pre orbital soft tissue swelling medially in the left orbit. There is no postseptal inflammation. The globes are intact. The extra-ocular muscles and optic nerves are intact. The paranasal sinuses are clear without air-fluid levels.  No evidence of acute facial fracture.  CT CERVICAL SPINE FINDINGS  The cervical alignment is normal. There is no evidence of acute fracture or traumatic subluxation. Paraspinal osteophytes are noted. There is ossification of the ligamentum nuchae.  No acute soft tissue findings are evident. There are diffuse calcifications of the carotid arteries bilaterally. Left pleural effusion and left perihilar airspace disease are partially imaged.  IMPRESSION: 1. Left supraorbital and periorbital soft tissue injury. No evidence of orbital hematoma or maxillofacial fracture. 2. New low-density in the left superior cerebellum, most likely representing encephalomalacia from an interval infarct. This could be subacute. No evidence of acute intracranial  hemorrhage. 3. No evidence of acute cervical spine fracture, traumatic subluxation or static signs of instability. 4. Left pleural effusion and left suprahilar airspace disease. Thoracic findings are further described on the concurrent chest CT.   Electronically Signed   By: Camie Patience M.D.   On: 08/17/2014 09:55    My personal review of EKG: Rhythm NSR, LAFB-RBBB,   no Acute ST changes    Assessment & Plan   1. Syncope or loss of consciousness due to submassive PE with right heart strain. Will be admitted to telemetry bed, has been seen by per medical care, heparin drip, not check lower extremity duplex as this will not change management, he did have recent right upper arm DVT. If stable could be transitioned to Eliquis or xaralto. He is already hypercoagulable due to lung cancer.    2. Pleural effusion in a patient with history of lung cancer. Follows  with Dr. Lutricia Feil his oncologist, pulmonary has already seen the patient here. Outpatient follow-up with pulmonary and oncology postdischarge.     3. DM type II. Check A1c, home dose Levemir at sliding scale.    4. Hypokalemia. Replace.    5. Polycythemia, repeat CBC in the morning. Initial CBC was stable.    6. BPH. Continue Flomax.    7. Questionable small infarct on CT scan. I discussed the case with neurologist on call doctor Camillo, since patient is not having any focal symptoms or signs we'll check a baseline MRI to confirm if this is acute CVA. For now he'll be on heparin drip. If new stroke is found stroke workup.    8. Generalized weakness. PT eval. Medical placement.    9. Left forehead laceration. Sutured in the ER. Local skin care.    10. COPD. No wheezing. Supportive care with nebulizer treatments and oxygen as needed.     DVT Prophylaxis Heparin gtt & SCDs   AM Labs Ordered, also please review Full Orders    Addendum. Family requested no more labs to be drawn, no MRI,  unless extremely  necessary, will switch from heparin to Lovenox. Will call palliative care to address cause of care. D/W Daughter gentle medical treatment, minimize lab draws. If declines full hospice.    Family Communication: Admission, patients condition and plan of care including tests being ordered have been discussed with the patient  who indicates understanding and agree with the plan and Code Status.  Code Status DNR  Likely DC to  TBD  Condition GUARDED    Time spent in minutes : 35    SINGH,PRASHANT K M.D on 08/17/2014 at 11:20 AM  Between 7am to 7pm - Pager - [phone]  After 7pm go to [email or web address] - [facility] Office  [phone]

## 2014-08-17 NOTE — Progress Notes (Signed)
ANTICOAGULATION CONSULT NOTE - Follow-up Consult  Pharmacy Consult for lovenox ->heparin Indication: pulmonary embolus  Allergies  Allergen Reactions  . Exenatide     REACTION: nausea    Patient Measurements: Height: 5\' 8"  (172.7 cm) Weight: 166 lb (75.297 kg) IBW/kg (Calculated) : 68.4 Heparin Dosing Weight: 73 kg  Vital Signs: Temp: 98.2 F (36.8 C) (02/03 1503) Temp Source: Oral (02/03 1503) BP: 124/69 mmHg (02/03 1503) Pulse Rate: 75 (02/03 1503)  Labs:  Recent Labs  08/17/14 0706 08/17/14 0752  HGB 13.3 18.0*  HCT 40.8 53.0*  PLT PLATELET CLUMPS NOTED ON SMEAR, UNABLE TO ESTIMATE  --   LABPROT 14.1  --   INR 1.07  --   CREATININE 1.48* 1.30    Estimated Creatinine Clearance: 44.6 mL/min (by C-G formula based on Cr of 1.3).  Assessment: 81 yo male initially found unresponsive and incontinent.  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  Last received chemotherapy in fall of 2015. CTA showing bilateral submassive PE with right heart strain. Pt started on heparin but then transitioned to Lovenox q12h earlier today - dose given ~1430. Now orders to transition back to heparin as pt may need additional invasive procedures.   Noted h/o hematuria/nosebleed when on anticoagulation in the past for DVT.  Noted palliative care note that states to minimize labs which will be difficult with heparin gtt.  Goal of Therapy:  Heparin level 0.3-0.7 units/ml Monitor platelets by anticoagulation protocol: Yes   Plan:  At 2230 (~8hr past Lovenox dose), restart Heparin gtt at 1150 units/hr. No bolus. Will check 8 hour heparin level Daily CBC, heparin level F/u care decisions  Sherlon Handing, PharmD, BCPS Clinical pharmacist, pager [phone] 08/17/2014 5:06 PM

## 2014-08-17 NOTE — ED Notes (Signed)
Pt's dtr refusing for pt to go to MRI at this time. Pt's dtr states that he does not tolerate this well due to his neck pain. Notified Dr. Candiss Norse

## 2014-08-17 NOTE — Progress Notes (Signed)
Patient bladder scanned 382, then he voided 100 ml denies discomfort, Dr.Singh made aware, per patient and daughter, patient has not voided since being at hospital until just now. Will continue to monitor  Joylene Draft A

## 2014-08-17 NOTE — ED Notes (Signed)
Pt defecated on himself, gave patient bedbath and dressed lacerations on right and left arms. Pt completely A/O X's 4.

## 2014-08-17 NOTE — ED Notes (Signed)
MD at bedside. 

## 2014-08-17 NOTE — Progress Notes (Addendum)
ANTICOAGULATION CONSULT NOTE - Initial Consult  Pharmacy Consult for heparin Indication: pulmonary embolus  Allergies  Allergen Reactions  . Exenatide     REACTION: nausea    Patient Measurements:  IBW: 68.4 kg Heparin Dosing Weight: 73 kg  Vital Signs: Temp: 97.5 F (36.4 C) (02/03 0656) Temp Source: Oral (02/03 0656) BP: 107/59 mmHg (02/03 0815) Pulse Rate: 80 (02/03 0815)  Labs:  Recent Labs  08/17/14 0706 08/17/14 0752  HGB 13.3 18.0*  HCT 40.8 53.0*  PLT PLATELET CLUMPS NOTED ON SMEAR, UNABLE TO ESTIMATE  --   LABPROT 14.1  --   INR 1.07  --   CREATININE 1.48* 1.30    Estimated Creatinine Clearance: 44.6 mL/min (by C-G formula based on Cr of 1.3).   Medical History: Past Medical History  Diagnosis Date  . Hypertension   . COPD (chronic obstructive pulmonary disease)   . GERD (gastroesophageal reflux disease)   . Renal insufficiency   . Hyperlipidemia   . CAD (coronary artery disease)   . B12 deficiency   . Memory loss   . Vertigo   . Diabetes mellitus     type II, poly neuropathy  . Overweight 01/31/2013  . Lung cancer, hilus 03/22/2014    Medications:  See EMR  Assessment: 81 yo male initially found unresponsive and incontinent.  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  Last received chemotherapy in fall of 2015. Pt was in AFib in route to ED. CTA showing bilateral submassive PE with right heart strain. Pt is not on any anticoagulants at home, CBC is stable, baseline INR 1.  Goal of Therapy:  Heparin level 0.3-0.7 units/ml Monitor platelets by anticoagulation protocol: Yes   Plan:  Heparin bolus 4000 units x1 Heparin gtt 1150 units/hr Daily HL, CBC F/u transition to PO anticoagulation    Hughes Better, PharmD, BCPS Clinical Pharmacist Pager: [phone] 08/17/2014 10:17 AM    Addendum -Transitioning to Lovenox -Lovenox 75 mg [place] q12h, begin 1 hour after heparin gtt was stopped -CBC 172h -Monitor closely for s/sx  bleeding   Harvel Quale 08/17/2014 1:30 PM

## 2014-08-17 NOTE — ED Notes (Signed)
Pt is from home, pt's wife found the pt unresponsive and incontinent on the ground, pt reports he "blacked out and then woke up when he was with EMS", EMS reports pt has a laceration to his forehead, skin tear to his left elbow and left wrist. EMS reports they sat the pt up and his heart rate dropped significantly. EMS placed a nasal trumpet and placed on NRB at 15L/min. Pt A&O X4 upon arrival to department, EMS reports pt was only oriented to self upon their arrival. CBG 206. EMS reports Afib with a rate of 158 en route to department.

## 2014-08-17 NOTE — ED Provider Notes (Addendum)
CSN: [id number]     Arrival date & time 08/17/14  6433 History   First MD Initiated Contact with Patient 08/17/14 [phone]     Chief Complaint  Patient presents with  . Loss of Consciousness  . Atrial Fibrillation     (Consider location/radiation/quality/duration/timing/severity/associated sxs/prior Treatment) HPI Comments: Patient from home after syncopal episode. He states he went to the bathroom sitting on the toilet and the next thing he knows he was on the ground EMS with surrounding him. His wife called EMS. He endorses some shortness of breath but denies any chest pain. He denies any preceding dizziness or lightheadedness. He was found unresponsive and incontinent of urine. He has a laceration to his left eyebrow, left elbow and right forearm. EMS found him to be atrial fibrillation with RVR but he has since converted to sinus rhythm. Patient has a history of lung cancer he reports that it is improving. He has a history of renal insufficiency, CAD, COPD, hypertension. He denies any nausea or vomiting. He denies any fevers or chills. He denies any blood in his stools. EMS attempted to sit him up and he became bradycardic which improved with lying back down.  The history is provided by the patient and the EMS personnel. The history is limited by the condition of the patient.    Past Medical History  Diagnosis Date  . Hypertension   . COPD (chronic obstructive pulmonary disease)   . GERD (gastroesophageal reflux disease)   . Renal insufficiency   . Hyperlipidemia   . CAD (coronary artery disease)   . B12 deficiency   . Memory loss     "a little" (08/17/2014)  . Vertigo   . Overweight 01/31/2013  . DVT (deep venous thrombosis) 04/2014  . PE (pulmonary embolism) 08/17/2014  . Type II diabetes mellitus     type II, poly neuropathy  . History of blood transfusion 04/2014    "perk him up"  . Lung cancer, hilus 03/22/2014   Past Surgical History  Procedure Laterality Date  . Video  bronchoscopy Bilateral 03/07/2014    Procedure: VIDEO BRONCHOSCOPY WITHOUT FLUORO;  Surgeon: Kathee Delton, MD;  Location: [facility];  Service: Cardiopulmonary;  Laterality: Bilateral;  . Esophagogastroduodenoscopy N/A 05/12/2014    Procedure: ESOPHAGOGASTRODUODENOSCOPY (EGD);  Surgeon: Arta Silence, MD;  Location: [facility];  Service: Endoscopy;  Laterality: N/A;  . Inguinal hernia repair Right 2010  . Coronary angioplasty with stent placement  "early 2000's"    "2"  . Cataract extraction w/ intraocular lens  implant, bilateral Bilateral    Family History  Problem Relation Age of Onset  . Coronary artery disease Mother   . Breast cancer Mother   . Diabetes Mother   . Leukemia Brother    History  Substance Use Topics  . Smoking status: Former Smoker -- 1.00 packs/day for 20 years    Types: Cigarettes    Start date: 09/09/1982    Quit date: 07/15/2002  . Smokeless tobacco: Never Used  . Alcohol Use: No    Review of Systems  Constitutional: Positive for activity change, appetite change and fatigue. Negative for fever.  HENT: Negative for congestion and rhinorrhea.   Respiratory: Negative for cough, chest tightness and shortness of breath.   Cardiovascular: Positive for syncope. Negative for chest pain.  Gastrointestinal: Negative for nausea, vomiting and abdominal pain.  Genitourinary: Negative for dysuria, hematuria and decreased urine volume.  Musculoskeletal: Negative for myalgias and arthralgias.  Skin: Negative for rash.  Neurological: Positive  for syncope and weakness.  A complete 10 system review of systems was obtained and all systems are negative except as noted in the HPI and PMH.      Allergies  Exenatide  Home Medications   Prior to Admission medications   Medication Sig Start Date End Date Taking? Authorizing Provider  citalopram (CELEXA) 20 MG tablet Take 1 tablet (20 mg total) by mouth daily. Patient taking differently: Take 20 mg by mouth 2 (two)  times daily.  05/23/14  Yes Barton Dubois, MD  insulin detemir (LEVEMIR) 100 UNIT/ML injection Inject 5 Units into the skin daily. No insulin in last week, d/t glucose readings.   Yes Historical Provider, MD  NOVOFINE 32G X 6 MM MISC use once daily with levemir 07/12/14  Yes Mosie Lukes, MD  pantoprazole (PROTONIX) 20 MG tablet Take 40 mg by mouth daily.    Yes Historical Provider, MD  tamsulosin (FLOMAX) 0.4 MG CAPS capsule TAKE ONE CAPSULE BY MOUTH ONCE DAILY AFTER SUPPER 08/03/14  Yes Volanda Napoleon, MD  zolpidem (AMBIEN) 5 MG tablet Take 1 tablet (5 mg total) by mouth at bedtime as needed for sleep. 07/12/14  Yes Volanda Napoleon, MD  albuterol (PROVENTIL HFA;VENTOLIN HFA) 108 (90 BASE) MCG/ACT inhaler Inhale 2 puffs into the lungs every 6 (six) hours as needed for wheezing or shortness of breath (cough). Patient not taking: Reported on 08/17/2014 06/21/14   Mosie Lukes, MD  lactulose (CHRONULAC) 10 GM/15ML solution Take 30 mLs (20 g total) by mouth 2 (two) times daily as needed for mild constipation or moderate constipation. Patient not taking: Reported on 08/17/2014 05/23/14   Barton Dubois, MD  methylphenidate (RITALIN) 10 MG tablet Take 1 tablet (10 mg total) by mouth 2 (two) times daily. Patient not taking: Reported on 08/17/2014 07/12/14   Volanda Napoleon, MD  pantoprazole sodium (PROTONIX) 40 mg/20 mL PACK Take 20 mLs (40 mg total) by mouth daily. Patient not taking: Reported on 08/17/2014 06/17/14   Volanda Napoleon, MD  zolpidem (AMBIEN) 5 MG tablet TAKE ONE TABLET BY MOUTH ONCE DAILY AT BEDTIME AS NEEDED FOR SLEEP Patient not taking: Reported on 08/17/2014 08/15/14   Volanda Napoleon, MD   BP 124/69 mmHg  Pulse 75  Temp(Src) 98.2 F (36.8 C) (Oral)  Resp 18  Ht 5\' 8"  (1.727 m)  Wt 166 lb (75.297 kg)  BMI 25.25 kg/m2  SpO2 98% Physical Exam  Constitutional: He is oriented to person, place, and time. He appears well-developed and well-nourished. No distress.  HENT:  Head: Normocephalic.   Mouth/Throat: Oropharynx is clear and moist. No oropharyngeal exudate.  Stellate 3 cm laceration to left eyebrow, EOMI  intact, no step-offs. Slight weakness with L eyebrow raise and forehead wrinkling  Eyes: Conjunctivae and EOM are normal. Pupils are equal, round, and reactive to light.  Neck: Normal range of motion. Neck supple.  No C-spine tenderness  Cardiovascular: Normal rate, regular rhythm, normal heart sounds and intact distal pulses.   No murmur heard. Equal femoral pulses  Pulmonary/Chest: Effort normal and breath sounds normal. No respiratory distress.  Abdominal: Soft. There is no tenderness. There is no rebound and no guarding.  Musculoskeletal: Normal range of motion. He exhibits tenderness. He exhibits no edema.  Skin tear L elbow without bony tenderness Skin tear R forearm  Neurological: He is alert and oriented to person, place, and time. No cranial nerve deficit. He exhibits normal muscle tone. Coordination normal.  No ataxia on finger to  nose bilaterally. No pronator drift. 5/5 strength throughout. CN 2-12 intact.Equal grip strength. Sensation intact.   Skin: Skin is warm.  Psychiatric: He has a normal mood and affect. His behavior is normal.  Nursing note and vitals reviewed.   ED Course  Procedures (including critical care time) Labs Review Labs Reviewed  COMPREHENSIVE METABOLIC PANEL - Abnormal; Notable for the following:    Potassium 3.2 (*)    Glucose, Bld 208 (*)    Creatinine, Ser 1.48 (*)    Calcium 7.7 (*)    Total Protein 5.7 (*)    Albumin 2.0 (*)    GFR calc non Af Amer 43 (*)    GFR calc Af Amer 50 (*)    All other components within normal limits  BRAIN NATRIURETIC PEPTIDE - Abnormal; Notable for the following:    B Natriuretic Peptide 272.1 (*)    All other components within normal limits  GLUCOSE, CAPILLARY - Abnormal; Notable for the following:    Glucose-Capillary 138 (*)    All other components within normal limits  CBG MONITORING, ED -  Abnormal; Notable for the following:    Glucose-Capillary 185 (*)    All other components within normal limits  I-STAT CHEM 8, ED - Abnormal; Notable for the following:    Potassium 3.3 (*)    Glucose, Bld 206 (*)    Calcium, Ion 1.03 (*)    Hemoglobin 18.0 (*)    HCT 53.0 (*)    All other components within normal limits  CBC WITH DIFFERENTIAL/PLATELET  PROTIME-INR  URINALYSIS, ROUTINE W REFLEX MICROSCOPIC  HEMOGLOBIN B5D  BASIC METABOLIC PANEL  CBC  PREALBUMIN  I-STAT TROPOININ, ED    Imaging Review Ct Head Wo Contrast  08/17/2014   CLINICAL DATA:  Syncopal episode with resulting fall and left forehead/facial injury. Loss of consciousness. Initial encounter.  EXAM: CT HEAD WITHOUT CONTRAST  CT MAXILLOFACIAL WITHOUT CONTRAST  CT CERVICAL SPINE WITHOUT CONTRAST  TECHNIQUE: Multidetector CT imaging of the head, cervical spine, and maxillofacial structures were performed using the standard protocol without intravenous contrast. Multiplanar CT image reconstructions of the cervical spine and maxillofacial structures were also generated.  COMPARISON:  Head CT 03/17/2014.  FINDINGS: CT HEAD FINDINGS  There is no evidence of acute intracranial hemorrhage, mass lesion, brain edema or extra-axial fluid collection. The ventricles and subarachnoid spaces are prominent but stable. There is new ill-defined low-density along the left aspect of the tentorium on images 11 and 12, probably reflecting encephalomalacia. This localizes to the superior aspect of the left cerebellum on the reformatted images from the maxillofacial CT. Otherwise, there are stable chronic small vessel ischemic changes in the periventricular white matter. No acute cortical based infarct identified. Intracranial vascular calcifications noted.  The visualized paranasal sinuses, mastoid air cells and middle ears are clear. Left frontal scalp soft tissue injury noted. There is no evidence of underlying calvarial fracture.  CT MAXILLOFACIAL  FINDINGS  Left supraorbital soft tissue laceration associated with mild pre orbital soft tissue swelling medially in the left orbit. There is no postseptal inflammation. The globes are intact. The extra-ocular muscles and optic nerves are intact. The paranasal sinuses are clear without air-fluid levels.  No evidence of acute facial fracture.  CT CERVICAL SPINE FINDINGS  The cervical alignment is normal. There is no evidence of acute fracture or traumatic subluxation. Paraspinal osteophytes are noted. There is ossification of the ligamentum nuchae.  No acute soft tissue findings are evident. There are diffuse calcifications of the carotid  arteries bilaterally. Left pleural effusion and left perihilar airspace disease are partially imaged.  IMPRESSION: 1. Left supraorbital and periorbital soft tissue injury. No evidence of orbital hematoma or maxillofacial fracture. 2. New low-density in the left superior cerebellum, most likely representing encephalomalacia from an interval infarct. This could be subacute. No evidence of acute intracranial hemorrhage. 3. No evidence of acute cervical spine fracture, traumatic subluxation or static signs of instability. 4. Left pleural effusion and left suprahilar airspace disease. Thoracic findings are further described on the concurrent chest CT.   Electronically Signed   By: Camie Patience M.D.   On: 08/17/2014 09:55   Ct Angio Chest Pe W/cm &/or Wo Cm  08/17/2014   CLINICAL DATA:  81 year old male with syncopal episode, found down with loss of consciousness. Initial encounter. Current history of lung cancer.  EXAM: CT ANGIOGRAPHY CHEST  CT ABDOMEN AND PELVIS WITH CONTRAST  TECHNIQUE: Multidetector CT imaging of the chest was performed using the standard protocol during bolus administration of intravenous contrast. Multiplanar CT image reconstructions and MIPs were obtained to evaluate the vascular anatomy. Multidetector CT imaging of the abdomen and pelvis was performed using the  standard protocol during bolus administration of intravenous contrast.  CONTRAST:  136mL OMNIPAQUE IOHEXOL 350 MG/ML SOLN  COMPARISON:  Portable chest radiograph 0658 hr today. Chest CTA 05/07/2014. PET-CT 03/08/2014.  FINDINGS: CTA CHEST FINDINGS  Good contrast bolus timing in the pulmonary arterial tree. Positive bilateral pulmonary artery filling defects. Confluent right upper lobe pulmonary embolus. Confluent left lower lobe pulmonary embolus. Right greater than left main pulmonary artery clot. No saddle embolus.  RV / LV ratio = 1.2  Moderate layering left pleural effusion. No pericardial effusion. Trace layering right pleural effusion.  Atelectatic changes to some of the major airways. Compressive atelectasis throughout the left lung. Continued abnormal soft tissue at the left hilum, but now partially obscured by the left hilar pulmonary thrombus. Left lower lobe consolidation with air bronchograms is increased. Nodular left lingula peribronchovascular opacity is new since October.  Stable right lung markings since October.  Right chest porta cath. Negative thoracic inlet. Aorta and coronary arteries with extensive calcified plaque.  Stable osseous structures in the thorax.  CT ABDOMEN and PELVIS FINDINGS  No acute or suspicious osseous abnormality. Advanced degenerative changes at the right hip.  No pelvic free fluid. Diminutive bladder with layering density posteriorly on the left series 5, image 83. Stool in the rectum and sigmoid colon. Redundant sigmoid. Decompressed left colon, transverse colon and right colon. Diverticulosis of the right colon is stable. No active inflammation. No dilated small bowel. Stable mild gastric hiatal hernia. Decompressed stomach and duodenum. Stable liver, gallbladder, spleen, pancreas and right adrenal gland. Stable kidneys. The left adrenal nodule identified on 03/08/2014 enhances but is stable in size.  Portal venous system is patent. Extensive Aortoiliac calcified  atherosclerosis noted. No abdominal free fluid. No free air.  Review of the MIP images confirms the above findings.  IMPRESSION: 1. Positive for acute PE with CT evidence of right heartstrain (RV/LV Ratio = 1.2) consistent with at least submassive (intermediate risk)PE. The presence of right heart strain has been associated with anincreased risk of morbidity and mortality. Consultation with [facility] is recommended. 2. Moderate layering left pleural effusion is new since October. Left lower lobe consolidation which in this setting may be pulmonary infarct. Increase nodular left perihilar opacity. 3. Left hilar peribronchial mass known to be lung cancer persists, and partially obscured by the acute  findings today. 4. Indeterminate left adrenal nodule is stable. 5. Small left bladder calculus is new since 2015. No other acute findings in the abdomen or pelvis. Critical Value/emergent results were called by telephone at the time of interpretation on 08/17/2014 at 10:06 am to Dr. Ezequiel Essex , who verbally acknowledged these results.   Electronically Signed   By: Lars Pinks M.D.   On: 08/17/2014 10:07   Ct Cervical Spine Wo Contrast  08/17/2014   CLINICAL DATA:  Syncopal episode with resulting fall and left forehead/facial injury. Loss of consciousness. Initial encounter.  EXAM: CT HEAD WITHOUT CONTRAST  CT MAXILLOFACIAL WITHOUT CONTRAST  CT CERVICAL SPINE WITHOUT CONTRAST  TECHNIQUE: Multidetector CT imaging of the head, cervical spine, and maxillofacial structures were performed using the standard protocol without intravenous contrast. Multiplanar CT image reconstructions of the cervical spine and maxillofacial structures were also generated.  COMPARISON:  Head CT 03/17/2014.  FINDINGS: CT HEAD FINDINGS  There is no evidence of acute intracranial hemorrhage, mass lesion, brain edema or extra-axial fluid collection. The ventricles and subarachnoid spaces are prominent but stable. There is  new ill-defined low-density along the left aspect of the tentorium on images 11 and 12, probably reflecting encephalomalacia. This localizes to the superior aspect of the left cerebellum on the reformatted images from the maxillofacial CT. Otherwise, there are stable chronic small vessel ischemic changes in the periventricular white matter. No acute cortical based infarct identified. Intracranial vascular calcifications noted.  The visualized paranasal sinuses, mastoid air cells and middle ears are clear. Left frontal scalp soft tissue injury noted. There is no evidence of underlying calvarial fracture.  CT MAXILLOFACIAL FINDINGS  Left supraorbital soft tissue laceration associated with mild pre orbital soft tissue swelling medially in the left orbit. There is no postseptal inflammation. The globes are intact. The extra-ocular muscles and optic nerves are intact. The paranasal sinuses are clear without air-fluid levels.  No evidence of acute facial fracture.  CT CERVICAL SPINE FINDINGS  The cervical alignment is normal. There is no evidence of acute fracture or traumatic subluxation. Paraspinal osteophytes are noted. There is ossification of the ligamentum nuchae.  No acute soft tissue findings are evident. There are diffuse calcifications of the carotid arteries bilaterally. Left pleural effusion and left perihilar airspace disease are partially imaged.  IMPRESSION: 1. Left supraorbital and periorbital soft tissue injury. No evidence of orbital hematoma or maxillofacial fracture. 2. New low-density in the left superior cerebellum, most likely representing encephalomalacia from an interval infarct. This could be subacute. No evidence of acute intracranial hemorrhage. 3. No evidence of acute cervical spine fracture, traumatic subluxation or static signs of instability. 4. Left pleural effusion and left suprahilar airspace disease. Thoracic findings are further described on the concurrent chest CT.   Electronically  Signed   By: Camie Patience M.D.   On: 08/17/2014 09:55   Ct Abdomen Pelvis W Contrast  08/17/2014   CLINICAL DATA:  81 year old male with syncopal episode, found down with loss of consciousness. Initial encounter. Current history of lung cancer.  EXAM: CT ANGIOGRAPHY CHEST  CT ABDOMEN AND PELVIS WITH CONTRAST  TECHNIQUE: Multidetector CT imaging of the chest was performed using the standard protocol during bolus administration of intravenous contrast. Multiplanar CT image reconstructions and MIPs were obtained to evaluate the vascular anatomy. Multidetector CT imaging of the abdomen and pelvis was performed using the standard protocol during bolus administration of intravenous contrast.  CONTRAST:  176mL OMNIPAQUE IOHEXOL 350 MG/ML SOLN  COMPARISON:  Portable chest  radiograph 0658 hr today. Chest CTA 05/07/2014. PET-CT 03/08/2014.  FINDINGS: CTA CHEST FINDINGS  Good contrast bolus timing in the pulmonary arterial tree. Positive bilateral pulmonary artery filling defects. Confluent right upper lobe pulmonary embolus. Confluent left lower lobe pulmonary embolus. Right greater than left main pulmonary artery clot. No saddle embolus.  RV / LV ratio = 1.2  Moderate layering left pleural effusion. No pericardial effusion. Trace layering right pleural effusion.  Atelectatic changes to some of the major airways. Compressive atelectasis throughout the left lung. Continued abnormal soft tissue at the left hilum, but now partially obscured by the left hilar pulmonary thrombus. Left lower lobe consolidation with air bronchograms is increased. Nodular left lingula peribronchovascular opacity is new since October.  Stable right lung markings since October.  Right chest porta cath. Negative thoracic inlet. Aorta and coronary arteries with extensive calcified plaque.  Stable osseous structures in the thorax.  CT ABDOMEN and PELVIS FINDINGS  No acute or suspicious osseous abnormality. Advanced degenerative changes at the right hip.   No pelvic free fluid. Diminutive bladder with layering density posteriorly on the left series 5, image 83. Stool in the rectum and sigmoid colon. Redundant sigmoid. Decompressed left colon, transverse colon and right colon. Diverticulosis of the right colon is stable. No active inflammation. No dilated small bowel. Stable mild gastric hiatal hernia. Decompressed stomach and duodenum. Stable liver, gallbladder, spleen, pancreas and right adrenal gland. Stable kidneys. The left adrenal nodule identified on 03/08/2014 enhances but is stable in size.  Portal venous system is patent. Extensive Aortoiliac calcified atherosclerosis noted. No abdominal free fluid. No free air.  Review of the MIP images confirms the above findings.  IMPRESSION: 1. Positive for acute PE with CT evidence of right heartstrain (RV/LV Ratio = 1.2) consistent with at least submassive (intermediate risk)PE. The presence of right heart strain has been associated with anincreased risk of morbidity and mortality. Consultation with [facility] is recommended. 2. Moderate layering left pleural effusion is new since October. Left lower lobe consolidation which in this setting may be pulmonary infarct. Increase nodular left perihilar opacity. 3. Left hilar peribronchial mass known to be lung cancer persists, and partially obscured by the acute findings today. 4. Indeterminate left adrenal nodule is stable. 5. Small left bladder calculus is new since 2015. No other acute findings in the abdomen or pelvis. Critical Value/emergent results were called by telephone at the time of interpretation on 08/17/2014 at 10:06 am to Dr. Ezequiel Essex , who verbally acknowledged these results.   Electronically Signed   By: Lars Pinks M.D.   On: 08/17/2014 10:07   Dg Chest Portable 1 View  08/17/2014   CLINICAL DATA:  81 year old male with loss of consciousness, head injury. Atrial fibrillation Initial encounter. Current history of lung cancer.   EXAM: PORTABLE CHEST - 1 VIEW  COMPARISON:  08/09/2014 and earlier.  FINDINGS: Portable AP semi upright view at 0658 hrs. Stable right chest porta cath. Stable lung volumes. No pneumothorax. No pulmonary edema. Bibasilar pleural scarring or small effusions, unchanged. Nodular opacity about the left hilum also stable. No areas of worsening ventilation. Stable cardiac size and mediastinal contours.  IMPRESSION: 1. Stable radiographic appearance of the chest. See recommendations on the recent radiographs of 08/09/2014. 2. No new cardiopulmonary abnormality.   Electronically Signed   By: Lars Pinks M.D.   On: 08/17/2014 07:20   Ct Maxillofacial Wo Cm  08/17/2014   CLINICAL DATA:  Syncopal episode with resulting fall and left  forehead/facial injury. Loss of consciousness. Initial encounter.  EXAM: CT HEAD WITHOUT CONTRAST  CT MAXILLOFACIAL WITHOUT CONTRAST  CT CERVICAL SPINE WITHOUT CONTRAST  TECHNIQUE: Multidetector CT imaging of the head, cervical spine, and maxillofacial structures were performed using the standard protocol without intravenous contrast. Multiplanar CT image reconstructions of the cervical spine and maxillofacial structures were also generated.  COMPARISON:  Head CT 03/17/2014.  FINDINGS: CT HEAD FINDINGS  There is no evidence of acute intracranial hemorrhage, mass lesion, brain edema or extra-axial fluid collection. The ventricles and subarachnoid spaces are prominent but stable. There is new ill-defined low-density along the left aspect of the tentorium on images 11 and 12, probably reflecting encephalomalacia. This localizes to the superior aspect of the left cerebellum on the reformatted images from the maxillofacial CT. Otherwise, there are stable chronic small vessel ischemic changes in the periventricular white matter. No acute cortical based infarct identified. Intracranial vascular calcifications noted.  The visualized paranasal sinuses, mastoid air cells and middle ears are clear. Left  frontal scalp soft tissue injury noted. There is no evidence of underlying calvarial fracture.  CT MAXILLOFACIAL FINDINGS  Left supraorbital soft tissue laceration associated with mild pre orbital soft tissue swelling medially in the left orbit. There is no postseptal inflammation. The globes are intact. The extra-ocular muscles and optic nerves are intact. The paranasal sinuses are clear without air-fluid levels.  No evidence of acute facial fracture.  CT CERVICAL SPINE FINDINGS  The cervical alignment is normal. There is no evidence of acute fracture or traumatic subluxation. Paraspinal osteophytes are noted. There is ossification of the ligamentum nuchae.  No acute soft tissue findings are evident. There are diffuse calcifications of the carotid arteries bilaterally. Left pleural effusion and left perihilar airspace disease are partially imaged.  IMPRESSION: 1. Left supraorbital and periorbital soft tissue injury. No evidence of orbital hematoma or maxillofacial fracture. 2. New low-density in the left superior cerebellum, most likely representing encephalomalacia from an interval infarct. This could be subacute. No evidence of acute intracranial hemorrhage. 3. No evidence of acute cervical spine fracture, traumatic subluxation or static signs of instability. 4. Left pleural effusion and left suprahilar airspace disease. Thoracic findings are further described on the concurrent chest CT.   Electronically Signed   By: Camie Patience M.D.   On: 08/17/2014 09:55     EKG Interpretation   Date/Time:  Wednesday August 17 2014 06:54:35 EST Ventricular Rate:  85 PR Interval:  198 QRS Duration: 152 QT Interval:  451 QTC Calculation: 536 R Axis:   -59 Text Interpretation:  Sinus rhythm Probable left atrial enlargement RBBB  and LAFB Lateral infarct, old No significant change was found Confirmed by  Wyvonnia Dusky  MD, Adama Ivins [phone]) on 08/17/2014 7:23:29 AM      MDM   Final diagnoses:  Syncope  Pulmonary  emboli   Syncopal episode with head injury and multiple skin tears. Atrial fibrillation has resolved to sinus rhythm. No history of the same. Patient with history of lung cancer. He is alert and oriented.  EKG sinus rhythm. patient presenting with laceration to left forehead. he is not anticoagulated. multiple skin tears to her arms.  Some hypoxia to the 80s in the ED. Patient does endorse some shortness of breath. He has a history of cancer.  CT head, face and C-spine are negative for acute pathology. There is a questionable subacute infarct or encephalomalacia on head CT.  With his syncope, shortness of breath and hypoxia as well as cancer history, pulmonary embolism  is entertained. CT scan is positive for submassive PE with right heart strain. Blood pressure and mental status are stable. Scalp laceration repaired by Kirichenko PAC. D/w Dr. Migdalia Dk who agrees to evaluate for partial facial nerve injury. Discussed with Dr. Elsworth Soho of critical care who has consulted on patient and feels patient can be admitted by hospitalist on heparin drip. Possible area of subacute infarct discussed with neurology who recommended MRI. Patient will need to be anticoagulated given his submassive pulmonary embolism.  Discussed with Dr. Candiss Norse who will admit patient.  CRITICAL CARE Performed by: Ezequiel Essex Total critical care time: 45 Critical care time was exclusive of separately billable procedures and treating other patients. Critical care was necessary to treat or prevent imminent or life-threatening deterioration. Critical care was time spent personally by me on the following activities: development of treatment plan with patient and/or surrogate as well as nursing, discussions with consultants, evaluation of patient's response to treatment, examination of patient, obtaining history from patient or surrogate, ordering and performing treatments and interventions, ordering and review of laboratory studies,  ordering and review of radiographic studies, pulse oximetry and re-evaluation of patient's condition.       Ezequiel Essex, MD 08/17/14 [place], MD 08/18/14 1000

## 2014-08-17 NOTE — Consult Note (Signed)
Name: Gregory Farrell MRN: [id number] DOB: June 01, 1933    ADMISSION DATE:  08/17/2014 CONSULTATION DATE:  08/17/14  REFERRING MD :  Dr. Wyvonnia Dusky - EDP   CHIEF COMPLAINT:  PE   BRIEF PATIENT DESCRIPTION: 81 y/o M with PMH of squamous cell lung cancer admitted 2/3 after syncopal episode.  Found to have Afib with RVR and CTA positive for PE.  PCCM consulted for evaluation.   SIGNIFICANT EVENTS  2/03  Admit after syncopal episode,   STUDIES:  2/03  CTA Chest >> acute PE with CT evidence of R heart strain c/w submassive PE, mod layering L effusion, LLL consolidation, L hilar peribronchial mass, indeterminate L adrenal node 2/03  CT Cervical Spine >> no evidence of acute fracture or traumatic subluxation  2/03  CT Head >> L supraorbital & periorbital soft tissue injury, no orbital hematoma or fracture, low density in L superior cerebellum concerning for encephalomalacia from interval infarct 2/03  CT Maxillofacial >> no evidence of acute facial fx  HISTORY OF PRESENT ILLNESS:  81 y/o M with a PMH of hypertension, hyperlipidemia, CAD, GERD, renal insufficiency, B12 deficiency, diabetes mellitus, COPD, 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  He was last seen by Dr. Marin Olp on 1/26 with notes reflective of failure to thrive - poor appetite, weight loss etc.    The patient presented to [facility] ER on 2/3 after being found down at home by his wife unresponsive and incontinent.  EMS was activated and the patient reports waking in transport.  The patient suffered a laceration to the forehead, left elbow and left wrist.  He was placed on 15L oxygen after he was noted to have bradycardia with position changes.  He was also noted to have Afib with RVR in route to the ER.  ER work up notable for negative CT of the head,  cervical spine and maxillofacial.  However, CTA of the chest was positive for acute PE with CT evidence of right heart strain.  VSS were stable in ER.  PCCM consulted for evaluation.    PAST MEDICAL HISTORY :   has a past medical history of Hypertension; COPD (chronic obstructive pulmonary disease); GERD (gastroesophageal reflux disease); Renal insufficiency; Hyperlipidemia; CAD (coronary artery disease); B12 deficiency; Memory loss; Vertigo; Diabetes mellitus; Overweight (01/31/2013); and Lung cancer, hilus (03/22/2014).  has past surgical history that includes Coronary angioplasty with stent; Inguinal hernia repair (2010); Video bronchoscopy (Bilateral, 03/07/2014); and Esophagogastroduodenoscopy (N/A, 05/12/2014).   HOME MEDICATIONS: Prior to Admission medications   Medication Sig Start Date End Date Taking? Authorizing Provider  citalopram (CELEXA) 20 MG tablet Take 1 tablet (20 mg total) by mouth daily. Patient taking differently: Take 20 mg by mouth 2 (two) times daily.  05/23/14  Yes Barton Dubois, MD  insulin detemir (LEVEMIR) 100 UNIT/ML injection Inject 5 Units into the skin daily. No insulin in last week, d/t glucose readings.   Yes Historical Provider, MD  NOVOFINE 32G X 6 MM MISC use once daily with levemir 07/12/14  Yes Mosie Lukes, MD  pantoprazole (PROTONIX) 20 MG tablet Take 40 mg by mouth daily.    Yes Historical Provider, MD  tamsulosin (FLOMAX) 0.4 MG CAPS capsule TAKE ONE CAPSULE BY MOUTH ONCE DAILY AFTER SUPPER 08/03/14  Yes Volanda Napoleon, MD  zolpidem (  AMBIEN) 5 MG tablet Take 1 tablet (5 mg total) by mouth at bedtime as needed for sleep. 07/12/14  Yes Volanda Napoleon, MD  albuterol (PROVENTIL HFA;VENTOLIN HFA) 108 (90 BASE) MCG/ACT inhaler Inhale 2 puffs into the lungs every 6 (six) hours as needed for wheezing or shortness of breath (cough). Patient not taking: Reported on 08/17/2014 06/21/14   Mosie Lukes, MD  lactulose (CHRONULAC) 10 GM/15ML solution Take 30 mLs (20 g  total) by mouth 2 (two) times daily as needed for mild constipation or moderate constipation. Patient not taking: Reported on 08/17/2014 05/23/14   Barton Dubois, MD  methylphenidate (RITALIN) 10 MG tablet Take 1 tablet (10 mg total) by mouth 2 (two) times daily. Patient not taking: Reported on 08/17/2014 07/12/14   Volanda Napoleon, MD  pantoprazole sodium (PROTONIX) 40 mg/20 mL PACK Take 20 mLs (40 mg total) by mouth daily. Patient not taking: Reported on 08/17/2014 06/17/14   Volanda Napoleon, MD  zolpidem (AMBIEN) 5 MG tablet TAKE ONE TABLET BY MOUTH ONCE DAILY AT BEDTIME AS NEEDED FOR SLEEP Patient not taking: Reported on 08/17/2014 08/15/14   Volanda Napoleon, MD   Allergies  Allergen Reactions  . Exenatide     REACTION: nausea    FAMILY HISTORY:  family history includes Breast cancer in his mother; Coronary artery disease in his mother; Diabetes in his mother; Leukemia in his brother.   SOCIAL HISTORY:  reports that he quit smoking about 12 years ago. His smoking use included Cigarettes. He started smoking about 31 years ago. He has a 20 pack-year smoking history. He has never used smokeless tobacco. He reports that he does not drink alcohol.  REVIEW OF SYSTEMS:   Constitutional: Negative for fever, chills,  and diaphoresis. Reports weight loss, malaise/fatigue HENT: Negative for hearing loss, ear pain, nosebleeds, congestion, sore throat, neck pain, tinnitus and ear discharge.   Eyes: Negative for blurred vision, double vision, photophobia, pain, discharge and redness.  Respiratory: Negative for cough, hemoptysis, sputum production, wheezing and stridor.  Positive for SOB, mild chest pressure.  Cardiovascular: Negative for orthopnea, claudication, leg swelling and PND. Positive for palpitations, syncope.  Gastrointestinal: Negative for heartburn, nausea, vomiting, abdominal pain, diarrhea, constipation, blood in stool and melena. Reports decreased appetite.   Genitourinary: Negative for dysuria,  urgency, frequency, hematuria and flank pain.  Musculoskeletal: Negative for myalgias, back pain, joint pain and falls.  Skin: Negative for itching and rash.  Neurological: Negative for dizziness, tingling, tremors, sensory change, speech change, focal weakness, seizures, loss of consciousness, weakness and headaches.  Endo/Heme/Allergies: Negative for environmental allergies and polydipsia. Does not bruise/bleed easily.  SUBJECTIVE:   VITAL SIGNS: Temp:  [97.5 F (36.4 C)] 97.5 F (36.4 C) (02/03 0656) Pulse Rate:  [79-88] 80 (02/03 0815) Resp:  [19-28] 23 (02/03 0815) BP: (98-113)/(53-74) 107/59 mmHg (02/03 0815) SpO2:  [87 %-100 %] 94 % (02/03 0815)  PHYSICAL EXAMINATION: General:  Frail elderly male in NAD Neuro:  Nonfocal, awake, lethargic HEENT:  Pupils 3 mm reactive to light, laceration above left eyebrow Cardiovascular:  S1 and S2 tachycardia  Lungs:  clear lungs Abdomen:  Soft and nontender Musculoskeletal:  Good pulses Skin:  No rash   Recent Labs Lab 08/17/14 0706 08/17/14 0752  NA 135 138  K 3.2* 3.3*  CL 103 103  CO2 23  --   BUN 9 13  CREATININE 1.48* 1.30  GLUCOSE 208* 206*    Recent Labs Lab 08/17/14 0706 08/17/14 0752  HGB  13.3 18.0*  HCT 40.8 53.0*  WBC 10.4  --   PLT PLATELET CLUMPS NOTED ON SMEAR, UNABLE TO ESTIMATE  --    Ct Head Wo Contrast  08/17/2014   CLINICAL DATA:  Syncopal episode with resulting fall and left forehead/facial injury. Loss of consciousness. Initial encounter.  EXAM: CT HEAD WITHOUT CONTRAST  CT MAXILLOFACIAL WITHOUT CONTRAST  CT CERVICAL SPINE WITHOUT CONTRAST  TECHNIQUE: Multidetector CT imaging of the head, cervical spine, and maxillofacial structures were performed using the standard protocol without intravenous contrast. Multiplanar CT image reconstructions of the cervical spine and maxillofacial structures were also generated.  COMPARISON:  Head CT 03/17/2014.  FINDINGS: CT HEAD FINDINGS  There is no evidence of acute  intracranial hemorrhage, mass lesion, brain edema or extra-axial fluid collection. The ventricles and subarachnoid spaces are prominent but stable. There is new ill-defined low-density along the left aspect of the tentorium on images 11 and 12, probably reflecting encephalomalacia. This localizes to the superior aspect of the left cerebellum on the reformatted images from the maxillofacial CT. Otherwise, there are stable chronic small vessel ischemic changes in the periventricular white matter. No acute cortical based infarct identified. Intracranial vascular calcifications noted.  The visualized paranasal sinuses, mastoid air cells and middle ears are clear. Left frontal scalp soft tissue injury noted. There is no evidence of underlying calvarial fracture.  CT MAXILLOFACIAL FINDINGS  Left supraorbital soft tissue laceration associated with mild pre orbital soft tissue swelling medially in the left orbit. There is no postseptal inflammation. The globes are intact. The extra-ocular muscles and optic nerves are intact. The paranasal sinuses are clear without air-fluid levels.  No evidence of acute facial fracture.  CT CERVICAL SPINE FINDINGS  The cervical alignment is normal. There is no evidence of acute fracture or traumatic subluxation. Paraspinal osteophytes are noted. There is ossification of the ligamentum nuchae.  No acute soft tissue findings are evident. There are diffuse calcifications of the carotid arteries bilaterally. Left pleural effusion and left perihilar airspace disease are partially imaged.  IMPRESSION: 1. Left supraorbital and periorbital soft tissue injury. No evidence of orbital hematoma or maxillofacial fracture. 2. New low-density in the left superior cerebellum, most likely representing encephalomalacia from an interval infarct. This could be subacute. No evidence of acute intracranial hemorrhage. 3. No evidence of acute cervical spine fracture, traumatic subluxation or static signs of  instability. 4. Left pleural effusion and left suprahilar airspace disease. Thoracic findings are further described on the concurrent chest CT.   Electronically Signed   By: Camie Patience M.D.   On: 08/17/2014 09:55   Ct Angio Chest Pe W/cm &/or Wo Cm  08/17/2014   CLINICAL DATA:  81 year old male with syncopal episode, found down with loss of consciousness. Initial encounter. Current history of lung cancer.  EXAM: CT ANGIOGRAPHY CHEST  CT ABDOMEN AND PELVIS WITH CONTRAST  TECHNIQUE: Multidetector CT imaging of the chest was performed using the standard protocol during bolus administration of intravenous contrast. Multiplanar CT image reconstructions and MIPs were obtained to evaluate the vascular anatomy. Multidetector CT imaging of the abdomen and pelvis was performed using the standard protocol during bolus administration of intravenous contrast.  CONTRAST:  124mL OMNIPAQUE IOHEXOL 350 MG/ML SOLN  COMPARISON:  Portable chest radiograph 0658 hr today. Chest CTA 05/07/2014. PET-CT 03/08/2014.  FINDINGS: CTA CHEST FINDINGS  Good contrast bolus timing in the pulmonary arterial tree. Positive bilateral pulmonary artery filling defects. Confluent right upper lobe pulmonary embolus. Confluent left lower lobe pulmonary embolus. Right  greater than left main pulmonary artery clot. No saddle embolus.  RV / LV ratio = 1.2  Moderate layering left pleural effusion. No pericardial effusion. Trace layering right pleural effusion.  Atelectatic changes to some of the major airways. Compressive atelectasis throughout the left lung. Continued abnormal soft tissue at the left hilum, but now partially obscured by the left hilar pulmonary thrombus. Left lower lobe consolidation with air bronchograms is increased. Nodular left lingula peribronchovascular opacity is new since October.  Stable right lung markings since October.  Right chest porta cath. Negative thoracic inlet. Aorta and coronary arteries with extensive calcified plaque.   Stable osseous structures in the thorax.  CT ABDOMEN and PELVIS FINDINGS  No acute or suspicious osseous abnormality. Advanced degenerative changes at the right hip.  No pelvic free fluid. Diminutive bladder with layering density posteriorly on the left series 5, image 83. Stool in the rectum and sigmoid colon. Redundant sigmoid. Decompressed left colon, transverse colon and right colon. Diverticulosis of the right colon is stable. No active inflammation. No dilated small bowel. Stable mild gastric hiatal hernia. Decompressed stomach and duodenum. Stable liver, gallbladder, spleen, pancreas and right adrenal gland. Stable kidneys. The left adrenal nodule identified on 03/08/2014 enhances but is stable in size.  Portal venous system is patent. Extensive Aortoiliac calcified atherosclerosis noted. No abdominal free fluid. No free air.  Review of the MIP images confirms the above findings.  IMPRESSION: 1. Positive for acute PE with CT evidence of right heartstrain (RV/LV Ratio = 1.2) consistent with at least submassive (intermediate risk)PE. The presence of right heart strain has been associated with anincreased risk of morbidity and mortality. Consultation with Daniel is recommended. 2. Moderate layering left pleural effusion is new since October. Left lower lobe consolidation which in this setting may be pulmonary infarct. Increase nodular left perihilar opacity. 3. Left hilar peribronchial mass known to be lung cancer persists, and partially obscured by the acute findings today. 4. Indeterminate left adrenal nodule is stable. 5. Small left bladder calculus is new since 2015. No other acute findings in the abdomen or pelvis. Critical Value/emergent results were called by telephone at the time of interpretation on 08/17/2014 at 10:06 am to Dr. Ezequiel Essex , who verbally acknowledged these results.   Electronically Signed   By: Lars Pinks M.D.   On: 08/17/2014 10:07   Ct Cervical Spine  Wo Contrast  08/17/2014   CLINICAL DATA:  Syncopal episode with resulting fall and left forehead/facial injury. Loss of consciousness. Initial encounter.  EXAM: CT HEAD WITHOUT CONTRAST  CT MAXILLOFACIAL WITHOUT CONTRAST  CT CERVICAL SPINE WITHOUT CONTRAST  TECHNIQUE: Multidetector CT imaging of the head, cervical spine, and maxillofacial structures were performed using the standard protocol without intravenous contrast. Multiplanar CT image reconstructions of the cervical spine and maxillofacial structures were also generated.  COMPARISON:  Head CT 03/17/2014.  FINDINGS: CT HEAD FINDINGS  There is no evidence of acute intracranial hemorrhage, mass lesion, brain edema or extra-axial fluid collection. The ventricles and subarachnoid spaces are prominent but stable. There is new ill-defined low-density along the left aspect of the tentorium on images 11 and 12, probably reflecting encephalomalacia. This localizes to the superior aspect of the left cerebellum on the reformatted images from the maxillofacial CT. Otherwise, there are stable chronic small vessel ischemic changes in the periventricular white matter. No acute cortical based infarct identified. Intracranial vascular calcifications noted.  The visualized paranasal sinuses, mastoid air cells and middle ears are clear. Left  frontal scalp soft tissue injury noted. There is no evidence of underlying calvarial fracture.  CT MAXILLOFACIAL FINDINGS  Left supraorbital soft tissue laceration associated with mild pre orbital soft tissue swelling medially in the left orbit. There is no postseptal inflammation. The globes are intact. The extra-ocular muscles and optic nerves are intact. The paranasal sinuses are clear without air-fluid levels.  No evidence of acute facial fracture.  CT CERVICAL SPINE FINDINGS  The cervical alignment is normal. There is no evidence of acute fracture or traumatic subluxation. Paraspinal osteophytes are noted. There is ossification of the  ligamentum nuchae.  No acute soft tissue findings are evident. There are diffuse calcifications of the carotid arteries bilaterally. Left pleural effusion and left perihilar airspace disease are partially imaged.  IMPRESSION: 1. Left supraorbital and periorbital soft tissue injury. No evidence of orbital hematoma or maxillofacial fracture. 2. New low-density in the left superior cerebellum, most likely representing encephalomalacia from an interval infarct. This could be subacute. No evidence of acute intracranial hemorrhage. 3. No evidence of acute cervical spine fracture, traumatic subluxation or static signs of instability. 4. Left pleural effusion and left suprahilar airspace disease. Thoracic findings are further described on the concurrent chest CT.   Electronically Signed   By: Camie Patience M.D.   On: 08/17/2014 09:55   Ct Abdomen Pelvis W Contrast  08/17/2014   CLINICAL DATA:  81 year old male with syncopal episode, found down with loss of consciousness. Initial encounter. Current history of lung cancer.  EXAM: CT ANGIOGRAPHY CHEST  CT ABDOMEN AND PELVIS WITH CONTRAST  TECHNIQUE: Multidetector CT imaging of the chest was performed using the standard protocol during bolus administration of intravenous contrast. Multiplanar CT image reconstructions and MIPs were obtained to evaluate the vascular anatomy. Multidetector CT imaging of the abdomen and pelvis was performed using the standard protocol during bolus administration of intravenous contrast.  CONTRAST:  175mL OMNIPAQUE IOHEXOL 350 MG/ML SOLN  COMPARISON:  Portable chest radiograph 0658 hr today. Chest CTA 05/07/2014. PET-CT 03/08/2014.  FINDINGS: CTA CHEST FINDINGS  Good contrast bolus timing in the pulmonary arterial tree. Positive bilateral pulmonary artery filling defects. Confluent right upper lobe pulmonary embolus. Confluent left lower lobe pulmonary embolus. Right greater than left main pulmonary artery clot. No saddle embolus.  RV / LV ratio =  1.2  Moderate layering left pleural effusion. No pericardial effusion. Trace layering right pleural effusion.  Atelectatic changes to some of the major airways. Compressive atelectasis throughout the left lung. Continued abnormal soft tissue at the left hilum, but now partially obscured by the left hilar pulmonary thrombus. Left lower lobe consolidation with air bronchograms is increased. Nodular left lingula peribronchovascular opacity is new since October.  Stable right lung markings since October.  Right chest porta cath. Negative thoracic inlet. Aorta and coronary arteries with extensive calcified plaque.  Stable osseous structures in the thorax.  CT ABDOMEN and PELVIS FINDINGS  No acute or suspicious osseous abnormality. Advanced degenerative changes at the right hip.  No pelvic free fluid. Diminutive bladder with layering density posteriorly on the left series 5, image 83. Stool in the rectum and sigmoid colon. Redundant sigmoid. Decompressed left colon, transverse colon and right colon. Diverticulosis of the right colon is stable. No active inflammation. No dilated small bowel. Stable mild gastric hiatal hernia. Decompressed stomach and duodenum. Stable liver, gallbladder, spleen, pancreas and right adrenal gland. Stable kidneys. The left adrenal nodule identified on 03/08/2014 enhances but is stable in size.  Portal venous system is patent. Extensive  Aortoiliac calcified atherosclerosis noted. No abdominal free fluid. No free air.  Review of the MIP images confirms the above findings.  IMPRESSION: 1. Positive for acute PE with CT evidence of right heartstrain (RV/LV Ratio = 1.2) consistent with at least submassive (intermediate risk)PE. The presence of right heart strain has been associated with anincreased risk of morbidity and mortality. Consultation with [facility] is recommended. 2. Moderate layering left pleural effusion is new since October. Left lower lobe consolidation which  in this setting may be pulmonary infarct. Increase nodular left perihilar opacity. 3. Left hilar peribronchial mass known to be lung cancer persists, and partially obscured by the acute findings today. 4. Indeterminate left adrenal nodule is stable. 5. Small left bladder calculus is new since 2015. No other acute findings in the abdomen or pelvis. Critical Value/emergent results were called by telephone at the time of interpretation on 08/17/2014 at 10:06 am to Dr. Ezequiel Essex , who verbally acknowledged these results.   Electronically Signed   By: Lars Pinks M.D.   On: 08/17/2014 10:07   Dg Chest Portable 1 View  08/17/2014   CLINICAL DATA:  81 year old male with loss of consciousness, head injury. Atrial fibrillation Initial encounter. Current history of lung cancer.  EXAM: PORTABLE CHEST - 1 VIEW  COMPARISON:  08/09/2014 and earlier.  FINDINGS: Portable AP semi upright view at 0658 hrs. Stable right chest porta cath. Stable lung volumes. No pneumothorax. No pulmonary edema. Bibasilar pleural scarring or small effusions, unchanged. Nodular opacity about the left hilum also stable. No areas of worsening ventilation. Stable cardiac size and mediastinal contours.  IMPRESSION: 1. Stable radiographic appearance of the chest. See recommendations on the recent radiographs of 08/09/2014. 2. No new cardiopulmonary abnormality.   Electronically Signed   By: Lars Pinks M.D.   On: 08/17/2014 07:20   Ct Maxillofacial Wo Cm  08/17/2014   CLINICAL DATA:  Syncopal episode with resulting fall and left forehead/facial injury. Loss of consciousness. Initial encounter.  EXAM: CT HEAD WITHOUT CONTRAST  CT MAXILLOFACIAL WITHOUT CONTRAST  CT CERVICAL SPINE WITHOUT CONTRAST  TECHNIQUE: Multidetector CT imaging of the head, cervical spine, and maxillofacial structures were performed using the standard protocol without intravenous contrast. Multiplanar CT image reconstructions of the cervical spine and maxillofacial structures were  also generated.  COMPARISON:  Head CT 03/17/2014.  FINDINGS: CT HEAD FINDINGS  There is no evidence of acute intracranial hemorrhage, mass lesion, brain edema or extra-axial fluid collection. The ventricles and subarachnoid spaces are prominent but stable. There is new ill-defined low-density along the left aspect of the tentorium on images 11 and 12, probably reflecting encephalomalacia. This localizes to the superior aspect of the left cerebellum on the reformatted images from the maxillofacial CT. Otherwise, there are stable chronic small vessel ischemic changes in the periventricular white matter. No acute cortical based infarct identified. Intracranial vascular calcifications noted.  The visualized paranasal sinuses, mastoid air cells and middle ears are clear. Left frontal scalp soft tissue injury noted. There is no evidence of underlying calvarial fracture.  CT MAXILLOFACIAL FINDINGS  Left supraorbital soft tissue laceration associated with mild pre orbital soft tissue swelling medially in the left orbit. There is no postseptal inflammation. The globes are intact. The extra-ocular muscles and optic nerves are intact. The paranasal sinuses are clear without air-fluid levels.  No evidence of acute facial fracture.  CT CERVICAL SPINE FINDINGS  The cervical alignment is normal. There is no evidence of acute fracture or traumatic subluxation. Paraspinal  osteophytes are noted. There is ossification of the ligamentum nuchae.  No acute soft tissue findings are evident. There are diffuse calcifications of the carotid arteries bilaterally. Left pleural effusion and left perihilar airspace disease are partially imaged.  IMPRESSION: 1. Left supraorbital and periorbital soft tissue injury. No evidence of orbital hematoma or maxillofacial fracture. 2. New low-density in the left superior cerebellum, most likely representing encephalomalacia from an interval infarct. This could be subacute. No evidence of acute intracranial  hemorrhage. 3. No evidence of acute cervical spine fracture, traumatic subluxation or static signs of instability. 4. Left pleural effusion and left suprahilar airspace disease. Thoracic findings are further described on the concurrent chest CT.   Electronically Signed   By: Camie Patience M.D.   On: 08/17/2014 09:55    ASSESSMENT / PLAN:  Acute Pulmonary Embolism - provoked in the setting of known malignancy  Hx DVT L Squamous Cell Lung Cancer - last therapy ~ 4 mo ago (04/2014).  Followed by Dr. Marin Olp.   Plan: Heparin gtt per pharmacy - can switch to Lovenox if he stays stable in 24 hours Cycle troponin  Doubt need for ECHO  DNR/DNI Not a candidate for thrombolytics at this time, hemodynamically stable  Discuss NOAC vs Coumadin in am    Atrial Fibrillation - PAF, now SR  Plan: Per primary SVC  L Supraorbital Laceration  Multiple Skin Tears  Plan: Sutures placed in ER Further care per [facility]  Failure to Thrive   Plan: Consider [place], both patient and his daughter are agreeable to hospice care eventually. Will need to inform Dr. Marin Olp accordingly  We will let the hospitalist team admit. Please call for questions  Care during the described time interval was provided by me and/or other providers on the critical care team.  I have reviewed this patient's available data, including medical history, events of note, physical examination and test results as part of my evaluation  Kara Mead MD. FCCP. [facility] Pager [phone] If no response call [phone]     08/17/2014, 10:35 AM

## 2014-08-17 NOTE — ED Notes (Signed)
Pt's given gingerale.

## 2014-08-17 NOTE — ED Notes (Signed)
Suture cart at bedside. Pt still in CT. Family at bedside. RN updated family on pt's condition

## 2014-08-17 NOTE — ED Notes (Signed)
Pt in CT.

## 2014-08-17 NOTE — ED Notes (Signed)
Redressed right arm skin tear.

## 2014-08-17 NOTE — ED Notes (Signed)
Duplicate orders discontinued.

## 2014-08-17 NOTE — Plan of Care (Signed)
Problem: Consults Goal: Diagnosis - Venous Thromboembolism (VTE) Choose a selection Outcome: Completed/Met Date Met:  08/17/14 PE (Pulmonary Embolism)

## 2014-08-18 ENCOUNTER — Inpatient Hospital Stay (HOSPITAL_COMMUNITY): Payer: Medicare HMO

## 2014-08-18 ENCOUNTER — Encounter (HOSPITAL_COMMUNITY): Payer: Self-pay | Admitting: Plastic Surgery

## 2014-08-18 DIAGNOSIS — J948 Other specified pleural conditions: Secondary | ICD-10-CM

## 2014-08-18 DIAGNOSIS — J90 Pleural effusion, not elsewhere classified: Secondary | ICD-10-CM | POA: Insufficient documentation

## 2014-08-18 DIAGNOSIS — J9601 Acute respiratory failure with hypoxia: Secondary | ICD-10-CM

## 2014-08-18 DIAGNOSIS — C3490 Malignant neoplasm of unspecified part of unspecified bronchus or lung: Secondary | ICD-10-CM

## 2014-08-18 DIAGNOSIS — I2699 Other pulmonary embolism without acute cor pulmonale: Secondary | ICD-10-CM

## 2014-08-18 LAB — BASIC METABOLIC PANEL
ANION GAP: 6 (ref 5–15)
BUN: 10 mg/dL (ref 6–23)
CO2: 24 mmol/L (ref 19–32)
CREATININE: 1.46 mg/dL — AB (ref 0.50–1.35)
Calcium: 8.4 mg/dL (ref 8.4–10.5)
Chloride: 105 mmol/L (ref 96–112)
GFR calc Af Amer: 51 mL/min — ABNORMAL LOW (ref >90)
GFR calc non Af Amer: 44 mL/min — ABNORMAL LOW (ref >90)
Glucose, Bld: 155 mg/dL — ABNORMAL HIGH (ref 70–99)
Potassium: 5.3 mmol/L — ABNORMAL HIGH (ref 3.5–5.1)
Sodium: 135 mmol/L (ref 135–145)

## 2014-08-18 LAB — GLUCOSE, CAPILLARY
GLUCOSE-CAPILLARY: 154 mg/dL — AB (ref 70–99)
GLUCOSE-CAPILLARY: 221 mg/dL — AB (ref 70–99)
Glucose-Capillary: 163 mg/dL — ABNORMAL HIGH (ref 70–99)
Glucose-Capillary: 117 mg/dL — ABNORMAL HIGH (ref 70–99)

## 2014-08-18 LAB — CBC
HCT: 40.7 % (ref 39.0–52.0)
Hemoglobin: 12.8 g/dL — ABNORMAL LOW (ref 13.0–17.0)
MCH: 30.3 pg (ref 26.0–34.0)
MCHC: 31.4 g/dL (ref 30.0–36.0)
MCV: 96.2 fL (ref 78.0–100.0)
Platelets: DECREASED 10*3/uL (ref 150–400)
RBC: 4.23 MIL/uL (ref 4.22–5.81)
RDW: 15 % (ref 11.5–15.5)
WBC: 15.8 10*3/uL — AB (ref 4.0–10.5)

## 2014-08-18 LAB — PREALBUMIN: Prealbumin: 8.6 mg/dL — ABNORMAL LOW (ref 17.0–34.0)

## 2014-08-18 LAB — HEPARIN LEVEL (UNFRACTIONATED)
HEPARIN UNFRACTIONATED: 0.65 [IU]/mL (ref 0.30–0.70)
Heparin Unfractionated: 0.88 IU/mL — ABNORMAL HIGH (ref 0.30–0.70)

## 2014-08-18 LAB — APTT: APTT: 104 s — AB (ref 24–37)

## 2014-08-18 MED ORDER — LIDOCAINE HCL (PF) 1 % IJ SOLN
INTRAMUSCULAR | Status: AC
  Filled 2014-08-18: qty 10

## 2014-08-18 NOTE — Progress Notes (Signed)
Nutrition Brief Note  Chart reviewed. Plans for patient is to focus on comfort care with transfer to [facility] early next week.  Patient is receiving Ensure Complete BID along with a heart healthy diet. No further nutrition interventions warranted at this time.  Please consult as needed.    Molli Barrows, RD, LDN, [place] Pager [phone] After Hours Pager [phone]

## 2014-08-18 NOTE — Progress Notes (Signed)
PT Cancellation Note  Patient Details Name: Gregory Farrell MRN: [id number] DOB: Nov 25, 1932   Cancelled Treatment:    Reason Eval/Treat Not Completed: Fatigue/lethargy limiting ability to participate. Pt declining eval, stating he is just too weak to get out of the bed.  Per oncology note, pt/family has decided on [name] for hospice care upon d/c. Focus at this time is on comfort.  PT signing off.   Lorriane Shire 08/18/2014, 9:34 AM

## 2014-08-18 NOTE — Progress Notes (Signed)
VASCULAR LAB PRELIMINARY  PRELIMINARY  PRELIMINARY  PRELIMINARY  Bilateral lower extremity venous duplex  completed.    Preliminary report:  Bilateral:  No evidence of DVT, superficial thrombosis, or Baker's Cyst.    Ziair Penson, RVT 08/18/2014, 2:58 PM

## 2014-08-18 NOTE — Consult Note (Signed)
Referral MD  Reason for Referral: Pulmonary embolism and lung cancer   Chief Complaint  Patient presents with  . Loss of Consciousness  . Atrial Fibrillation  : I keep falling at home.  HPI: Gregory Farrell is well-known to me. He is a 81 year old gentleman. He presented initially with locally advanced non-small cell lung cancer. This was in 2015. He received radiation and weekly low-dose chemotherapy. He had an incredibly hard time with this. He had a good response but he has subsequently recurred.  He did have a thrombus in the right arm back in the fall of last year. We tried him on anticoagulation but he had problems with bleeding.  He's been at home. He's been declining. He has a very poor performance status (ECOG 4). He is not able to do any physical therapy. He's not eating. He has no appetite.  Apparently, he fell at home. He sustained a laceration above the left eye. He has sutures in this laceration.  He was found have a large hilar pulmonary embolism on the left. He had a moderate left pleural effusion. There is left lower lobe consolidation area and has a left hilar peribronchial mass.  He currently is on heparin.  Again, he is very weak. He has no appetite. He denies any, pain. He has some shortness of breath. He is on supplemental oxygen.  We were kindly asked to see him to help with management issues.            Past Medical History  Diagnosis Date  . Hypertension   . COPD (chronic obstructive pulmonary disease)   . GERD (gastroesophageal reflux disease)   . Renal insufficiency   . Hyperlipidemia   . CAD (coronary artery disease)   . B12 deficiency   . Memory loss     "a little" (08/17/2014)  . Vertigo   . Overweight 01/31/2013  . DVT (deep venous thrombosis) 04/2014  . PE (pulmonary embolism) 08/17/2014  . Type II diabetes mellitus     type II, poly neuropathy  . History of blood transfusion 04/2014    "perk him up"  . Lung cancer, hilus 03/22/2014   :  Past Surgical History  Procedure Laterality Date  . Video bronchoscopy Bilateral 03/07/2014    Procedure: VIDEO BRONCHOSCOPY WITHOUT FLUORO;  Surgeon: Kathee Delton, MD;  Location: [facility] ENDOSCOPY;  Service: Cardiopulmonary;  Laterality: Bilateral;  . Esophagogastroduodenoscopy N/A 05/12/2014    Procedure: ESOPHAGOGASTRODUODENOSCOPY (EGD);  Surgeon: Arta Silence, MD;  Location: [facility];  Service: Endoscopy;  Laterality: N/A;  . Inguinal hernia repair Right 2010  . Coronary angioplasty with stent placement  "early 2000's"    "2"  . Cataract extraction w/ intraocular lens  implant, bilateral Bilateral   :   Current facility-administered medications:  .  alum & mag hydroxide-simeth (MAALOX/MYLANTA) 200-200-20 MG/5ML suspension 30 mL, 30 mL, Oral, Q6H PRN, Thurnell Lose, MD .  citalopram (CELEXA) tablet 20 mg, 20 mg, Oral, BID, Thurnell Lose, MD, 20 mg at 08/17/14 1747 .  feeding supplement (ENSURE COMPLETE) (ENSURE COMPLETE) liquid 237 mL, 237 mL, Oral, BID BM, Thurnell Lose, MD .  guaiFENesin-dextromethorphan (ROBITUSSIN DM) 100-10 MG/5ML syrup 5 mL, 5 mL, Oral, Q4H PRN, Thurnell Lose, MD .  heparin ADULT infusion 100 units/mL (25000 units/250 mL), 1,150 Units/hr, Intravenous, Continuous, Sindy Guadeloupe, [facility], Last Rate: 11.5 mL/hr at 08/17/14 2203, 1,150 Units/hr at 08/17/14 2203 .  HYDROcodone-acetaminophen (NORCO/VICODIN) 5-325 MG per tablet 1-2 tablet, 1-2 tablet, Oral, Q4H  PRN, Thurnell Lose, MD, 2 tablet at 08/17/14 1316 .  insulin aspart (novoLOG) injection 0-5 Units, 0-5 Units, Subcutaneous, QHS, Thurnell Lose, MD, 0 Units at 08/17/14 2200 .  insulin aspart (novoLOG) injection 0-9 Units, 0-9 Units, Subcutaneous, TID WC, Thurnell Lose, MD, 1 Units at 08/17/14 1727 .  insulin detemir (LEVEMIR) injection 5 Units, 5 Units, Subcutaneous, Daily, Thurnell Lose, MD, 5 Units at 08/17/14 1748 .  ondansetron (ZOFRAN) tablet 4 mg, 4 mg, Oral, Q6H PRN **OR**  ondansetron (ZOFRAN) injection 4 mg, 4 mg, Intravenous, Q6H PRN, Thurnell Lose, MD .  polyethylene glycol (MIRALAX / GLYCOLAX) packet 17 g, 17 g, Oral, Daily PRN, Thurnell Lose, MD .  sodium chloride 0.9 % injection 10-40 mL, 10-40 mL, Intracatheter, PRN, Thurnell Lose, MD, 10 mL at 08/17/14 1804 .  sodium chloride 0.9 % injection 3 mL, 3 mL, Intravenous, Q12H, Thurnell Lose, MD, 3 mL at 08/17/14 1759 .  tamsulosin (FLOMAX) capsule 0.4 mg, 0.4 mg, Oral, Daily, Thurnell Lose, MD, 0.4 mg at 08/17/14 1748 .  zolpidem (AMBIEN) tablet 5 mg, 5 mg, Oral, QHS PRN, Rhetta Mura Schorr, NP, 5 mg at 08/17/14 2203:  . citalopram  20 mg Oral BID  . feeding supplement (ENSURE COMPLETE)  237 mL Oral BID BM  . insulin aspart  0-5 Units Subcutaneous QHS  . insulin aspart  0-9 Units Subcutaneous TID WC  . insulin detemir  5 Units Subcutaneous Daily  . sodium chloride  3 mL Intravenous Q12H  . tamsulosin  0.4 mg Oral Daily  :  Allergies  Allergen Reactions  . Exenatide     REACTION: nausea  :  Family History  Problem Relation Age of Onset  . Coronary artery disease Mother   . Breast cancer Mother   . Diabetes Mother   . Leukemia Brother   :  History   Social History  . Marital Status: Married    Spouse Name: N/A    Number of Children: 1  . Years of Education: N/A   Occupational History  . Retired     [employer]   Social History Main Topics  . Smoking status: Former Smoker -- 1.00 packs/day for 20 years    Types: Cigarettes    Start date: 09/09/1982    Quit date: 07/15/2002  . Smokeless tobacco: Never Used  . Alcohol Use: No  . Drug Use: No  . Sexual Activity: Not on file   Other Topics Concern  . Not on file   Social History Narrative  :  Pertinent items are noted in HPI.  Exam: Patient Vitals for the past 24 hrs:  BP Temp Temp src Pulse Resp SpO2 Height Weight  08/18/14 0544 (!) 151/85 mmHg 98.9 F (37.2 C) Oral 94 20 95 % - -  08/17/14 2123 138/75 mmHg  97.9 F (36.6 C) Oral 76 18 97 % - -  08/17/14 1503 124/69 mmHg 98.2 F (36.8 C) Oral 75 18 98 % 5\' 8"  (1.727 m) 166 lb (75.297 kg)  08/17/14 1430 118/65 mmHg - - 77 18 94 % - -  08/17/14 1415 117/68 mmHg - - 76 19 93 % - -  08/17/14 1400 118/69 mmHg - - 77 16 96 % - -  08/17/14 1330 127/84 mmHg - - 78 24 96 % - -  08/17/14 1315 122/83 mmHg - - 75 25 96 % - -  08/17/14 1300 146/77 mmHg - - 74 15 94 % - -  08/17/14 1230 129/73 mmHg - - 71 21 92 % - -  08/17/14 1215 129/75 mmHg - - 74 24 93 % - -  08/17/14 1200 126/71 mmHg - - 72 (!) 34 92 % - -  08/17/14 1130 123/70 mmHg - - 69 21 94 % - -  08/17/14 1115 131/77 mmHg - - 71 23 95 % - -  08/17/14 1100 120/69 mmHg - - 72 25 94 % - -  08/17/14 1045 130/75 mmHg - - 72 23 97 % - -  08/17/14 1030 115/73 mmHg - - 74 21 93 % - -  08/17/14 1015 116/67 mmHg - - 73 21 93 % - -  08/17/14 1000 114/68 mmHg - - 75 23 (!) 89 % - -  08/17/14 0945 113/66 mmHg - - 73 22 94 % - -  08/17/14 0930 113/60 mmHg - - 72 19 95 % - -  08/17/14 0815 107/59 mmHg - - 80 23 94 % - -  08/17/14 0800 104/58 mmHg - - 79 22 99 % - -  08/17/14 0730 113/74 mmHg - - 88 (!) 28 (!) 87 % - -  08/17/14 0715 (!) 98/53 mmHg - - 81 19 96 % - -  08/17/14 0700 98/62 mmHg - - 82 (!) 27 94 % - -  08/17/14 0657 102/62 mmHg - - - - 96 % - -  08/17/14 0656 - 97.5 F (36.4 C) Oral - - - - -    Elderly white gentleman in no obvious distress. He has ecchymoses on the left orbital area. Has a laceration above the left eye. His vital signs are temperature 98.9. Pulse 94. Blood pressure 151/85. Oxygen saturation is 95%. Head and neck exam shows the ecchymoses and lacerations as previously stated. Has no oral lesion. There is no adenopathy in the neck. Lungs are with decreased breath sounds on the left side. He has some wheezes bilaterally. Cardiac exam regular rate and rhythm with no murmurs, rubs or Vashti Hey. Abdomen soft. Has good bowel sounds. There is no fluid wave. There is no palpable liver or  spleen tip. Extremities shows some chronic nonpitting edema in his lower legs. He may have some slightly increased edema in the right leg. Skin exam that shows the scattered ecchymoses.    Recent Labs  08/17/14 0706 08/17/14 0752 08/18/14 0518  WBC 10.4  --  15.8*  HGB 13.3 18.0* 12.8*  HCT 40.8 53.0* 40.7  PLT PLATELET CLUMPS NOTED ON SMEAR, UNABLE TO ESTIMATE  --  PENDING    Recent Labs  08/17/14 0706 08/17/14 0752 08/18/14 0518  NA 135 138 135  K 3.2* 3.3* 5.3*  CL 103 103 105  CO2 23  --  24  GLUCOSE 208* 206* 155*  BUN 9 13 10   CREATININE 1.48* 1.30 1.46*  CALCIUM 7.7*  --  8.4    Blood smear review: None  Pathology: None     Assessment and Plan: Gregory Farrell is a 81 year old gentleman. He has a large pulmonary embolism. He is clearly hypercoagulable. He has recurrent non-small cell lung cancer.  He has a very poor performance status.  At this point, I really think our focus needs to be on his comfort. He just is not able to do anything. He does not want to live like this. I can understand this.  I would probably would check his extremities for blood clots. I think he does have clots in his leg, then I would favor putting a filter  in him.  He really is not a good candidate for long-term anticoagulation. We tried him on anticoagulation previously and he had issues with bleeding.  I suspect that his prognosis is easily less than 1 month. As such, I would favor getting him over to [facility] once he is medically ready. I think this would really be best for him.  I would also consider doing a left thoracentesis. The site has fluid on that side. I would try to get that fluid removed. I think this might help with his breathing.  He understands very well that his prognosis is very poor. I spoke to his daughter yesterday. She understands well that his prognosis is not good. She also agrees with [employer].  I very much appreciate all the great care that he is getting  over at [place] 4:6-7

## 2014-08-18 NOTE — Progress Notes (Signed)
Utilization review completed.  

## 2014-08-18 NOTE — Procedures (Signed)
Successful US guided left thoracentesis. Yielded 721mL of clear, amber colored fluid. Pt tolerated procedure well. No immediate complications.  Specimen was sent for labs. CXR ordered.  Ascencion Dike PA-C 08/18/2014 11:36 AM

## 2014-08-18 NOTE — Progress Notes (Addendum)
PROGRESS NOTE  DAVINCI GLOTFELTY RAQ:[id number] DOB: Nov 26, 1932 DOA: 08/17/2014 PCP: [name], MD  Assessment/Plan: Syncope -Secondary to his submassive PE with right heart strain -Continue heparin drip per Dr. Marin Olp -venous duplex of LE as per Dr. Marin Olp -Cancel Echo as daughter wishes to transition to more comfort care approach and results will not change plan  Submassive PE -Continue heparin drip per Dr. Marin Olp -Cancel Echo as daughter wishes to transition to more comfort care approach and results will not change plan  -[place] legs--neg DVT NonSmall Cell Lung Cancer -difficulty tolerating low dose chemo -now with recurrence pleural effusion  -Likely malignant effusion in the setting of recurrent lung cancer  -s/p thoracocentesis 08/18/14--750cc removed -Supplementary oxygen as needed  Acute Respiratory Failure -presently stable on 2-3L [place] -secondary to PE diabetes mellitus type 2 -allow for liberal control given age and co-morbidities COPD  -Clinical stable without wheezing  -Supplemental oxygen as needed  left forehead laceration  -Sutured in the emergency department  -Local skin care  Leukocytosis  -like reactive from acute medical condition -minimize workup per daughter's wishes  abnormal CT brain  -Daughter does not wish to pursue MRI of the brain  Goals of care  -discussed with daughter, she would like to speak with Dr. Marin Olp again -she is favoring transition to a focus on comfort care for the patient with no further invasive procedures and minimizing blood draws -pt is presently DNR -daughter desires transition to [facility] understands all measures with curative intent including heparin drip will be stopped. BPH -continue Flomax    Family Communication:   Daughter updated at beside--total time 35 min, >50% spent counseling and coordinating care Disposition Plan:   [place] pending discussion with Dr. Marin Olp and daughter's  decision--?hopefully 08/19/14       Procedures/Studies: Dg Chest 2 View  08/09/2014   CLINICAL DATA:  History of lung carcinoma diagnosed 1 year ago, with pneumonia diagnosed 2 weeks ago, persistent cough, former smoking history  EXAM: CHEST  2 VIEW  COMPARISON:  Chest x-ray of 07/12/2014 and CT chest of 05/07/2014  FINDINGS: The irregular opacity overlying the left hilum has improved slightly and may be due to scarring. The nodular opacity noted previously at the left lung base in the left lower lobe on the lateral view posteriorly persists. This is worrisome for recurrence of neoplasm and CT of the chest with IV contrast media is recommended. Linear scarring at the right lung base remains. Mediastinal and hilar contours are otherwise unchanged and the heart remains within normal limits in size. A right-sided Port-A-Cath tip is seen to the lower SVC. No bony abnormality is noted.  IMPRESSION: 1. Persistent nodular opacity at the left lung base probably within the posterior left lower lobe worrisome for recurrence of lung carcinoma. 2. Probable scarring overlying the left hilum. To assess both of these areas further, CT of the chest with IV contrast media is recommended.   Electronically Signed   By: Ivar Drape M.D.   On: 08/09/2014 11:47   Ct Head Wo Contrast  08/17/2014   CLINICAL DATA:  Syncopal episode with resulting fall and left forehead/facial injury. Loss of consciousness. Initial encounter.  EXAM: CT HEAD WITHOUT CONTRAST  CT MAXILLOFACIAL WITHOUT CONTRAST  CT CERVICAL SPINE WITHOUT CONTRAST  TECHNIQUE: Multidetector CT imaging of the head, cervical spine, and maxillofacial structures were performed using the standard protocol without intravenous contrast. Multiplanar CT image reconstructions of the cervical spine  and maxillofacial structures were also generated.  COMPARISON:  Head CT 03/17/2014.  FINDINGS: CT HEAD FINDINGS  There is no evidence of acute intracranial hemorrhage, mass lesion, brain  edema or extra-axial fluid collection. The ventricles and subarachnoid spaces are prominent but stable. There is new ill-defined low-density along the left aspect of the tentorium on images 11 and 12, probably reflecting encephalomalacia. This localizes to the superior aspect of the left cerebellum on the reformatted images from the maxillofacial CT. Otherwise, there are stable chronic small vessel ischemic changes in the periventricular white matter. No acute cortical based infarct identified. Intracranial vascular calcifications noted.  The visualized paranasal sinuses, mastoid air cells and middle ears are clear. Left frontal scalp soft tissue injury noted. There is no evidence of underlying calvarial fracture.  CT MAXILLOFACIAL FINDINGS  Left supraorbital soft tissue laceration associated with mild pre orbital soft tissue swelling medially in the left orbit. There is no postseptal inflammation. The globes are intact. The extra-ocular muscles and optic nerves are intact. The paranasal sinuses are clear without air-fluid levels.  No evidence of acute facial fracture.  CT CERVICAL SPINE FINDINGS  The cervical alignment is normal. There is no evidence of acute fracture or traumatic subluxation. Paraspinal osteophytes are noted. There is ossification of the ligamentum nuchae.  No acute soft tissue findings are evident. There are diffuse calcifications of the carotid arteries bilaterally. Left pleural effusion and left perihilar airspace disease are partially imaged.  IMPRESSION: 1. Left supraorbital and periorbital soft tissue injury. No evidence of orbital hematoma or maxillofacial fracture. 2. New low-density in the left superior cerebellum, most likely representing encephalomalacia from an interval infarct. This could be subacute. No evidence of acute intracranial hemorrhage. 3. No evidence of acute cervical spine fracture, traumatic subluxation or static signs of instability. 4. Left pleural effusion and left  suprahilar airspace disease. Thoracic findings are further described on the concurrent chest CT.   Electronically Signed   By: Camie Patience M.D.   On: 08/17/2014 09:55   Ct Angio Chest Pe W/cm &/or Wo Cm  08/17/2014   CLINICAL DATA:  81 year old male with syncopal episode, found down with loss of consciousness. Initial encounter. Current history of lung cancer.  EXAM: CT ANGIOGRAPHY CHEST  CT ABDOMEN AND PELVIS WITH CONTRAST  TECHNIQUE: Multidetector CT imaging of the chest was performed using the standard protocol during bolus administration of intravenous contrast. Multiplanar CT image reconstructions and MIPs were obtained to evaluate the vascular anatomy. Multidetector CT imaging of the abdomen and pelvis was performed using the standard protocol during bolus administration of intravenous contrast.  CONTRAST:  175mL OMNIPAQUE IOHEXOL 350 MG/ML SOLN  COMPARISON:  Portable chest radiograph 0658 hr today. Chest CTA 05/07/2014. PET-CT 03/08/2014.  FINDINGS: CTA CHEST FINDINGS  Good contrast bolus timing in the pulmonary arterial tree. Positive bilateral pulmonary artery filling defects. Confluent right upper lobe pulmonary embolus. Confluent left lower lobe pulmonary embolus. Right greater than left main pulmonary artery clot. No saddle embolus.  RV / LV ratio = 1.2  Moderate layering left pleural effusion. No pericardial effusion. Trace layering right pleural effusion.  Atelectatic changes to some of the major airways. Compressive atelectasis throughout the left lung. Continued abnormal soft tissue at the left hilum, but now partially obscured by the left hilar pulmonary thrombus. Left lower lobe consolidation with air bronchograms is increased. Nodular left lingula peribronchovascular opacity is new since October.  Stable right lung markings since October.  Right chest porta cath. Negative thoracic inlet. Aorta and  coronary arteries with extensive calcified plaque.  Stable osseous structures in the thorax.  CT  ABDOMEN and PELVIS FINDINGS  No acute or suspicious osseous abnormality. Advanced degenerative changes at the right hip.  No pelvic free fluid. Diminutive bladder with layering density posteriorly on the left series 5, image 83. Stool in the rectum and sigmoid colon. Redundant sigmoid. Decompressed left colon, transverse colon and right colon. Diverticulosis of the right colon is stable. No active inflammation. No dilated small bowel. Stable mild gastric hiatal hernia. Decompressed stomach and duodenum. Stable liver, gallbladder, spleen, pancreas and right adrenal gland. Stable kidneys. The left adrenal nodule identified on 03/08/2014 enhances but is stable in size.  Portal venous system is patent. Extensive Aortoiliac calcified atherosclerosis noted. No abdominal free fluid. No free air.  Review of the MIP images confirms the above findings.  IMPRESSION: 1. Positive for acute PE with CT evidence of right heartstrain (RV/LV Ratio = 1.2) consistent with at least submassive (intermediate risk)PE. The presence of right heart strain has been associated with anincreased risk of morbidity and mortality. Consultation with [place] is recommended. 2. Moderate layering left pleural effusion is new since October. Left lower lobe consolidation which in this setting may be pulmonary infarct. Increase nodular left perihilar opacity. 3. Left hilar peribronchial mass known to be lung cancer persists, and partially obscured by the acute findings today. 4. Indeterminate left adrenal nodule is stable. 5. Small left bladder calculus is new since 2015. No other acute findings in the abdomen or pelvis. Critical Value/emergent results were called by telephone at the time of interpretation on 08/17/2014 at 10:06 am to Dr. Ezequiel Essex , who verbally acknowledged these results.   Electronically Signed   By: Lars Pinks M.D.   On: 08/17/2014 10:07   Ct Cervical Spine Wo Contrast  08/17/2014   CLINICAL DATA:   Syncopal episode with resulting fall and left forehead/facial injury. Loss of consciousness. Initial encounter.  EXAM: CT HEAD WITHOUT CONTRAST  CT MAXILLOFACIAL WITHOUT CONTRAST  CT CERVICAL SPINE WITHOUT CONTRAST  TECHNIQUE: Multidetector CT imaging of the head, cervical spine, and maxillofacial structures were performed using the standard protocol without intravenous contrast. Multiplanar CT image reconstructions of the cervical spine and maxillofacial structures were also generated.  COMPARISON:  Head CT 03/17/2014.  FINDINGS: CT HEAD FINDINGS  There is no evidence of acute intracranial hemorrhage, mass lesion, brain edema or extra-axial fluid collection. The ventricles and subarachnoid spaces are prominent but stable. There is new ill-defined low-density along the left aspect of the tentorium on images 11 and 12, probably reflecting encephalomalacia. This localizes to the superior aspect of the left cerebellum on the reformatted images from the maxillofacial CT. Otherwise, there are stable chronic small vessel ischemic changes in the periventricular white matter. No acute cortical based infarct identified. Intracranial vascular calcifications noted.  The visualized paranasal sinuses, mastoid air cells and middle ears are clear. Left frontal scalp soft tissue injury noted. There is no evidence of underlying calvarial fracture.  CT MAXILLOFACIAL FINDINGS  Left supraorbital soft tissue laceration associated with mild pre orbital soft tissue swelling medially in the left orbit. There is no postseptal inflammation. The globes are intact. The extra-ocular muscles and optic nerves are intact. The paranasal sinuses are clear without air-fluid levels.  No evidence of acute facial fracture.  CT CERVICAL SPINE FINDINGS  The cervical alignment is normal. There is no evidence of acute fracture or traumatic subluxation. Paraspinal osteophytes are noted. There is ossification of the  ligamentum nuchae.  No acute soft tissue  findings are evident. There are diffuse calcifications of the carotid arteries bilaterally. Left pleural effusion and left perihilar airspace disease are partially imaged.  IMPRESSION: 1. Left supraorbital and periorbital soft tissue injury. No evidence of orbital hematoma or maxillofacial fracture. 2. New low-density in the left superior cerebellum, most likely representing encephalomalacia from an interval infarct. This could be subacute. No evidence of acute intracranial hemorrhage. 3. No evidence of acute cervical spine fracture, traumatic subluxation or static signs of instability. 4. Left pleural effusion and left suprahilar airspace disease. Thoracic findings are further described on the concurrent chest CT.   Electronically Signed   By: Camie Patience M.D.   On: 08/17/2014 09:55   Ct Abdomen Pelvis W Contrast  08/17/2014   CLINICAL DATA:  81 year old male with syncopal episode, found down with loss of consciousness. Initial encounter. Current history of lung cancer.  EXAM: CT ANGIOGRAPHY CHEST  CT ABDOMEN AND PELVIS WITH CONTRAST  TECHNIQUE: Multidetector CT imaging of the chest was performed using the standard protocol during bolus administration of intravenous contrast. Multiplanar CT image reconstructions and MIPs were obtained to evaluate the vascular anatomy. Multidetector CT imaging of the abdomen and pelvis was performed using the standard protocol during bolus administration of intravenous contrast.  CONTRAST:  164mL OMNIPAQUE IOHEXOL 350 MG/ML SOLN  COMPARISON:  Portable chest radiograph 0658 hr today. Chest CTA 05/07/2014. PET-CT 03/08/2014.  FINDINGS: CTA CHEST FINDINGS  Good contrast bolus timing in the pulmonary arterial tree. Positive bilateral pulmonary artery filling defects. Confluent right upper lobe pulmonary embolus. Confluent left lower lobe pulmonary embolus. Right greater than left main pulmonary artery clot. No saddle embolus.  RV / LV ratio = 1.2  Moderate layering left pleural  effusion. No pericardial effusion. Trace layering right pleural effusion.  Atelectatic changes to some of the major airways. Compressive atelectasis throughout the left lung. Continued abnormal soft tissue at the left hilum, but now partially obscured by the left hilar pulmonary thrombus. Left lower lobe consolidation with air bronchograms is increased. Nodular left lingula peribronchovascular opacity is new since October.  Stable right lung markings since October.  Right chest porta cath. Negative thoracic inlet. Aorta and coronary arteries with extensive calcified plaque.  Stable osseous structures in the thorax.  CT ABDOMEN and PELVIS FINDINGS  No acute or suspicious osseous abnormality. Advanced degenerative changes at the right hip.  No pelvic free fluid. Diminutive bladder with layering density posteriorly on the left series 5, image 83. Stool in the rectum and sigmoid colon. Redundant sigmoid. Decompressed left colon, transverse colon and right colon. Diverticulosis of the right colon is stable. No active inflammation. No dilated small bowel. Stable mild gastric hiatal hernia. Decompressed stomach and duodenum. Stable liver, gallbladder, spleen, pancreas and right adrenal gland. Stable kidneys. The left adrenal nodule identified on 03/08/2014 enhances but is stable in size.  Portal venous system is patent. Extensive Aortoiliac calcified atherosclerosis noted. No abdominal free fluid. No free air.  Review of the MIP images confirms the above findings.  IMPRESSION: 1. Positive for acute PE with CT evidence of right heartstrain (RV/LV Ratio = 1.2) consistent with at least submassive (intermediate risk)PE. The presence of right heart strain has been associated with anincreased risk of morbidity and mortality. Consultation with [place] is recommended. 2. Moderate layering left pleural effusion is new since October. Left lower lobe consolidation which in this setting may be pulmonary  infarct. Increase nodular left perihilar opacity. 3. Left  hilar peribronchial mass known to be lung cancer persists, and partially obscured by the acute findings today. 4. Indeterminate left adrenal nodule is stable. 5. Small left bladder calculus is new since 2015. No other acute findings in the abdomen or pelvis. Critical Value/emergent results were called by telephone at the time of interpretation on 08/17/2014 at 10:06 am to Dr. Ezequiel Essex , who verbally acknowledged these results.   Electronically Signed   By: Lars Pinks M.D.   On: 08/17/2014 10:07   Dg Chest Portable 1 View  08/17/2014   CLINICAL DATA:  81 year old male with loss of consciousness, head injury. Atrial fibrillation Initial encounter. Current history of lung cancer.  EXAM: PORTABLE CHEST - 1 VIEW  COMPARISON:  08/09/2014 and earlier.  FINDINGS: Portable AP semi upright view at 0658 hrs. Stable right chest porta cath. Stable lung volumes. No pneumothorax. No pulmonary edema. Bibasilar pleural scarring or small effusions, unchanged. Nodular opacity about the left hilum also stable. No areas of worsening ventilation. Stable cardiac size and mediastinal contours.  IMPRESSION: 1. Stable radiographic appearance of the chest. See recommendations on the recent radiographs of 08/09/2014. 2. No new cardiopulmonary abnormality.   Electronically Signed   By: Lars Pinks M.D.   On: 08/17/2014 07:20   Ct Maxillofacial Wo Cm  08/17/2014   CLINICAL DATA:  Syncopal episode with resulting fall and left forehead/facial injury. Loss of consciousness. Initial encounter.  EXAM: CT HEAD WITHOUT CONTRAST  CT MAXILLOFACIAL WITHOUT CONTRAST  CT CERVICAL SPINE WITHOUT CONTRAST  TECHNIQUE: Multidetector CT imaging of the head, cervical spine, and maxillofacial structures were performed using the standard protocol without intravenous contrast. Multiplanar CT image reconstructions of the cervical spine and maxillofacial structures were also generated.  COMPARISON:  Head  CT 03/17/2014.  FINDINGS: CT HEAD FINDINGS  There is no evidence of acute intracranial hemorrhage, mass lesion, brain edema or extra-axial fluid collection. The ventricles and subarachnoid spaces are prominent but stable. There is new ill-defined low-density along the left aspect of the tentorium on images 11 and 12, probably reflecting encephalomalacia. This localizes to the superior aspect of the left cerebellum on the reformatted images from the maxillofacial CT. Otherwise, there are stable chronic small vessel ischemic changes in the periventricular white matter. No acute cortical based infarct identified. Intracranial vascular calcifications noted.  The visualized paranasal sinuses, mastoid air cells and middle ears are clear. Left frontal scalp soft tissue injury noted. There is no evidence of underlying calvarial fracture.  CT MAXILLOFACIAL FINDINGS  Left supraorbital soft tissue laceration associated with mild pre orbital soft tissue swelling medially in the left orbit. There is no postseptal inflammation. The globes are intact. The extra-ocular muscles and optic nerves are intact. The paranasal sinuses are clear without air-fluid levels.  No evidence of acute facial fracture.  CT CERVICAL SPINE FINDINGS  The cervical alignment is normal. There is no evidence of acute fracture or traumatic subluxation. Paraspinal osteophytes are noted. There is ossification of the ligamentum nuchae.  No acute soft tissue findings are evident. There are diffuse calcifications of the carotid arteries bilaterally. Left pleural effusion and left perihilar airspace disease are partially imaged.  IMPRESSION: 1. Left supraorbital and periorbital soft tissue injury. No evidence of orbital hematoma or maxillofacial fracture. 2. New low-density in the left superior cerebellum, most likely representing encephalomalacia from an interval infarct. This could be subacute. No evidence of acute intracranial hemorrhage. 3. No evidence of  acute cervical spine fracture, traumatic subluxation or static signs of instability. 4.  Left pleural effusion and left suprahilar airspace disease. Thoracic findings are further described on the concurrent chest CT.   Electronically Signed   By: Camie Patience M.D.   On: 08/17/2014 09:55         Subjective: Pt c/o inspiratory cp.  He has sob with minimal exertion.  Denies f/c/ hemoptysis, n/v/d, abdominal pain  Objective: Filed Vitals:   08/17/14 2123 08/18/14 0544 08/18/14 1120 08/18/14 1130  BP: 138/75 151/85 134/93 113/64  Pulse: 76 94    Temp: 97.9 F (36.6 C) 98.9 F (37.2 C)    TempSrc: Oral Oral    Resp: 18 20    Height:      Weight:      SpO2: 97% 95%      Intake/Output Summary (Last 24 hours) at 08/18/14 1210 Last data filed at 08/18/14 0500  Gross per 24 hour  Intake    370 ml  Output    575 ml  Net   -205 ml   Weight change:  Exam:   General:  Pt is alert, follows commands appropriately, not in acute distress  HEENT: No icterus, No thrush,  [place]/AT  Cardiovascular: RRR, S1/S2, no rubs, no gallops  Respiratory: bibasilar crackles, no wheese  Abdomen: Soft/+BS, non tender, non distended, no guarding  Extremities: No edema, No lymphangitis, No petechiae, No rashes, no synovitis  Data Reviewed: Basic Metabolic Panel:  Recent Labs Lab 08/17/14 0706 08/17/14 0752 08/18/14 0518  NA 135 138 135  K 3.2* 3.3* 5.3*  CL 103 103 105  CO2 23  --  24  GLUCOSE 208* 206* 155*  BUN 9 13 10   CREATININE 1.48* 1.30 1.46*  CALCIUM 7.7*  --  8.4   Liver Function Tests:  Recent Labs Lab 08/17/14 0706  AST 22  ALT 18  ALKPHOS 108  BILITOT 0.6  PROT 5.7*  ALBUMIN 2.0*   No results for input(s): LIPASE, AMYLASE in the last 168 hours. No results for input(s): AMMONIA in the last 168 hours. CBC:  Recent Labs Lab 08/17/14 0706 08/17/14 0752 08/18/14 0518  WBC 10.4  --  15.8*  NEUTROABS 7.7  --   --   HGB 13.3 18.0* 12.8*  HCT 40.8 53.0* 40.7  MCV  95.1  --  96.2  PLT PLATELET CLUMPS NOTED ON SMEAR, UNABLE TO ESTIMATE  --  PLATELET CLUMPS NOTED ON SMEAR, COUNT APPEARS DECREASED   Cardiac Enzymes: No results for input(s): CKTOTAL, CKMB, CKMBINDEX, TROPONINI in the last 168 hours. BNP: Invalid input(s): POCBNP CBG:  Recent Labs Lab 08/17/14 0701 08/17/14 1705 08/17/14 2119 08/18/14 0559  GLUCAP 185* 138* 139* 154*    No results found for this or any previous visit (from the past 240 hour(s)).   Scheduled Meds: . citalopram  20 mg Oral BID  . feeding supplement (ENSURE COMPLETE)  237 mL Oral BID BM  . insulin aspart  0-5 Units Subcutaneous QHS  . insulin aspart  0-9 Units Subcutaneous TID WC  . insulin detemir  5 Units Subcutaneous Daily  . lidocaine (PF)      . sodium chloride  3 mL Intravenous Q12H  . tamsulosin  0.4 mg Oral Daily   Continuous Infusions: . heparin 1,150 Units/hr (08/17/14 2203)     Laquisha Northcraft, DO  [facility] Pager [phone]  If 7PM-7AM, please contact night-coverage [email or web address] Password [email or web address] 08/18/2014, 12:10 PM   LOS: 1 day

## 2014-08-18 NOTE — Progress Notes (Addendum)
ANTICOAGULATION CONSULT NOTE - Follow-up Consult  Pharmacy Consult for lovenox ->heparin Indication: pulmonary embolus  Allergies  Allergen Reactions  . Exenatide     REACTION: nausea    Patient Measurements: Height: 5\' 8"  (172.7 cm) Weight: 166 lb (75.297 kg) IBW/kg (Calculated) : 68.4 Heparin Dosing Weight: 73 kg  Vital Signs: Temp: 98.9 F (37.2 C) (02/04 0544) Temp Source: Oral (02/04 0544) BP: 151/85 mmHg (02/04 0544) Pulse Rate: 94 (02/04 0544)  Labs:  Recent Labs  08/17/14 0706 08/17/14 0752 08/18/14 0518 08/18/14 0630  HGB 13.3 18.0* 12.8*  --   HCT 40.8 53.0* 40.7  --   PLT PLATELET CLUMPS NOTED ON SMEAR, UNABLE TO ESTIMATE  --  PLATELET CLUMPS NOTED ON SMEAR, COUNT APPEARS DECREASED  --   LABPROT 14.1  --   --   --   INR 1.07  --   --   --   HEPARINUNFRC  --   --   --  0.88*  CREATININE 1.48* 1.30 1.46*  --     Estimated Creatinine Clearance: 39.7 mL/min (by C-G formula based on Cr of 1.46).  Assessment: 81 yo male initially found unresponsive and incontinent.  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  Last received chemotherapy in fall of 2015. CTA showing bilateral submassive PE with right heart strain. He is now on IV heparin, heparin level 0.88 slightly above goal on 1150 units/hr. He did received one dose of lovenox 75 mg yesterday at 1400, with poor renal function (est. crcl ~ 40 ml/min), lovenox is probably not completely cleared yet, which could also elevate anti-Xa level. Hgb 12.8, stable, no pltc since this admission since platelet clumped x 2. He did have hx of thrombocytopenia, most recent pltc 123 on 1/26.  Noted h/o hematuria/nosebleed when on anticoagulation in the past for DVT.  Noted palliative care note that states to minimize labs which will be difficult with heparin gtt.  Goal of Therapy:  Heparin level 0.3-0.7 units/ml Monitor platelets by anticoagulation protocol: Yes   Plan:  Continue Heparin gtt at current  rate 1150 units/hr.  Will Recheck 8 hour heparin level and aPTT at 1400 (~ 24 hrs after lovenox) Daily CBC, heparin level F/u care decisions  Maryanna Shape, PharmD, BCPS  Clinical Pharmacist  Pager: [phone]  08/18/2014 9:58 AM   Addendum: Recheck heparin level 0.65, high-end goal, aPTT 104 also high-end goal.  Plan: Decrease heparin rate slightly to 1100 units/hr F/u AM labs

## 2014-08-18 NOTE — Clinical Social Work Note (Signed)
CSW received consult for residential hospice placement at [facility] spoke with pt oncologist- anticipated that patient will be ready for placement Monday 08/22/14.  CSW will continue to follow.  Domenica Reamer, [place] Social Worker [phone]

## 2014-08-18 NOTE — Consult Note (Signed)
Reason for Consult:Facial trauma Referring Physician: primary team  Gregory Farrell is an 81 y.o. male.  HPI: The patient is a 81 yrs old wm here with his wife and daughter for treatment of pulmonary fusions and facial trauma from a fall. Upon first visit he was out of the room in a procedure.  Second visit to the room, the patient and family were not concerned with the laceration and did not feel further treatment was desired.  Past Medical History  Diagnosis Date  . Hypertension   . COPD (chronic obstructive pulmonary disease)   . GERD (gastroesophageal reflux disease)   . Renal insufficiency   . Hyperlipidemia   . CAD (coronary artery disease)   . B12 deficiency   . Memory loss     "a little" (08/17/2014)  . Vertigo   . Overweight 01/31/2013  . DVT (deep venous thrombosis) 04/2014  . PE (pulmonary embolism) 08/17/2014  . Type II diabetes mellitus     type II, poly neuropathy  . History of blood transfusion 04/2014    "perk him up"  . Lung cancer, hilus 03/22/2014    Past Surgical History  Procedure Laterality Date  . Video bronchoscopy Bilateral 03/07/2014    Procedure: VIDEO BRONCHOSCOPY WITHOUT FLUORO;  Surgeon: Kathee Delton, MD;  Location: [facility];  Service: Cardiopulmonary;  Laterality: Bilateral;  . Esophagogastroduodenoscopy N/A 05/12/2014    Procedure: ESOPHAGOGASTRODUODENOSCOPY (EGD);  Surgeon: Arta Silence, MD;  Location: [facility];  Service: Endoscopy;  Laterality: N/A;  . Inguinal hernia repair Right 2010  . Coronary angioplasty with stent placement  "early 2000's"    "2"  . Cataract extraction w/ intraocular lens  implant, bilateral Bilateral     Family History  Problem Relation Age of Onset  . Coronary artery disease Mother   . Breast cancer Mother   . Diabetes Mother   . Leukemia Brother     Social History:  reports that he quit smoking about 12 years ago. His smoking use included Cigarettes. He started smoking about 31 years ago. He has a 20 pack-year  smoking history. He has never used smokeless tobacco. He reports that he does not drink alcohol or use illicit drugs.  Allergies:  Allergies  Allergen Reactions  . Exenatide     REACTION: nausea    Medications: I have reviewed the patient's current medications.  Results for orders placed or performed during the hospital encounter of 08/17/14 (from the past 48 hour(s))  CBG, ED     Status: Abnormal   Collection Time: 08/17/14  7:01 AM  Result Value Ref Range   Glucose-Capillary 185 (H) 70 - 99 mg/dL  CBC with Differential     Status: None   Collection Time: 08/17/14  7:06 AM  Result Value Ref Range   WBC 10.4 4.0 - 10.5 K/uL    Comment: WHITE COUNT CONFIRMED ON SMEAR   RBC 4.29 4.22 - 5.81 MIL/uL   Hemoglobin 13.3 13.0 - 17.0 g/dL   HCT 40.8 39.0 - 52.0 %   MCV 95.1 78.0 - 100.0 fL   MCH 31.0 26.0 - 34.0 pg   MCHC 32.6 30.0 - 36.0 g/dL   RDW 15.0 11.5 - 15.5 %   Platelets PLATELET CLUMPS NOTED ON SMEAR, UNABLE TO ESTIMATE 150 - 400 K/uL    Comment: PLATELET CLUMPING, SUGGEST RECOLLECTION OF SAMPLE IN CITRATE TUBE.   Neutrophils Relative % 74 43 - 77 %   Lymphocytes Relative 15 12 - 46 %   Monocytes  Relative 9 3 - 12 %   Eosinophils Relative 2 0 - 5 %   Basophils Relative 0 0 - 1 %   Neutro Abs 7.7 1.7 - 7.7 K/uL   Lymphs Abs 1.6 0.7 - 4.0 K/uL   Monocytes Absolute 0.9 0.1 - 1.0 K/uL   Eosinophils Absolute 0.2 0.0 - 0.7 K/uL   Basophils Absolute 0.0 0.0 - 0.1 K/uL   Smear Review PLATELET CLUMPS NOTED ON SMEAR   Comprehensive metabolic panel     Status: Abnormal   Collection Time: 08/17/14  7:06 AM  Result Value Ref Range   Sodium 135 135 - 145 mmol/L   Potassium 3.2 (L) 3.5 - 5.1 mmol/L   Chloride 103 96 - 112 mmol/L   CO2 23 19 - 32 mmol/L   Glucose, Bld 208 (H) 70 - 99 mg/dL   BUN 9 6 - 23 mg/dL   Creatinine, Ser 1.48 (H) 0.50 - 1.35 mg/dL   Calcium 7.7 (L) 8.4 - 10.5 mg/dL   Total Protein 5.7 (L) 6.0 - 8.3 g/dL   Albumin 2.0 (L) 3.5 - 5.2 g/dL   AST 22 0 - 37  U/L   ALT 18 0 - 53 U/L   Alkaline Phosphatase 108 39 - 117 U/L   Total Bilirubin 0.6 0.3 - 1.2 mg/dL   GFR calc non Af Amer 43 (L) >90 mL/min   GFR calc Af Amer 50 (L) >90 mL/min    Comment: (NOTE) The eGFR has been calculated using the CKD EPI equation. This calculation has not been validated in all clinical situations. eGFR's persistently <90 mL/min signify possible Chronic Kidney Disease.    Anion gap 9 5 - 15  Protime-INR     Status: None   Collection Time: 08/17/14  7:06 AM  Result Value Ref Range   Prothrombin Time 14.1 11.6 - 15.2 seconds   INR 1.07 0.00 - 1.49  Brain natriuretic peptide     Status: Abnormal   Collection Time: 08/17/14  7:06 AM  Result Value Ref Range   B Natriuretic Peptide 272.1 (H) 0.0 - 100.0 pg/mL  Urinalysis, Routine w reflex microscopic     Status: None   Collection Time: 08/17/14  7:40 AM  Result Value Ref Range   Color, Urine YELLOW YELLOW   APPearance CLEAR CLEAR   Specific Gravity, Urine 1.015 1.005 - 1.030   pH 5.0 5.0 - 8.0   Glucose, UA NEGATIVE NEGATIVE mg/dL   Hgb urine dipstick NEGATIVE NEGATIVE   Bilirubin Urine NEGATIVE NEGATIVE   Ketones, ur NEGATIVE NEGATIVE mg/dL   Protein, ur NEGATIVE NEGATIVE mg/dL   Urobilinogen, UA 0.2 0.0 - 1.0 mg/dL   Nitrite NEGATIVE NEGATIVE   Leukocytes, UA NEGATIVE NEGATIVE    Comment: MICROSCOPIC NOT DONE ON URINES WITH NEGATIVE PROTEIN, BLOOD, LEUKOCYTES, NITRITE, OR GLUCOSE <1000 mg/dL.  I-stat troponin, ED     Status: None   Collection Time: 08/17/14  7:51 AM  Result Value Ref Range   Troponin i, poc 0.04 0.00 - 0.08 ng/mL   Comment 3            Comment: Due to the release kinetics of cTnI, a negative result within the first hours of the onset of symptoms does not rule out myocardial infarction with certainty. If myocardial infarction is still suspected, repeat the test at appropriate intervals.   I-stat chem 8, ed     Status: Abnormal   Collection Time: 08/17/14  7:52 AM  Result Value  Ref Range  Sodium 138 135 - 145 mmol/L   Potassium 3.3 (L) 3.5 - 5.1 mmol/L   Chloride 103 96 - 112 mmol/L   BUN 13 6 - 23 mg/dL   Creatinine, Ser 1.30 0.50 - 1.35 mg/dL   Glucose, Bld 206 (H) 70 - 99 mg/dL   Calcium, Ion 1.03 (L) 1.13 - 1.30 mmol/L   TCO2 19 0 - 100 mmol/L   Hemoglobin 18.0 (H) 13.0 - 17.0 g/dL   HCT 53.0 (H) 39.0 - 52.0 %  Glucose, capillary     Status: Abnormal   Collection Time: 08/17/14  5:05 PM  Result Value Ref Range   Glucose-Capillary 138 (H) 70 - 99 mg/dL   Comment 1 Documented in Chart    Comment 2 Notify RN   Glucose, capillary     Status: Abnormal   Collection Time: 08/17/14  9:19 PM  Result Value Ref Range   Glucose-Capillary 139 (H) 70 - 99 mg/dL   Comment 1 Documented in Chart    Comment 2 Notify RN   Basic metabolic panel     Status: Abnormal   Collection Time: 08/18/14  5:18 AM  Result Value Ref Range   Sodium 135 135 - 145 mmol/L   Potassium 5.3 (H) 3.5 - 5.1 mmol/L    Comment: DELTA CHECK NOTED NO VISIBLE HEMOLYSIS    Chloride 105 96 - 112 mmol/L   CO2 24 19 - 32 mmol/L   Glucose, Bld 155 (H) 70 - 99 mg/dL   BUN 10 6 - 23 mg/dL   Creatinine, Ser 1.46 (H) 0.50 - 1.35 mg/dL   Calcium 8.4 8.4 - 10.5 mg/dL   GFR calc non Af Amer 44 (L) >90 mL/min   GFR calc Af Amer 51 (L) >90 mL/min    Comment: (NOTE) The eGFR has been calculated using the CKD EPI equation. This calculation has not been validated in all clinical situations. eGFR's persistently <90 mL/min signify possible Chronic Kidney Disease.    Anion gap 6 5 - 15  CBC     Status: Abnormal   Collection Time: 08/18/14  5:18 AM  Result Value Ref Range   WBC 15.8 (H) 4.0 - 10.5 K/uL   RBC 4.23 4.22 - 5.81 MIL/uL   Hemoglobin 12.8 (L) 13.0 - 17.0 g/dL    Comment: REPEATED TO VERIFY   HCT 40.7 39.0 - 52.0 %   MCV 96.2 78.0 - 100.0 fL   MCH 30.3 26.0 - 34.0 pg   MCHC 31.4 30.0 - 36.0 g/dL   RDW 15.0 11.5 - 15.5 %   Platelets  150 - 400 K/uL    PLATELET CLUMPS NOTED ON SMEAR,  COUNT APPEARS DECREASED  Prealbumin     Status: Abnormal   Collection Time: 08/18/14  5:18 AM  Result Value Ref Range   Prealbumin 8.6 (L) 17.0 - 34.0 mg/dL    Comment: Performed at [facility]  Glucose, capillary     Status: Abnormal   Collection Time: 08/18/14  5:59 AM  Result Value Ref Range   Glucose-Capillary 154 (H) 70 - 99 mg/dL  Heparin level (unfractionated)     Status: Abnormal   Collection Time: 08/18/14  6:30 AM  Result Value Ref Range   Heparin Unfractionated 0.88 (H) 0.30 - 0.70 IU/mL    Comment:        IF HEPARIN RESULTS ARE BELOW EXPECTED VALUES, AND PATIENT DOSAGE HAS BEEN CONFIRMED, SUGGEST FOLLOW UP TESTING OF ANTITHROMBIN III LEVELS.   Glucose, capillary  Status: Abnormal   Collection Time: 08/18/14 12:10 PM  Result Value Ref Range   Glucose-Capillary 221 (H) 70 - 99 mg/dL  APTT     Status: Abnormal   Collection Time: 08/18/14  1:40 PM  Result Value Ref Range   aPTT 104 (H) 24 - 37 seconds    Comment:        IF BASELINE aPTT IS ELEVATED, SUGGEST PATIENT RISK ASSESSMENT BE USED TO DETERMINE APPROPRIATE ANTICOAGULANT THERAPY.   Heparin level (unfractionated)     Status: None   Collection Time: 08/18/14  1:40 PM  Result Value Ref Range   Heparin Unfractionated 0.65 0.30 - 0.70 IU/mL    Comment:        IF HEPARIN RESULTS ARE BELOW EXPECTED VALUES, AND PATIENT DOSAGE HAS BEEN CONFIRMED, SUGGEST FOLLOW UP TESTING OF ANTITHROMBIN III LEVELS.     Dg Chest 1 View  08/18/2014   CLINICAL DATA:  Status post left-sided thoracentesis  EXAM: CHEST  1 VIEW  COMPARISON:  Chest radiograph and chest CT August 17, 2014  FINDINGS: There is no demonstrable pneumothorax post thoracentesis. There is no appreciable effusion seen on the left currently. There is patchy atelectasis in the left mid lung region. The right lung is clear. Heart size and pulmonary vascularity are normal. There is atherosclerotic change in aorta. Port-A-Cath tip is in the superior vena  cava. There are foci of calcification in both carotid arteries as well as in the aortic arch. Bones are osteoporotic.  IMPRESSION: No pneumothorax. Resolution of left effusion. No frank edema or consolidation. Left midlung atelectasis remains.   Electronically Signed   By: Lowella Grip M.D.   On: 08/18/2014 12:17   Ct Head Wo Contrast  08/17/2014   CLINICAL DATA:  Syncopal episode with resulting fall and left forehead/facial injury. Loss of consciousness. Initial encounter.  EXAM: CT HEAD WITHOUT CONTRAST  CT MAXILLOFACIAL WITHOUT CONTRAST  CT CERVICAL SPINE WITHOUT CONTRAST  TECHNIQUE: Multidetector CT imaging of the head, cervical spine, and maxillofacial structures were performed using the standard protocol without intravenous contrast. Multiplanar CT image reconstructions of the cervical spine and maxillofacial structures were also generated.  COMPARISON:  Head CT 03/17/2014.  FINDINGS: CT HEAD FINDINGS  There is no evidence of acute intracranial hemorrhage, mass lesion, brain edema or extra-axial fluid collection. The ventricles and subarachnoid spaces are prominent but stable. There is new ill-defined low-density along the left aspect of the tentorium on images 11 and 12, probably reflecting encephalomalacia. This localizes to the superior aspect of the left cerebellum on the reformatted images from the maxillofacial CT. Otherwise, there are stable chronic small vessel ischemic changes in the periventricular white matter. No acute cortical based infarct identified. Intracranial vascular calcifications noted.  The visualized paranasal sinuses, mastoid air cells and middle ears are clear. Left frontal scalp soft tissue injury noted. There is no evidence of underlying calvarial fracture.  CT MAXILLOFACIAL FINDINGS  Left supraorbital soft tissue laceration associated with mild pre orbital soft tissue swelling medially in the left orbit. There is no postseptal inflammation. The globes are intact. The  extra-ocular muscles and optic nerves are intact. The paranasal sinuses are clear without air-fluid levels.  No evidence of acute facial fracture.  CT CERVICAL SPINE FINDINGS  The cervical alignment is normal. There is no evidence of acute fracture or traumatic subluxation. Paraspinal osteophytes are noted. There is ossification of the ligamentum nuchae.  No acute soft tissue findings are evident. There are diffuse calcifications of the carotid arteries  bilaterally. Left pleural effusion and left perihilar airspace disease are partially imaged.  IMPRESSION: 1. Left supraorbital and periorbital soft tissue injury. No evidence of orbital hematoma or maxillofacial fracture. 2. New low-density in the left superior cerebellum, most likely representing encephalomalacia from an interval infarct. This could be subacute. No evidence of acute intracranial hemorrhage. 3. No evidence of acute cervical spine fracture, traumatic subluxation or static signs of instability. 4. Left pleural effusion and left suprahilar airspace disease. Thoracic findings are further described on the concurrent chest CT.   Electronically Signed   By: Camie Patience M.D.   On: 08/17/2014 09:55   Ct Angio Chest Pe W/cm &/or Wo Cm  08/17/2014   CLINICAL DATA:  81 year old male with syncopal episode, found down with loss of consciousness. Initial encounter. Current history of lung cancer.  EXAM: CT ANGIOGRAPHY CHEST  CT ABDOMEN AND PELVIS WITH CONTRAST  TECHNIQUE: Multidetector CT imaging of the chest was performed using the standard protocol during bolus administration of intravenous contrast. Multiplanar CT image reconstructions and MIPs were obtained to evaluate the vascular anatomy. Multidetector CT imaging of the abdomen and pelvis was performed using the standard protocol during bolus administration of intravenous contrast.  CONTRAST:  170m OMNIPAQUE IOHEXOL 350 MG/ML SOLN  COMPARISON:  Portable chest radiograph 0658 hr today. Chest CTA  05/07/2014. PET-CT 03/08/2014.  FINDINGS: CTA CHEST FINDINGS  Good contrast bolus timing in the pulmonary arterial tree. Positive bilateral pulmonary artery filling defects. Confluent right upper lobe pulmonary embolus. Confluent left lower lobe pulmonary embolus. Right greater than left main pulmonary artery clot. No saddle embolus.  RV / LV ratio = 1.2  Moderate layering left pleural effusion. No pericardial effusion. Trace layering right pleural effusion.  Atelectatic changes to some of the major airways. Compressive atelectasis throughout the left lung. Continued abnormal soft tissue at the left hilum, but now partially obscured by the left hilar pulmonary thrombus. Left lower lobe consolidation with air bronchograms is increased. Nodular left lingula peribronchovascular opacity is new since October.  Stable right lung markings since October.  Right chest porta cath. Negative thoracic inlet. Aorta and coronary arteries with extensive calcified plaque.  Stable osseous structures in the thorax.  CT ABDOMEN and PELVIS FINDINGS  No acute or suspicious osseous abnormality. Advanced degenerative changes at the right hip.  No pelvic free fluid. Diminutive bladder with layering density posteriorly on the left series 5, image 83. Stool in the rectum and sigmoid colon. Redundant sigmoid. Decompressed left colon, transverse colon and right colon. Diverticulosis of the right colon is stable. No active inflammation. No dilated small bowel. Stable mild gastric hiatal hernia. Decompressed stomach and duodenum. Stable liver, gallbladder, spleen, pancreas and right adrenal gland. Stable kidneys. The left adrenal nodule identified on 03/08/2014 enhances but is stable in size.  Portal venous system is patent. Extensive Aortoiliac calcified atherosclerosis noted. No abdominal free fluid. No free air.  Review of the MIP images confirms the above findings.  IMPRESSION: 1. Positive for acute PE with CT evidence of right heartstrain  (RV/LV Ratio = 1.2) consistent with at least submassive (intermediate risk)PE. The presence of right heart strain has been associated with anincreased risk of morbidity and mortality. Consultation with [facility] recommended. 2. Moderate layering left pleural effusion is new since October. Left lower lobe consolidation which in this setting may be pulmonary infarct. Increase nodular left perihilar opacity. 3. Left hilar peribronchial mass known to be lung cancer persists, and partially obscured by the acute findings  today. 4. Indeterminate left adrenal nodule is stable. 5. Small left bladder calculus is new since 2015. No other acute findings in the abdomen or pelvis. Critical Value/emergent results were called by telephone at the time of interpretation on 08/17/2014 at 10:06 am to Dr. Ezequiel Essex , who verbally acknowledged these results.   Electronically Signed   By: Lars Pinks M.D.   On: 08/17/2014 10:07   Ct Cervical Spine Wo Contrast  08/17/2014   CLINICAL DATA:  Syncopal episode with resulting fall and left forehead/facial injury. Loss of consciousness. Initial encounter.  EXAM: CT HEAD WITHOUT CONTRAST  CT MAXILLOFACIAL WITHOUT CONTRAST  CT CERVICAL SPINE WITHOUT CONTRAST  TECHNIQUE: Multidetector CT imaging of the head, cervical spine, and maxillofacial structures were performed using the standard protocol without intravenous contrast. Multiplanar CT image reconstructions of the cervical spine and maxillofacial structures were also generated.  COMPARISON:  Head CT 03/17/2014.  FINDINGS: CT HEAD FINDINGS  There is no evidence of acute intracranial hemorrhage, mass lesion, brain edema or extra-axial fluid collection. The ventricles and subarachnoid spaces are prominent but stable. There is new ill-defined low-density along the left aspect of the tentorium on images 11 and 12, probably reflecting encephalomalacia. This localizes to the superior aspect of the left cerebellum on the  reformatted images from the maxillofacial CT. Otherwise, there are stable chronic small vessel ischemic changes in the periventricular white matter. No acute cortical based infarct identified. Intracranial vascular calcifications noted.  The visualized paranasal sinuses, mastoid air cells and middle ears are clear. Left frontal scalp soft tissue injury noted. There is no evidence of underlying calvarial fracture.  CT MAXILLOFACIAL FINDINGS  Left supraorbital soft tissue laceration associated with mild pre orbital soft tissue swelling medially in the left orbit. There is no postseptal inflammation. The globes are intact. The extra-ocular muscles and optic nerves are intact. The paranasal sinuses are clear without air-fluid levels.  No evidence of acute facial fracture.  CT CERVICAL SPINE FINDINGS  The cervical alignment is normal. There is no evidence of acute fracture or traumatic subluxation. Paraspinal osteophytes are noted. There is ossification of the ligamentum nuchae.  No acute soft tissue findings are evident. There are diffuse calcifications of the carotid arteries bilaterally. Left pleural effusion and left perihilar airspace disease are partially imaged.  IMPRESSION: 1. Left supraorbital and periorbital soft tissue injury. No evidence of orbital hematoma or maxillofacial fracture. 2. New low-density in the left superior cerebellum, most likely representing encephalomalacia from an interval infarct. This could be subacute. No evidence of acute intracranial hemorrhage. 3. No evidence of acute cervical spine fracture, traumatic subluxation or static signs of instability. 4. Left pleural effusion and left suprahilar airspace disease. Thoracic findings are further described on the concurrent chest CT.   Electronically Signed   By: Camie Patience M.D.   On: 08/17/2014 09:55   Ct Abdomen Pelvis W Contrast  08/17/2014   CLINICAL DATA:  82 year old male with syncopal episode, found down with loss of consciousness.  Initial encounter. Current history of lung cancer.  EXAM: CT ANGIOGRAPHY CHEST  CT ABDOMEN AND PELVIS WITH CONTRAST  TECHNIQUE: Multidetector CT imaging of the chest was performed using the standard protocol during bolus administration of intravenous contrast. Multiplanar CT image reconstructions and MIPs were obtained to evaluate the vascular anatomy. Multidetector CT imaging of the abdomen and pelvis was performed using the standard protocol during bolus administration of intravenous contrast.  CONTRAST:  159m OMNIPAQUE IOHEXOL 350 MG/ML SOLN  COMPARISON:  Portable chest radiograph  0658 hr today. Chest CTA 05/07/2014. PET-CT 03/08/2014.  FINDINGS: CTA CHEST FINDINGS  Good contrast bolus timing in the pulmonary arterial tree. Positive bilateral pulmonary artery filling defects. Confluent right upper lobe pulmonary embolus. Confluent left lower lobe pulmonary embolus. Right greater than left main pulmonary artery clot. No saddle embolus.  RV / LV ratio = 1.2  Moderate layering left pleural effusion. No pericardial effusion. Trace layering right pleural effusion.  Atelectatic changes to some of the major airways. Compressive atelectasis throughout the left lung. Continued abnormal soft tissue at the left hilum, but now partially obscured by the left hilar pulmonary thrombus. Left lower lobe consolidation with air bronchograms is increased. Nodular left lingula peribronchovascular opacity is new since October.  Stable right lung markings since October.  Right chest porta cath. Negative thoracic inlet. Aorta and coronary arteries with extensive calcified plaque.  Stable osseous structures in the thorax.  CT ABDOMEN and PELVIS FINDINGS  No acute or suspicious osseous abnormality. Advanced degenerative changes at the right hip.  No pelvic free fluid. Diminutive bladder with layering density posteriorly on the left series 5, image 83. Stool in the rectum and sigmoid colon. Redundant sigmoid. Decompressed left colon,  transverse colon and right colon. Diverticulosis of the right colon is stable. No active inflammation. No dilated small bowel. Stable mild gastric hiatal hernia. Decompressed stomach and duodenum. Stable liver, gallbladder, spleen, pancreas and right adrenal gland. Stable kidneys. The left adrenal nodule identified on 03/08/2014 enhances but is stable in size.  Portal venous system is patent. Extensive Aortoiliac calcified atherosclerosis noted. No abdominal free fluid. No free air.  Review of the MIP images confirms the above findings.  IMPRESSION: 1. Positive for acute PE with CT evidence of right heartstrain (RV/LV Ratio = 1.2) consistent with at least submassive (intermediate risk)PE. The presence of right heart strain has been associated with anincreased risk of morbidity and mortality. Consultation with [place] is recommended. 2. Moderate layering left pleural effusion is new since October. Left lower lobe consolidation which in this setting may be pulmonary infarct. Increase nodular left perihilar opacity. 3. Left hilar peribronchial mass known to be lung cancer persists, and partially obscured by the acute findings today. 4. Indeterminate left adrenal nodule is stable. 5. Small left bladder calculus is new since 2015. No other acute findings in the abdomen or pelvis. Critical Value/emergent results were called by telephone at the time of interpretation on 08/17/2014 at 10:06 am to Dr. Ezequiel Essex , who verbally acknowledged these results.   Electronically Signed   By: Lars Pinks M.D.   On: 08/17/2014 10:07   Dg Chest Portable 1 View  08/17/2014   CLINICAL DATA:  81 year old male with loss of consciousness, head injury. Atrial fibrillation Initial encounter. Current history of lung cancer.  EXAM: PORTABLE CHEST - 1 VIEW  COMPARISON:  08/09/2014 and earlier.  FINDINGS: Portable AP semi upright view at 0658 hrs. Stable right chest porta cath. Stable lung volumes. No pneumothorax.  No pulmonary edema. Bibasilar pleural scarring or small effusions, unchanged. Nodular opacity about the left hilum also stable. No areas of worsening ventilation. Stable cardiac size and mediastinal contours.  IMPRESSION: 1. Stable radiographic appearance of the chest. See recommendations on the recent radiographs of 08/09/2014. 2. No new cardiopulmonary abnormality.   Electronically Signed   By: Lars Pinks M.D.   On: 08/17/2014 07:20   Ct Maxillofacial Wo Cm  08/17/2014   CLINICAL DATA:  Syncopal episode with resulting fall and left forehead/facial  injury. Loss of consciousness. Initial encounter.  EXAM: CT HEAD WITHOUT CONTRAST  CT MAXILLOFACIAL WITHOUT CONTRAST  CT CERVICAL SPINE WITHOUT CONTRAST  TECHNIQUE: Multidetector CT imaging of the head, cervical spine, and maxillofacial structures were performed using the standard protocol without intravenous contrast. Multiplanar CT image reconstructions of the cervical spine and maxillofacial structures were also generated.  COMPARISON:  Head CT 03/17/2014.  FINDINGS: CT HEAD FINDINGS  There is no evidence of acute intracranial hemorrhage, mass lesion, brain edema or extra-axial fluid collection. The ventricles and subarachnoid spaces are prominent but stable. There is new ill-defined low-density along the left aspect of the tentorium on images 11 and 12, probably reflecting encephalomalacia. This localizes to the superior aspect of the left cerebellum on the reformatted images from the maxillofacial CT. Otherwise, there are stable chronic small vessel ischemic changes in the periventricular white matter. No acute cortical based infarct identified. Intracranial vascular calcifications noted.  The visualized paranasal sinuses, mastoid air cells and middle ears are clear. Left frontal scalp soft tissue injury noted. There is no evidence of underlying calvarial fracture.  CT MAXILLOFACIAL FINDINGS  Left supraorbital soft tissue laceration associated with mild pre orbital  soft tissue swelling medially in the left orbit. There is no postseptal inflammation. The globes are intact. The extra-ocular muscles and optic nerves are intact. The paranasal sinuses are clear without air-fluid levels.  No evidence of acute facial fracture.  CT CERVICAL SPINE FINDINGS  The cervical alignment is normal. There is no evidence of acute fracture or traumatic subluxation. Paraspinal osteophytes are noted. There is ossification of the ligamentum nuchae.  No acute soft tissue findings are evident. There are diffuse calcifications of the carotid arteries bilaterally. Left pleural effusion and left perihilar airspace disease are partially imaged.  IMPRESSION: 1. Left supraorbital and periorbital soft tissue injury. No evidence of orbital hematoma or maxillofacial fracture. 2. New low-density in the left superior cerebellum, most likely representing encephalomalacia from an interval infarct. This could be subacute. No evidence of acute intracranial hemorrhage. 3. No evidence of acute cervical spine fracture, traumatic subluxation or static signs of instability. 4. Left pleural effusion and left suprahilar airspace disease. Thoracic findings are further described on the concurrent chest CT.   Electronically Signed   By: Camie Patience M.D.   On: 08/17/2014 09:55    Review of Systems  Unable to perform ROS: other  Patient and family declined  Blood pressure 121/69, pulse 88, temperature 97.5 F (36.4 C), temperature source Oral, resp. rate 18, height 5' 8"  (1.727 m), weight 75.297 kg (166 lb), SpO2 98 %. Physical Exam  Constitutional: He is oriented to person, place, and time. He appears well-developed.  HENT:  Head: Normocephalic.  Neurological: He is alert and oriented to person, place, and time.  Psychiatric: He has a normal mood and affect. His behavior is normal.    Assessment/Plan: The patient was evaluated.  Out of respect for the patient and family wishes no further treatment discussed.   I offered my services if they should change their mind. Recommend keeping the area clean and removing the sutures in one week.  Benedict 08/18/2014, 3:05 PM

## 2014-08-19 DIAGNOSIS — R0600 Dyspnea, unspecified: Secondary | ICD-10-CM

## 2014-08-19 DIAGNOSIS — J438 Other emphysema: Secondary | ICD-10-CM

## 2014-08-19 LAB — CBC
HCT: 38.1 % — ABNORMAL LOW (ref 39.0–52.0)
Hemoglobin: 12 g/dL — ABNORMAL LOW (ref 13.0–17.0)
MCH: 29.9 pg (ref 26.0–34.0)
MCHC: 31.5 g/dL (ref 30.0–36.0)
MCV: 95 fL (ref 78.0–100.0)
RBC: 4.01 MIL/uL — AB (ref 4.22–5.81)
RDW: 14.8 % (ref 11.5–15.5)
WBC: 12.3 10*3/uL — ABNORMAL HIGH (ref 4.0–10.5)

## 2014-08-19 LAB — HEMOGLOBIN A1C
Hgb A1c MFr Bld: 6.6 % — ABNORMAL HIGH (ref 4.8–5.6)
MEAN PLASMA GLUCOSE: 143 mg/dL

## 2014-08-19 LAB — GLUCOSE, CAPILLARY
GLUCOSE-CAPILLARY: 118 mg/dL — AB (ref 70–99)
Glucose-Capillary: 114 mg/dL — ABNORMAL HIGH (ref 70–99)

## 2014-08-19 LAB — HEPARIN LEVEL (UNFRACTIONATED): Heparin Unfractionated: 0.25 IU/mL — ABNORMAL LOW (ref 0.30–0.70)

## 2014-08-19 MED ORDER — ONDANSETRON HCL 4 MG PO TABS: 4.0000 mg | ORAL_TABLET | Freq: Four times a day (QID) | ORAL | Status: AC | PRN

## 2014-08-19 MED ORDER — MORPHINE SULFATE (CONCENTRATE) 10 MG/0.5ML PO SOLN: 10.0000 mg | ORAL | Status: DC | PRN

## 2014-08-19 MED ORDER — HEPARIN SOD (PORK) LOCK FLUSH 100 UNIT/ML IV SOLN
500.0000 [IU] | INTRAVENOUS | Status: AC | PRN
  Administered 2014-08-19: 500 [IU]

## 2014-08-19 MED ORDER — FENTANYL 12 MCG/HR TD PT72
12.5000 ug | MEDICATED_PATCH | TRANSDERMAL | Status: DC
  Administered 2014-08-19: 12.5 ug via TRANSDERMAL
  Filled 2014-08-19: qty 1

## 2014-08-19 MED ORDER — ENSURE COMPLETE PO LIQD: 237.0000 mL | Freq: Two times a day (BID) | ORAL | Status: AC

## 2014-08-19 MED ORDER — MORPHINE SULFATE (CONCENTRATE) 10 MG/0.5ML PO SOLN: 10.0000 mg | ORAL | Status: AC | PRN

## 2014-08-19 MED ORDER — FENTANYL 12 MCG/HR TD PT72: 12.5000 ug | MEDICATED_PATCH | TRANSDERMAL | Status: AC

## 2014-08-19 NOTE — Progress Notes (Signed)
Medical transport is here to transfer patient to [facility]. Daughter aware of the tranfer, and is at the bedside. Patient packet given to transport personnel. Afleming, RN

## 2014-08-19 NOTE — Care Management Note (Signed)
    Page 1 of 1   08/19/2014     4:29:52 PM CARE MANAGEMENT NOTE 08/19/2014  Patient:  Gregory Farrell, Gregory Farrell   Account Number:  [id number]  Date Initiated:  08/19/2014  Documentation initiated by:  Marvetta Gibbons  Subjective/Objective Assessment:   Pt admitted with PE     Action/Plan:   PTA pt lived at home- plan for [facility] following   Anticipated DC Date:  08/19/2014   Anticipated DC Plan:  [facility] referral  Clinical Social Worker         Choice offered to / List presented to:             Status of service:  Completed, signed off Medicare Important Message given?  NA - LOS <3 / Initial given by admissions (If response is "NO", the following Medicare IM given date fields will be blank) Date Medicare IM given:   Medicare IM given by:   Date Additional Medicare IM given:   Additional Medicare IM given by:    Discharge Disposition:  [place]  Per UR Regulation:  Reviewed for med. necessity/level of care/duration of stay  If discussed at [place] of Stay Meetings, dates discussed:    Comments:

## 2014-08-19 NOTE — Progress Notes (Signed)
[facility] at the bedside and requested IV team member Shanon Brow not to de access the port site. Afleming, RN

## 2014-08-19 NOTE — Progress Notes (Signed)
Report called to [place] at [facility]. Afleming, RN

## 2014-08-19 NOTE — Clinical Social Work Note (Signed)
Patient to be d/c'ed today to [facility].  Patient and family agreeable to plans will transport via ems RN to call report.  Evette Cristal, MSW, [place]

## 2014-08-19 NOTE — Progress Notes (Signed)
Gregory Farrell is fairly stable this morning. He's having some slight dyspnea. This could be from the pulmonary embolism. He is on the heparin infusion.  I think that it would be helpful to get him on a Duragesic patch. This may help with his breathing. I think that and also would be helpful to have him on some Roxanol elixir. He can swallow this more easily.  His appetite is down.  His Dopplers were negative of his legs.  He had 750 mL of fluid removed from his left lung.  I had a long talk with his daughter yesterday by phone. She wants to make sure that he is comfortable. She does not want a lot of testing done. I can certainly agree with this.  Again, as prognosis is less than 3 weeks in my opinion.  On his physical exam, his vital signs are stable. Temperature 97.7. Pulse 83. Blood pressure 131/70. Head and neck exam shows no ocular or oral lesions. His lungs are with some decreased bilaterally at the bases. He has no wheezes. Cardiac exam regular rate and rhythm. He has a 1/6 systolic murmur. Abdomen soft. Bowel sounds are slightly decreased. Extremities shows some trace edema in his lower legs. Neurological exam is nonfocal.  If there is a bed open at [facility] certainly would have no problems getting him there. In my opinion, I think that stopping the heparin drip would be reasonable given his poor prognosis from his malignancy. I just want to make sure that he does not have any dyspnea. I am sure that at [facility], they can give him morphine nebulizers. I think the Duragesic patch will help.  I appreciate the great care that he's gotten on [facility] [address] from the hospitalist and from all the staff on the floor.   Stormy Card 3:23

## 2014-08-19 NOTE — Progress Notes (Signed)
ANTICOAGULATION CONSULT NOTE - Follow-up Consult  Pharmacy Consult for lovenox ->heparin Indication: pulmonary embolus  Allergies  Allergen Reactions  . Exenatide     REACTION: nausea    Patient Measurements: Height: 5\' 8"  (172.7 cm) Weight: 166 lb 0.1 oz (75.3 kg) IBW/kg (Calculated) : 68.4 Heparin Dosing Weight: 73 kg  Vital Signs: Temp: 97.7 F (36.5 C) (02/05 0635) Temp Source: Oral (02/05 0635) BP: 131/70 mmHg (02/05 0635) Pulse Rate: 83 (02/05 0635)  Labs:  Recent Labs  08/17/14 0706 08/17/14 0752 08/18/14 0518 08/18/14 0630 08/18/14 1340 08/19/14 0508  HGB 13.3 18.0* 12.8*  --   --  12.0*  HCT 40.8 53.0* 40.7  --   --  38.1*  PLT PLATELET CLUMPS NOTED ON SMEAR, UNABLE TO ESTIMATE  --  PLATELET CLUMPS NOTED ON SMEAR, COUNT APPEARS DECREASED  --   --  PLATELET CLUMPING, SUGGEST RECOLLECTION OF SAMPLE IN CITRATE TUBE.  APTT  --   --   --   --  104*  --   LABPROT 14.1  --   --   --   --   --   INR 1.07  --   --   --   --   --   HEPARINUNFRC  --   --   --  0.88* 0.65 0.25*  CREATININE 1.48* 1.30 1.46*  --   --   --     Estimated Creatinine Clearance: 39.7 mL/min (by C-G formula based on Cr of 1.46).  Assessment: 81 yo male initially found unresponsive and incontinent.  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  Last received chemotherapy in fall of 2015. CTA showing bilateral submassive PE with right heart strain. He is now on IV heparin, heparin level 0.25 subtherapeutic this morning. The prognosis is poor, plan for hospice care soon. Will likely not continue anticoagulation after discharge. Hgb remains stable  Noted h/o hematuria/nosebleed when on anticoagulation in the past for DVT.  Goal of Therapy:  Heparin level 0.3-0.7 units/ml Monitor platelets by anticoagulation protocol: Yes   Plan:  Increased Heparin gtt slightly to 1200 units/hr this morning Will leave at current rate until discharge Will f/u AM labs if pt. Is still  here.   Maryanna Shape, PharmD, BCPS  Clinical Pharmacist  Pager: [phone]  08/19/2014 10:39 AM

## 2014-08-19 NOTE — Clinical Social Work Note (Signed)
Family agreeable to residential hospice placement at [place] made referral to [facility].  CSW will continue to follow.  Domenica Reamer, [place] Social Worker [phone]

## 2014-08-19 NOTE — Discharge Summary (Signed)
Physician Discharge Summary  Gregory Farrell STM:[id number] DOB: 09/12/1932 DOA: 08/17/2014  PCP: Gregory Homans, MD  Admit date: 08/17/2014 Discharge date: 08/19/2014   PATIENT IS BEING DISCHARGED TO [facility]  Discharge Diagnoses:   Syncope -Secondary to his submassive PE with right heart strain -Continue heparin drip per Dr. Marin Farrell -venous duplex of LE as per Dr. Dolan Farrell for DVT -Cancel Echo as daughter wishes to transition to more comfort care approach and results will not change plan  Submassive PE -Continued heparin drip per Dr. Marin Farrell -At the time of discharge, all medications with therapeutic intent will be discontinued including heparin -Cancel Echo as daughter wishes to transition to more comfort care approach and results will not change plan  -[place] legs--neg DVT NonSmall Cell Lung Cancer -difficulty tolerating low dose chemo -now with recurrence pleural effusion  -Likely malignant effusion in the setting of recurrent lung cancer  -s/p thoracocentesis 08/18/14--750cc removed -Supplementary oxygen as needed  -Presently stable on 3 L nasal cannula Acute Respiratory Failure -presently stable on 2-3L [place] -secondary to PE diabetes mellitus type 2 -allow for liberal control given age and co-morbidities -Discontinue Levemir and NovoLog COPD  -Clinical stable without wheezing  -Supplemental oxygen as needed  -Albuterol when necessary shortness of breath and wheeze  left forehead laceration  -Sutured in the emergency department  -Local skin care  -Discussed with the patient's daughter who do not want any further intervention by plastic surgery  Leukocytosis  -like reactive from acute medical condition--no fevers, intermittent hemodynamically stable -minimize workup per daughter's wishes abnormal CT brain  -Daughter does not wish to pursue MRI of the brain  Goals of care  -discussed with daughter on multiple occasion who wants to transition the patient's  care to focus on full comfort -daughter desires transition to [facility] understands all measures with curative intent including heparin drip will be stopped. -Case was discussed with oncology, Dr. Marin Farrell, on the day of discharge, and he agreed with transition the patient to residential hospice -Roxanol when necessary pain and shortness of breath  BPH -discontinue Flomax as the patient's focus of care has been transitioned to full comfort   STUDIES:  2/03 CTA Chest >> acute PE with CT evidence of R heart strain c/w submassive PE, mod layering L effusion, LLL consolidation, L hilar peribronchial mass, indeterminate L adrenal node 2/03 CT Cervical Spine >> no evidence of acute fracture or traumatic subluxation  2/03 CT Head >> L supraorbital & periorbital soft tissue injury, no orbital hematoma or fracture, low density in L superior cerebellum concerning for encephalomalacia from interval infarct 2/03 CT Maxillofacial >> no evidence of acute facial fx  Discharge Condition: stable  Disposition: [facility]  Diet:regular Wt Readings from Last 3 Encounters:  08/19/14 75.3 kg (166 lb 0.1 oz)  08/09/14 73.029 kg (161 lb)  07/12/14 76.204 kg (168 lb)    History of present illness:  81 y/o M with a PMH of hypertension, hyperlipidemia, CAD, GERD, renal insufficiency, B12 deficiency, diabetes mellitus, COPD, 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 He was last seen by Dr. Marin Farrell on 1/26 with notes reflective of failure to thrive - poor appetite, weight loss etc.   The patient presented to [facility] ER on 2/3 after being found down at home by his wife unresponsive and  incontinent. EMS was activated and the patient reports waking in transport. The patient suffered a laceration to the forehead,  left elbow and left wrist. He was placed on 15L oxygen after he was noted to have bradycardia with position changes. He was also noted to have Afib with RVR in route to the ER. ER work up notable for negative CT of the head, cervical spine and maxillofacial. However, CTA of the chest was positive for acute PE with CT evidence of right heart strain. VSS were stable in ER. PCCM recommended palliative medicine consultation. Positive medicine was consulted and deferred further workup to medical oncology, Dr. Marin Farrell.  After initial thoracocentesis which removed 750 mL of fluid, the patient's respiratory status improved. In addition, after discussion with Dr. Marin Farrell and the patient's daughter, Gregory Farrell decision was made to transition the patient to full comfort care. With assistance of social worker, the patient was transferred to [facility] Consultants: Med Onc Plastic Surgery IR Discharge Exam: Filed Vitals:   08/19/14 0635  BP: 131/70  Pulse: 83  Temp: 97.7 F (36.5 C)  Resp: 19   Filed Vitals:   08/18/14 1130 08/18/14 1403 08/18/14 2115 08/19/14 0635  BP: 113/64 121/69 123/66 131/70  Pulse:  88 85 83  Temp:  97.5 F (36.4 C) 97.9 F (36.6 C) 97.7 F (36.5 C)  TempSrc:  Oral Oral Oral  Resp:  18 20 19   Height:      Weight:    75.3 kg (166 lb 0.1 oz)  SpO2:  98% 95% 96%   General: A&O x 3, NAD, pleasant, cooperative Cardiovascular: RRR, no rub, no gallop, no S3 Respiratory: bibasilar rales. No wheeze  Abdomen:soft, nontender, nondistended, positive bowel sounds Extremities: No edema, No lymphangitis, no petechiae  Discharge Instructions      Discharge Instructions    Diet - low sodium heart healthy    Complete by:  As directed      Increase activity slowly    Complete by:  As directed             Medication List    STOP taking these medications        citalopram 20 MG tablet  Commonly known as:  CELEXA     insulin detemir 100 UNIT/ML injection  Commonly  known as:  LEVEMIR     lactulose 10 GM/15ML solution  Commonly known as:  CHRONULAC     methylphenidate 10 MG tablet  Commonly known as:  RITALIN     NOVOFINE 32G X 6 MM Misc  Generic drug:  Insulin Pen Needle     tamsulosin 0.4 MG Caps capsule  Commonly known as:  FLOMAX     zolpidem 5 MG tablet  Commonly known as:  AMBIEN      TAKE these medications        albuterol 108 (90 BASE) MCG/ACT inhaler  Commonly known as:  PROVENTIL HFA;VENTOLIN HFA  Inhale 2 puffs into the lungs every 6 (six) hours as needed for wheezing or shortness of breath (cough).     feeding supplement (ENSURE COMPLETE) Liqd  Take 237 mLs by mouth 2 (two) times daily between meals.     fentaNYL 12 MCG/HR  Commonly known as:  DURAGESIC - dosed mcg/hr  Place 1 patch (12.5 mcg total) onto the skin every 3 (three) days.     morphine CONCENTRATE 10 MG/0.5ML Soln concentrated solution  Take 0.5 mLs (10 mg total) by mouth every 2 (two) hours as needed for moderate pain  or shortness of breath.     ondansetron 4 MG tablet  Commonly known as:  ZOFRAN  Take 1 tablet (4 mg total) by mouth every 6 (six) hours as needed for nausea.     pantoprazole 20 MG tablet  Commonly known as:  PROTONIX  Take 40 mg by mouth daily.         The results of significant diagnostics from this hospitalization (including imaging, microbiology, ancillary and laboratory) are listed below for reference.    Significant Diagnostic Studies: Dg Chest 1 View  08/18/2014   CLINICAL DATA:  Status post left-sided thoracentesis  EXAM: CHEST  1 VIEW  COMPARISON:  Chest radiograph and chest CT August 17, 2014  FINDINGS: There is no demonstrable pneumothorax post thoracentesis. There is no appreciable effusion seen on the left currently. There is patchy atelectasis in the left mid lung region. The right lung is clear. Heart size and pulmonary vascularity are normal. There is atherosclerotic change in aorta. Port-A-Cath tip is in the superior vena  cava. There are foci of calcification in both carotid arteries as well as in the aortic arch. Bones are osteoporotic.  IMPRESSION: No pneumothorax. Resolution of left effusion. No frank edema or consolidation. Left midlung atelectasis remains.   Electronically Signed   By: Lowella Grip M.D.   On: 08/18/2014 12:17   Dg Chest 2 View  08/09/2014   CLINICAL DATA:  History of lung carcinoma diagnosed 1 year ago, with pneumonia diagnosed 2 weeks ago, persistent cough, former smoking history  EXAM: CHEST  2 VIEW  COMPARISON:  Chest x-ray of 07/12/2014 and CT chest of 05/07/2014  FINDINGS: The irregular opacity overlying the left hilum has improved slightly and may be due to scarring. The nodular opacity noted previously at the left lung base in the left lower lobe on the lateral view posteriorly persists. This is worrisome for recurrence of neoplasm and CT of the chest with IV contrast media is recommended. Linear scarring at the right lung base remains. Mediastinal and hilar contours are otherwise unchanged and the heart remains within normal limits in size. A right-sided Port-A-Cath tip is seen to the lower SVC. No bony abnormality is noted.  IMPRESSION: 1. Persistent nodular opacity at the left lung base probably within the posterior left lower lobe worrisome for recurrence of lung carcinoma. 2. Probable scarring overlying the left hilum. To assess both of these areas further, CT of the chest with IV contrast media is recommended.   Electronically Signed   By: Ivar Drape M.D.   On: 08/09/2014 11:47   Ct Head Wo Contrast  08/17/2014   CLINICAL DATA:  Syncopal episode with resulting fall and left forehead/facial injury. Loss of consciousness. Initial encounter.  EXAM: CT HEAD WITHOUT CONTRAST  CT MAXILLOFACIAL WITHOUT CONTRAST  CT CERVICAL SPINE WITHOUT CONTRAST  TECHNIQUE: Multidetector CT imaging of the head, cervical spine, and maxillofacial structures were performed using the standard protocol without  intravenous contrast. Multiplanar CT image reconstructions of the cervical spine and maxillofacial structures were also generated.  COMPARISON:  Head CT 03/17/2014.  FINDINGS: CT HEAD FINDINGS  There is no evidence of acute intracranial hemorrhage, mass lesion, brain edema or extra-axial fluid collection. The ventricles and subarachnoid spaces are prominent but stable. There is new ill-defined low-density along the left aspect of the tentorium on images 11 and 12, probably reflecting encephalomalacia. This localizes to the superior aspect of the left cerebellum on the reformatted images from the maxillofacial CT. Otherwise, there are stable chronic  small vessel ischemic changes in the periventricular white matter. No acute cortical based infarct identified. Intracranial vascular calcifications noted.  The visualized paranasal sinuses, mastoid air cells and middle ears are clear. Left frontal scalp soft tissue injury noted. There is no evidence of underlying calvarial fracture.  CT MAXILLOFACIAL FINDINGS  Left supraorbital soft tissue laceration associated with mild pre orbital soft tissue swelling medially in the left orbit. There is no postseptal inflammation. The globes are intact. The extra-ocular muscles and optic nerves are intact. The paranasal sinuses are clear without air-fluid levels.  No evidence of acute facial fracture.  CT CERVICAL SPINE FINDINGS  The cervical alignment is normal. There is no evidence of acute fracture or traumatic subluxation. Paraspinal osteophytes are noted. There is ossification of the ligamentum nuchae.  No acute soft tissue findings are evident. There are diffuse calcifications of the carotid arteries bilaterally. Left pleural effusion and left perihilar airspace disease are partially imaged.  IMPRESSION: 1. Left supraorbital and periorbital soft tissue injury. No evidence of orbital hematoma or maxillofacial fracture. 2. New low-density in the left superior cerebellum, most  likely representing encephalomalacia from an interval infarct. This could be subacute. No evidence of acute intracranial hemorrhage. 3. No evidence of acute cervical spine fracture, traumatic subluxation or static signs of instability. 4. Left pleural effusion and left suprahilar airspace disease. Thoracic findings are further described on the concurrent chest CT.   Electronically Signed   By: Camie Patience M.D.   On: 08/17/2014 09:55   Ct Angio Chest Pe W/cm &/or Wo Cm  08/17/2014   CLINICAL DATA:  81 year old male with syncopal episode, found down with loss of consciousness. Initial encounter. Current history of lung cancer.  EXAM: CT ANGIOGRAPHY CHEST  CT ABDOMEN AND PELVIS WITH CONTRAST  TECHNIQUE: Multidetector CT imaging of the chest was performed using the standard protocol during bolus administration of intravenous contrast. Multiplanar CT image reconstructions and MIPs were obtained to evaluate the vascular anatomy. Multidetector CT imaging of the abdomen and pelvis was performed using the standard protocol during bolus administration of intravenous contrast.  CONTRAST:  121mL OMNIPAQUE IOHEXOL 350 MG/ML SOLN  COMPARISON:  Portable chest radiograph 0658 hr today. Chest CTA 05/07/2014. PET-CT 03/08/2014.  FINDINGS: CTA CHEST FINDINGS  Good contrast bolus timing in the pulmonary arterial tree. Positive bilateral pulmonary artery filling defects. Confluent right upper lobe pulmonary embolus. Confluent left lower lobe pulmonary embolus. Right greater than left main pulmonary artery clot. No saddle embolus.  RV / LV ratio = 1.2  Moderate layering left pleural effusion. No pericardial effusion. Trace layering right pleural effusion.  Atelectatic changes to some of the major airways. Compressive atelectasis throughout the left lung. Continued abnormal soft tissue at the left hilum, but now partially obscured by the left hilar pulmonary thrombus. Left lower lobe consolidation with air bronchograms is increased.  Nodular left lingula peribronchovascular opacity is new since October.  Stable right lung markings since October.  Right chest porta cath. Negative thoracic inlet. Aorta and coronary arteries with extensive calcified plaque.  Stable osseous structures in the thorax.  CT ABDOMEN and PELVIS FINDINGS  No acute or suspicious osseous abnormality. Advanced degenerative changes at the right hip.  No pelvic free fluid. Diminutive bladder with layering density posteriorly on the left series 5, image 83. Stool in the rectum and sigmoid colon. Redundant sigmoid. Decompressed left colon, transverse colon and right colon. Diverticulosis of the right colon is stable. No active inflammation. No dilated small bowel. Stable mild gastric hiatal  hernia. Decompressed stomach and duodenum. Stable liver, gallbladder, spleen, pancreas and right adrenal gland. Stable kidneys. The left adrenal nodule identified on 03/08/2014 enhances but is stable in size.  Portal venous system is patent. Extensive Aortoiliac calcified atherosclerosis noted. No abdominal free fluid. No free air.  Review of the MIP images confirms the above findings.  IMPRESSION: 1. Positive for acute PE with CT evidence of right heartstrain (RV/LV Ratio = 1.2) consistent with at least submassive (intermediate risk)PE. The presence of right heart strain has been associated with anincreased risk of morbidity and mortality. Consultation with [facility] is recommended. 2. Moderate layering left pleural effusion is new since October. Left lower lobe consolidation which in this setting may be pulmonary infarct. Increase nodular left perihilar opacity. 3. Left hilar peribronchial mass known to be lung cancer persists, and partially obscured by the acute findings today. 4. Indeterminate left adrenal nodule is stable. 5. Small left bladder calculus is new since 2015. No other acute findings in the abdomen or pelvis. Critical Value/emergent results were  called by telephone at the time of interpretation on 08/17/2014 at 10:06 am to Dr. Ezequiel Essex , who verbally acknowledged these results.   Electronically Signed   By: Lars Pinks M.D.   On: 08/17/2014 10:07   Ct Cervical Spine Wo Contrast  08/17/2014   CLINICAL DATA:  Syncopal episode with resulting fall and left forehead/facial injury. Loss of consciousness. Initial encounter.  EXAM: CT HEAD WITHOUT CONTRAST  CT MAXILLOFACIAL WITHOUT CONTRAST  CT CERVICAL SPINE WITHOUT CONTRAST  TECHNIQUE: Multidetector CT imaging of the head, cervical spine, and maxillofacial structures were performed using the standard protocol without intravenous contrast. Multiplanar CT image reconstructions of the cervical spine and maxillofacial structures were also generated.  COMPARISON:  Head CT 03/17/2014.  FINDINGS: CT HEAD FINDINGS  There is no evidence of acute intracranial hemorrhage, mass lesion, brain edema or extra-axial fluid collection. The ventricles and subarachnoid spaces are prominent but stable. There is new ill-defined low-density along the left aspect of the tentorium on images 11 and 12, probably reflecting encephalomalacia. This localizes to the superior aspect of the left cerebellum on the reformatted images from the maxillofacial CT. Otherwise, there are stable chronic small vessel ischemic changes in the periventricular white matter. No acute cortical based infarct identified. Intracranial vascular calcifications noted.  The visualized paranasal sinuses, mastoid air cells and middle ears are clear. Left frontal scalp soft tissue injury noted. There is no evidence of underlying calvarial fracture.  CT MAXILLOFACIAL FINDINGS  Left supraorbital soft tissue laceration associated with mild pre orbital soft tissue swelling medially in the left orbit. There is no postseptal inflammation. The globes are intact. The extra-ocular muscles and optic nerves are intact. The paranasal sinuses are clear without air-fluid levels.   No evidence of acute facial fracture.  CT CERVICAL SPINE FINDINGS  The cervical alignment is normal. There is no evidence of acute fracture or traumatic subluxation. Paraspinal osteophytes are noted. There is ossification of the ligamentum nuchae.  No acute soft tissue findings are evident. There are diffuse calcifications of the carotid arteries bilaterally. Left pleural effusion and left perihilar airspace disease are partially imaged.  IMPRESSION: 1. Left supraorbital and periorbital soft tissue injury. No evidence of orbital hematoma or maxillofacial fracture. 2. New low-density in the left superior cerebellum, most likely representing encephalomalacia from an interval infarct. This could be subacute. No evidence of acute intracranial hemorrhage. 3. No evidence of acute cervical spine fracture, traumatic subluxation or static  signs of instability. 4. Left pleural effusion and left suprahilar airspace disease. Thoracic findings are further described on the concurrent chest CT.   Electronically Signed   By: Camie Patience M.D.   On: 08/17/2014 09:55   Ct Abdomen Pelvis W Contrast  08/17/2014   CLINICAL DATA:  81 year old male with syncopal episode, found down with loss of consciousness. Initial encounter. Current history of lung cancer.  EXAM: CT ANGIOGRAPHY CHEST  CT ABDOMEN AND PELVIS WITH CONTRAST  TECHNIQUE: Multidetector CT imaging of the chest was performed using the standard protocol during bolus administration of intravenous contrast. Multiplanar CT image reconstructions and MIPs were obtained to evaluate the vascular anatomy. Multidetector CT imaging of the abdomen and pelvis was performed using the standard protocol during bolus administration of intravenous contrast.  CONTRAST:  111mL OMNIPAQUE IOHEXOL 350 MG/ML SOLN  COMPARISON:  Portable chest radiograph 0658 hr today. Chest CTA 05/07/2014. PET-CT 03/08/2014.  FINDINGS: CTA CHEST FINDINGS  Good contrast bolus timing in the pulmonary arterial tree.  Positive bilateral pulmonary artery filling defects. Confluent right upper lobe pulmonary embolus. Confluent left lower lobe pulmonary embolus. Right greater than left main pulmonary artery clot. No saddle embolus.  RV / LV ratio = 1.2  Moderate layering left pleural effusion. No pericardial effusion. Trace layering right pleural effusion.  Atelectatic changes to some of the major airways. Compressive atelectasis throughout the left lung. Continued abnormal soft tissue at the left hilum, but now partially obscured by the left hilar pulmonary thrombus. Left lower lobe consolidation with air bronchograms is increased. Nodular left lingula peribronchovascular opacity is new since October.  Stable right lung markings since October.  Right chest porta cath. Negative thoracic inlet. Aorta and coronary arteries with extensive calcified plaque.  Stable osseous structures in the thorax.  CT ABDOMEN and PELVIS FINDINGS  No acute or suspicious osseous abnormality. Advanced degenerative changes at the right hip.  No pelvic free fluid. Diminutive bladder with layering density posteriorly on the left series 5, image 83. Stool in the rectum and sigmoid colon. Redundant sigmoid. Decompressed left colon, transverse colon and right colon. Diverticulosis of the right colon is stable. No active inflammation. No dilated small bowel. Stable mild gastric hiatal hernia. Decompressed stomach and duodenum. Stable liver, gallbladder, spleen, pancreas and right adrenal gland. Stable kidneys. The left adrenal nodule identified on 03/08/2014 enhances but is stable in size.  Portal venous system is patent. Extensive Aortoiliac calcified atherosclerosis noted. No abdominal free fluid. No free air.  Review of the MIP images confirms the above findings.  IMPRESSION: 1. Positive for acute PE with CT evidence of right heartstrain (RV/LV Ratio = 1.2) consistent with at least submassive (intermediate risk)PE. The presence of right heart strain has been  associated with anincreased risk of morbidity and mortality. Consultation with [facility] is recommended. 2. Moderate layering left pleural effusion is new since October. Left lower lobe consolidation which in this setting may be pulmonary infarct. Increase nodular left perihilar opacity. 3. Left hilar peribronchial mass known to be lung cancer persists, and partially obscured by the acute findings today. 4. Indeterminate left adrenal nodule is stable. 5. Small left bladder calculus is new since 2015. No other acute findings in the abdomen or pelvis. Critical Value/emergent results were called by telephone at the time of interpretation on 08/17/2014 at 10:06 am to Dr. Ezequiel Essex , who verbally acknowledged these results.   Electronically Signed   By: Lars Pinks M.D.   On: 08/17/2014 10:07  Dg Chest Portable 1 View  08/17/2014   CLINICAL DATA:  81 year old male with loss of consciousness, head injury. Atrial fibrillation Initial encounter. Current history of lung cancer.  EXAM: PORTABLE CHEST - 1 VIEW  COMPARISON:  08/09/2014 and earlier.  FINDINGS: Portable AP semi upright view at 0658 hrs. Stable right chest porta cath. Stable lung volumes. No pneumothorax. No pulmonary edema. Bibasilar pleural scarring or small effusions, unchanged. Nodular opacity about the left hilum also stable. No areas of worsening ventilation. Stable cardiac size and mediastinal contours.  IMPRESSION: 1. Stable radiographic appearance of the chest. See recommendations on the recent radiographs of 08/09/2014. 2. No new cardiopulmonary abnormality.   Electronically Signed   By: Lars Pinks M.D.   On: 08/17/2014 07:20   Ct Maxillofacial Wo Cm  08/17/2014   CLINICAL DATA:  Syncopal episode with resulting fall and left forehead/facial injury. Loss of consciousness. Initial encounter.  EXAM: CT HEAD WITHOUT CONTRAST  CT MAXILLOFACIAL WITHOUT CONTRAST  CT CERVICAL SPINE WITHOUT CONTRAST  TECHNIQUE: Multidetector CT  imaging of the head, cervical spine, and maxillofacial structures were performed using the standard protocol without intravenous contrast. Multiplanar CT image reconstructions of the cervical spine and maxillofacial structures were also generated.  COMPARISON:  Head CT 03/17/2014.  FINDINGS: CT HEAD FINDINGS  There is no evidence of acute intracranial hemorrhage, mass lesion, brain edema or extra-axial fluid collection. The ventricles and subarachnoid spaces are prominent but stable. There is new ill-defined low-density along the left aspect of the tentorium on images 11 and 12, probably reflecting encephalomalacia. This localizes to the superior aspect of the left cerebellum on the reformatted images from the maxillofacial CT. Otherwise, there are stable chronic small vessel ischemic changes in the periventricular white matter. No acute cortical based infarct identified. Intracranial vascular calcifications noted.  The visualized paranasal sinuses, mastoid air cells and middle ears are clear. Left frontal scalp soft tissue injury noted. There is no evidence of underlying calvarial fracture.  CT MAXILLOFACIAL FINDINGS  Left supraorbital soft tissue laceration associated with mild pre orbital soft tissue swelling medially in the left orbit. There is no postseptal inflammation. The globes are intact. The extra-ocular muscles and optic nerves are intact. The paranasal sinuses are clear without air-fluid levels.  No evidence of acute facial fracture.  CT CERVICAL SPINE FINDINGS  The cervical alignment is normal. There is no evidence of acute fracture or traumatic subluxation. Paraspinal osteophytes are noted. There is ossification of the ligamentum nuchae.  No acute soft tissue findings are evident. There are diffuse calcifications of the carotid arteries bilaterally. Left pleural effusion and left perihilar airspace disease are partially imaged.  IMPRESSION: 1. Left supraorbital and periorbital soft tissue injury. No  evidence of orbital hematoma or maxillofacial fracture. 2. New low-density in the left superior cerebellum, most likely representing encephalomalacia from an interval infarct. This could be subacute. No evidence of acute intracranial hemorrhage. 3. No evidence of acute cervical spine fracture, traumatic subluxation or static signs of instability. 4. Left pleural effusion and left suprahilar airspace disease. Thoracic findings are further described on the concurrent chest CT.   Electronically Signed   By: Camie Patience M.D.   On: 08/17/2014 09:55   US Thoracentesis Asp Pleural Space W/img Guide  08/18/2014   CLINICAL DATA:  Shortness of breath, pulmonary embolus, lung cancer, left-sided pleural effusion. Request diagnostic and therapeutic thoracentesis.  EXAM: ULTRASOUND GUIDED LEFT THORACENTESIS  COMPARISON:  None.  PROCEDURE: An ultrasound guided thoracentesis was thoroughly discussed with the patient  and questions answered. The benefits, risks, alternatives and complications were also discussed. The patient understands and wishes to proceed with the procedure. Written consent was obtained.  Ultrasound was performed to localize and mark an adequate pocket of fluid in the left chest. The area was then prepped and draped in the normal sterile fashion. 1% Lidocaine was used for local anesthesia. Under ultrasound guidance a 19 gauge Yueh catheter was introduced. Thoracentesis was performed. The catheter was removed and a dressing applied.  COMPLICATIONS: None  FINDINGS: A total of approximately 750 mL of clear, amber colored fluid was removed. A fluid sample wassent for laboratory analysis.  IMPRESSION: Successful ultrasound guided left thoracentesis yielding 750 mL of pleural fluid.  Read by: Ascencion Dike PA-C   Electronically Signed   By: Markus Daft M.D.   On: 08/18/2014 11:48     Microbiology: No results found for this or any previous visit (from the past 240 hour(s)).   Labs: Basic Metabolic  Panel:  Recent Labs Lab 08/17/14 0706 08/17/14 0752 08/18/14 0518  NA 135 138 135  K 3.2* 3.3* 5.3*  CL 103 103 105  CO2 23  --  24  GLUCOSE 208* 206* 155*  BUN 9 13 10   CREATININE 1.48* 1.30 1.46*  CALCIUM 7.7*  --  8.4   Liver Function Tests:  Recent Labs Lab 08/17/14 0706  AST 22  ALT 18  ALKPHOS 108  BILITOT 0.6  PROT 5.7*  ALBUMIN 2.0*   No results for input(s): LIPASE, AMYLASE in the last 168 hours. No results for input(s): AMMONIA in the last 168 hours. CBC:  Recent Labs Lab 08/17/14 0706 08/17/14 0752 08/18/14 0518 08/19/14 0508  WBC 10.4  --  15.8* 12.3*  NEUTROABS 7.7  --   --   --   HGB 13.3 18.0* 12.8* 12.0*  HCT 40.8 53.0* 40.7 38.1*  MCV 95.1  --  96.2 95.0  PLT PLATELET CLUMPS NOTED ON SMEAR, UNABLE TO ESTIMATE  --  PLATELET CLUMPS NOTED ON SMEAR, COUNT APPEARS DECREASED PLATELET CLUMPING, SUGGEST RECOLLECTION OF SAMPLE IN CITRATE TUBE.   Cardiac Enzymes: No results for input(s): CKTOTAL, CKMB, CKMBINDEX, TROPONINI in the last 168 hours. BNP: Invalid input(s): POCBNP CBG:  Recent Labs Lab 08/18/14 0559 08/18/14 1210 08/18/14 1706 08/18/14 2109 08/19/14 0640  GLUCAP 154* 221* 163* 117* 114*    Time coordinating discharge:  Greater than 30 minutes  Signed:  Issa Kosmicki, DO [facility] Pager: [phone] 08/19/2014, 9:54 AM

## 2014-08-24 ENCOUNTER — Other Ambulatory Visit: Payer: Self-pay | Admitting: *Deleted

## 2014-08-29 ENCOUNTER — Ambulatory Visit: Payer: Medicare HMO | Admitting: Hematology & Oncology

## 2014-08-29 ENCOUNTER — Ambulatory Visit: Payer: Medicare HMO

## 2014-08-29 ENCOUNTER — Other Ambulatory Visit: Payer: Medicare HMO | Admitting: Lab

## 2014-09-07 ENCOUNTER — Encounter: Payer: Self-pay | Admitting: *Deleted

## 2014-09-07 NOTE — Progress Notes (Signed)
Received notice from [facility] that patient passed away this morning at 8:28am. Notified Dr Marin Olp

## 2014-09-13 DEATH — deceased

## 2014-09-30 ENCOUNTER — Encounter: Payer: Self-pay | Admitting: *Deleted

## 2014-10-06 ENCOUNTER — Other Ambulatory Visit: Payer: Self-pay | Admitting: Family

## 2014-10-14 DEATH — deceased

## 2015-09-16 IMAGING — CR DG CHEST 1V PORT
1 series · 1 of 1 positions shown · non-contrast
Comparison: 10/07/2012.

CLINICAL DATA: Patient complaining of shortness of breath today.
History of COPD. History of lung carcinoma, coronary artery disease
and hypertension. Ex-smoker.

EXAM:
PORTABLE CHEST - 1 VIEW

[AP]
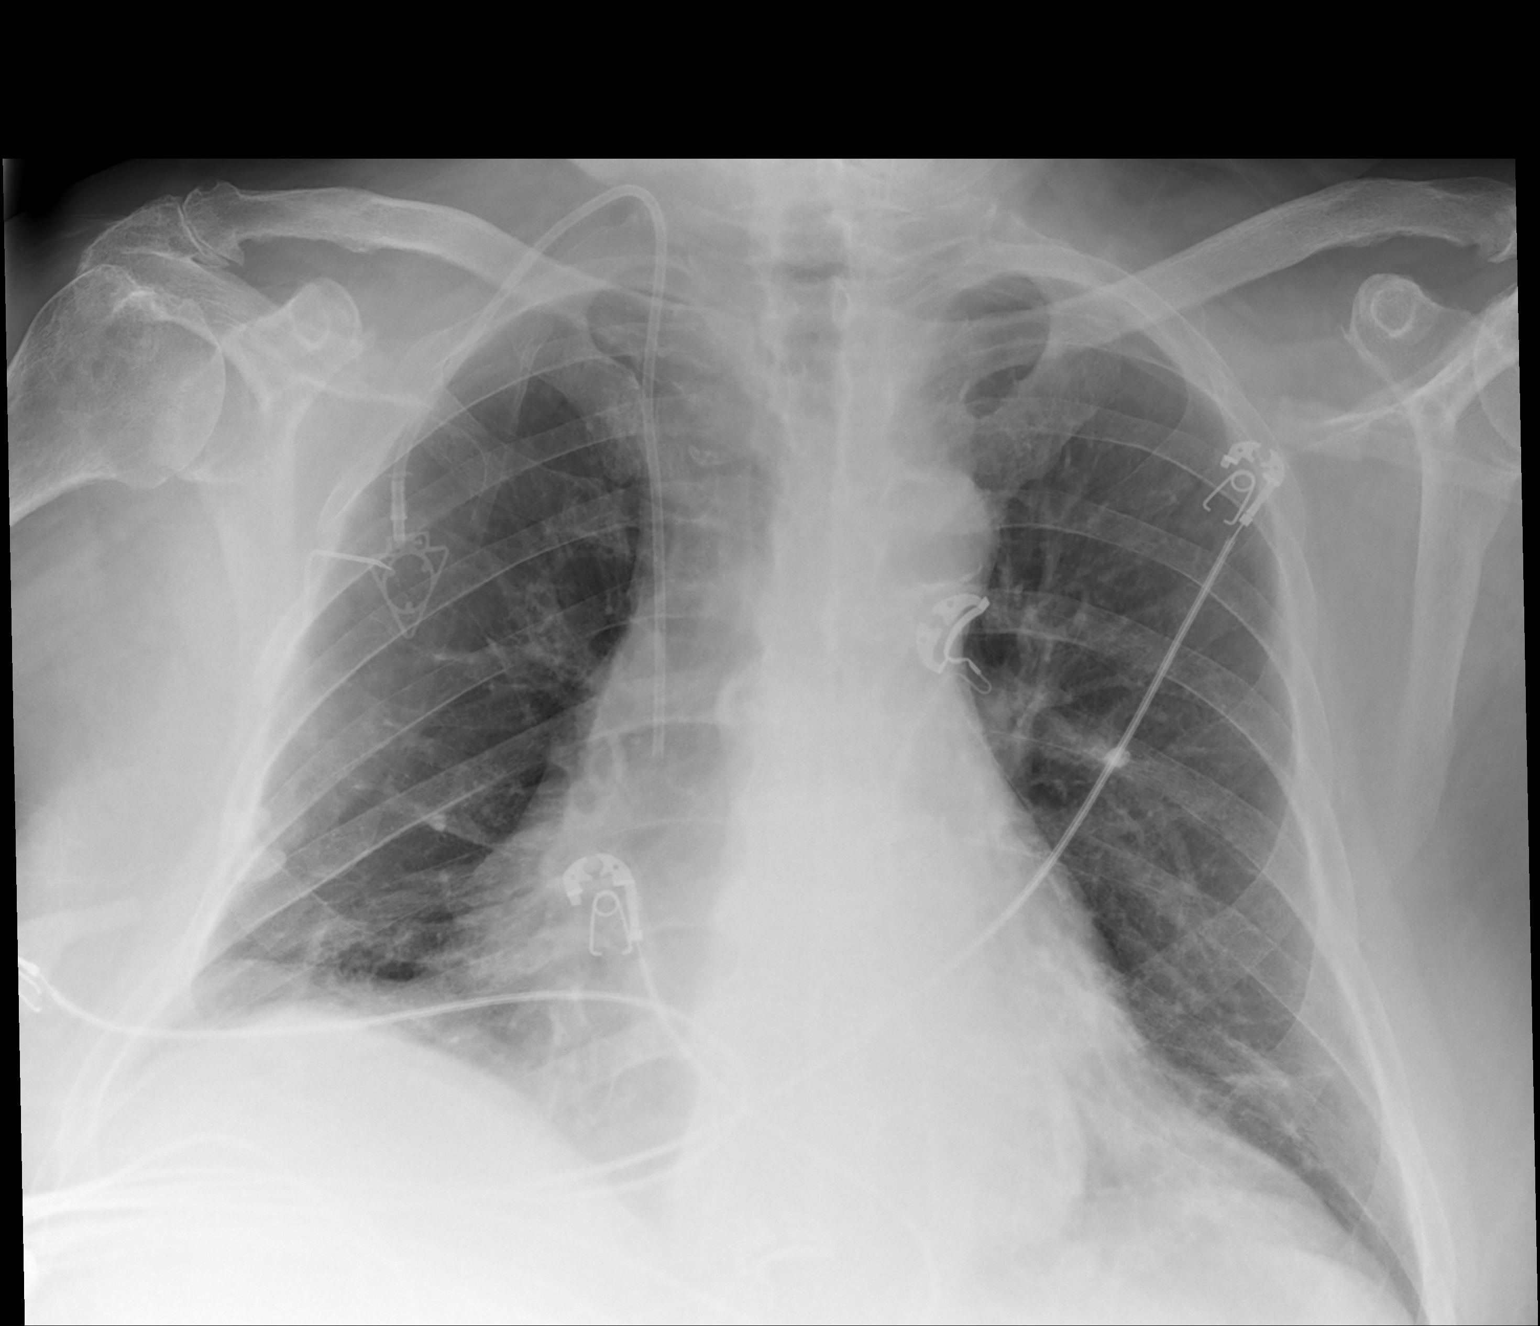

[1 of 1 positions shown; findings below may reference images not displayed]

FINDINGS: Cardiac silhouette is mildly enlarged. No mediastinal or hilar
masses.

No lung consolidation or edema.  No mass or discrete nodule.

Opacity superimpose are cardiac silhouette may be due to the aorta
or a small hiatal hernia.

No convincing pleural effusion.  No pneumothorax.

Right anterior chest wall power Port-A-Cath has its tip in the lower
superior vena cava.
IMPRESSION: No acute cardiopulmonary disease.

## 2015-09-16 IMAGING — CT CT ANGIO CHEST
1 of 2 series · 18 of 32 positions shown · IV contrast (OMNIPAQUE)
Comparison: 02/23/2014

CLINICAL DATA: History of stage IIIB squamous cell lung cancer,
undergoing chemotherapy and radiation. Generalized weakness and
lightheadedness. Shortness of breath, tachycardia. Evaluate for
pulmonary embolus.

EXAM:
CT ANGIOGRAPHY CHEST WITH CONTRAST
TECHNIQUE: Multidetector CT imaging of the chest was performed using the
standard protocol during bolus administration of intravenous
contrast. Multiplanar CT image reconstructions and MIPs were
obtained to evaluate the vascular anatomy.
CONTRAST:  80mL OMNIPAQUE IOHEXOL 350 MG/ML SOLN

[Series 6: thins · axial · 0.84mm/px · z∈[-310,-46]mm · 18 of 295 slices shown]
[im 15/295  lung]
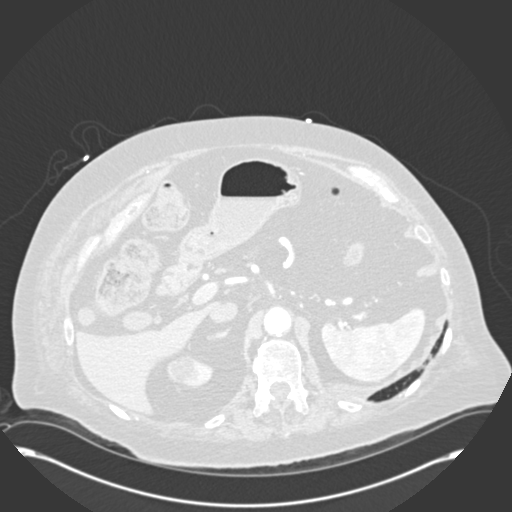
[im 30/295  mediastinal]
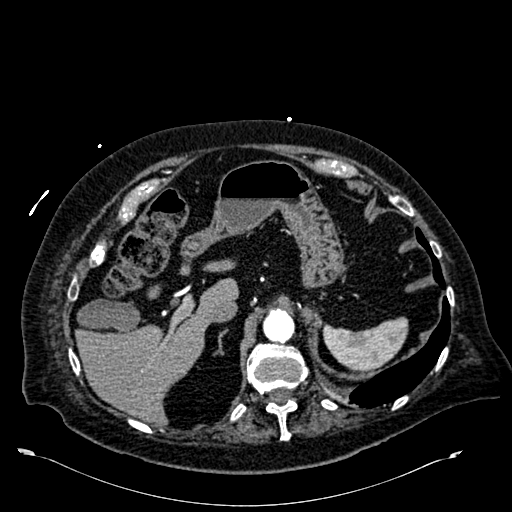
[im 59/295  lung]
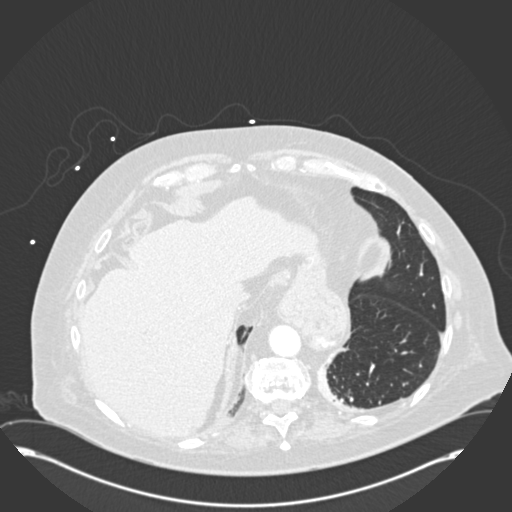
[im 74/295  mediastinal]
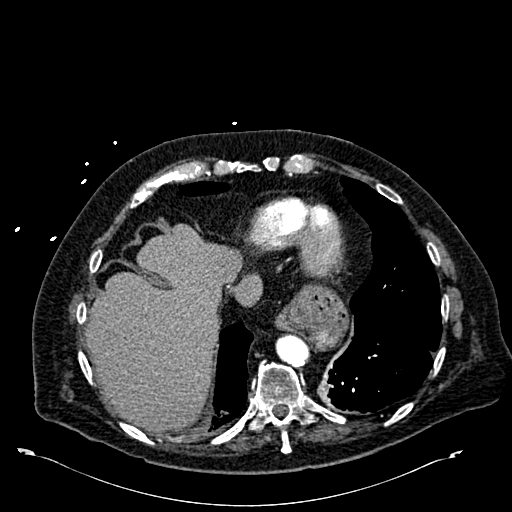
[im 89/295  lung]
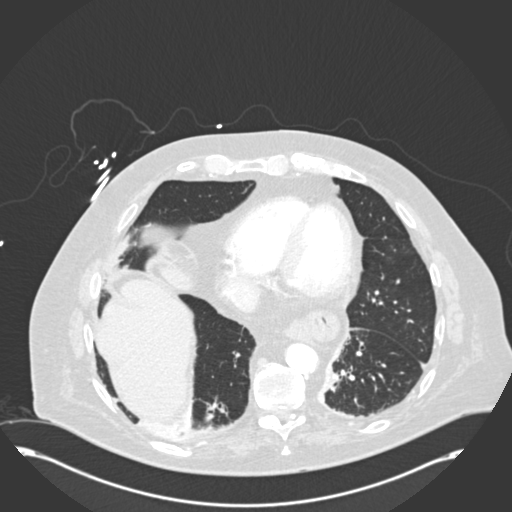
[im 99/295  mediastinal]
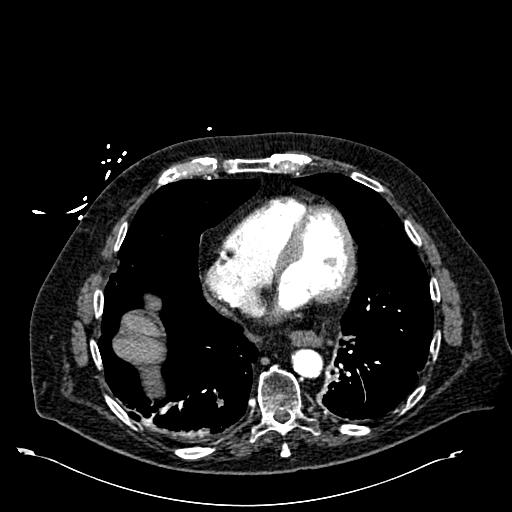
[im 103/295  lung]
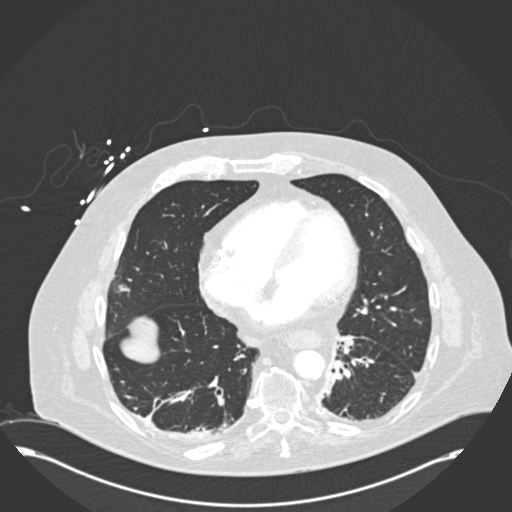
[im 133/295  mediastinal]
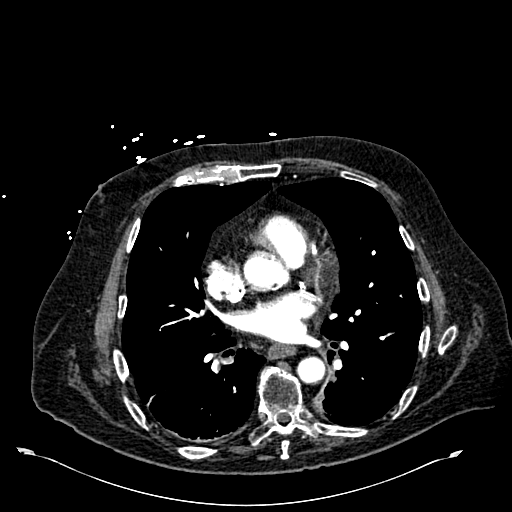
[im 138/295  lung]
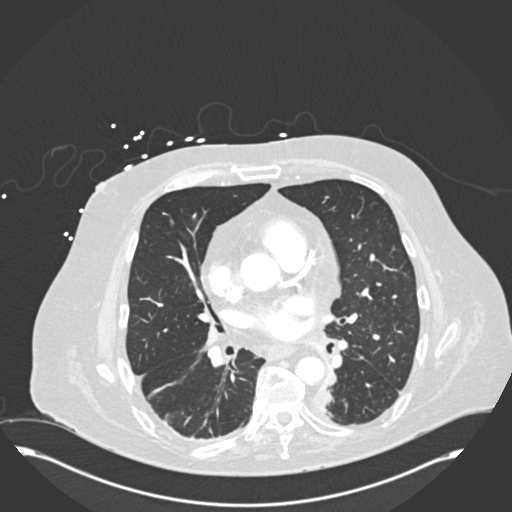
[im 148/295  mediastinal]
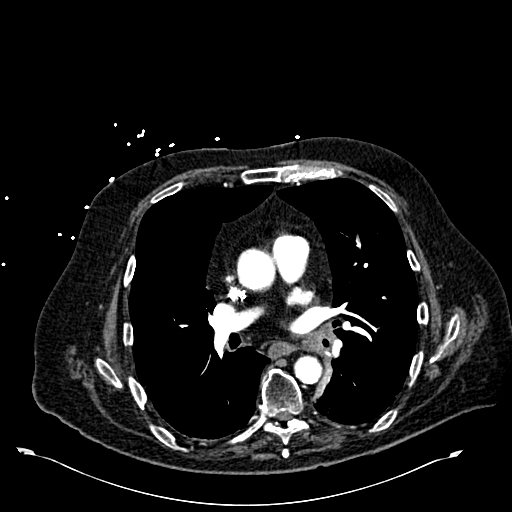
[im 162/295  lung]
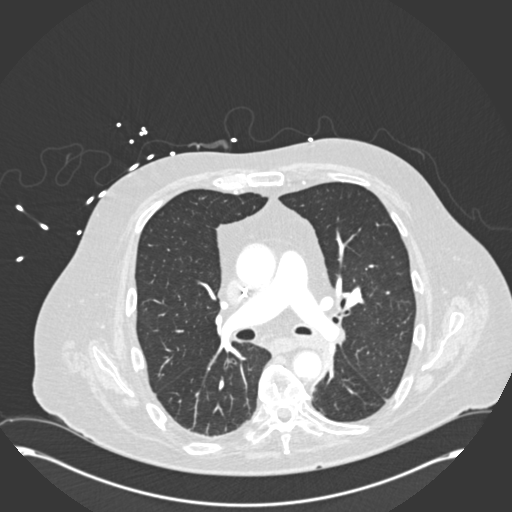
[im 192/295  mediastinal]
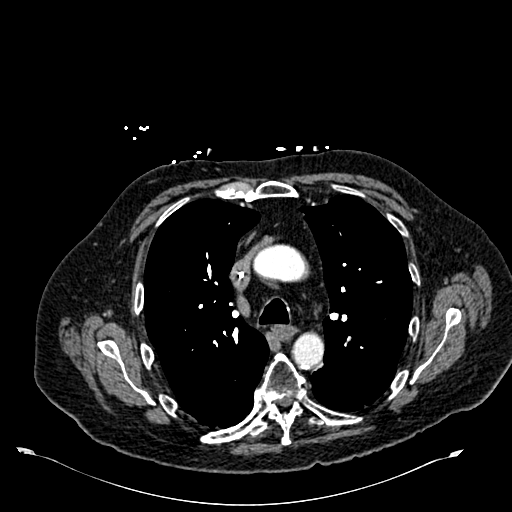
[im 197/295  lung]
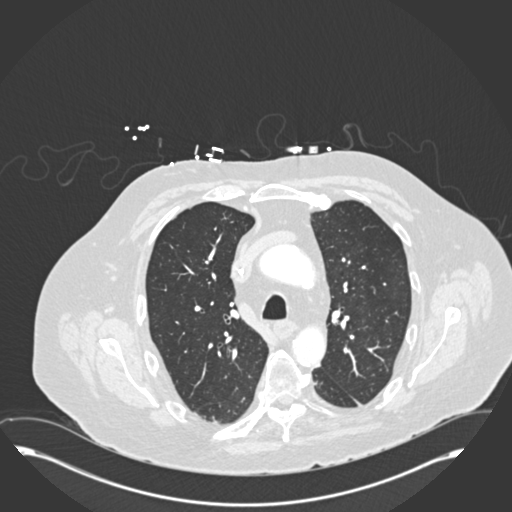
[im 206/295  mediastinal]
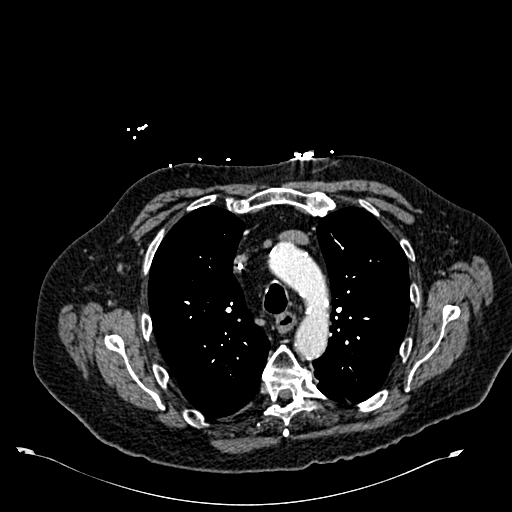
[im 221/295  lung]
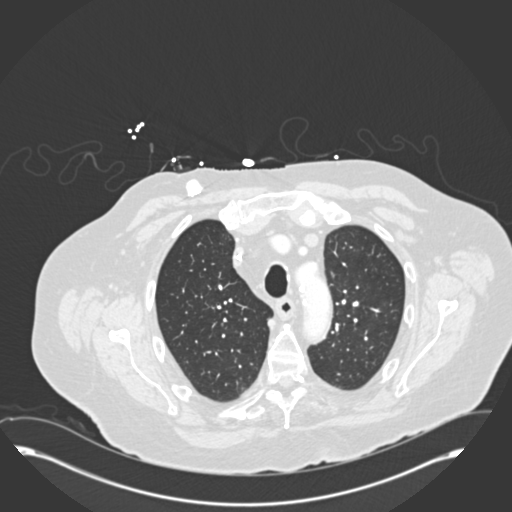
[im 236/295  mediastinal]
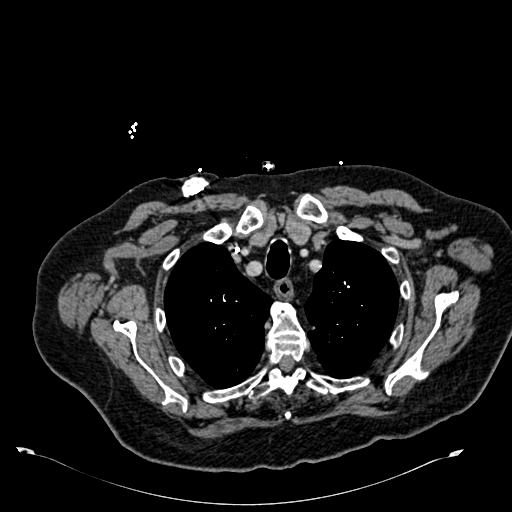
[im 265/295  lung]
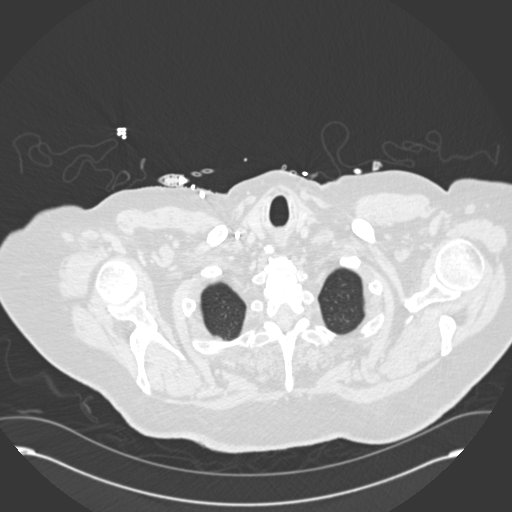
[im 280/295  mediastinal]
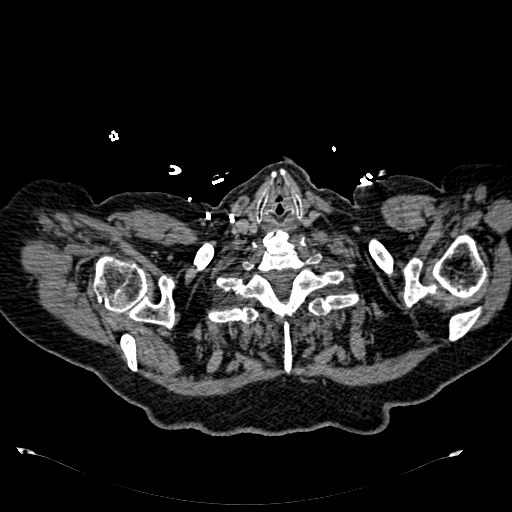

[18 of 32 positions shown; findings below may reference images not displayed]

FINDINGS: No filling defects in the pulmonary arteries to suggest pulmonary
emboli. Stable moderate-sized hiatal hernia. Heart is normal size.
Aorta is normal caliber. Densely calcified coronary arteries.
Moderate calcifications throughout the aorta which is mildly
tortuous.

No mediastinal, hilar, or axillary adenopathy. Stable small left
hilar lymph node measuring 9 mm in short axis diameter on image 43.
Chest wall soft tissues are unremarkable.

Significant improvement in left lower lobe atelectasis/collapse
since prior study. The central left hilar/infrahilar mass much
improved and difficult to measure. This soft tissue surrounds the
left lower lobe bronchus on image 50, but again is improved.

Right basilar atelectasis. Small subpleural nodule in the left upper
lobe measures 3 mm on image 41, stable. No new or enlarging
pulmonary nodules. Trace bilateral pleural effusions. No pericardial
effusion.

Imaging into the upper abdomen shows no acute findings.

No acute bony abnormality or focal bone lesion. Changes of
ankylosing spondylitis again noted within the thoracic spine.

Review of the MIP images confirms the above findings.
IMPRESSION: Improving left hilar/ infrahilar obstructing mass. Mild abnormal
soft tissue continues does surround the left lower lobe bronchus,
but there has improved aeration with decreasing atelectasis in the
left lower lung. Stable small/borderline size left hilar lymph node.

Stable posterior subpleural nodule in the left upper lobe, 3 mm.

Right basilar atelectasis.

Severe coronary artery disease.

Stable hiatal hernia.
